# Patient Record
Sex: Female | Born: 1945 | Race: Black or African American | Hispanic: No | Smoking: Former smoker
Health system: Southern US, Community
[De-identification: ages and names within clinical notes are randomized; demographics above are authoritative.]

## PROBLEM LIST (undated history)

## (undated) DIAGNOSIS — E7800 Pure hypercholesterolemia, unspecified: Secondary | ICD-10-CM

## (undated) DIAGNOSIS — I10 Essential (primary) hypertension: Secondary | ICD-10-CM

## (undated) DIAGNOSIS — R519 Headache, unspecified: Secondary | ICD-10-CM

## (undated) DIAGNOSIS — I639 Cerebral infarction, unspecified: Secondary | ICD-10-CM

## (undated) DIAGNOSIS — R51 Headache: Secondary | ICD-10-CM

## (undated) DIAGNOSIS — E119 Type 2 diabetes mellitus without complications: Secondary | ICD-10-CM

## (undated) DIAGNOSIS — R7881 Bacteremia: Secondary | ICD-10-CM

## (undated) DIAGNOSIS — F419 Anxiety disorder, unspecified: Secondary | ICD-10-CM

## (undated) DIAGNOSIS — D649 Anemia, unspecified: Secondary | ICD-10-CM

## (undated) DIAGNOSIS — I96 Gangrene, not elsewhere classified: Secondary | ICD-10-CM

## (undated) DIAGNOSIS — F32A Depression, unspecified: Secondary | ICD-10-CM

## (undated) DIAGNOSIS — L89159 Pressure ulcer of sacral region, unspecified stage: Secondary | ICD-10-CM

## (undated) DIAGNOSIS — Z8489 Family history of other specified conditions: Secondary | ICD-10-CM

## (undated) DIAGNOSIS — F329 Major depressive disorder, single episode, unspecified: Secondary | ICD-10-CM

## (undated) DIAGNOSIS — J189 Pneumonia, unspecified organism: Secondary | ICD-10-CM

## (undated) DIAGNOSIS — G47419 Narcolepsy without cataplexy: Secondary | ICD-10-CM

## (undated) DIAGNOSIS — M719 Bursopathy, unspecified: Secondary | ICD-10-CM

## (undated) HISTORY — DX: Anemia, unspecified: D64.9

## (undated) HISTORY — DX: Pressure ulcer of sacral region, unspecified stage: L89.159

## (undated) HISTORY — DX: Gangrene, not elsewhere classified: I96

## (undated) HISTORY — DX: Essential (primary) hypertension: I10

## (undated) HISTORY — DX: Bacteremia: R78.81

## (undated) HISTORY — DX: Cerebral infarction, unspecified: I63.9

---

## 1999-08-23 ENCOUNTER — Encounter: Payer: Self-pay | Admitting: Internal Medicine

## 1999-08-23 ENCOUNTER — Ambulatory Visit (HOSPITAL_COMMUNITY): Admission: RE | Admit: 1999-08-23 | Discharge: 1999-08-23 | Payer: Self-pay | Admitting: Internal Medicine

## 2001-03-06 ENCOUNTER — Emergency Department (HOSPITAL_COMMUNITY): Admission: EM | Admit: 2001-03-06 | Discharge: 2001-03-06 | Payer: Self-pay | Admitting: Emergency Medicine

## 2004-02-15 ENCOUNTER — Emergency Department (HOSPITAL_COMMUNITY): Admission: EM | Admit: 2004-02-15 | Discharge: 2004-02-16 | Payer: Self-pay | Admitting: Emergency Medicine

## 2006-01-02 ENCOUNTER — Emergency Department (HOSPITAL_COMMUNITY): Admission: EM | Admit: 2006-01-02 | Discharge: 2006-01-02 | Payer: Self-pay | Admitting: Family Medicine

## 2006-01-05 ENCOUNTER — Emergency Department (HOSPITAL_COMMUNITY): Admission: EM | Admit: 2006-01-05 | Discharge: 2006-01-05 | Payer: Self-pay | Admitting: Family Medicine

## 2009-04-21 DIAGNOSIS — J189 Pneumonia, unspecified organism: Secondary | ICD-10-CM

## 2009-04-21 HISTORY — DX: Pneumonia, unspecified organism: J18.9

## 2009-04-27 ENCOUNTER — Ambulatory Visit: Payer: Self-pay | Admitting: Internal Medicine

## 2009-04-27 ENCOUNTER — Ambulatory Visit: Payer: Self-pay | Admitting: Cardiology

## 2009-04-27 ENCOUNTER — Ambulatory Visit: Payer: Self-pay | Admitting: Pulmonary Disease

## 2009-04-27 ENCOUNTER — Inpatient Hospital Stay (HOSPITAL_COMMUNITY): Admission: EM | Admit: 2009-04-27 | Discharge: 2009-05-08 | Payer: Self-pay | Admitting: Emergency Medicine

## 2009-04-30 ENCOUNTER — Encounter (INDEPENDENT_AMBULATORY_CARE_PROVIDER_SITE_OTHER): Payer: Self-pay | Admitting: Internal Medicine

## 2009-05-08 ENCOUNTER — Encounter (INDEPENDENT_AMBULATORY_CARE_PROVIDER_SITE_OTHER): Payer: Self-pay | Admitting: *Deleted

## 2009-05-08 DIAGNOSIS — E1151 Type 2 diabetes mellitus with diabetic peripheral angiopathy without gangrene: Secondary | ICD-10-CM | POA: Insufficient documentation

## 2009-05-08 DIAGNOSIS — F411 Generalized anxiety disorder: Secondary | ICD-10-CM

## 2009-05-08 DIAGNOSIS — I10 Essential (primary) hypertension: Secondary | ICD-10-CM

## 2009-05-17 ENCOUNTER — Ambulatory Visit: Payer: Self-pay | Admitting: Internal Medicine

## 2009-05-17 ENCOUNTER — Encounter (INDEPENDENT_AMBULATORY_CARE_PROVIDER_SITE_OTHER): Payer: Self-pay | Admitting: *Deleted

## 2009-05-17 ENCOUNTER — Encounter: Payer: Self-pay | Admitting: Internal Medicine

## 2009-05-17 DIAGNOSIS — J155 Pneumonia due to Escherichia coli: Secondary | ICD-10-CM

## 2009-05-17 DIAGNOSIS — D649 Anemia, unspecified: Secondary | ICD-10-CM

## 2009-05-18 ENCOUNTER — Encounter: Payer: Self-pay | Admitting: Internal Medicine

## 2009-05-22 DIAGNOSIS — D759 Disease of blood and blood-forming organs, unspecified: Secondary | ICD-10-CM | POA: Insufficient documentation

## 2009-05-22 LAB — CONVERTED CEMR LAB
Alkaline Phosphatase: 69 units/L (ref 39–117)
BUN: 15 mg/dL (ref 6–23)
GFR calc Af Amer: >60 mL/min (ref >60)
GFR calc non Af Amer: >60 mL/min (ref >60)
Glucose, Bld: 147 mg/dL — ABNORMAL HIGH (ref 70–99)
HCT: 38 % (ref 36.0–46.0)
Hemoglobin: 12.6 g/dL (ref 12.0–15.0)
Platelets: 552 10*3/uL — ABNORMAL HIGH (ref 150–400)
Total Bilirubin: 0.3 mg/dL (ref 0.3–1.2)
WBC: 7.6 10*3/uL (ref 4.0–10.5)

## 2009-05-24 ENCOUNTER — Encounter (INDEPENDENT_AMBULATORY_CARE_PROVIDER_SITE_OTHER): Payer: Self-pay | Admitting: *Deleted

## 2009-05-24 ENCOUNTER — Ambulatory Visit: Payer: Self-pay | Admitting: Internal Medicine

## 2009-05-24 LAB — CONVERTED CEMR LAB: Blood Glucose, Home Monitor: 2 mg/dL

## 2009-07-19 ENCOUNTER — Encounter: Payer: Self-pay | Admitting: Internal Medicine

## 2009-08-14 ENCOUNTER — Telehealth (INDEPENDENT_AMBULATORY_CARE_PROVIDER_SITE_OTHER): Payer: Self-pay | Admitting: *Deleted

## 2009-08-30 ENCOUNTER — Ambulatory Visit (HOSPITAL_COMMUNITY): Admission: RE | Admit: 2009-08-30 | Discharge: 2009-08-30 | Payer: Self-pay | Admitting: Internal Medicine

## 2009-08-30 ENCOUNTER — Ambulatory Visit: Payer: Self-pay | Admitting: Internal Medicine

## 2009-08-30 DIAGNOSIS — M25559 Pain in unspecified hip: Secondary | ICD-10-CM

## 2009-08-30 DIAGNOSIS — M25519 Pain in unspecified shoulder: Secondary | ICD-10-CM

## 2009-08-30 DIAGNOSIS — R799 Abnormal finding of blood chemistry, unspecified: Secondary | ICD-10-CM

## 2009-08-30 LAB — CONVERTED CEMR LAB
Basophils Absolute: 0 10*3/uL (ref 0.0–0.1)
Basophils Relative: 0 % (ref 0–1)
Blood Glucose, Fingerstick: 205
Calcium: 9.4 mg/dL (ref 8.4–10.5)
Glucose, Bld: 170 mg/dL — ABNORMAL HIGH (ref 70–99)
Hemoglobin: 13 g/dL (ref 12.0–15.0)
Hgb A1c MFr Bld: 7.1 %
Lymphocytes Relative: 55 % — ABNORMAL HIGH (ref 12–46)
Monocytes Absolute: 0.5 10*3/uL (ref 0.1–1.0)
Neutro Abs: 2.7 10*3/uL (ref 1.7–7.7)
Neutrophils Relative %: 36 % — ABNORMAL LOW (ref 43–77)
Potassium: 4.1 meq/L (ref 3.5–5.3)
RBC: 4.31 M/uL (ref 3.87–5.11)
RDW: 13.2 % (ref 11.5–15.5)
Sodium: 143 meq/L (ref 135–145)

## 2009-09-06 ENCOUNTER — Encounter: Payer: Self-pay | Admitting: Internal Medicine

## 2009-10-22 ENCOUNTER — Telehealth: Payer: Self-pay | Admitting: Internal Medicine

## 2011-03-28 LAB — GLUCOSE, CAPILLARY: Glucose-Capillary: 205 mg/dL — ABNORMAL HIGH (ref 70–99)

## 2011-04-01 LAB — COMPREHENSIVE METABOLIC PANEL WITH GFR
ALT: 15 U/L (ref 0–35)
AST: 27 U/L (ref 0–37)
Albumin: 3.4 g/dL — ABNORMAL LOW (ref 3.5–5.2)
Alkaline Phosphatase: 86 U/L (ref 39–117)
BUN: 11 mg/dL (ref 6–23)
CO2: 23 meq/L (ref 19–32)
Calcium: 8.7 mg/dL (ref 8.4–10.5)
Chloride: 99 meq/L (ref 96–112)
Creatinine, Ser: 0.9 mg/dL (ref 0.4–1.2)
GFR calc non Af Amer: >60 mL/min
Glucose, Bld: 408 mg/dL — ABNORMAL HIGH (ref 70–99)
Potassium: 3.3 meq/L — ABNORMAL LOW (ref 3.5–5.1)
Sodium: 135 meq/L (ref 135–145)
Total Bilirubin: 1.3 mg/dL — ABNORMAL HIGH (ref 0.3–1.2)
Total Protein: 6.7 g/dL (ref 6.0–8.3)

## 2011-04-01 LAB — BASIC METABOLIC PANEL
BUN: 8 mg/dL (ref 6–23)
BUN: 10 mg/dL (ref 6–23)
BUN: 10 mg/dL (ref 6–23)
BUN: 11 mg/dL (ref 6–23)
BUN: 13 mg/dL (ref 6–23)
CO2: 30 mEq/L (ref 19–32)
CO2: 26 mEq/L (ref 19–32)
CO2: 26 mEq/L (ref 19–32)
CO2: 28 mEq/L (ref 19–32)
Calcium: 8.9 mg/dL (ref 8.4–10.5)
Calcium: 8 mg/dL — ABNORMAL LOW (ref 8.4–10.5)
Calcium: 8 mg/dL — ABNORMAL LOW (ref 8.4–10.5)
Calcium: 8.3 mg/dL — ABNORMAL LOW (ref 8.4–10.5)
Chloride: 96 mEq/L (ref 96–112)
Creatinine, Ser: 0.47 mg/dL (ref 0.4–1.2)
Creatinine, Ser: 0.59 mg/dL (ref 0.4–1.2)
Creatinine, Ser: 0.59 mg/dL (ref 0.4–1.2)
Creatinine, Ser: 0.73 mg/dL (ref 0.4–1.2)
GFR calc Af Amer: >60 mL/min (ref >60)
GFR calc Af Amer: >60 mL/min (ref >60)
GFR calc Af Amer: >60 mL/min (ref >60)
GFR calc Af Amer: >60 mL/min (ref >60)
GFR calc non Af Amer: >60 mL/min (ref >60)
GFR calc non Af Amer: >60 mL/min (ref >60)
GFR calc non Af Amer: >60 mL/min (ref >60)
GFR calc non Af Amer: >60 mL/min (ref >60)
GFR calc non Af Amer: >60 mL/min (ref >60)
GFR calc non Af Amer: >60 mL/min (ref >60)
GFR calc non Af Amer: >60 mL/min (ref >60)
Glucose, Bld: 116 mg/dL — ABNORMAL HIGH (ref 70–99)
Glucose, Bld: 124 mg/dL — ABNORMAL HIGH (ref 70–99)
Glucose, Bld: 142 mg/dL — ABNORMAL HIGH (ref 70–99)
Glucose, Bld: 147 mg/dL — ABNORMAL HIGH (ref 70–99)
Glucose, Bld: 149 mg/dL — ABNORMAL HIGH (ref 70–99)
Potassium: 4.7 mEq/L (ref 3.5–5.1)
Potassium: 3.2 mEq/L — ABNORMAL LOW (ref 3.5–5.1)
Potassium: 3.5 mEq/L (ref 3.5–5.1)
Potassium: 3.6 mEq/L (ref 3.5–5.1)
Potassium: 3.7 mEq/L (ref 3.5–5.1)
Potassium: 3.7 mEq/L (ref 3.5–5.1)
Sodium: 134 mEq/L — ABNORMAL LOW (ref 135–145)
Sodium: 137 mEq/L (ref 135–145)
Sodium: 139 mEq/L (ref 135–145)
Sodium: 140 mEq/L (ref 135–145)
Sodium: 142 mEq/L (ref 135–145)

## 2011-04-01 LAB — DIFFERENTIAL
Basophils Absolute: 0 10*3/uL (ref 0.0–0.1)
Basophils Absolute: 0 10*3/uL (ref 0.0–0.1)
Basophils Absolute: 0.1 K/uL (ref 0.0–0.1)
Basophils Relative: 0 % (ref 0–1)
Basophils Relative: 1 % (ref 0–1)
Eosinophils Absolute: 0 K/uL (ref 0.0–0.7)
Eosinophils Absolute: 0.3 10*3/uL (ref 0.0–0.7)
Eosinophils Relative: 0 % (ref 0–5)
Eosinophils Relative: 1 % (ref 0–5)
Eosinophils Relative: 2 % (ref 0–5)
Lymphocytes Relative: 4 % — ABNORMAL LOW (ref 12–46)
Lymphs Abs: 0.7 K/uL (ref 0.7–4.0)
Monocytes Absolute: 0.4 K/uL (ref 0.1–1.0)
Monocytes Absolute: 1.9 10*3/uL — ABNORMAL HIGH (ref 0.1–1.0)
Monocytes Relative: 2 % — ABNORMAL LOW (ref 3–12)
Monocytes Relative: 4 % (ref 3–12)
Neutro Abs: 17.2 K/uL — ABNORMAL HIGH (ref 1.7–7.7)
Neutrophils Relative %: 86 % — ABNORMAL HIGH (ref 43–77)
Neutrophils Relative %: 93 % — ABNORMAL HIGH (ref 43–77)

## 2011-04-01 LAB — CARDIAC PANEL(CRET KIN+CKTOT+MB+TROPI)
CK, MB: 1.6 ng/mL (ref 0.3–4.0)
CK, MB: 2.2 ng/mL (ref 0.3–4.0)
Relative Index: 1.5 (ref 0.0–2.5)
Total CK: 112 U/L (ref 7–177)
Total CK: 131 U/L (ref 7–177)
Total CK: 146 U/L (ref 7–177)
Total CK: 69 U/L (ref 7–177)
Troponin I: 0.04 ng/mL (ref 0.00–0.06)
Troponin I: 0.12 ng/mL — ABNORMAL HIGH (ref 0.00–0.06)

## 2011-04-01 LAB — URINALYSIS, ROUTINE W REFLEX MICROSCOPIC
Glucose, UA: 250 mg/dL — AB
Glucose, UA: >1000 mg/dL — AB
Hgb urine dipstick: NEGATIVE
Hgb urine dipstick: NEGATIVE
Ketones, ur: 15 mg/dL — AB
Ketones, ur: 15 mg/dL — AB
Leukocytes, UA: NEGATIVE
Leukocytes, UA: NEGATIVE
Nitrite: NEGATIVE
Nitrite: NEGATIVE
Protein, ur: 30 mg/dL — AB
Protein, ur: 30 mg/dL — AB
Specific Gravity, Urine: 1.026 (ref 1.005–1.030)
Specific Gravity, Urine: 1.035 — ABNORMAL HIGH (ref 1.005–1.030)
Urobilinogen, UA: 1 mg/dL (ref 0.0–1.0)
Urobilinogen, UA: 1 mg/dL (ref 0.0–1.0)
pH: 6 (ref 5.0–8.0)
pH: 6.5 (ref 5.0–8.0)

## 2011-04-01 LAB — CBC
HCT: 23.2 % — ABNORMAL LOW (ref 36.0–46.0)
HCT: 33.5 % — ABNORMAL LOW (ref 36.0–46.0)
HCT: 33.6 % — ABNORMAL LOW (ref 36.0–46.0)
HCT: 29.7 % — ABNORMAL LOW (ref 36.0–46.0)
HCT: 31.1 % — ABNORMAL LOW (ref 36.0–46.0)
HCT: 31.2 % — ABNORMAL LOW (ref 36.0–46.0)
HCT: 31.6 % — ABNORMAL LOW (ref 36.0–46.0)
HCT: 36.4 % (ref 36.0–46.0)
HCT: 39.9 % (ref 36.0–46.0)
Hemoglobin: 11.7 g/dL — ABNORMAL LOW (ref 12.0–15.0)
Hemoglobin: 8.1 g/dL — ABNORMAL LOW (ref 12.0–15.0)
Hemoglobin: 10.3 g/dL — ABNORMAL LOW (ref 12.0–15.0)
Hemoglobin: 11 g/dL — ABNORMAL LOW (ref 12.0–15.0)
Hemoglobin: 11.3 g/dL — ABNORMAL LOW (ref 12.0–15.0)
Hemoglobin: 13.9 g/dL (ref 12.0–15.0)
MCHC: 34.7 g/dL (ref 30.0–36.0)
MCHC: 34.6 g/dL (ref 30.0–36.0)
MCHC: 34.8 g/dL (ref 30.0–36.0)
MCHC: 34.9 g/dL (ref 30.0–36.0)
MCV: 88.1 fL (ref 78.0–100.0)
MCV: 91.1 fL (ref 78.0–100.0)
MCV: 90.7 fL (ref 78.0–100.0)
MCV: 91.9 fL (ref 78.0–100.0)
Platelets: 385 10*3/uL (ref 150–400)
Platelets: 402 10*3/uL — ABNORMAL HIGH (ref 150–400)
Platelets: 402 K/uL — ABNORMAL HIGH (ref 150–400)
Platelets: 216 10*3/uL (ref 150–400)
Platelets: 223 10*3/uL (ref 150–400)
Platelets: 234 10*3/uL (ref 150–400)
Platelets: 244 K/uL (ref 150–400)
Platelets: 306 10*3/uL (ref 150–400)
RBC: 2.63 MIL/uL — ABNORMAL LOW (ref 3.87–5.11)
RBC: 3.69 MIL/uL — ABNORMAL LOW (ref 3.87–5.11)
RBC: 3.85 MIL/uL — ABNORMAL LOW (ref 3.87–5.11)
RBC: 3.44 MIL/uL — ABNORMAL LOW (ref 3.87–5.11)
RBC: 3.47 MIL/uL — ABNORMAL LOW (ref 3.87–5.11)
RBC: 3.53 MIL/uL — ABNORMAL LOW (ref 3.87–5.11)
RBC: 4.4 MIL/uL (ref 3.87–5.11)
RDW: 12.7 % (ref 11.5–15.5)
RDW: 14.6 % (ref 11.5–15.5)
RDW: 12.4 % (ref 11.5–15.5)
RDW: 12.8 % (ref 11.5–15.5)
RDW: 12.9 % (ref 11.5–15.5)
RDW: 13 % (ref 11.5–15.5)
RDW: 13.3 % (ref 11.5–15.5)
RDW: 13.5 % (ref 11.5–15.5)
RDW: 13.5 % (ref 11.5–15.5)
RDW: 13.5 % (ref 11.5–15.5)
WBC: 10.5 10*3/uL (ref 4.0–10.5)
WBC: 11.6 10*3/uL — ABNORMAL HIGH (ref 4.0–10.5)
WBC: 13.6 10*3/uL — ABNORMAL HIGH (ref 4.0–10.5)
WBC: 9.3 K/uL (ref 4.0–10.5)
WBC: 11.2 10*3/uL — ABNORMAL HIGH (ref 4.0–10.5)
WBC: 11.3 10*3/uL — ABNORMAL HIGH (ref 4.0–10.5)
WBC: 14.7 10*3/uL — ABNORMAL HIGH (ref 4.0–10.5)
WBC: 18.5 K/uL — ABNORMAL HIGH (ref 4.0–10.5)

## 2011-04-01 LAB — BLOOD GAS, ARTERIAL
Acid-Base Excess: 3 mmol/L — ABNORMAL HIGH (ref 0.0–2.0)
Acid-base deficit: 0.5 mmol/L (ref 0.0–2.0)
Acid-base deficit: 2.1 mmol/L — ABNORMAL HIGH (ref 0.0–2.0)
Acid-base deficit: 2.6 mmol/L — ABNORMAL HIGH (ref 0.0–2.0)
Acid-base deficit: 2.8 mmol/L — ABNORMAL HIGH (ref 0.0–2.0)
Bicarbonate: 21.2 mEq/L (ref 20.0–24.0)
Bicarbonate: 21.7 mEq/L (ref 20.0–24.0)
Bicarbonate: 21.8 mEq/L (ref 20.0–24.0)
Bicarbonate: 21.9 mEq/L (ref 20.0–24.0)
Bicarbonate: 22.8 mEq/L (ref 20.0–24.0)
Bicarbonate: 23.7 mEq/L (ref 20.0–24.0)
Bicarbonate: 25.3 mEq/L — ABNORMAL HIGH (ref 20.0–24.0)
Bicarbonate: 27 mEq/L — ABNORMAL HIGH (ref 20.0–24.0)
Drawn by: 29757
FIO2: 0.3 %
FIO2: 0.4 %
FIO2: 0.5 %
FIO2: 1 %
FIO2: 100 %
MECHVT: 380 mL
MECHVT: 450 mL
O2 Saturation: 91.3 %
O2 Saturation: 94.2 %
O2 Saturation: 95.2 %
O2 Saturation: 98.3 %
O2 Saturation: 99.2 %
O2 Saturation: 99.5 %
O2 Saturation: 99.6 %
O2 Saturation: 99.8 %
PEEP: 10 cmH2O
PEEP: 10 cmH2O
PEEP: 5 cmH2O
PEEP: 5 cmH2O
PEEP: 5 cmH2O
Patient temperature: 100.8
Patient temperature: 98.6
Patient temperature: 99.9
RATE: 20 resp/min
RATE: 23 resp/min
TCO2: 22.3 mmol/L (ref 0–100)
TCO2: 23 mmol/L (ref 0–100)
TCO2: 23.2 mmol/L (ref 0–100)
TCO2: 26.5 mmol/L (ref 0–100)
TCO2: 28.2 mmol/L (ref 0–100)
pCO2 arterial: 38.3 mmHg (ref 35.0–45.0)
pCO2 arterial: 38.3 mmHg (ref 35.0–45.0)
pCO2 arterial: 39.3 mmHg (ref 35.0–45.0)
pCO2 arterial: 41 mmHg (ref 35.0–45.0)
pH, Arterial: 7.347 — ABNORMAL LOW (ref 7.350–7.400)
pH, Arterial: 7.366 (ref 7.350–7.400)
pH, Arterial: 7.373 (ref 7.350–7.400)
pH, Arterial: 7.395 (ref 7.350–7.400)
pH, Arterial: 7.435 — ABNORMAL HIGH (ref 7.350–7.400)
pO2, Arterial: 148 mmHg — ABNORMAL HIGH (ref 80.0–100.0)
pO2, Arterial: 183 mmHg — ABNORMAL HIGH (ref 80.0–100.0)
pO2, Arterial: 58.5 mmHg — ABNORMAL LOW (ref 80.0–100.0)
pO2, Arterial: 72.8 mmHg — ABNORMAL LOW (ref 80.0–100.0)
pO2, Arterial: 74 mmHg — ABNORMAL LOW (ref 80.0–100.0)
pO2, Arterial: 96.2 mmHg (ref 80.0–100.0)

## 2011-04-01 LAB — GLUCOSE, CAPILLARY
Glucose-Capillary: 102 mg/dL — ABNORMAL HIGH (ref 70–99)
Glucose-Capillary: 126 mg/dL — ABNORMAL HIGH (ref 70–99)
Glucose-Capillary: 134 mg/dL — ABNORMAL HIGH (ref 70–99)
Glucose-Capillary: 135 mg/dL — ABNORMAL HIGH (ref 70–99)
Glucose-Capillary: 144 mg/dL — ABNORMAL HIGH (ref 70–99)
Glucose-Capillary: 158 mg/dL — ABNORMAL HIGH (ref 70–99)
Glucose-Capillary: 170 mg/dL — ABNORMAL HIGH (ref 70–99)
Glucose-Capillary: 178 mg/dL — ABNORMAL HIGH (ref 70–99)
Glucose-Capillary: 179 mg/dL — ABNORMAL HIGH (ref 70–99)
Glucose-Capillary: 191 mg/dL — ABNORMAL HIGH (ref 70–99)
Glucose-Capillary: 194 mg/dL — ABNORMAL HIGH (ref 70–99)
Glucose-Capillary: 205 mg/dL — ABNORMAL HIGH (ref 70–99)
Glucose-Capillary: 115 mg/dL — ABNORMAL HIGH (ref 70–99)
Glucose-Capillary: 131 mg/dL — ABNORMAL HIGH (ref 70–99)
Glucose-Capillary: 133 mg/dL — ABNORMAL HIGH (ref 70–99)
Glucose-Capillary: 137 mg/dL — ABNORMAL HIGH (ref 70–99)
Glucose-Capillary: 137 mg/dL — ABNORMAL HIGH (ref 70–99)
Glucose-Capillary: 140 mg/dL — ABNORMAL HIGH (ref 70–99)
Glucose-Capillary: 144 mg/dL — ABNORMAL HIGH (ref 70–99)
Glucose-Capillary: 148 mg/dL — ABNORMAL HIGH (ref 70–99)
Glucose-Capillary: 149 mg/dL — ABNORMAL HIGH (ref 70–99)
Glucose-Capillary: 154 mg/dL — ABNORMAL HIGH (ref 70–99)
Glucose-Capillary: 154 mg/dL — ABNORMAL HIGH (ref 70–99)
Glucose-Capillary: 154 mg/dL — ABNORMAL HIGH (ref 70–99)
Glucose-Capillary: 156 mg/dL — ABNORMAL HIGH (ref 70–99)
Glucose-Capillary: 157 mg/dL — ABNORMAL HIGH (ref 70–99)
Glucose-Capillary: 162 mg/dL — ABNORMAL HIGH (ref 70–99)
Glucose-Capillary: 163 mg/dL — ABNORMAL HIGH (ref 70–99)
Glucose-Capillary: 163 mg/dL — ABNORMAL HIGH (ref 70–99)
Glucose-Capillary: 165 mg/dL — ABNORMAL HIGH (ref 70–99)
Glucose-Capillary: 168 mg/dL — ABNORMAL HIGH (ref 70–99)
Glucose-Capillary: 172 mg/dL — ABNORMAL HIGH (ref 70–99)
Glucose-Capillary: 175 mg/dL — ABNORMAL HIGH (ref 70–99)
Glucose-Capillary: 177 mg/dL — ABNORMAL HIGH (ref 70–99)
Glucose-Capillary: 178 mg/dL — ABNORMAL HIGH (ref 70–99)
Glucose-Capillary: 178 mg/dL — ABNORMAL HIGH (ref 70–99)
Glucose-Capillary: 179 mg/dL — ABNORMAL HIGH (ref 70–99)
Glucose-Capillary: 180 mg/dL — ABNORMAL HIGH (ref 70–99)
Glucose-Capillary: 185 mg/dL — ABNORMAL HIGH (ref 70–99)
Glucose-Capillary: 187 mg/dL — ABNORMAL HIGH (ref 70–99)
Glucose-Capillary: 187 mg/dL — ABNORMAL HIGH (ref 70–99)
Glucose-Capillary: 194 mg/dL — ABNORMAL HIGH (ref 70–99)
Glucose-Capillary: 196 mg/dL — ABNORMAL HIGH (ref 70–99)
Glucose-Capillary: 202 mg/dL — ABNORMAL HIGH (ref 70–99)
Glucose-Capillary: 209 mg/dL — ABNORMAL HIGH (ref 70–99)
Glucose-Capillary: 211 mg/dL — ABNORMAL HIGH (ref 70–99)
Glucose-Capillary: 221 mg/dL — ABNORMAL HIGH (ref 70–99)
Glucose-Capillary: 242 mg/dL — ABNORMAL HIGH (ref 70–99)
Glucose-Capillary: 247 mg/dL — ABNORMAL HIGH (ref 70–99)
Glucose-Capillary: 259 mg/dL — ABNORMAL HIGH (ref 70–99)
Glucose-Capillary: 389 mg/dL — ABNORMAL HIGH (ref 70–99)

## 2011-04-01 LAB — AMYLASE: Amylase: 30 U/L (ref 27–131)

## 2011-04-01 LAB — TROPONIN I: Troponin I: 0.01 ng/mL (ref 0.00–0.06)

## 2011-04-01 LAB — LIPID PANEL
Cholesterol: 144 mg/dL (ref 0–200)
HDL: 23 mg/dL — ABNORMAL LOW (ref >39)
Triglycerides: 293 mg/dL — ABNORMAL HIGH (ref <150)

## 2011-04-01 LAB — COMPREHENSIVE METABOLIC PANEL
ALT: 14 U/L (ref 0–35)
AST: 13 U/L (ref 0–37)
AST: 14 U/L (ref 0–37)
AST: 22 U/L (ref 0–37)
Albumin: 2.1 g/dL — ABNORMAL LOW (ref 3.5–5.2)
Albumin: 1.8 g/dL — ABNORMAL LOW (ref 3.5–5.2)
Albumin: 2.4 g/dL — ABNORMAL LOW (ref 3.5–5.2)
Alkaline Phosphatase: 75 U/L (ref 39–117)
BUN: 7 mg/dL (ref 6–23)
BUN: 10 mg/dL (ref 6–23)
Calcium: 7.9 mg/dL — ABNORMAL LOW (ref 8.4–10.5)
Calcium: 8 mg/dL — ABNORMAL LOW (ref 8.4–10.5)
Chloride: 107 mEq/L (ref 96–112)
Creatinine, Ser: 0.59 mg/dL (ref 0.4–1.2)
Creatinine, Ser: 0.5 mg/dL (ref 0.4–1.2)
GFR calc Af Amer: >60 mL/min (ref >60)
GFR calc Af Amer: >60 mL/min (ref >60)
GFR calc Af Amer: >60 mL/min (ref >60)
Potassium: 3.4 mEq/L — ABNORMAL LOW (ref 3.5–5.1)
Potassium: 3.5 mEq/L (ref 3.5–5.1)
Sodium: 135 mEq/L (ref 135–145)
Total Protein: 6.6 g/dL (ref 6.0–8.3)
Total Protein: 5.6 g/dL — ABNORMAL LOW (ref 6.0–8.3)
Total Protein: 6.2 g/dL (ref 6.0–8.3)

## 2011-04-01 LAB — URINE MICROSCOPIC-ADD ON

## 2011-04-01 LAB — CULTURE, RESPIRATORY W GRAM STAIN: Culture: NO GROWTH

## 2011-04-01 LAB — POCT I-STAT 3, ART BLOOD GAS (G3+)
Acid-base deficit: 2 mmol/L (ref 0.0–2.0)
O2 Saturation: 83 %

## 2011-04-01 LAB — CULTURE, BLOOD (ROUTINE X 2): Culture: NO GROWTH

## 2011-04-01 LAB — LEGIONELLA ANTIGEN, URINE

## 2011-04-01 LAB — MAGNESIUM
Magnesium: 1.4 mg/dL — ABNORMAL LOW (ref 1.5–2.5)
Magnesium: 2 mg/dL (ref 1.5–2.5)

## 2011-04-01 LAB — URINE CULTURE: Culture: NO GROWTH

## 2011-04-01 LAB — TSH: TSH: 0.253 u[IU]/mL — ABNORMAL LOW (ref 0.350–4.500)

## 2011-04-01 LAB — LIPASE, BLOOD
Lipase: 13 U/L (ref 11–59)
Lipase: <10 U/L — ABNORMAL LOW (ref 11–59)

## 2011-04-01 LAB — CK TOTAL AND CKMB (NOT AT ARMC): CK, MB: 1 ng/mL (ref 0.3–4.0)

## 2011-04-01 LAB — APTT: aPTT: 35 seconds (ref 24–37)

## 2011-04-01 LAB — HEMOCCULT GUIAC POC 1CARD (OFFICE): Fecal Occult Bld: NEGATIVE

## 2011-04-01 LAB — HEMOGLOBIN A1C: Hgb A1c MFr Bld: 11.1 % — ABNORMAL HIGH (ref 4.6–6.1)

## 2011-04-01 LAB — BRAIN NATRIURETIC PEPTIDE: Pro B Natriuretic peptide (BNP): 144 pg/mL — ABNORMAL HIGH (ref 0.0–100.0)

## 2011-04-01 LAB — PROTIME-INR: Prothrombin Time: 15.6 seconds — ABNORMAL HIGH (ref 11.6–15.2)

## 2011-05-06 NOTE — Discharge Summary (Signed)
Laurie [name], Laurie [name] NO.:  [id number]   MEDICAL RECORD NO.:  [id number]          PATIENT TYPE:  INP   LOCATION:  2005                         FACILITY:  [facility]   PHYSICIAN:  Alvester Morin, M.D.  DATE OF BIRTH:  [birth year]   DATE OF ADMISSION:  04/27/2009  DATE OF DISCHARGE:  05/08/2009                               DISCHARGE SUMMARY   CONTINUITY DOCTOR:  Hartley Barefoot, MD, Outpatient [facility].   CONSULTANTS:  CCM for ventilator management, and Cardiology.   DISCHARGE DIAGNOSES:  1. Community-acquired pneumonia.  2. Gram-negative bacteremia.   OTHER DIAGNOSES:  1. Hypertension.  2. Diabetes type 2.  3. History of narcolepsy.   DISCHARGE MEDICATIONS:  1. Aspirin 81 mg p.o. daily.  2. Lisinopril 10 mg daily.  3. Colace 100 mg once a day for constipation.  4. Metoprolol 50 mg twice a day.  5. Metformin 500 mg twice a day.   DISPOSITION AND FOLLOWUP:  Laurie [name] will follow at the Outpatient  Clinic with Dr. Sunnie Nielsen.  During that appointment, we need to refer her  to Cardiology for a Myoview.  We need to control her diabetes and  reassess her pulmonary symptoms.  We need to do smoking cessation  counseling.  We need to check hemoglobin to follow anemia.   PROCEDURES PERFORMED:  1. CT abdomen and pelvis, no acute findings.  Uterus and ovaries are      normal.  No free air.  No acute abnormality of the abdomen.  2. A 2-D echo, the left ventricular cavity size was normal.  There was      mild focal basal and mild concentric hypertrophy of the septum.      Systolic function was normal.  Ejection fraction was 55-60%, wall      motion was normal.  There were no evidence of wall motion      abnormality.   HISTORY OF PRESENT ILLNESS:  This is a 65 year old with past medical  history of hypertension, diabetes, current smoker, not on medication,  she does not have a primary care doctor who presented to the emergency  department complaining of chest pain and  shortness of breath.  The  patient was giving her mom a bath the night prior to admission when she  suddenly developed weakness and sharp right side chest pain 10/10 in  intensity.  She also relates shortness of breath.  She relates that the  symptoms were similar as those when she had pneumonia in the 90s.  She  relates that the chest pain is not relieved by anything.  It is worse  with movement and with breathing.  She relates some chills.  She denies  nausea, vomiting, no sore throat.  She relates some cough.  She had a  sick contact.  Her sister had pneumonia 2-3 weeks ago.   ALLERGIES:  She has allergies to PENICILLIN.  She gets rash.   PHYSICAL EXAMINATION:  VITAL SIGNS:  Temperature 99.2, blood pressure  156/62, pulse 118, respirations 20, and saturations 93 on room air.  GENERAL:  The patient is in distress secondary  to chest pain, speaking  in full sentences.  EYES:  Pupils equal and reactive to light.  Extraocular muscle intact.  ENT:  Clear oropharynx.  Dry mucous membranes.  No exudate.  NECK:  Supple.  No thyromegaly.  RESPIRATION:  Shallow breath, crackles in the right side.  The patient  is not using accessory muscle to breathe, no wheezing.  CARDIOVASCULAR:  S1 and S2.  Tachycardic, no murmur.  GASTROINTESTINAL:  Bowel sounds present, soft, nontender, and  nondistended.  No rigidity.  EXTREMITY:  No edema.  SKIN:  Moist, diaphoretic.  MUSCULOSKELETAL:  No joint deformity.  NEURO:  Nonfocal.  PSYCH:  Appropriate.   LABORATORY DATA:  Sodium 135, potassium 3.3, chloride 99, bicarb 23, BUN  11, creatinine 0.9, glucose 408, lipase 13.  Chest x-ray, right upper  lobe pneumonia early involvement of the right lower lobe.  White blood  cell 18.5, hemoglobin 13.9, platelets 244, ANC 93%, 17.2, MCV 90, anion  gap 13, bilirubin 1.3, alkaline phosphatase 86, AST 27, ALT 15, protein  6.7, albumin 3.4, and calcium 8.7.   PROBLEMS:  1. Right upper lobe pneumonia.  The patient  was admitted to telemetry.      She was started on azithromycin and ceftriaxone.  Sputum culture      was ordered and then grew E. coli.  On the same day of      hospitalization, the patient was transferred to the step-down unit      due to respiratory distress.  On the third day of hospitalization      on Apr 30, 2009, the patient required intubation due to worsening      respiratory distress.  Her ABG at that moment was pH 7.39, PCO2 38,      and PO2 58.  The patient remained intubated for 4 days, on May 04, 2009 she was extubated.  Ceftriaxone was changed to cefepime to      cover Pseudomonas.  The patient's completed a course of antibiotics      for more than 7 days.  Her E. coli pneumonia will be secondary to      aspiration, unclear if the history of narcolepsy has predisposed      the patient to aspiration.   1. Bacteremia.  The patient had two sets of blood culture that grew      gram-negative rods, E. coli.  E. coli was sensitive to ceftriaxone      and cefepime.  The patient's urine was clean.  Repeat blood      cultures were negative.  No growth.  The patient was treated with      antibiotics for 7 days for bacteremia.   1. Chest pain is most likely secondary to pneumonia.  EKG without      changes.  The patient did have increased troponin during this      admission in the range from 0.08 increased to 0.12.  This was      likely stress-related demand ischemia.  We cannot explain increased      troponin by renal failure.  2-D echo was ordered and it was      negative for wall motion abnormality.  Cardiology was consulted.      They recommended a Myoview when the patient is more  stable.  Her      EKG was without any changes.  The patient was started on metoprolol      and aspirin.  Lipid  profile, cholesterol 144, HDL 23, LDL 62.  She      may need to be started as an outpatient on niacin for low HDL.   1. Diabetes.  Her diabetes was controlled during this admission.   Her      blood sugar ranged from 126 to 194.  She was on basal insulin,      Lantus, and a sliding scale.  She was discharged on metformin 500      mg twice a day.  We will need to readdress her diabetes regimen.      Her hemoglobin A1c was 11.1.  She will need a second hypoglycemic      medication.  2. Hypertension.  Her blood pressure was controlled with metoprolol.      She will benefit of an ACE for renal protection due to her history      of diabetes.   The patient was discharged in good condition.Her sat was 95 on room air.  Blood pressure 148/88, respirations 20, pulse 80, and temperature 98.2.  The patient was feeling better.  No shortness of breath.  White blood  cell 10.5, hemoglobin 11.7, platelets 398.  BMET, sodium 134, potassium  4.7, chloride 96, bicarb 30, glucose 116, BUN 11, and creatinine 0.63.      Hartley Barefoot, MD  Electronically Signed      Alvester Morin, M.D.  Electronically Signed    BR/MEDQ  D:  05/14/2009  T:  05/15/2009  Job:  [id number]

## 2011-05-06 NOTE — Consult Note (Signed)
Laurie [name], [name] NO.:  [id number]   MEDICAL RECORD NO.:  [id number]          PATIENT TYPE:  INP   LOCATION:  2915                         FACILITY:  [facility]   PHYSICIAN:  Luis Abed, MD, FACCDATE OF BIRTH:  01/04/1946   DATE OF CONSULTATION:  04/28/2009  DATE OF DISCHARGE:                                 CONSULTATION   PRIMARY CARE PHYSICIAN:  None.   PRIMARY CARDIOLOGIST:  Luis Abed, MD, [facility].   CHIEF COMPLAINT:  Elevated cardiac enzymes/chest pain.   HISTORY OF PRESENT ILLNESS:  Laurie [name] is a 65 year old female with no  previous history of coronary artery disease.  She had onset on Apr 26, 2009, a right-sided chest pain.  It radiated through to her back.  It  reached to an 8/10 and went to a 10/10 with cough.  The chest pain was  associated with shortness of breath and weakness.  These symptoms were  worsened by cough and deep inspiration.  She did not try any  medications.  She came to the hospital and was diagnosed with pneumonia.  She was placed on dual antibiotic therapy and cardiac enzymes were  cycled.  Her troponins shows an increase and so Cardiology was asked to  evaluate her.   Laurie [name] has no history of exertional chest pain.  This is her third  episode of pneumonia, although she has not had any pneumonia recently.  She has not had this type of chest pain before.   PAST MEDICAL HISTORY:  1. Diabetes.  2. Hypertension.   FAMILY HISTORY:  1. Premature coronary artery disease.  2. Ongoing tobacco use.  3. Anxiety/depression.  4. Remote history of pneumonia.   SURGICAL HISTORY:  None.   ALLERGIES:  PENICILLIN.   MEDICATIONS PRIOR TO ADMISSION:  None.   CURRENT MEDICATIONS:  1. Zithromax 500 mg IV daily.  2. Cefepime 2 g IV q.12 h.  3. Sliding-scale insulin.  4. Magnesium sulfate x1.  5. MS Contin 30 mg p.o. q.12 h.  6. Zocor 40 mg x1.  7. Heparin per pharmacy.   SOCIAL HISTORY:  She lives in [place] with her  sister and works at  [employer].  She has an approximately 20-pack-year history of ongoing  tobacco use but denies alcohol or drug abuse.   FAMILY HISTORY:  Her mother is alive at age 14 with a history of heart  disease and her father died with a history of coronary artery disease.  She also has at least 2 siblings with known coronary artery disease.   REVIEW OF SYSTEMS:  She has had fevers, chills, and sweats, although  they are recent onset.  The chest pain is described above, and she has  had shortness of breath, dyspnea on exertion, and some orthopnea, but  denies PND, edema, or palpitations.  She has had cough but no wheezing.  She has had weakness.  She has occasional arthralgias.  She denies  reflux symptoms and full 14-point review of systems is otherwise  negative.   PHYSICAL EXAMINATION:  VITAL SIGNS:  Temperature is 98.5, blood pressure  128/72, pulse 98, respiratory rate 28, O2 saturation is 95% on a non-  rebreather.  GENERAL:  She is a well-developed [place] female who is short  of breath.  HEENT:  Normal.  NECK:  There is no lymphadenopathy, thyromegaly, bruit, or JVD noted.  CV:  Her heart is regular rate and rhythm with an S1, S2 and no  significant murmur, rub, or gallop is noted.  Distal pulses are intact  in all 4 extremities.  LUNGS:  She has bronchial breath sounds as well as decreased breath  sounds in the right lung and pneumonia is heard in the right upper lobe.  SKIN:  No rashes or lesions are noted.  ABDOMEN:  Soft, nontender with active bowel sounds.  EXTREMITIES:  There is no cyanosis, clubbing, or edema noted.  MUSCULOSKELETAL:  There is no joint deformity or effusions.  There is no  spine or CVA tenderness.  NEURO:  She is alert and oriented.  Cranial nerves II through XII  grossly intact.   Chest x-ray compared to yesterday shows progression as well as increased  size and density of her right upper lobe pneumonia with internal air   bronchograms present and a consolidation extends all the way from the  hilum to the pleural surface.  Probable component of small right pleural  effusion.   EKG, sinus tachycardia rate 104 with no acute ischemic changes.   Laboratory values; TSH 0.253, hemoglobin A1c 11.1, WBCs 14.7, hemoglobin  12.5, hematocrit 36.4, platelets 223.  Sodium 136, potassium 3.7,  chloride 105, CO2 23, BUN 11, creatinine 0.62, glucose 260.  CK-MB  negative x4.  TSH initially 0.01 and troponin I #4, 0.24.   IMPRESSION:  Laurie [name] was seen today by Dr. Myrtis Ser.  1. Chest pain:  This is felt secondary to pneumonia and appropriate      therapy with pain control medications has been initiated by primary      care.  2. Elevated troponin:  This is felt secondary to her pulmonary      illness.  The plan is therefore to treat the pneumonia, but we will      order an echocardiogram to assess her left ventricular function.      We will add baby aspirin to her medication regimen.  A Myoview was      indicated to assess for coronary ischemia, but this can be      performed in a later date after she recovers from her current      illness, and the patient is under otherwise excellent care of the      teaching service, and we will continue to follow along.  It would      also be appropriate to check a free T3 and T4 since her TSH is low.      We will order a lipid profile as well.      Theodore Demark, PA-C      Luis Abed, MD, [facility]  Electronically Signed    RB/MEDQ  D:  04/28/2009  T:  04/29/2009  Job:  [id number]

## 2011-05-15 ENCOUNTER — Encounter: Payer: Self-pay | Admitting: Internal Medicine

## 2011-05-15 DIAGNOSIS — R7881 Bacteremia: Secondary | ICD-10-CM | POA: Insufficient documentation

## 2011-07-05 ENCOUNTER — Encounter: Payer: Self-pay | Admitting: Internal Medicine

## 2011-09-02 ENCOUNTER — Emergency Department (HOSPITAL_COMMUNITY): Payer: Self-pay

## 2011-09-02 ENCOUNTER — Emergency Department (HOSPITAL_COMMUNITY)
Admission: EM | Admit: 2011-09-02 | Discharge: 2011-09-02 | Disposition: A | Discharge status: Home / Self Care | Payer: Self-pay | Attending: Emergency Medicine | Admitting: Emergency Medicine

## 2011-09-02 DIAGNOSIS — H5710 Ocular pain, unspecified eye: Secondary | ICD-10-CM | POA: Insufficient documentation

## 2011-09-02 DIAGNOSIS — E119 Type 2 diabetes mellitus without complications: Secondary | ICD-10-CM | POA: Insufficient documentation

## 2011-09-02 DIAGNOSIS — H5789 Other specified disorders of eye and adnexa: Secondary | ICD-10-CM | POA: Insufficient documentation

## 2011-09-02 DIAGNOSIS — I10 Essential (primary) hypertension: Secondary | ICD-10-CM | POA: Insufficient documentation

## 2011-09-02 DIAGNOSIS — H109 Unspecified conjunctivitis: Secondary | ICD-10-CM | POA: Insufficient documentation

## 2011-09-02 DIAGNOSIS — H53149 Visual discomfort, unspecified: Secondary | ICD-10-CM | POA: Insufficient documentation

## 2011-09-02 LAB — DIFFERENTIAL
Basophils Absolute: 0 10*3/uL (ref 0.0–0.1)
Basophils Relative: 0 % (ref 0–1)
Lymphocytes Relative: 35 % (ref 12–46)
Monocytes Absolute: 0.7 10*3/uL (ref 0.1–1.0)
Neutro Abs: 7.4 10*3/uL (ref 1.7–7.7)
Neutrophils Relative %: 59 % (ref 43–77)

## 2011-09-02 LAB — CBC
HCT: 39 % (ref 36.0–46.0)
Hemoglobin: 13.9 g/dL (ref 12.0–15.0)
MCHC: 35.6 g/dL (ref 30.0–36.0)
RBC: 4.4 MIL/uL (ref 3.87–5.11)
WBC: 12.5 10*3/uL — ABNORMAL HIGH (ref 4.0–10.5)

## 2011-09-02 LAB — BASIC METABOLIC PANEL
BUN: 10 mg/dL (ref 6–23)
CO2: 24 mEq/L (ref 19–32)
Chloride: 103 mEq/L (ref 96–112)
Glucose, Bld: 248 mg/dL — ABNORMAL HIGH (ref 70–99)
Potassium: 3.1 mEq/L — ABNORMAL LOW (ref 3.5–5.1)
Sodium: 138 mEq/L (ref 135–145)

## 2011-09-02 MED ORDER — IOHEXOL 300 MG/ML  SOLN
100.0000 mL | Freq: Once | INTRAMUSCULAR | Status: AC | PRN
  Administered 2011-09-02: 100 mL via INTRAVENOUS

## 2013-06-30 ENCOUNTER — Other Ambulatory Visit: Payer: Self-pay

## 2013-10-24 ENCOUNTER — Encounter (HOSPITAL_COMMUNITY): Payer: Self-pay | Admitting: Emergency Medicine

## 2013-10-24 ENCOUNTER — Emergency Department (HOSPITAL_COMMUNITY): Payer: Medicare Other

## 2013-10-24 ENCOUNTER — Inpatient Hospital Stay (HOSPITAL_COMMUNITY)
Admission: EM | Admit: 2013-10-24 | Discharge: 2013-10-26 | DRG: 065 | Disposition: A | Discharge status: Rehabilitation Facility | Payer: Medicare Other | Attending: Internal Medicine | Admitting: Internal Medicine

## 2013-10-24 DIAGNOSIS — I509 Heart failure, unspecified: Secondary | ICD-10-CM | POA: Diagnosis present

## 2013-10-24 DIAGNOSIS — F411 Generalized anxiety disorder: Secondary | ICD-10-CM

## 2013-10-24 DIAGNOSIS — E785 Hyperlipidemia, unspecified: Secondary | ICD-10-CM

## 2013-10-24 DIAGNOSIS — M25552 Pain in left hip: Secondary | ICD-10-CM

## 2013-10-24 DIAGNOSIS — R4701 Aphasia: Secondary | ICD-10-CM | POA: Diagnosis present

## 2013-10-24 DIAGNOSIS — R29810 Facial weakness: Secondary | ICD-10-CM | POA: Diagnosis present

## 2013-10-24 DIAGNOSIS — E1151 Type 2 diabetes mellitus with diabetic peripheral angiopathy without gangrene: Secondary | ICD-10-CM | POA: Diagnosis present

## 2013-10-24 DIAGNOSIS — I639 Cerebral infarction, unspecified: Secondary | ICD-10-CM

## 2013-10-24 DIAGNOSIS — IMO0001 Reserved for inherently not codable concepts without codable children: Secondary | ICD-10-CM

## 2013-10-24 DIAGNOSIS — Z7982 Long term (current) use of aspirin: Secondary | ICD-10-CM

## 2013-10-24 DIAGNOSIS — D649 Anemia, unspecified: Secondary | ICD-10-CM

## 2013-10-24 DIAGNOSIS — G8190 Hemiplegia, unspecified affecting unspecified side: Secondary | ICD-10-CM | POA: Diagnosis present

## 2013-10-24 DIAGNOSIS — I10 Essential (primary) hypertension: Secondary | ICD-10-CM

## 2013-10-24 DIAGNOSIS — F17200 Nicotine dependence, unspecified, uncomplicated: Secondary | ICD-10-CM | POA: Diagnosis present

## 2013-10-24 DIAGNOSIS — Z880 Allergy status to penicillin: Secondary | ICD-10-CM

## 2013-10-24 DIAGNOSIS — Z79899 Other long term (current) drug therapy: Secondary | ICD-10-CM

## 2013-10-24 DIAGNOSIS — E1149 Type 2 diabetes mellitus with other diabetic neurological complication: Secondary | ICD-10-CM | POA: Diagnosis present

## 2013-10-24 DIAGNOSIS — I6350 Cerebral infarction due to unspecified occlusion or stenosis of unspecified cerebral artery: Principal | ICD-10-CM | POA: Diagnosis present

## 2013-10-24 DIAGNOSIS — M1610 Unilateral primary osteoarthritis, unspecified hip: Secondary | ICD-10-CM | POA: Diagnosis present

## 2013-10-24 DIAGNOSIS — E1142 Type 2 diabetes mellitus with diabetic polyneuropathy: Secondary | ICD-10-CM | POA: Diagnosis present

## 2013-10-24 DIAGNOSIS — I672 Cerebral atherosclerosis: Secondary | ICD-10-CM | POA: Diagnosis present

## 2013-10-24 DIAGNOSIS — E119 Type 2 diabetes mellitus without complications: Secondary | ICD-10-CM

## 2013-10-24 DIAGNOSIS — Z23 Encounter for immunization: Secondary | ICD-10-CM

## 2013-10-24 DIAGNOSIS — I5032 Chronic diastolic (congestive) heart failure: Secondary | ICD-10-CM

## 2013-10-24 DIAGNOSIS — M25559 Pain in unspecified hip: Secondary | ICD-10-CM

## 2013-10-24 HISTORY — DX: Narcolepsy without cataplexy: G47.419

## 2013-10-24 LAB — DIFFERENTIAL
Eosinophils Absolute: 0.3 10*3/uL (ref 0.0–0.7)
Eosinophils Relative: 3 % (ref 0–5)
Lymphocytes Relative: 47 % — ABNORMAL HIGH (ref 12–46)
Lymphs Abs: 4.1 10*3/uL — ABNORMAL HIGH (ref 0.7–4.0)
Monocytes Absolute: 0.6 10*3/uL (ref 0.1–1.0)
Monocytes Relative: 6 % (ref 3–12)
Neutrophils Relative %: 44 % (ref 43–77)

## 2013-10-24 LAB — COMPREHENSIVE METABOLIC PANEL
ALT: 8 U/L (ref 0–35)
Alkaline Phosphatase: 75 U/L (ref 39–117)
CO2: 22 mEq/L (ref 19–32)
Creatinine, Ser: 0.66 mg/dL (ref 0.50–1.10)
GFR calc Af Amer: >90 mL/min (ref >90)
GFR calc non Af Amer: >90 mL/min (ref >90)
Glucose, Bld: 130 mg/dL — ABNORMAL HIGH (ref 70–99)
Potassium: 3.5 mEq/L (ref 3.5–5.1)
Sodium: 138 mEq/L (ref 135–145)

## 2013-10-24 LAB — CBC
Hemoglobin: 12.7 g/dL (ref 12.0–15.0)
MCHC: 35 g/dL (ref 30.0–36.0)
MCV: 92.1 fL (ref 78.0–100.0)
Platelets: 271 10*3/uL (ref 150–400)
RBC: 3.94 MIL/uL (ref 3.87–5.11)
WBC: 8.8 10*3/uL (ref 4.0–10.5)

## 2013-10-24 LAB — POCT I-STAT TROPONIN I

## 2013-10-24 LAB — PROTIME-INR: Prothrombin Time: 12.9 seconds (ref 11.6–15.2)

## 2013-10-24 NOTE — ED Notes (Signed)
Patient with slurred speech and facial droop since last evening.  Family did not notice until today.  Patient states that the symptoms started last night.  Patient with facial droop on left.  Patient is CAOx3.  Weakness in left arm and left leg.

## 2013-10-25 ENCOUNTER — Inpatient Hospital Stay (HOSPITAL_COMMUNITY): Payer: Medicare Other

## 2013-10-25 ENCOUNTER — Encounter (HOSPITAL_COMMUNITY): Payer: Self-pay | Admitting: *Deleted

## 2013-10-25 DIAGNOSIS — I059 Rheumatic mitral valve disease, unspecified: Secondary | ICD-10-CM

## 2013-10-25 DIAGNOSIS — M25559 Pain in unspecified hip: Secondary | ICD-10-CM | POA: Diagnosis present

## 2013-10-25 DIAGNOSIS — I6350 Cerebral infarction due to unspecified occlusion or stenosis of unspecified cerebral artery: Principal | ICD-10-CM

## 2013-10-25 DIAGNOSIS — I639 Cerebral infarction, unspecified: Secondary | ICD-10-CM | POA: Diagnosis present

## 2013-10-25 DIAGNOSIS — E119 Type 2 diabetes mellitus without complications: Secondary | ICD-10-CM

## 2013-10-25 LAB — LIPID PANEL
LDL Cholesterol: 83 mg/dL (ref 0–99)
Total CHOL/HDL Ratio: 4.3 RATIO

## 2013-10-25 LAB — HEMOGLOBIN A1C: Hgb A1c MFr Bld: 7.5 % — ABNORMAL HIGH (ref <5.7)

## 2013-10-25 MED ORDER — ASPIRIN 325 MG PO TABS
325.0000 mg | ORAL_TABLET | Freq: Every day | ORAL | Status: DC
  Administered 2013-10-25 – 2013-10-26 (×2): 325 mg via ORAL
  Filled 2013-10-25 (×2): qty 1

## 2013-10-25 MED ORDER — ATORVASTATIN CALCIUM 10 MG PO TABS
10.0000 mg | ORAL_TABLET | Freq: Every day | ORAL | Status: DC
  Administered 2013-10-25: 10 mg via ORAL
  Filled 2013-10-25 (×2): qty 1

## 2013-10-25 MED ORDER — INFLUENZA VAC SPLIT QUAD 0.5 ML IM SUSP
0.5000 mL | INTRAMUSCULAR | Status: AC
  Administered 2013-10-26: 0.5 mL via INTRAMUSCULAR
  Filled 2013-10-25: qty 0.5

## 2013-10-25 MED ORDER — ASPIRIN 81 MG PO CHEW
324.0000 mg | CHEWABLE_TABLET | Freq: Once | ORAL | Status: AC
  Administered 2013-10-25: 324 mg via ORAL
  Filled 2013-10-25: qty 4

## 2013-10-25 MED ORDER — INSULIN ASPART 100 UNIT/ML ~~LOC~~ SOLN: 0.0000 [IU] | Freq: Three times a day (TID) | SUBCUTANEOUS | Status: DC

## 2013-10-25 MED ORDER — NAPROXEN 500 MG PO TABS
500.0000 mg | ORAL_TABLET | Freq: Two times a day (BID) | ORAL | Status: DC | PRN
  Filled 2013-10-25: qty 1

## 2013-10-25 MED ORDER — ENOXAPARIN SODIUM 40 MG/0.4ML ~~LOC~~ SOLN
40.0000 mg | SUBCUTANEOUS | Status: DC
  Administered 2013-10-25 – 2013-10-26 (×2): 40 mg via SUBCUTANEOUS
  Filled 2013-10-25 (×2): qty 0.4

## 2013-10-25 MED ORDER — LISINOPRIL 5 MG PO TABS
5.0000 mg | ORAL_TABLET | Freq: Every day | ORAL | Status: DC
  Administered 2013-10-25 – 2013-10-26 (×2): 5 mg via ORAL
  Filled 2013-10-25 (×3): qty 1

## 2013-10-25 MED ORDER — INSULIN ASPART 100 UNIT/ML ~~LOC~~ SOLN
0.0000 [IU] | Freq: Three times a day (TID) | SUBCUTANEOUS | Status: DC
  Administered 2013-10-25 (×2): 1 [IU] via SUBCUTANEOUS

## 2013-10-25 MED ORDER — INSULIN ASPART 100 UNIT/ML ~~LOC~~ SOLN: 0.0000 [IU] | Freq: Every day | SUBCUTANEOUS | Status: DC

## 2013-10-25 MED ORDER — TRAMADOL HCL 50 MG PO TABS
50.0000 mg | ORAL_TABLET | Freq: Four times a day (QID) | ORAL | Status: DC | PRN
  Administered 2013-10-25 – 2013-10-26 (×3): 50 mg via ORAL
  Filled 2013-10-25 (×3): qty 1

## 2013-10-25 NOTE — ED Provider Notes (Signed)
CSN: [id number]     Arrival date & time 10/24/13  2225 History   First MD Initiated Contact with Patient 10/24/13 2314     Chief Complaint  Patient presents with  . Cerebrovascular Accident   (Consider location/radiation/quality/duration/timing/severity/associated sxs/prior Treatment) HPI Please note that this is a late entry. This patient was seen and examined by me after her presentation to the emergency department. The patient is a 67 year old woman with uncontrolled diabetes and hypertension who presents with approximately 26-20 hours of left facial droop and left hemiparesis. The patient is able to ambulate. Weakness more severe in the left leg compared to the left arm. The patient has some mild dysarthria. This isn't worse by the patient's daughter and sister. The patient denies headache. She has no history of stroke. She denies any visual changes. No chest pain.  Past Medical History  Diagnosis Date  . Hypertension   . Diabetes mellitus   . Chest pain     Troponins up to 0.12 while hospitalized for E.coli PNA  . 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 Bacteremia   . Anemia    History reviewed. No pertinent past surgical history. History reviewed. No pertinent family history. History  Substance Use Topics  . Smoking status: Current Every Day Smoker  . Smokeless tobacco: Not on file  . Alcohol Use: No   OB History   Grav Para Term Preterm Abortions TAB SAB Ect Mult Living                 Review of Systems Gen: no weight loss, fevers, chills, night sweats Eyes: no discharge or drainage, no occular pain or visual changes Nose: no epistaxis or rhinorrhea Mouth: no dental pain, no sore throat Neck: no neck pain Lungs: no SOB, cough, wheezing CV: no chest pain, palpitations, dependent edema or orthopnea Abd: no abdominal pain, nausea, vomiting GU: no dysuria or gross hematuria MSK: no myalgias or arthralgias Neuro: As per history of present illness,  otherwise negative Skin: no rash Psyche: negative.  Allergies  Penicillins  Home Medications   Current Outpatient Rx  Name  Route  Sig  Dispense  Refill  . co-enzyme Q-10 30 MG capsule   Oral   Take 100 mg by mouth 2 (two) times daily.         Marland Kitchen ibuprofen (ADVIL,MOTRIN) 100 MG tablet   Oral   Take 400 mg by mouth every 6 (six) hours as needed for pain (pain in hip).          BP 165/76  Pulse 66  Temp(Src) 98.7 F (37.1 C)  Resp 17  SpO2 99% Physical Exam Gen: well developed and well nourished appearing Head: NCAT Eyes: PERL, EOMI Nose: no epistaixis or rhinorrhea Mouth/throat: mucosa is moist and pink Neck: supple, no stridor Lungs: CTA B, no wheezing, rhonchi or rales CV: RRR, no murmur, ext well perfused.  Abd: soft, notender, nondistended Back: no ttp, no cva ttp Skin: no rashese, wnl Neuro: left facial droop with sparring of the forehead, mild dysarthria, flacid left arm with 1/5 strength, left leg with 4/5 strength, 5/5 strength right arm and leg.  Psyche; normal affect,  calm and cooperative.   ED Course  Procedures (including critical care time) Labs Review  Results for orders placed during the hospital encounter of 10/24/13 (from the past 24 hour(s))  GLUCOSE, CAPILLARY     Status: Abnormal   Collection Time    10/24/13 10:39  PM      Result Value Range   Glucose-Capillary 117 (*) 70 - 99 mg/dL  PROTIME-INR     Status: None   Collection Time    10/24/13 11:00 PM      Result Value Range   Prothrombin Time 12.9  11.6 - 15.2 seconds   INR 0.99  0.00 - 1.49  APTT     Status: Abnormal   Collection Time    10/24/13 11:00 PM      Result Value Range   aPTT 39 (*) 24 - 37 seconds  CBC     Status: None   Collection Time    10/24/13 11:00 PM      Result Value Range   WBC 8.8  4.0 - 10.5 K/uL   RBC 3.94  3.87 - 5.11 MIL/uL   Hemoglobin 12.7  12.0 - 15.0 g/dL   HCT 62.1  30.8 - 65.7 %   MCV 92.1  78.0 - 100.0 fL   MCH 32.2  26.0 - 34.0 pg   MCHC  35.0  30.0 - 36.0 g/dL   RDW 84.6  96.2 - 95.2 %   Platelets 271  150 - 400 K/uL  DIFFERENTIAL     Status: Abnormal   Collection Time    10/24/13 11:00 PM      Result Value Range   Neutrophils Relative % 44  43 - 77 %   Neutro Abs 3.8  1.7 - 7.7 K/uL   Lymphocytes Relative 47 (*) 12 - 46 %   Lymphs Abs 4.1 (*) 0.7 - 4.0 K/uL   Monocytes Relative 6  3 - 12 %   Monocytes Absolute 0.6  0.1 - 1.0 K/uL   Eosinophils Relative 3  0 - 5 %   Eosinophils Absolute 0.3  0.0 - 0.7 K/uL   Basophils Relative 1  0 - 1 %   Basophils Absolute 0.0  0.0 - 0.1 K/uL  COMPREHENSIVE METABOLIC PANEL     Status: Abnormal   Collection Time    10/24/13 11:00 PM      Result Value Range   Sodium 138  135 - 145 mEq/L   Potassium 3.5  3.5 - 5.1 mEq/L   Chloride 105  96 - 112 mEq/L   CO2 22  19 - 32 mEq/L   Glucose, Bld 130 (*) 70 - 99 mg/dL   BUN 13  6 - 23 mg/dL   Creatinine, Ser 8.41  0.50 - 1.10 mg/dL   Calcium 9.1  8.4 - 32.4 mg/dL   Total Protein 7.0  6.0 - 8.3 g/dL   Albumin 3.7  3.5 - 5.2 g/dL   AST 18  0 - 37 U/L   ALT 8  0 - 35 U/L   Alkaline Phosphatase 75  39 - 117 U/L   Total Bilirubin 0.2 (*) 0.3 - 1.2 mg/dL   GFR calc non Af Amer >90  >90 mL/min   GFR calc Af Amer >90  >90 mL/min  TROPONIN I     Status: None   Collection Time    10/24/13 11:00 PM      Result Value Range   Troponin I <0.30  <0.30 ng/mL  POCT I-STAT TROPONIN I     Status: None   Collection Time    10/24/13 11:22 PM      Result Value Range   Troponin i, poc 0.01  0.00 - 0.08 ng/mL   Comment 3  Imaging Review Ct Head (brain) Wo Contrast  10/24/2013   CLINICAL DATA:  Slurred speech. Facial drooping.  EXAM: CT HEAD WITHOUT CONTRAST  TECHNIQUE: Contiguous axial images were obtained from the base of the skull through the vertex without intravenous contrast.  COMPARISON:  Head CT 09/02/2011.  FINDINGS: Patchy and confluent areas of decreased attenuation are noted throughout the deep and periventricular white  matter of the cerebral hemispheres bilaterally, compatible with chronic microvascular ischemic disease. No acute intracranial abnormalities. Specifically, no evidence of acute intracranial hemorrhage, no definite findings of acute/subacute cerebral ischemia, no mass, mass effect, hydrocephalus or abnormal intra or extra-axial fluid collections. Visualized paranasal sinuses and mastoids are well pneumatized, with exception a small polypoid lesion in the posterior aspect of the right maxillary sinus. No acute displaced skull fractures are identified.  IMPRESSION: 1. No acute intracranial abnormalities. 2. Mild chronic microvascular ischemic changes in the cerebral white matter, as above. 3. Small polypoid lesion in the posterior aspect of the right maxillary sinus may represent a mucosal retention cyst or nasal polyp.   Electronically Signed   By: Trudie Reed M.D.   On: 10/24/2013 23:21    EKG: nsr, no acute ischemic changes, normal intervals, normal axis, normal qrs complex  MDM   1. CVA (cerebral infarction)   2. 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 The patient is not a candidate for tPA as she is well out of the window of eligibility. She has not had any progression of sx in the ED. No progression of sx from a subjective standpoint. We have treated with ASA. BP is wnl. Patient in NSR. Case discussed with Dr. Allena Katz who has accepted the patient for admission.     Brandt Loosen, MD 10/25/13 [phone]

## 2013-10-25 NOTE — Progress Notes (Signed)
UR Completed.  Patient changed to IP status r/t neurologic symptoms, slurred speech and hemiparesis.  Continuing with stroke workup

## 2013-10-25 NOTE — Progress Notes (Signed)
[place] [facility]  CONSULT SERVICE NOTE   Pt in procedure. Will see for formal consult in the am.   Ranelle Oyster, MD, [facility] Physical Medicine & Rehabilitation

## 2013-10-25 NOTE — Consult Note (Signed)
Referring Physician: Dr. Allena Katz    Chief Complaint: Slurred speech and left-sided weakness  HPI: ALLICIA CULLEY is an 67 y.o. female Mr. diabetes mellitus and hypertension who developed acute onset of slurred speech and left-sided weakness at 10:30 PM on 10/23/2013. There is no previous history of stroke nor TIA. Patient has not been on antiplatelet therapy. CT scan of her head showed no signs of acute intracranial abnormality. NIH stroke score was 4.  LSN: 10:30 PM on 10/23/2013 tPA Given: No: Beyond time window for treatment consideration MRankin: 1  Past Medical History  Diagnosis Date  . Hypertension   . Diabetes mellitus   . Chest pain     Troponins up to 0.12 while hospitalized for E.coli PNA  . 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 Bacteremia   . Anemia     History reviewed. No pertinent family history.   Medications: I have reviewed the patient's current medications.  ROS: History obtained from the patient  General ROS: negative for - chills, fatigue, fever, night sweats, weight gain or weight loss Psychological ROS: negative for - behavioral disorder, hallucinations, memory difficulties, mood swings or suicidal ideation Ophthalmic ROS: negative for - blurry vision, double vision, eye pain or loss of vision ENT ROS: negative for - epistaxis, nasal discharge, oral lesions, sore throat, tinnitus or vertigo Allergy and Immunology ROS: negative for - hives or itchy/watery eyes Hematological and Lymphatic ROS: negative for - bleeding problems, bruising or swollen lymph nodes Endocrine ROS: negative for - galactorrhea, hair pattern changes, polydipsia/polyuria or temperature intolerance Respiratory ROS: negative for - cough, hemoptysis, shortness of breath or wheezing Cardiovascular ROS: negative for - chest pain, dyspnea on exertion, edema or irregular heartbeat Gastrointestinal ROS: negative for - abdominal pain, diarrhea, hematemesis, nausea/vomiting or  stool incontinence Genito-Urinary ROS: negative for - dysuria, hematuria, incontinence or urinary frequency/urgency Musculoskeletal ROS: negative for - joint swelling or muscular weakness Neurological ROS: as noted in HPI Dermatological ROS: negative for rash and skin lesion changes  Physical Examination: Blood pressure 165/76, pulse 66, temperature 98.7 F (37.1 C), resp. rate 17, SpO2 99.00%.  Neurologic Examination: Mental Status: Alert, oriented, thought content appropriate.  Speech fluent without evidence of aphasia. Able to follow commands without difficulty. Cranial Nerves: II-Visual fields were normal. III/IV/VI-left pupil reacted normally to light and was of normal size; right corneal is opacified. Extraocular movements were full and conjugate.    V/VII-no facial numbness; mode left lower facial weakness. VIII-normal. X-mild dysarthria. Motor: Moderate weakness proximally in moderate weakness distally of left upper and lower extremities; normal strength on the right; normal muscle tone throughout. Sensory: Reduced vibratory sensation distally in both lower extremities. Deep Tendon Reflexes: 2+ and symmetric. Plantars: Flexor bilaterally Cerebellar: Normal finger-to-nose testing. Carotid auscultation: Normal  Ct Head (brain) Wo Contrast  10/24/2013   CLINICAL DATA:  Slurred speech. Facial drooping.  EXAM: CT HEAD WITHOUT CONTRAST  TECHNIQUE: Contiguous axial images were obtained from the base of the skull through the vertex without intravenous contrast.  COMPARISON:  Head CT 09/02/2011.  FINDINGS: Patchy and confluent areas of decreased attenuation are noted throughout the deep and periventricular white matter of the cerebral hemispheres bilaterally, compatible with chronic microvascular ischemic disease. No acute intracranial abnormalities. Specifically, no evidence of acute intracranial hemorrhage, no definite findings of acute/subacute cerebral ischemia, no mass, mass effect,  hydrocephalus or abnormal intra or extra-axial fluid collections. Visualized paranasal sinuses and mastoids are well pneumatized, with exception a  small polypoid lesion in the posterior aspect of the right maxillary sinus. No acute displaced skull fractures are identified.  IMPRESSION: 1. No acute intracranial abnormalities. 2. Mild chronic microvascular ischemic changes in the cerebral white matter, as above. 3. Small polypoid lesion in the posterior aspect of the right maxillary sinus may represent a mucosal retention cyst or nasal polyp.   Electronically Signed   By: Trudie Reed M.D.   On: 10/24/2013 23:21    Assessment: 67 y.o. female with hypertension and diabetes presenting with probable acute right subcortical MCA territory ischemic stroke.  Stroke Risk Factors - diabetes mellitus, family history and hypertension  Plan: 1. HgbA1c, fasting lipid panel 2. MRI, MRA  of the brain without contrast 3. PT consult, OT consult, Speech consult 4. Echocardiogram 5. Carotid dopplers 6. Prophylactic therapy-Antiplatelet med: Aspirin  7. Risk factor modification 8. Telemetry monitoring   C.R. Roseanne Reno, MD [facility] [phone]  10/25/2013, 6:58 AM

## 2013-10-25 NOTE — Consult Note (Signed)
Physical Medicine and Rehabilitation Consult Reason for Consult: CVA Referring Physician: [facility]   HPI: Laurie [name] is a 67 y.o. right-handed female with history of hypertension as well as diabetes mellitus with peripheral neuropathy and tobacco abuse. Admitted 10/24/2013 with left-sided weakness and slurred speech. Cranial CT scan negative. MRI of the brain showed acute nonhemorrhagic infarct anterior right pons as well as remote lacunar infarct posterior right lentiform nucleus. 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 Echocardiogram with ejection fraction of 60% grade 2 diastolic dysfunction. Carotid Dopplers are pending. Neurology followup with workup ongoing. Patient did not receive TPA. Placed on aspirin therapy for CVA prophylaxis as well as subcutaneous Lovenox for DVT prophylaxis. Physical and occupational therapy evaluations completed 10/25/2012 with recommendations of physical medicine rehabilitation consult to consider inpatient rehabilitation services.   Review of Systems  Cardiovascular: Positive for chest pain.  Musculoskeletal: Positive for back pain and myalgias.  All other systems reviewed and are negative.   Past Medical History  Diagnosis Date  . Hypertension   . Diabetes mellitus   . Chest pain     Troponins up to 0.12 while hospitalized for E.coli PNA  . 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 Bacteremia   . Anemia    History reviewed. No pertinent past surgical history. History reviewed. No pertinent family history. Social History:  reports that she has been smoking.  She does not have any smokeless tobacco history on file. She reports that she does not drink alcohol or use illicit drugs. Allergies:  Allergies  Allergen Reactions  . Penicillins    Medications Prior to Admission  Medication Sig Dispense Refill  . co-enzyme Q-10 30 MG capsule Take 100 mg by mouth 2 (two)  times daily.      Marland Kitchen ibuprofen (ADVIL,MOTRIN) 100 MG tablet Take 400 mg by mouth every 6 (six) hours as needed for pain (pain in hip).        Home: Home Living Family/patient expects to be discharged to:: Private residence Living Arrangements: Children Available Help at Discharge: Family;Available PRN/intermittently Type of Home: Apartment Home Access: Stairs to enter Entrance Stairs-Number of Steps: 2 Home Layout: One level  Functional History:   Functional Status:  Mobility: Bed Mobility Bed Mobility: Supine to Sit Supine to Sit: 5: Supervision Transfers Transfers: Sit to Stand;Stand to Sit Sit to Stand: 4: Min assist;With upper extremity assist;From bed;From toilet;From chair/3-in-1 Stand to Sit: 4: Min assist;With upper extremity assist;To chair/3-in-1;To toilet Ambulation/Gait Ambulation/Gait Assistance: 4: Min assist Ambulation Distance (Feet): 50 Feet Assistive device: 1 person hand held assist Ambulation/Gait Assistance Details: Pt with slight L knee buckling but able to correct, requires min A L HHA with assistance needed for balance.  Decreased cadence, narrow BOS    ADL: ADL Eating/Feeding: Minimal assistance Where Assessed - Eating/Feeding: Chair Grooming: Minimal assistance Where Assessed - Grooming: Supported standing Upper Body Bathing: Moderate assistance Where Assessed - Upper Body Bathing: Supported sitting Lower Body Bathing: Minimal assistance Where Assessed - Lower Body Bathing: Supported sit to stand Upper Body Dressing: Moderate assistance Where Assessed - Upper Body Dressing: Supported sitting Lower Body Dressing: Moderate assistance Where Assessed - Lower Body Dressing: Supported sit to Pharmacist, hospital: Minimal assistance Toilet Transfer Method: Sit to stand (stand to sit) Acupuncturist: Comfort height toilet;Grab bars Tub/Shower Transfer Method: Not assessed Equipment Used: Gait belt Transfers/Ambulation Related to ADLs: [employer]  A ADL Comments: Educated to be moving fingers throughout day and using right hand to assist as needed. Also, educated and demonstrated retrograde massage on left hand as it had edema. Educated for pt to use left hand during activities and told her to "quiet her shoulder." Assisted pt in grooming at sink as she was trying to use left hand.   Cognition: Cognition Overall Cognitive Status: Impaired/Different from baseline Orientation Level: Oriented X4 Cognition Arousal/Alertness: Awake/alert Behavior During Therapy: WFL for tasks assessed/performed Overall Cognitive Status: Impaired/Different from baseline Area of Impairment: Attention Current Attention Level: Sustained General Comments: pt very talkative requiring cues to remain on task  Blood pressure 174/94, pulse 71, temperature 97.8 F (36.6 C), temperature source Oral, resp. rate 20, height 5\' 8"  (1.727 m), weight 80.1 kg (176 lb 9.4 oz), SpO2 100.00%. Physical Exam  Vitals reviewed. Constitutional: She is oriented to person, place, and time. She appears well-developed.  HENT:  Head: Normocephalic and atraumatic.  Right Ear: External ear normal.  Left Ear: External ear normal.  Left facial droop  Eyes: Conjunctivae and EOM are normal. Pupils are equal, round, and reactive to light.  Pupils round and reactive to light  Neck: Normal range of motion. Neck supple. No JVD present. No tracheal deviation present. No thyromegaly present.  Cardiovascular: Normal rate, regular rhythm and normal heart sounds.   Respiratory: Effort normal and breath sounds normal. No respiratory distress. She has no wheezes. She has no rales.  GI: Soft. Bowel sounds are normal. She exhibits no distension.  Musculoskeletal: She exhibits edema (mild edema left hand/wrist).  Lymphadenopathy:    She has no cervical adenopathy.  Neurological: She is alert and oriented to person, place, and time. She displays abnormal reflex. A cranial nerve deficit is present.  She exhibits normal muscle tone. Coordination abnormal.  Speech is dysarthric but intelligible. She makes good eye contact with examiner and follows commands. Left central 7 and tongue deviation. Fair to good sitting balance EOB. LUE 1/5 deltoid, bicep, tricep. Trace wrist and HI. LLE is 2+ HF and KE,KF. 2/5 ADF and APF. Senses pain and light touch. Cognitively displays good insight and awareness.  Skin: Skin is warm and dry.  Psychiatric: She has a normal mood and affect. Her behavior is normal. Thought content normal.    Results for orders placed during the hospital encounter of 10/24/13 (from the past 24 hour(s))  GLUCOSE, CAPILLARY     Status: Abnormal   Collection Time    10/24/13 10:39 PM      Result Value Range   Glucose-Capillary 117 (*) 70 - 99 mg/dL  PROTIME-INR     Status: None   Collection Time    10/24/13 11:00 PM      Result Value Range   Prothrombin Time 12.9  11.6 - 15.2 seconds   INR 0.99  0.00 - 1.49  APTT     Status: Abnormal   Collection Time    10/24/13 11:00 PM      Result Value Range   aPTT 39 (*) 24 - 37 seconds  CBC     Status: None   Collection Time    10/24/13 11:00 PM      Result Value Range   WBC 8.8  4.0 - 10.5 K/uL   RBC 3.94  3.87 - 5.11 MIL/uL   Hemoglobin 12.7  12.0 - 15.0 g/dL   HCT 45.4  09.8 - 11.9 %   MCV 92.1  78.0 - 100.0 fL   MCH 32.2  26.0 - 34.0 pg   MCHC 35.0  30.0 - 36.0 g/dL   RDW 16.1  09.6 - 04.5 %   Platelets 271  150 - 400 K/uL  DIFFERENTIAL     Status: Abnormal   Collection Time    10/24/13 11:00 PM      Result Value Range   Neutrophils Relative % 44  43 - 77 %   Neutro Abs 3.8  1.7 - 7.7 K/uL   Lymphocytes Relative 47 (*) 12 - 46 %   Lymphs Abs 4.1 (*) 0.7 - 4.0 K/uL   Monocytes Relative 6  3 - 12 %   Monocytes Absolute 0.6  0.1 - 1.0 K/uL   Eosinophils Relative 3  0 - 5 %   Eosinophils Absolute 0.3  0.0 - 0.7 K/uL   Basophils Relative 1  0 - 1 %   Basophils Absolute 0.0  0.0 - 0.1 K/uL  COMPREHENSIVE METABOLIC  PANEL     Status: Abnormal   Collection Time    10/24/13 11:00 PM      Result Value Range   Sodium 138  135 - 145 mEq/L   Potassium 3.5  3.5 - 5.1 mEq/L   Chloride 105  96 - 112 mEq/L   CO2 22  19 - 32 mEq/L   Glucose, Bld 130 (*) 70 - 99 mg/dL   BUN 13  6 - 23 mg/dL   Creatinine, Ser 4.09  0.50 - 1.10 mg/dL   Calcium 9.1  8.4 - 81.1 mg/dL   Total Protein 7.0  6.0 - 8.3 g/dL   Albumin 3.7  3.5 - 5.2 g/dL   AST 18  0 - 37 U/L   ALT 8  0 - 35 U/L   Alkaline Phosphatase 75  39 - 117 U/L   Total Bilirubin 0.2 (*) 0.3 - 1.2 mg/dL   GFR calc non Af Amer >90  >90 mL/min   GFR calc Af Amer >90  >90 mL/min  TROPONIN I     Status: None   Collection Time    10/24/13 11:00 PM      Result Value Range   Troponin I <0.30  <0.30 ng/mL  POCT I-STAT TROPONIN I     Status: None   Collection Time    10/24/13 11:22 PM      Result Value Range   Troponin i, poc 0.01  0.00 - 0.08 ng/mL   Comment 3           LIPID PANEL     Status: Abnormal   Collection Time    10/25/13  6:35 AM      Result Value Range   Cholesterol 150  0 - 200 mg/dL   Triglycerides 914 (*) <150 mg/dL   HDL 35 (*) >78 mg/dL   Total CHOL/HDL Ratio 4.3     VLDL 32  0 - 40 mg/dL   LDL Cholesterol 83  0 - 99 mg/dL  GLUCOSE, CAPILLARY     Status: Abnormal   Collection Time    10/25/13  7:08 AM      Result Value Range   Glucose-Capillary 117 (*) 70 - 99 mg/dL   Ct Head (brain) Wo Contrast  10/24/2013   CLINICAL DATA:  Slurred speech. Facial drooping.  EXAM: CT HEAD WITHOUT CONTRAST  TECHNIQUE: Contiguous axial images were obtained from the base of the skull through the vertex without intravenous contrast.  COMPARISON:  Head CT 09/02/2011.  FINDINGS: Patchy and confluent areas of decreased attenuation  are noted throughout the deep and periventricular white matter of the cerebral hemispheres bilaterally, compatible with chronic microvascular ischemic disease. No acute intracranial abnormalities. Specifically, no evidence of acute  intracranial hemorrhage, no definite findings of acute/subacute cerebral ischemia, no mass, mass effect, hydrocephalus or abnormal intra or extra-axial fluid collections. Visualized paranasal sinuses and mastoids are well pneumatized, with exception a small polypoid lesion in the posterior aspect of the right maxillary sinus. No acute displaced skull fractures are identified.  IMPRESSION: 1. No acute intracranial abnormalities. 2. Mild chronic microvascular ischemic changes in the cerebral white matter, as above. 3. Small polypoid lesion in the posterior aspect of the right maxillary sinus may represent a mucosal retention cyst or nasal polyp.   Electronically Signed   By: Trudie Reed M.D.   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 Does the need for close, 24 hr/day medical supervision in concert with the patient's rehab needs make it unreasonable for this patient to be served in a less intensive setting? Yes 2. Co-Morbidities requiring supervision/potential complications: anxiety, oa of left hip 3. Due to bladder management, bowel management, safety, skin/wound care, disease management, medication administration, pain management and patient education, does the patient require 24 hr/day rehab nursing? Yes 4. Does the patient require coordinated care of a physician, rehab nurse, PT (1-2 hrs/day, 5 days/week), OT (1-2 hrs/day, 5 days/week) and SLP (1-2 hrs/day, 5 days/week) to address physical and functional deficits in the context of the above medical diagnosis(es)? Yes Addressing deficits in the following areas: balance, endurance, locomotion, strength, transferring, bowel/bladder control, bathing, dressing, feeding, grooming, toileting, cognition, speech, language, swallowing and psychosocial support 5. Can the patient actively participate in an intensive therapy program of at least 3 hrs of therapy per day at least 5 days per week?  Yes 6. The potential for patient to make measurable gains while on inpatient rehab is excellent 7. Anticipated functional outcomes upon discharge from inpatient rehab are mod I to supervision with PT, mod I to min assist  with OT, mod I to supervision with SLP. 8. Estimated rehab length of stay to reach the above functional goals is: 16-22 days 9. Does the patient have adequate social supports to accommodate these discharge functional goals? Yes 10. Anticipated D/C setting: Home 11. Anticipated post D/C treatments: HH therapy 12. Overall Rehab/Functional Prognosis: excellent  RECOMMENDATIONS: This patient's condition is appropriate for continued rehabilitative care in the following setting: CIR Patient has agreed to participate in recommended program. Yes Note that insurance prior authorization may be required for reimbursement for recommended care.  Comment: Rehab RN to follow up. Potentially, admit today   Ranelle Oyster, MD, [facility]     10/25/2013

## 2013-10-25 NOTE — Progress Notes (Signed)
Nutrition Brief Note  Patient identified on the Malnutrition Screening Tool (MST) Report for weight lost without trying and eating poorly because of a decreased appetite.  Per weight readings below, patient's weight has been stable.  Wt Readings from Last 15 Encounters:  10/25/13 176 lb 9.4 oz (80.1 kg)  08/30/09 182 lb 14.4 oz (82.963 kg)  05/24/09 177 lb 12.8 oz (80.65 kg)  05/17/09 179 lb 9.6 oz (81.466 kg)    Body mass index is 26.86 kg/(m^2). Patient meets criteria for Overweight based on current BMI.   Current diet order is Carbohydrate Modified Medium Calorie, patient's average meal consumption is 75% at this time. Labs and medications reviewed.   No nutrition interventions warranted at this time. If nutrition issues arise, please consult RD.   Maureen Chatters, RD, LDN Pager #: [phone] After-Hours Pager #: [phone]

## 2013-10-25 NOTE — Progress Notes (Signed)
  Echocardiogram 2D Echocardiogram has been performed.  [place]ian Co 10/25/2013, 3:25 PM

## 2013-10-25 NOTE — ED Notes (Signed)
Pts family took home all of pts belongings.

## 2013-10-25 NOTE — Progress Notes (Signed)
Rehab Admissions Coordinator Note:  Patient was screened by Trish Mage for appropriateness for an Inpatient Acute Rehab Consult.  Noted PT/OT recommending CIR.  At this time, we are recommending Inpatient Rehab consult.  Trish Mage 10/25/2013, 10:31 AM  I can be reached at [phone].

## 2013-10-25 NOTE — Progress Notes (Addendum)
[facility] PROGRESS NOTE  Laurie [name]:[id number] DOB: [birth year] DOA: 10/24/2013 PCP: No primary provider on file.  Assessment/Plan  Patient presents with acute stroke.   - Telemetry:  NSR with PVC -  MRI brain/MRA head pending -  Carotid duplex pendign -  ECHO pending -  Aspirin 325mg  daily -  Lipid panel -  Hemoglobin A1c -  PT/OT/SLP  -  Appreciate Neurology assistance  Diabetes mellitus, A1c 7.5 -   FS well controlled since last night - Decrease to low dose SSI  Hip arthralgia due to arthritios -  Encouraged outpatient ortho evaluation -  Continue prn tramadol -  Start naproxyn prn  HTN, uncontrolled -  Start lisinopril -  Add HCTZ prn  Diet:  Diabetic Access:  PIV IVF:  OFF Proph:  lovenox  Code Status: full Family Communication: patietn alone Disposition Plan: pending further w/u for stroke, likely to home in a 1-2 days   Consultants:  neurology  Procedures:  CT head  Antibiotics:  none   HPI/Subjective:  Improved strength in left side and improved speech.  No new numbness or weakness or confusion.    Objective: Filed Vitals:   10/25/13 0115 10/25/13 0330 10/25/13 0530 10/25/13 0759  BP: 165/76 183/90 146/65 174/77  Pulse: 66 79 66 64  Temp:  98.3 F (36.8 C) 98.4 F (36.9 C) 97.9 F (36.6 C)  TempSrc:  Oral Oral Oral  Resp: 17 20 18 18   Height:  5\' 8"  (1.727 m)    Weight:  80.1 kg (176 lb 9.4 oz)    SpO2: 99% 98% 100% 98%   No intake or output data in the 24 hours ending 10/25/13 0826 Filed Weights   10/25/13 0330  Weight: 80.1 kg (176 lb 9.4 oz)    Exam:   General:  AAF, no acute distress  HEENT:  NCAT, MMM  Cardiovascular:  RRR, nl S1, S2 no mrg, 2+ pulses, warm extremities  Respiratory:  CTAB, no increased WOB  Abdomen:   NABS, soft, NT/ND  MSK:   Normal tone and bulk, no LEE  Neuro:  Mild left facial droop, 4/5 left upper and  Lower extremity strength compared to 5/5 right side.  Sensation decreased  equally  Bilateral feet and ankles.  Mild dysmetria due to weakness left side  Data Reviewed: Basic Metabolic Panel:  Recent Labs Lab 10/24/13 2300  NA 138  K 3.5  CL 105  CO2 22  GLUCOSE 130*  BUN 13  CREATININE 0.66  CALCIUM 9.1   Liver Function Tests:  Recent Labs Lab 10/24/13 2300  AST 18  ALT 8  ALKPHOS 75  BILITOT 0.2*  PROT 7.0  ALBUMIN 3.7   No results found for this basename: LIPASE, AMYLASE,  in the last 168 hours No results found for this basename: AMMONIA,  in the last 168 hours CBC:  Recent Labs Lab 10/24/13 2300  WBC 8.8  NEUTROABS 3.8  HGB 12.7  HCT 36.3  MCV 92.1  PLT 271   Cardiac Enzymes:  Recent Labs Lab 10/24/13 2300  TROPONINI <0.30   BNP (last 3 results) No results found for this basename: PROBNP,  in the last 8760 hours CBG:  Recent Labs Lab 10/24/13 2239 10/25/13 0708  GLUCAP 117* 117*    No results found for this or any previous visit (from the past 240 hour(s)).   Studies: Ct Head (brain) Wo Contrast  10/24/2013   CLINICAL DATA:  Slurred speech. Facial drooping.  EXAM: CT HEAD  WITHOUT CONTRAST  TECHNIQUE: Contiguous axial images were obtained from the base of the skull through the vertex without intravenous contrast.  COMPARISON:  Head CT 09/02/2011.  FINDINGS: Patchy and confluent areas of decreased attenuation are noted throughout the deep and periventricular white matter of the cerebral hemispheres bilaterally, compatible with chronic microvascular ischemic disease. No acute intracranial abnormalities. Specifically, no evidence of acute intracranial hemorrhage, no definite findings of acute/subacute cerebral ischemia, no mass, mass effect, hydrocephalus or abnormal intra or extra-axial fluid collections. Visualized paranasal sinuses and mastoids are well pneumatized, with exception a small polypoid lesion in the posterior aspect of the right maxillary sinus. No acute displaced skull fractures are identified.  IMPRESSION: 1.  No acute intracranial abnormalities. 2. Mild chronic microvascular ischemic changes in the cerebral white matter, as above. 3. Small polypoid lesion in the posterior aspect of the right maxillary sinus may represent a mucosal retention cyst or nasal polyp.   Electronically Signed   By: Trudie Reed M.D.   On: 10/24/2013 23:21    Scheduled Meds: . aspirin  325 mg Oral Daily  . enoxaparin (LOVENOX) injection  40 mg Subcutaneous Q24H  . [START ON 10/26/2013] influenza vac split quadrivalent PF  0.5 mL Intramuscular Tomorrow-1000  . insulin aspart  0-15 Units Subcutaneous TID WC  . insulin aspart  0-5 Units Subcutaneous QHS   Continuous Infusions:   Principal Problem:   CVA (cerebral infarction) Active Problems:   DIABETES MELLITUS, UNCONTROLLED   Arthralgia of hip    Time spent: 30 min    Kristapher [place], [facility]  [facility] Pager [phone]. If 7PM-7AM, please contact night-coverage at [email or web address], password [facility] 10/25/2013, 8:26 AM  LOS: 1 day

## 2013-10-25 NOTE — ED Notes (Signed)
Admitting MD at bedside.

## 2013-10-25 NOTE — Progress Notes (Signed)
Stroke Team Progress Note  HISTORY Laurie [name] is an 67 y.o. female Mr. diabetes mellitus and hypertension who developed acute onset of slurred speech and left-sided weakness at 10:30 PM on 10/23/2013. There is no previous history of stroke nor TIA. Patient has not been on antiplatelet therapy. CT scan of her head showed no signs of acute intracranial abnormality. NIH stroke score was 4.   LSN: 10:30 PM on 10/23/2013  tPA Given: No: Beyond time window for treatment consideration  MRankin: 1   She was admitted for further evaluation and treatment.   SUBJECTIVE Patient is lying in bed. Overall she feels her condition is gradually improving. No new neurological symptoms.     OBJECTIVE Most recent Vital Signs: Filed Vitals:   10/25/13 0115 10/25/13 0330 10/25/13 0530 10/25/13 0759  BP: 165/76 183/90 146/65 174/77  Pulse: 66 79 66 64  Temp:  98.3 F (36.8 C) 98.4 F (36.9 C) 97.9 F (36.6 C)  TempSrc:  Oral Oral Oral  Resp: 17 20 18 18   Height:  5\' 8"  (1.727 m)    Weight:  80.1 kg (176 lb 9.4 oz)    SpO2: 99% 98% 100% 98%   CBG (last 3)   Recent Labs  10/24/13 2239 10/25/13 0708  GLUCAP 117* 117*    IV Fluid Intake:     MEDICATIONS  . aspirin  325 mg Oral Daily  . enoxaparin (LOVENOX) injection  40 mg Subcutaneous Q24H  . [START ON 10/26/2013] influenza vac split quadrivalent PF  0.5 mL Intramuscular Tomorrow-1000  . insulin aspart  0-15 Units Subcutaneous TID WC  . insulin aspart  0-5 Units Subcutaneous QHS   PRN:  [email or web address]  Diet:  Carb Control thin liquids Activity:  Bathroom privileges with assistance DVT Prophylaxis:  lovenox  CLINICALLY SIGNIFICANT STUDIES Basic Metabolic Panel:  Recent Labs Lab 10/24/13 2300  NA 138  K 3.5  CL 105  CO2 22  GLUCOSE 130*  BUN 13  CREATININE 0.66  CALCIUM 9.1   Liver Function Tests:  Recent Labs Lab 10/24/13 2300  AST 18  ALT 8  ALKPHOS 75  BILITOT 0.2*  PROT 7.0  ALBUMIN 3.7   CBC:  Recent Labs Lab  10/24/13 2300  WBC 8.8  NEUTROABS 3.8  HGB 12.7  HCT 36.3  MCV 92.1  PLT 271   Coagulation:  Recent Labs Lab 10/24/13 2300  LABPROT 12.9  INR 0.99   Cardiac Enzymes:  Recent Labs Lab 10/24/13 2300  TROPONINI <0.30   Urinalysis: No results found for this basename: COLORURINE, APPERANCEUR, LABSPEC, PHURINE, GLUCOSEU, HGBUR, BILIRUBINUR, KETONESUR, PROTEINUR, UROBILINOGEN, NITRITE, LEUKOCYTESUR,  in the last 168 hours Lipid Panel    Component Value Date/Time   CHOL 150 10/25/2013 0635   TRIG 158* 10/25/2013 0635   HDL 35* 10/25/2013 0635   CHOLHDL 4.3 10/25/2013 0635   VLDL 32 10/25/2013 0635   LDLCALC 83 10/25/2013 0635   HgbA1C  Lab Results  Component Value Date   HGBA1C 7.1 08/30/2009    Urine Drug Screen:   No results found for this basename: labopia, cocainscrnur, labbenz, amphetmu, thcu, labbarb    Alcohol Level: No results found for this basename: ETH,  in the last 168 hours  Ct Head (brain) Wo Contrast 10/24/2013  1. No acute intracranial abnormalities. 2. Mild chronic microvascular ischemic changes in the cerebral white matter, as above. 3. Small polypoid lesion in the posterior aspect of the right maxillary sinus may represent a mucosal retention cyst or nasal polyp.  MRI of the brain    MRA of the brain    2D Echocardiogram    Carotid Doppler    CXR    EKG  normal sinus rhythm.   Therapy Recommendations   Physical Exam   Mental Status:  Alert, oriented, thought content appropriate. Speech fluent without evidence of aphasia. Able to follow commands without difficulty.  Cranial Nerves:  II-Visual fields were normal.  III/IV/VI-left pupil reacted normally to light and was of normal size; right corneal is opacified. Extraocular movements were full and conjugate.  V/VII-no facial numbness; mode left lower facial weakness.  VIII-normal.  X-mild dysarthria.  Motor: Moderate weakness proximally in moderate weakness distally of left upper and lower  extremities; normal strength on the right; normal muscle tone throughout.  Sensory: Reduced vibratory sensation distally in both lower extremities.  Deep Tendon Reflexes: 2+ and symmetric.  Plantars: Flexor bilaterally  Cerebellar: Normal finger-to-nose testing.  Carotid auscultation: Normal   ASSESSMENT Laurie [name] is a 67 y.o. female presenting with slurred speech, left hemiparesis. CT Imaging does not show new infarct. MR imaging pending. Cause felt to be secondary to small vessel disease.  On no antithrombotics prior to admission. Now on aspirin 325 mg orally every day for secondary stroke prevention. Patient with resultant left hemiparesis. Work up underway.   Hypertension  Diabetes mellitus, type 2, controlled,  HGB A1C  7.1  LDL, 83, goal < 70 in diabetics, start statin. Aggressive diet control: low fat, carb controlled.   Hospital day # 1  TREATMENT/PLAN  Continue  aspirin 325 mg orally every day for secondary stroke prevention.  Statin started.  MR/echo/carotid pending   CIR recommended. I placed consult.  Gwendolyn Lima. Manson Passey, [facility], MBA, MHA [facility] Pager: [phone] 10/25/2013 8:36 AM  I have personally obtained a history, examined the patient, evaluated imaging results, and formulated the assessment and plan of care. I agree with the above. Delia Heady, MD

## 2013-10-25 NOTE — Evaluation (Signed)
Physical Therapy Evaluation Patient Details Name: Laurie [name] MRN: [id number] DOB: 29-Dec-1945 Today's Date: 10/25/2013 Time: 4098-1191 PT Time Calculation (min): 15 min  PT Assessment / Plan / Recommendation History of Present Illness  Laurie [name] is a 67 y.o. female with Past medical history of hypertension and diabetes mellitus.She presented today with her daughters the complaint of aphasia and left-sided weakness. The symptoms started at around last night by the patient. As per the daughter's they noted facial droop around 10:30 last night. He denies any fall or trauma or headache or chest pain or palpitation. She also denies any dizziness or lightheadedness. There is no incontinence of bowel or bladder. No fever no burning urination no cough no diarrhea. She is not taking any medication at present. She continues to smoke at present half pack a day. She mentions that her symptoms as primarily remain about the same ever since they started. She denies any similar symptoms in the past.     Clinical Impression  Pt currently requires min A with gait, balance and mobility.  May benefit from CIR as she needs to be mod I at home because daughter works during the day. Pt will benefit from continued PT services to address deficits and increase functional independence.    PT Assessment  Patient needs continued PT services    Follow Up Recommendations  CIR    Does the patient have the potential to tolerate intense rehabilitation      Barriers to Discharge Decreased caregiver support only intermittent assist available, daughter works    IT sales professional Recommendations  None recommended by PT    Recommendations for [facility] [facility] consult   Frequency Min 5X/week    Precautions / Restrictions Precautions Precautions: Fall Restrictions Weight Bearing Restrictions: No   Pertinent Vitals/Pain No c/o pain.  Pt states she has history of L hip arthritis which bothers her sometimes       Mobility  Transfers Transfers: Sit to Stand;Stand to Sit Sit to Stand: 4: Min assist Stand to Sit: 4: Min assist Details for Transfer Assistance: cues for UE placement, manual facilitation for anterior wt shift Ambulation/Gait Ambulation/Gait Assistance: 4: Min assist Ambulation Distance (Feet): 50 Feet Assistive device: 1 person hand held assist Ambulation/Gait Assistance Details: Pt with slight L knee buckling but able to correct, requires min A L HHA with assistance needed for balance.  Decreased cadence, narrow BOS    Exercises     PT Diagnosis: Difficulty walking;Abnormality of gait;Hemiplegia dominant side  PT Problem List: Decreased strength;Decreased activity tolerance;Decreased balance;Decreased mobility PT Treatment Interventions: DME instruction;Gait training;Patient/family education;Stair training;Functional mobility training;Wheelchair mobility training;Therapeutic activities;Therapeutic exercise;Balance training;Neuromuscular re-education     PT Goals(Current goals can be found in the care plan section) Acute Rehab PT Goals Patient Stated Goal: go home PT Goal Formulation: With patient Time For Goal Achievement: 10/25/13 Potential to Achieve Goals: Good  Visit Information  Last PT Received On: 10/25/13 Assistance Needed: +1 History of Present Illness: Laurie [name] is a 67 y.o. female with Past medical history of hypertension and diabetes mellitus.She presented today with her daughters the complaint of aphasia and left-sided weakness. The symptoms started at around last night by the patient. As per the daughter's they noted facial droop around 10:30 last night. He denies any fall or trauma or headache or chest pain or palpitation. She also denies any dizziness or lightheadedness. There is no incontinence of bowel or bladder. No fever no burning urination no cough no diarrhea. She is not taking  any medication at present. She continues to smoke at present half pack  a day. She mentions that her symptoms as primarily remain about the same ever since they started. She denies any similar symptoms in the past.          Prior Functioning  Home Living Family/patient expects to be discharged to:: Private residence Living Arrangements: Children Available Help at Discharge: Family;Available PRN/intermittently Type of Home: Apartment Home Access: Stairs to enter Entrance Stairs-Number of Steps: 2 Home Layout: One level Prior Function Level of Independence: Independent Communication Communication:  (dysarthria)    Cognition  Cognition Arousal/Alertness: Awake/alert Behavior During Therapy: WFL for tasks assessed/performed Overall Cognitive Status: Within Functional Limits for tasks assessed    Extremity/Trunk Assessment Lower Extremity Assessment Lower Extremity Assessment: LLE deficits/detail LLE Deficits / Details: grossly 3-/5 knee and hip, 2/5 ankle LLE Sensation:  (feels "numb") Cervical / Trunk Assessment Cervical / Trunk Assessment: Normal   Balance Static Standing Balance Static Standing - Balance Support: During functional activity Static Standing - Level of Assistance: 4: Min assist Dynamic Standing Balance Dynamic Standing - Balance Support: During functional activity Dynamic Standing - Level of Assistance: 3: Mod assist  End of Session PT - End of Session Equipment Utilized During Treatment: Gait belt Activity Tolerance: Patient tolerated treatment well Patient left: in bed;with call bell/phone within reach;with bed alarm set  GP     Darol Cush 10/25/2013, 8:51 AM

## 2013-10-25 NOTE — Progress Notes (Signed)
VASCULAR LAB   10/25/2013 AT 3:00pm  Arrived at room to perform carotid study.  Patient with echo, and transport waiting to take patient to MR.  Study will be done tomorrow.  Thereasa Parkin, RVT 10/25/2013 3:28 PM

## 2013-10-25 NOTE — H&P (Signed)
[facility] History and Physical  Patient: Laurie [name]  ZOX:[id number]  DOB: 05-17-1946  DOA: 10/24/2013  Referring physician: Dr. Lavella Lemons PCP: No primary provider on file.  Consults:   neurology Dr. Roseanne Reno  Chief Complaint: Aphasia  HPI: Laurie [name] is a 67 y.o. female with Past medical history of hypertension and diabetes mellitus. She presented today with her daughters the complaint of aphasia and left-sided weakness. The symptoms started at around last night by the patient. As per the daughter's they noted facial droop around 10:30 last night. He denies any fall or trauma or headache or chest pain or palpitation. She also denies any dizziness or lightheadedness. There is no incontinence of bowel or bladder. No fever no burning urination no cough no diarrhea. She is not taking any medication at present. She continues to smoke at present half pack a day. She mentions that her symptoms as primarily remain about the same ever since they started. She denies any similar symptoms in the past.      Review of Systems: as mentioned in the history of present illness.  A Comprehensive review of the other systems is negative.  Past Medical History  Diagnosis Date  . Hypertension   . Diabetes mellitus   . Chest pain     Troponins up to 0.12 while hospitalized for E.coli PNA  . 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 Bacteremia   . Anemia    History reviewed. No pertinent past surgical history. Social History:  reports that she has been smoking.  She does not have any smokeless tobacco history on file. She reports that she does not drink alcohol or use illicit drugs. Patient is coming from home. Independent for most of her  ADL.  Allergies  Allergen Reactions  . Penicillins     History reviewed. No pertinent family history.  Prior to Admission medications   Medication Sig Start Date End Date Taking? Authorizing Provider  co-enzyme Q-10 30 MG capsule Take  100 mg by mouth 2 (two) times daily.   Yes Historical Provider, MD  ibuprofen (ADVIL,MOTRIN) 100 MG tablet Take 400 mg by mouth every 6 (six) hours as needed for pain (pain in hip).   Yes Historical Provider, MD    Physical Exam: Filed Vitals:   10/25/13 0000 10/25/13 0045 10/25/13 0101 10/25/13 0115  BP: 161/67 162/76  165/76  Pulse: 63 70  66  Temp:   98.7 F (37.1 C)   Resp: 17 22  17   SpO2: 96% 98%  99%    General: Alert, Awake and Oriented to Time, Place and Person. Appear in mild distress Eyes: PERRL ENT: Oral Mucosa clear moist, right facial droop . Neck: no JVD, n Carotid Bruits  Cardiovascular: S1 and S2 Present, no Murmur, Peripheral Pulses Present Respiratory: Bilateral Air entry equal and Decreased, Clear to Auscultation,  no Crackles,no wheezes Abdomen: Bowel Sound Present, Soft and Non tender Skin: no Rash Extremities: Trace Pedal edema, no calf tenderness Neurologic: Mental status alert awake and oriented, Cranial Nerves intact, Motor strength mild weakness on the left side, Sensation intact, reflexes intact, babinski equivocal, Proprioception intact, Cerebellar test intact.   Labs on Admission:  CBC:  Recent Labs Lab 10/24/13 2300  WBC 8.8  NEUTROABS 3.8  HGB 12.7  HCT 36.3  MCV 92.1  PLT 271    CMP     Component Value Date/Time   NA 138 10/24/2013 2300   K 3.5 10/24/2013  2300   CL 105 10/24/2013 2300   CO2 22 10/24/2013 2300   GLUCOSE 130* 10/24/2013 2300   BUN 13 10/24/2013 2300   CREATININE 0.66 10/24/2013 2300   CALCIUM 9.1 10/24/2013 2300   PROT 7.0 10/24/2013 2300   ALBUMIN 3.7 10/24/2013 2300   AST 18 10/24/2013 2300   ALT 8 10/24/2013 2300   ALKPHOS 75 10/24/2013 2300   BILITOT 0.2* 10/24/2013 2300   GFRNONAA >90 10/24/2013 2300   GFRAA >90 10/24/2013 2300    No results found for this basename: LIPASE, AMYLASE,  in the last 168 hours No results found for this basename: AMMONIA,  in the last 168 hours  Cardiac Enzymes:  Recent Labs Lab  10/24/13 2300  TROPONINI <0.30    BNP (last 3 results) No results found for this basename: PROBNP,  in the last 8760 hours  Radiological Exams on Admission: Ct Head (brain) Wo Contrast  10/24/2013   CLINICAL DATA:  Slurred speech. Facial drooping.  EXAM: CT HEAD WITHOUT CONTRAST  TECHNIQUE: Contiguous axial images were obtained from the base of the skull through the vertex without intravenous contrast.  COMPARISON:  Head CT 09/02/2011.  FINDINGS: Patchy and confluent areas of decreased attenuation are noted throughout the deep and periventricular white matter of the cerebral hemispheres bilaterally, compatible with chronic microvascular ischemic disease. No acute intracranial abnormalities. Specifically, no evidence of acute intracranial hemorrhage, no definite findings of acute/subacute cerebral ischemia, no mass, mass effect, hydrocephalus or abnormal intra or extra-axial fluid collections. Visualized paranasal sinuses and mastoids are well pneumatized, with exception a small polypoid lesion in the posterior aspect of the right maxillary sinus. No acute displaced skull fractures are identified.  IMPRESSION: 1. No acute intracranial abnormalities. 2. Mild chronic microvascular ischemic changes in the cerebral white matter, as above. 3. Small polypoid lesion in the posterior aspect of the right maxillary sinus may represent a mucosal retention cyst or nasal polyp.   Electronically Signed   By: Trudie Reed M.D.   On: 10/24/2013 23:21    EKG: Independently reviewed. normal sinus rhythm.  Assessment/Plan Principal Problem:   CVA (cerebral infarction) Active Problems:   DIABETES MELLITUS, UNCONTROLLED   Arthralgia of hip   1. CVA (cerebral infarction) The patient is present with complaint of slurred speech and left-sided weakness. Her initial CT scan is negative. She has history of diabetes mellitus and high blood pressure. With her symptoms persisting beyond 24 hours we would admit her  to the hospital. Monitor her on telemetry. Serial neurocheck   Will be done. Neurology will be consulted. MRI of the brain will be obtained as well as echocardiogram and carotid Doppler. Full dose aspirin and Lipitor will be initiated at present.  2. diabetes mellitus  We will check HbA1c  Place the patient on sliding scale   3.hip arthralgia  Pacing the patient on tramadol    DVT Prophylaxis: subcutaneous Heparin Nutrition:  cardiac and diabetic diet   Code Status:  full   Family Communication:  daughter and sister  was present at bedside, opportunity was given to the family to ask question and all questions were answered satisfactorily at the time of interview. Disposition: Admitted to observation in telemetry.  Author: Lynden Oxford, MD [facility] Pager: [phone] 10/25/2013, 3:39 AM    If 7PM-7AM, please contact night-coverage [email or web address] Password [email or web address]

## 2013-10-25 NOTE — Evaluation (Signed)
Occupational Therapy Evaluation Patient Details Name: Laurie [name] MRN: [id number] DOB: Nov 15, 1946 Today's Date: 10/25/2013 Time: 1308-6578 OT Time Calculation (min): 25 min  OT Assessment / Plan / Recommendation History of present illness Laurie [name] is a 67 y.o. female with Past medical history of hypertension and diabetes mellitus.She presented today with her daughters the complaint of aphasia and left-sided weakness. The symptoms started at around last night by the patient. As per the daughter's they noted facial droop around 10:30 last night. He denies any fall or trauma or headache or chest pain or palpitation. She also denies any dizziness or lightheadedness. There is no incontinence of bowel or bladder. No fever no burning urination no cough no diarrhea. She is not taking any medication at present. She continues to smoke at present half pack a day. She mentions that her symptoms as primarily remain about the same ever since they started. She denies any similar symptoms in the past.      Clinical Impression   Pt presents with below problem list. Pt independent with ADLs, PTA. Pt will benefit from acute OT to increase independence prior to d/c. Recommending CIR for additional rehab as pt needs to be Mod I before d/c home as she does not have 24/7 supervision/assistance.     OT Assessment  Patient needs continued OT Services    Follow Up Recommendations  CIR;Supervision/Assistance - 24 hour    Barriers to Discharge Decreased caregiver support    Equipment Recommendations  3 in 1 bedside comode;Tub/shower bench    Recommendations for Other Services Rehab consult  Frequency  Min 2X/week    Precautions / Restrictions Precautions Precautions: Fall Restrictions Weight Bearing Restrictions: No   Pertinent Vitals/Pain No pain reported.     ADL  Eating/Feeding: Minimal assistance Where Assessed - Eating/Feeding: Chair Grooming: Minimal assistance Where Assessed - Grooming:  Supported standing Upper Body Bathing: Moderate assistance Where Assessed - Upper Body Bathing: Supported sitting Lower Body Bathing: Minimal assistance Where Assessed - Lower Body Bathing: Supported sit to stand Upper Body Dressing: Moderate assistance Where Assessed - Upper Body Dressing: Supported sitting Lower Body Dressing: Moderate assistance Where Assessed - Lower Body Dressing: Supported sit to Pharmacist, hospital: Minimal assistance Toilet Transfer Method: Sit to stand (stand to sit) Acupuncturist: Comfort height toilet;Grab bars Toileting - Clothing Manipulation and Hygiene: Min guard Where Assessed - Toileting Clothing Manipulation and Hygiene: Standing;Sit on 3-in-1 or toilet (clothing-standing and hygiene-sitting) Tub/Shower Transfer Method: Not assessed Equipment Used: Gait belt Transfers/Ambulation Related to ADLs: Min A ADL Comments: Educated to be moving fingers throughout day and using right hand to assist as needed. Also, educated and demonstrated retrograde massage on left hand as it had edema. Educated for pt to use left hand during activities and told her to "quiet her shoulder." Assisted pt in grooming at sink as she was trying to use left hand.     OT Diagnosis:    OT Problem List: Decreased strength;Increased edema;Impaired UE functional use;Impaired sensation;Decreased knowledge of precautions;Decreased knowledge of use of DME or AE;Decreased coordination;Decreased cognition;Impaired balance (sitting and/or standing);Decreased activity tolerance OT Treatment Interventions: Self-care/ADL training;Therapeutic exercise;Neuromuscular education;DME and/or AE instruction;Therapeutic activities;Cognitive remediation/compensation;Patient/family education;Balance training;Visual/perceptual remediation/compensation   OT Goals(Current goals can be found in the care plan section) Acute Rehab OT Goals Patient Stated Goal: go home OT Goal Formulation: With  patient Time For Goal Achievement: 11/01/13 Potential to Achieve Goals: Good ADL Goals Pt Will Perform Eating: with set-up;sitting Pt Will Perform Grooming: with set-up;with  supervision;standing Pt Will Perform Upper Body Bathing: with set-up;with supervision;sitting Pt Will Perform Lower Body Bathing: with supervision;with set-up;sit to/from stand Pt Will Perform Upper Body Dressing: with set-up;with supervision;sitting Pt Will Perform Lower Body Dressing: with set-up;with supervision;sit to/from stand Pt Will Transfer to Toilet: with modified independence (sit <> stand; 3 in 1 over commode) Pt Will Perform Toileting - Clothing Manipulation and hygiene: with modified independence;sit to/from stand Pt Will Perform Tub/Shower Transfer: Tub transfer;with supervision;with set-up;ambulating;tub bench Additional ADL Goal #1: Pt will be independent with HEP of LUE to increase strength.  Additional ADL Goal #2: Pt will be independent with edema management techniques for Lt hand.  Visit Information  Last OT Received On: 10/25/13 Assistance Needed: +1 History of Present Illness: Laurie [name] is a 67 y.o. female with Past medical history of hypertension and diabetes mellitus.She presented today with her daughters the complaint of aphasia and left-sided weakness. The symptoms started at around last night by the patient. As per the daughter's they noted facial droop around 10:30 last night. He denies any fall or trauma or headache or chest pain or palpitation. She also denies any dizziness or lightheadedness. There is no incontinence of bowel or bladder. No fever no burning urination no cough no diarrhea. She is not taking any medication at present. She continues to smoke at present half pack a day. She mentions that her symptoms as primarily remain about the same ever since they started. She denies any similar symptoms in the past.          Prior Functioning     Home Living Family/patient expects  to be discharged to:: Private residence Living Arrangements: Children Available Help at Discharge: Family;Available PRN/intermittently Type of Home: Apartment Home Access: Stairs to enter Entrance Stairs-Number of Steps: 2 Home Layout: One level Prior Function Level of Independence: Independent Communication Communication: Other (comment) (dysarthria)         Vision/Perception Vision - History Visual History: Cataracts;Other (comment) (cataract in Rt eye) Patient Visual Report: No change from baseline Vision - Assessment Vision Assessment: Vision tested Tracking/Visual Pursuits: Other (comment) (difficult in tracking on left side) Convergence: Within functional limits Visual Fields: Other (comment) (inconsistent) Additional Comments: vision to be further assessed   Cognition  Cognition Arousal/Alertness: Awake/alert Behavior During Therapy: WFL for tasks assessed/performed Overall Cognitive Status: Impaired/Different from baseline Area of Impairment: Attention Current Attention Level: Sustained General Comments: pt very talkative requiring cues to remain on task    Extremity/Trunk Assessment Upper Extremity Assessment Upper Extremity Assessment: LUE deficits/detail LUE Deficits / Details: less than 90 degrees AROM shoulder flexion LUE Sensation: decreased light touch LUE Coordination: decreased fine motor Lower Extremity Assessment Lower Extremity Assessment: LLE deficits/detail LLE Deficits / Details: grossly 3-/5 knee and hip, 2/5 ankle LLE Sensation:  (feels "numb") Cervical / Trunk Assessment Cervical / Trunk Assessment: Normal     Mobility Bed Mobility Bed Mobility: Supine to Sit Supine to Sit: 5: Supervision Transfers Transfers: Sit to Stand;Stand to Sit Sit to Stand: 4: Min assist;With upper extremity assist;From bed;From toilet;From chair/3-in-1 Stand to Sit: 4: Min assist;With upper extremity assist;To chair/3-in-1;To toilet Details for Transfer  Assistance: Assisted in LUE placement during toilet transfer-cues for technique.      Exercise        End of Session OT - End of Session Equipment Utilized During Treatment: Gait belt Activity Tolerance: Patient tolerated treatment well Patient left: Other (comment) (on commode with tech) Nurse Communication: Other (comment) (in bathroom with tech)  GO Functional  Assessment Tool Used: clinical judgment Functional Limitation: Self care Self Care Current Status [phone]): At least 20 percent but less than 40 percent impaired, limited or restricted Self Care Goal Status (N6295): At least 1 percent but less than 20 percent impaired, limited or restricted   Earlie Raveling OTR/L [phone] 10/25/2013, 10:17 AM

## 2013-10-26 ENCOUNTER — Inpatient Hospital Stay (HOSPITAL_COMMUNITY)
Admission: AD | Admit: 2013-10-26 | Discharge: 2013-11-05 | DRG: 945 | Disposition: A | Discharge status: Home Health | Payer: Medicare Other | Source: Intra-hospital | Attending: Physical Medicine & Rehabilitation | Admitting: Physical Medicine & Rehabilitation

## 2013-10-26 ENCOUNTER — Encounter (HOSPITAL_COMMUNITY): Payer: Self-pay | Admitting: Physical Medicine and Rehabilitation

## 2013-10-26 DIAGNOSIS — I6330 Cerebral infarction due to thrombosis of unspecified cerebral artery: Secondary | ICD-10-CM

## 2013-10-26 DIAGNOSIS — K5900 Constipation, unspecified: Secondary | ICD-10-CM

## 2013-10-26 DIAGNOSIS — Z5189 Encounter for other specified aftercare: Principal | ICD-10-CM

## 2013-10-26 DIAGNOSIS — Z880 Allergy status to penicillin: Secondary | ICD-10-CM

## 2013-10-26 DIAGNOSIS — Z8249 Family history of ischemic heart disease and other diseases of the circulatory system: Secondary | ICD-10-CM

## 2013-10-26 DIAGNOSIS — E1149 Type 2 diabetes mellitus with other diabetic neurological complication: Secondary | ICD-10-CM

## 2013-10-26 DIAGNOSIS — M25559 Pain in unspecified hip: Secondary | ICD-10-CM

## 2013-10-26 DIAGNOSIS — E876 Hypokalemia: Secondary | ICD-10-CM

## 2013-10-26 DIAGNOSIS — D649 Anemia, unspecified: Secondary | ICD-10-CM

## 2013-10-26 DIAGNOSIS — R471 Dysarthria and anarthria: Secondary | ICD-10-CM

## 2013-10-26 DIAGNOSIS — I10 Essential (primary) hypertension: Secondary | ICD-10-CM

## 2013-10-26 DIAGNOSIS — G8190 Hemiplegia, unspecified affecting unspecified side: Secondary | ICD-10-CM

## 2013-10-26 DIAGNOSIS — M169 Osteoarthritis of hip, unspecified: Secondary | ICD-10-CM

## 2013-10-26 DIAGNOSIS — Z91199 Patient's noncompliance with other medical treatment and regimen due to unspecified reason: Secondary | ICD-10-CM

## 2013-10-26 DIAGNOSIS — E1165 Type 2 diabetes mellitus with hyperglycemia: Secondary | ICD-10-CM

## 2013-10-26 DIAGNOSIS — R3915 Urgency of urination: Secondary | ICD-10-CM

## 2013-10-26 DIAGNOSIS — M1610 Unilateral primary osteoarthritis, unspecified hip: Secondary | ICD-10-CM

## 2013-10-26 DIAGNOSIS — I5032 Chronic diastolic (congestive) heart failure: Secondary | ICD-10-CM

## 2013-10-26 DIAGNOSIS — F411 Generalized anxiety disorder: Secondary | ICD-10-CM

## 2013-10-26 DIAGNOSIS — F17200 Nicotine dependence, unspecified, uncomplicated: Secondary | ICD-10-CM

## 2013-10-26 DIAGNOSIS — M25552 Pain in left hip: Secondary | ICD-10-CM

## 2013-10-26 DIAGNOSIS — N289 Disorder of kidney and ureter, unspecified: Secondary | ICD-10-CM

## 2013-10-26 DIAGNOSIS — E785 Hyperlipidemia, unspecified: Secondary | ICD-10-CM

## 2013-10-26 DIAGNOSIS — I6350 Cerebral infarction due to unspecified occlusion or stenosis of unspecified cerebral artery: Secondary | ICD-10-CM

## 2013-10-26 DIAGNOSIS — R292 Abnormal reflex: Secondary | ICD-10-CM

## 2013-10-26 DIAGNOSIS — Z7982 Long term (current) use of aspirin: Secondary | ICD-10-CM

## 2013-10-26 DIAGNOSIS — R29810 Facial weakness: Secondary | ICD-10-CM

## 2013-10-26 DIAGNOSIS — Z9119 Patient's noncompliance with other medical treatment and regimen: Secondary | ICD-10-CM

## 2013-10-26 DIAGNOSIS — Z79899 Other long term (current) drug therapy: Secondary | ICD-10-CM

## 2013-10-26 DIAGNOSIS — E119 Type 2 diabetes mellitus without complications: Secondary | ICD-10-CM

## 2013-10-26 DIAGNOSIS — G47419 Narcolepsy without cataplexy: Secondary | ICD-10-CM

## 2013-10-26 DIAGNOSIS — M76899 Other specified enthesopathies of unspecified lower limb, excluding foot: Secondary | ICD-10-CM

## 2013-10-26 DIAGNOSIS — I639 Cerebral infarction, unspecified: Secondary | ICD-10-CM | POA: Diagnosis present

## 2013-10-26 LAB — GLUCOSE, CAPILLARY
Glucose-Capillary: 100 mg/dL — ABNORMAL HIGH (ref 70–99)
Glucose-Capillary: 136 mg/dL — ABNORMAL HIGH (ref 70–99)
Glucose-Capillary: 124 mg/dL — ABNORMAL HIGH (ref 70–99)

## 2013-10-26 MED ORDER — MUPIROCIN 2 % EX OINT
TOPICAL_OINTMENT | Freq: Two times a day (BID) | CUTANEOUS | Status: DC
  Administered 2013-10-26: 15:00:00 via TOPICAL
  Filled 2013-10-26: qty 22

## 2013-10-26 MED ORDER — INSULIN ASPART 100 UNIT/ML ~~LOC~~ SOLN: 0.0000 [IU] | Freq: Every day | SUBCUTANEOUS | Status: DC

## 2013-10-26 MED ORDER — SENNOSIDES-DOCUSATE SODIUM 8.6-50 MG PO TABS
1.0000 | ORAL_TABLET | Freq: Every evening | ORAL | Status: DC | PRN
  Administered 2013-10-28: 1 via ORAL
  Filled 2013-10-26 (×2): qty 1

## 2013-10-26 MED ORDER — GUAIFENESIN-DM 100-10 MG/5ML PO SYRP: 5.0000 mL | ORAL_SOLUTION | Freq: Four times a day (QID) | ORAL | Status: DC | PRN

## 2013-10-26 MED ORDER — METFORMIN HCL 500 MG PO TABS: 500.0000 mg | ORAL_TABLET | Freq: Two times a day (BID) | ORAL | Status: DC

## 2013-10-26 MED ORDER — PROCHLORPERAZINE MALEATE 5 MG PO TABS
5.0000 mg | ORAL_TABLET | Freq: Four times a day (QID) | ORAL | Status: DC | PRN
  Filled 2013-10-26: qty 2

## 2013-10-26 MED ORDER — TRAZODONE HCL 50 MG PO TABS
25.0000 mg | ORAL_TABLET | Freq: Every evening | ORAL | Status: DC | PRN
  Administered 2013-11-04: 50 mg via ORAL
  Filled 2013-10-26: qty 1

## 2013-10-26 MED ORDER — PROCHLORPERAZINE 25 MG RE SUPP
12.5000 mg | Freq: Four times a day (QID) | RECTAL | Status: DC | PRN
  Filled 2013-10-26: qty 1

## 2013-10-26 MED ORDER — BISACODYL 10 MG RE SUPP
10.0000 mg | Freq: Every day | RECTAL | Status: DC | PRN
  Filled 2013-10-26: qty 1

## 2013-10-26 MED ORDER — TRAMADOL HCL 50 MG PO TABS
50.0000 mg | ORAL_TABLET | Freq: Four times a day (QID) | ORAL | Status: DC | PRN
  Administered 2013-10-26 – 2013-11-05 (×18): 50 mg via ORAL
  Filled 2013-10-26 (×19): qty 1

## 2013-10-26 MED ORDER — ALUM & MAG HYDROXIDE-SIMETH 200-200-20 MG/5ML PO SUSP: 30.0000 mL | ORAL | Status: DC | PRN

## 2013-10-26 MED ORDER — ATORVASTATIN CALCIUM 10 MG PO TABS
10.0000 mg | ORAL_TABLET | Freq: Every day | ORAL | Status: DC
  Administered 2013-10-26 – 2013-11-04 (×10): 10 mg via ORAL
  Filled 2013-10-26 (×12): qty 1

## 2013-10-26 MED ORDER — NAPROXEN 500 MG PO TABS
500.0000 mg | ORAL_TABLET | Freq: Two times a day (BID) | ORAL | Status: DC | PRN
  Administered 2013-10-29 – 2013-10-30 (×2): 500 mg via ORAL
  Filled 2013-10-26 (×6): qty 1

## 2013-10-26 MED ORDER — METFORMIN HCL 500 MG PO TABS
500.0000 mg | ORAL_TABLET | Freq: Two times a day (BID) | ORAL | Status: DC
  Administered 2013-10-26 – 2013-11-05 (×20): 500 mg via ORAL
  Filled 2013-10-26 (×22): qty 1

## 2013-10-26 MED ORDER — HYDROCHLOROTHIAZIDE 12.5 MG PO CAPS
12.5000 mg | ORAL_CAPSULE | Freq: Every day | ORAL | Status: DC
  Administered 2013-10-26: 12.5 mg via ORAL
  Filled 2013-10-26 (×2): qty 1

## 2013-10-26 MED ORDER — LISINOPRIL 5 MG PO TABS
5.0000 mg | ORAL_TABLET | Freq: Every day | ORAL | Status: DC
  Administered 2013-10-27 – 2013-10-30 (×4): 5 mg via ORAL
  Filled 2013-10-26 (×6): qty 1

## 2013-10-26 MED ORDER — PROCHLORPERAZINE EDISYLATE 5 MG/ML IJ SOLN
5.0000 mg | Freq: Four times a day (QID) | INTRAMUSCULAR | Status: DC | PRN
  Filled 2013-10-26: qty 2

## 2013-10-26 MED ORDER — FLEET ENEMA 7-19 GM/118ML RE ENEM: 1.0000 | ENEMA | Freq: Once | RECTAL | Status: AC | PRN

## 2013-10-26 MED ORDER — METFORMIN HCL 500 MG PO TABS
500.0000 mg | ORAL_TABLET | Freq: Two times a day (BID) | ORAL | Status: DC
  Filled 2013-10-26 (×2): qty 1

## 2013-10-26 MED ORDER — ASPIRIN 325 MG PO TABS: 325.0000 mg | ORAL_TABLET | Freq: Every day | ORAL | Status: DC

## 2013-10-26 MED ORDER — TRAMADOL HCL 50 MG PO TABS: 50.0000 mg | ORAL_TABLET | Freq: Four times a day (QID) | ORAL | Status: DC | PRN

## 2013-10-26 MED ORDER — HYDROCHLOROTHIAZIDE 12.5 MG PO CAPS
12.5000 mg | ORAL_CAPSULE | Freq: Every day | ORAL | Status: DC
  Administered 2013-10-27 – 2013-11-05 (×10): 12.5 mg via ORAL
  Filled 2013-10-26 (×12): qty 1

## 2013-10-26 MED ORDER — INSULIN ASPART 100 UNIT/ML ~~LOC~~ SOLN
0.0000 [IU] | Freq: Three times a day (TID) | SUBCUTANEOUS | Status: DC
  Administered 2013-10-26: 1 [IU] via SUBCUTANEOUS
  Administered 2013-10-27: 2 [IU] via SUBCUTANEOUS
  Administered 2013-10-27 – 2013-10-28 (×2): 1 [IU] via SUBCUTANEOUS
  Administered 2013-10-29: 2 [IU] via SUBCUTANEOUS
  Administered 2013-10-30 – 2013-10-31 (×2): 1 [IU] via SUBCUTANEOUS
  Administered 2013-10-31: 2 [IU] via SUBCUTANEOUS
  Administered 2013-11-01: 1 [IU] via SUBCUTANEOUS
  Administered 2013-11-01 – 2013-11-04 (×4): 2 [IU] via SUBCUTANEOUS
  Administered 2013-11-05: 1 [IU] via SUBCUTANEOUS

## 2013-10-26 MED ORDER — ASPIRIN 325 MG PO TABS
325.0000 mg | ORAL_TABLET | Freq: Every day | ORAL | Status: DC
  Administered 2013-10-27 – 2013-11-05 (×10): 325 mg via ORAL
  Filled 2013-10-26 (×12): qty 1

## 2013-10-26 MED ORDER — ATORVASTATIN CALCIUM 10 MG PO TABS: 10.0000 mg | ORAL_TABLET | Freq: Every day | ORAL | Status: DC

## 2013-10-26 MED ORDER — ENOXAPARIN SODIUM 40 MG/0.4ML ~~LOC~~ SOLN
40.0000 mg | SUBCUTANEOUS | Status: DC
  Administered 2013-10-27 – 2013-11-05 (×10): 40 mg via SUBCUTANEOUS
  Filled 2013-10-26 (×11): qty 0.4

## 2013-10-26 MED ORDER — DIPHENHYDRAMINE HCL 12.5 MG/5ML PO ELIX
12.5000 mg | ORAL_SOLUTION | Freq: Four times a day (QID) | ORAL | Status: DC | PRN
  Filled 2013-10-26: qty 10

## 2013-10-26 MED ORDER — MUPIROCIN 2 % EX OINT: TOPICAL_OINTMENT | Freq: Two times a day (BID) | CUTANEOUS | Status: DC

## 2013-10-26 MED ORDER — LISINOPRIL-HYDROCHLOROTHIAZIDE 10-12.5 MG PO TABS: 1.0000 | ORAL_TABLET | Freq: Every day | ORAL | Status: DC

## 2013-10-26 MED ORDER — ACETAMINOPHEN 325 MG PO TABS
325.0000 mg | ORAL_TABLET | ORAL | Status: DC | PRN
  Administered 2013-10-31: 650 mg via ORAL
  Filled 2013-10-26: qty 2

## 2013-10-26 NOTE — Progress Notes (Signed)
Discharge orders received, pt for discharge to CIR today. IV D/C D/C instructions and Rx given with verbalized understanding to CIR RN. Staff brought pt to CIR via wheelchair.

## 2013-10-26 NOTE — Progress Notes (Signed)
Bilateral carotid artery duplex:  1-39% ICA stenosis.  Vertebral artery flow is antegrade.     

## 2013-10-26 NOTE — PMR Pre-admission (Signed)
PMR Admission Coordinator Pre-Admission Assessment  Patient: Laurie [name] is an 67 y.o., female MRN: [id number] DOB: 08-26-1946 Height: 5\' 8"  (172.7 cm) Weight: 80.1 kg (176 lb 9.4 oz)              Insurance Information HMO:      PPO:       PCP:       IPA:       80/20:       OTHER:   PRIMARY: Medicare A/B      Policy#: [id number]      Subscriber: Haskell Flirt CM Name:        Phone#:       Fax#:   Pre-Cert#:        Employer: Disabled Benefits:  Phone #:       Name: Armed forces technical officer. Date: 02/20/12     Deduct: $1216      Out of Pocket Max: none      Life Max: unlimited CIR: 100%      SNF: 100 days Outpatient:  80%     Co-Pay: 20% Home Health: 100%      Co-Pay: none DME: 80%     Co-Pay: 20% Providers: patient's choice  SECONDARY: Medicaid      Policy#: [id number]      Subscriber: Haskell Flirt CM Name:        Phone#:       Fax#:   Pre-Cert#:        Employer: Disabled Benefits:  Phone #: [phone]     Name:   Eff. Date: Verified 10/26/13 pays medicare part B premiums only     Deduct:        Out of Pocket Max:        Life Max:   CIR:        SNF:   Outpatient:       Co-Pay:   Home Health:        Co-Pay:   DME:       Co-Pay:    Emergency Contact Information Contact Information   Name Relation Home Work Mobile   [place] Daughter [phone]  [phone]   Zenia Resides [phone]       Current Medical History  Patient Admitting Diagnosis:  R pontine infarct  History of Present Illness: A 67 y.o. right-handed female with history of hypertension as well as diabetes mellitus with peripheral neuropathy and tobacco abuse. Admitted 10/24/2013 with left-sided weakness and slurred speech. Cranial CT scan negative. MRI of the brain showed acute nonhemorrhagic infarct anterior right pons as well as remote lacunar infarct posterior right lentiform nucleus. 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  Echocardiogram with ejection fraction of 60% grade 2 diastolic dysfunction. Carotid Dopplers done 10/26/13. Neurology followup with workup ongoing. Patient did not receive TPA. Placed on aspirin therapy for CVA prophylaxis as well as subcutaneous Lovenox for DVT prophylaxis. Physical and occupational therapy evaluations completed 10/25/2012 with recommendations of physical medicine rehabilitation consult to consider inpatient rehabilitation services.      Total: 7=NIH  Past Medical History  Past Medical History  Diagnosis Date  . Hypertension   . Diabetes mellitus   . Chest pain     Troponins up to 0.12 while hospitalized for E.coli PNA  . 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 Bacteremia   . Anemia   . Narcolepsy  Family History  family history includes CAD in her father and mother.  Prior Rehab/Hospitalizations: None   Current Medications  Current facility-administered medications:aspirin tablet 325 mg, 325 mg, Oral, Daily, Lynden Oxford, MD, 325 mg at 10/26/13 1059;  atorvastatin (LIPITOR) tablet 10 mg, 10 mg, Oral, q1800, Cathlyn Parsons, PA-C, 10 mg at 10/25/13 1651;  enoxaparin (LOVENOX) injection 40 mg, 40 mg, Subcutaneous, Q24H, Lynden Oxford, MD, 40 mg at 10/26/13 1059;  hydrochlorothiazide (MICROZIDE) capsule 12.5 mg, 12.5 mg, Oral, Daily, Renae Fickle, MD lisinopril (PRINIVIL,ZESTRIL) tablet 5 mg, 5 mg, Oral, Daily, Renae Fickle, MD, 5 mg at 10/26/13 1159;  metFORMIN (GLUCOPHAGE) tablet 500 mg, 500 mg, Oral, BID WC, Renae Fickle, MD;  mupirocin ointment (BACTROBAN) 2 %, , Topical, BID, Renae Fickle, MD;  naproxen (NAPROSYN) tablet 500 mg, 500 mg, Oral, BID PRN, Renae Fickle, MD;  traMADol Janean Sark) tablet 50 mg, 50 mg, Oral, Q6H PRN, Lynden Oxford, MD, 50 mg at 10/26/13 0981  Patients Current Diet: Carb Control  Precautions / Restrictions Precautions Precautions: Fall Restrictions Weight Bearing Restrictions: No   Prior Activity  Level Community (5-7x/wk): Went out daily.  Took grandchild to school and to bus stop.  Home Assistive Devices / Equipment Home Assistive Devices/Equipment: None  Prior Functional Level Prior Function Level of Independence: Independent  Current Functional Level Cognition  Overall Cognitive Status: Within Functional Limits for tasks assessed Current Attention Level: Sustained Orientation Level: Oriented X4 General Comments: pt very talkative requiring cues to remain on task    Extremity Assessment (includes Sensation/Coordination)          ADLs  Eating/Feeding: Minimal assistance Where Assessed - Eating/Feeding: Chair Grooming: Wash/dry face;Teeth care;Minimal assistance Where Assessed - Grooming: Supported standing;Supported sitting Upper Body Bathing: Minimal assistance Where Assessed - Upper Body Bathing: Supported sitting Lower Body Bathing: Minimal assistance Where Assessed - Lower Body Bathing: Supported sit to stand Upper Body Dressing: Minimal assistance Where Assessed - Upper Body Dressing: Supported sitting Lower Body Dressing: Minimal assistance (socks) Where Assessed - Lower Body Dressing: Supported sitting Toilet Transfer: Min Pension scheme manager Method: Sit to Barista: Raised toilet seat with arms (or 3-in-1 over toilet) Toileting - Clothing Manipulation and Hygiene: Min guard Where Assessed - Toileting Clothing Manipulation and Hygiene: Standing;Sit on 3-in-1 or toilet (clothing-standing and hygiene-sitting) Tub/Shower Transfer Method: Not assessed Equipment Used: Gait belt Transfers/Ambulation Related to ADLs: Min A for ambulation; Min guard for transfers. ADL Comments: OT assisted pt with using Left hand during ADL tasks today. Encouraged her to use LUE and "quiet" her shoulder. Reviewed retrograde massage technique and told her to prop left arm on pillow when sitting in chair and in bed. Pt tearful upon OT entering room today about  the face of her having a stroke.  Assisted pt in using left hand when preparing lunch and educated how she can use her left hand to support.  Educated on dressing technique.     Mobility  Bed Mobility: Supine to Sit Supine to Sit: 5: Supervision    Transfers  Transfers: Sit to Stand;Stand to Sit Sit to Stand: 4: Min guard;From bed;From chair/3-in-1 Stand to Sit: 4: Min guard;To chair/3-in-1    Ambulation / Gait / Stairs / Psychologist, prison and probation services  Ambulation/Gait Ambulation/Gait Assistance: 4: Min Environmental consultant (Feet): 50 Feet Assistive device: 1 person hand held assist Ambulation/Gait Assistance Details: Pt with slight L knee buckling but able to correct, requires min A L HHA with assistance needed for balance.  Decreased cadence, narrow  BOS    Posture / Balance Static Standing Balance Static Standing - Balance Support: During functional activity Static Standing - Level of Assistance: 4: Min assist Dynamic Standing Balance Dynamic Standing - Balance Support: During functional activity Dynamic Standing - Level of Assistance: 3: Mod assist    Special needs/care consideration BiPAP/CPAP No CPM No Continuous Drip IV No Dialysis No        Life Vest No Oxygen No Special Bed No Trach Size No Wound Vac (area) No     Skin No                              Bowel mgmt: Constipation prior to admission Bladder mgmt: Voiding in bathroom Diabetic mgmt Yes, on oral medications    Previous Home Environment Living Arrangements: Children Available Help at Discharge: Family;Available PRN/intermittently Type of Home: Apartment Home Layout: One level Home Access: Stairs to enter [employer] of Steps: 2 Bathroom Shower/Tub: Engineer, manufacturing systems: Standard Home Care Services: No  Discharge Living Setting Plans for Discharge Living Setting: Lives with (comment);Apartment (Lives with daughter and grandaughter 74 yo.) Type of Home at Discharge:  Apartment Discharge Home Layout: One level Discharge Home Access: Stairs to enter Entrance Stairs-Number of Steps: 2 very steep steps at entry. Does the patient have any problems obtaining your medications?: No  Social/Family/Support Systems Patient Roles: Parent (Has a daughter and a Information systems manager.) Contact Information: Joyia Riehle - daughter Anticipated Caregiver: self and daughter Anticipated Caregiver's Contact Information: Shanda Bumps [phone] Ability/Limitations of Caregiver: Daughter works 9a to 5 p.  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 Additional Information: Son deceased in 63 after a motorcycle accident. Pt/Family Agrees to Admission and willing to participate: Yes Program Orientation Provided & Reviewed with Pt/Caregiver Including Roles  & Responsibilities: Yes  Decrease burden of Care through IP rehab admission:  N/A  Possible need for SNF placement upon discharge: Not planned  Patient Condition: This patient's condition remains as documented in the consult dated 10/26/13, in which the Rehabilitation Physician determined and documented that the patient's condition is appropriate for intensive rehabilitative care in an inpatient rehabilitation facility. Will admit to inpatient rehab today.  Preadmission Screen Completed By:  Trish Mage, 10/26/2013 1:27 PM ______________________________________________________________________   Discussed status  with Dr. Riley Kill on 11/05/14at 1325 and received telephone approval for admission today.  Admission Coordinator:  Trish Mage, time1325/Date11/05/14

## 2013-10-26 NOTE — Progress Notes (Signed)
Seen and agreed 10/26/2013 Tashana Haberl Elizabeth PTA [phone] pager [phone] office    

## 2013-10-26 NOTE — Progress Notes (Signed)
Talked to patient about follow up medical care; patient stated that she does not have a PCP; information given to patient about [facility] and [facility]; lots of emotional support given; B Ave Filter RN,BSN,MHA

## 2013-10-26 NOTE — Progress Notes (Signed)
Occupational Therapy Treatment Patient Details Name: Laurie [name] MRN: [id number] DOB: 12-May-1946 Today's Date: 10/26/2013 Time: 4098-1191 OT Time Calculation (min): 29 min  OT Assessment / Plan / Recommendation  History of present illness Laurie [name] is a 67 y.o. female with Past medical history of hypertension and diabetes mellitus.She presented today with her daughters the complaint of aphasia and left-sided weakness. The symptoms started at around last night by the patient. As per the daughter's they noted facial droop around 10:30 last night. He denies any fall or trauma or headache or chest pain or palpitation. She also denies any dizziness or lightheadedness. There is no incontinence of bowel or bladder. No fever no burning urination no cough no diarrhea. She is not taking any medication at present. She continues to smoke at present half pack a day. She mentions that her symptoms as primarily remain about the same ever since they started. She denies any similar symptoms in the past.      OT comments  Pt progressing towards goals. Pt tearful upon arrival. She performed toileting and bathing tasks as well as grooming. OT assisted in helping her use LUE. Pt is a great rehab candidate.   Follow Up Recommendations  CIR;Supervision/Assistance - 24 hour    Barriers to Discharge       Equipment Recommendations  3 in 1 bedside comode;Tub/shower bench    Recommendations for Other Services Rehab consult  Frequency Min 2X/week   Progress towards OT Goals Progress towards OT goals: Progressing toward goals  Plan Discharge plan remains appropriate    Precautions / Restrictions Precautions Precautions: Fall Restrictions Weight Bearing Restrictions: No   Pertinent Vitals/Pain No pain reported.     ADL  Eating/Feeding: Minimal assistance Where Assessed - Eating/Feeding: Chair Grooming: Wash/dry face;Teeth care;Minimal assistance Where Assessed - Grooming: Supported standing;Supported  sitting Upper Body Bathing: Minimal assistance Where Assessed - Upper Body Bathing: Supported sitting Lower Body Bathing: Minimal assistance Where Assessed - Lower Body Bathing: Supported sit to stand Upper Body Dressing: Minimal assistance Where Assessed - Upper Body Dressing: Supported sitting Lower Body Dressing: Minimal assistance (socks) Where Assessed - Lower Body Dressing: Supported sitting Toilet Transfer: Min Pension scheme manager Method: Sit to Barista: Raised toilet seat with arms (or 3-in-1 over toilet) Toileting - Clothing Manipulation and Hygiene: Min guard Where Assessed - Toileting Clothing Manipulation and Hygiene: Standing;Sit on 3-in-1 or toilet (clothing-standing and hygiene-sitting) Equipment Used: Gait belt Transfers/Ambulation Related to ADLs: Min A for ambulation; Min guard for transfers. ADL Comments: OT assisted pt with using Left hand during ADL tasks today. Encouraged her to use LUE and "quiet" her shoulder. Reviewed retrograde massage technique and told her to prop left arm on pillow when sitting in chair and in bed. Pt tearful upon OT entering room today about the face of her having a stroke.  Assisted pt in using left hand when preparing lunch and educated how she can use her left hand to support.  Educated on dressing technique.     OT Diagnosis:    OT Problem List:   OT Treatment Interventions:     OT Goals(current goals can now be found in the care plan section) Acute Rehab OT Goals Patient Stated Goal: drive her [place] OT Goal Formulation: With patient Time For Goal Achievement: 11/01/13 Potential to Achieve Goals: Good ADL Goals Pt Will Perform Eating: with set-up;sitting Pt Will Perform Grooming: with set-up;with supervision;standing Pt Will Perform Upper Body Bathing: with set-up;with supervision;sitting Pt Will Perform  Lower Body Bathing: with supervision;with set-up;sit to/from stand Pt Will Perform Upper Body  Dressing: with set-up;with supervision;sitting Pt Will Perform Lower Body Dressing: with set-up;with supervision;sit to/from stand Pt Will Transfer to Toilet: with modified independence (sit <> stand; 3 in 1 over commode) Pt Will Perform Toileting - Clothing Manipulation and hygiene: with modified independence;sit to/from stand Pt Will Perform Tub/Shower Transfer: Tub transfer;with supervision;with set-up;ambulating;tub bench Additional ADL Goal #1: Pt will be independent with HEP of LUE to increase strength.  Additional ADL Goal #2: Pt will be independent with edema management techniques for Lt hand.  Visit Information  Last OT Received On: 10/26/13 Assistance Needed: +1 History of Present Illness: Laurie [name] is a 67 y.o. female with Past medical history of hypertension and diabetes mellitus.She presented today with her daughters the complaint of aphasia and left-sided weakness. The symptoms started at around last night by the patient. As per the daughter's they noted facial droop around 10:30 last night. He denies any fall or trauma or headache or chest pain or palpitation. She also denies any dizziness or lightheadedness. There is no incontinence of bowel or bladder. No fever no burning urination no cough no diarrhea. She is not taking any medication at present. She continues to smoke at present half pack a day. She mentions that her symptoms as primarily remain about the same ever since they started. She denies any similar symptoms in the past.       Subjective Data      Prior Functioning       Cognition  Cognition Arousal/Alertness: Awake/alert Behavior During Therapy: WFL for tasks assessed/performed Overall Cognitive Status: Within Functional Limits for tasks assessed    Mobility  Bed Mobility Bed Mobility: Supine to Sit Supine to Sit: 5: Supervision Transfers Transfers: Sit to Stand;Stand to Sit Sit to Stand: 4: Min guard;From bed;From chair/3-in-1 Stand to Sit: 4: Min  guard;To chair/3-in-1 Details for Transfer Assistance: Min guard for safety. Cues for hand placement.    Exercises      Balance     End of Session OT - End of Session Equipment Utilized During Treatment: Gait belt Activity Tolerance: Patient tolerated treatment well Patient left: in chair;with call bell/phone within reach;with nursing/sitter in room Nurse Communication: Other (comment) (IV came out)  Lorri Frederick OTR/L [phone] 10/26/2013, 1:22 PM

## 2013-10-26 NOTE — Progress Notes (Signed)
Rehab admissions - Evaluated for possible admission.  I spoke with patient.  She would like inpatient rehab admission.  I can potentially admit later today if okay with attending MD.  Call me for questions.  #[phone]

## 2013-10-26 NOTE — Discharge Summary (Addendum)
Physician Discharge Summary  Laurie [name] ZOX:[id number] DOB: Nov 30, 1946 DOA: 10/24/2013  PCP: No primary provider on file.  Admit date: 10/24/2013 Discharge date: 10/26/2013  Recommendations for Outpatient Follow-up:  1. To inpatient rehabilitation 2. BMP in 1 week to check potassium and creatinine after starting HCTZ and lisinopril 3. BP check by PCP 1-2 weeks post discharge 4. Titration of metformin as outpatient.  Needs podiatry, ophthalmology, and routine diabetes health maintenance, including proteinuria screen  Discharge Diagnoses:  Principal Problem:   CVA (cerebral infarction) Active Problems:   DIABETES MELLITUS, UNCONTROLLED   ANXIETY   HYPERTENSION, UNCONTROLLED   Arthralgia of hip   Chronic diastolic heart failure, grade 2   Discharge Condition: stable, improved  Diet recommendation: diabetic  Wt Readings from Last 3 Encounters:  10/25/13 80.1 kg (176 lb 9.4 oz)  08/30/09 82.963 kg (182 lb 14.4 oz)  05/24/09 80.65 kg (177 lb 12.8 oz)    History of present illness:   Laurie [name] is a 67 y.o. female with Past medical history of hypertension and diabetes mellitus.  She presented today with her daughters the complaint of aphasia and left-sided weakness. The symptoms started at around last night by the patient. As per the daughter's they noted facial droop around 10:30 last night. He denies any fall or trauma or headache or chest pain or palpitation. She also denies any dizziness or lightheadedness. There is no incontinence of bowel or bladder. No fever no burning urination no cough no diarrhea. She is not taking any medication at present. She continues to smoke at present half pack a day. She mentions that her symptoms as primarily remain about the same ever since they started. She denies any similar symptoms in the past.    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  ECHO demonstrated mild LVH and grade 2 chronic diastolic heart failure, mild MR and trivial AR with some left atrial dilation.  Carotid duplex demonstrated < 39% stenosis bilaterally and antegrade vertebral artery flow.  Telemetry demonstrated NSR with occasional PVCs.  Her BP and diabetes were uncontrolled. -  Continue ASA 325mg  daily -  Start atorvastatin -  Control blood pressure and diabetes as below -  PT and OT recommended CIR  Diabetes mellitus, A1c 7.5.  CBG well controlled on low dose SSI during hospitalization -  Recommend metformin 500mg  BID to titrate to maximum dose slowly over next several weeks  Hip arthralgia due to arthritis.   - Encouraged outpatient ortho evaluation  - Continue prn tramadol  - Started naproxyn prn   HTN, uncontrolled with SBP 150s-180s systolic.  Added lisinopril and HCTZ.  Recommend slow titration of each and the addition of beta blocker (or norvasc given basal HR in the 60s) if blood pressure not trending down with ACEI/diuretic combination.    Dyslipidemia with intracranial atherosclerosis.  Added atorvastatin  Grade 2 diastolic heart failure, likely secondary to uncontrolled HTN and diabetes. -  Heart failure education -  Nutrition consultation about low salt diet  Consultants:  Neurology Physical Medicine and Rehabilitation Procedures:  CT head MRI/MRA brain Carotid duplex ECHO Antibiotics:  none    Discharge Exam: Filed Vitals:   10/26/13 1034  BP: 179/76  Pulse: 63  Temp: 98.4 F (36.9 C)  Resp: 20   Filed Vitals:   10/25/13 2200 10/26/13 0130 10/26/13 0500 10/26/13 1034  BP: 151/63  176/78 150/57 179/76  Pulse: 73 64 62 63  Temp: 98.5 F (36.9 C) 98.4 F (36.9 C) 98.5 F (36.9 C) 98.4 F (36.9 C)  TempSrc: Oral Oral Oral Oral  Resp: 20 18 20 20   Height:      Weight:      SpO2: 99% 98% 98% 96%   States she is scared and sad about her stroke.  Continues to have weakness of  the left extremities and difficulty speaking.    General: AAF, no acute distress  HEENT: NCAT, MMM  Cardiovascular: RRR, nl S1, S2 no mrg, 2+ pulses, warm extremities  Respiratory: CTAB, no increased WOB  Abdomen: NABS, soft, NT/ND  MSK: Normal tone and bulk, no LEE.  Left toe with minimal swelling, no obvious erythema, induration, or warmth Neuro: Obvious left facial droop, 4/5 left upper and Lower extremity strength compared to 5/5 right side. Sensation decreased equally Bilateral feet and ankles. Dysmetria due to weakness left side   Discharge Instructions      Discharge Orders   Future Orders Complete By Expires   (HEART FAILURE PATIENTS) Call MD:  Anytime you have any of the following symptoms: 1) 3 pound weight gain in 24 hours or 5 pounds in 1 week 2) shortness of breath, with or without a dry hacking cough 3) swelling in the hands, feet or stomach 4) if you have to sleep on extra pillows at night in order to breathe.  As directed    Call MD for:  difficulty breathing, headache or visual disturbances  As directed    Call MD for:  extreme fatigue  As directed    Call MD for:  hives  As directed    Call MD for:  persistant dizziness or light-headedness  As directed    Call MD for:  persistant nausea and vomiting  As directed    Call MD for:  severe uncontrolled pain  As directed    Call MD for:  temperature >100.4  As directed    Diet Carb Modified  As directed    Increase activity slowly  As directed        Medication List         aspirin 325 MG tablet  Take 1 tablet (325 mg total) by mouth daily.     atorvastatin 10 MG tablet  Commonly known as:  LIPITOR  Take 1 tablet (10 mg total) by mouth daily at 6 PM.     co-enzyme Q-10 30 MG capsule  Take 100 mg by mouth 2 (two) times daily.     ibuprofen 100 MG tablet  Commonly known as:  ADVIL,MOTRIN  Take 400 mg by mouth every 6 (six) hours as needed for pain (pain in hip).     lisinopril-hydrochlorothiazide 10-12.5 MG  per tablet  Commonly known as:  PRINZIDE  Take 1 tablet by mouth daily.     metFORMIN 500 MG tablet  Commonly known as:  GLUCOPHAGE  Take 1 tablet (500 mg total) by mouth 2 (two) times daily with a meal.     mupirocin ointment 2 %  Commonly known as:  BACTROBAN  Apply topically 2 (two) times daily.     traMADol 50 MG tablet  Commonly known as:  ULTRAM  Take 1 tablet (50 mg total) by mouth every 6 (six) hours as needed for moderate pain.       Follow-up Information   Follow up with Gates Rigg, MD. Schedule an appointment as soon as possible for a  visit in 1 month.   Specialties:  Neurology, Radiology   Contact information:   [address] [place] [address] [phone]       The results of significant diagnostics from this hospitalization (including imaging, microbiology, ancillary and laboratory) are listed below for reference.    Significant Diagnostic Studies: Ct Head (brain) Wo Contrast  10/24/2013   CLINICAL DATA:  Slurred speech. Facial drooping.  EXAM: CT HEAD WITHOUT CONTRAST  TECHNIQUE: Contiguous axial images were obtained from the base of the skull through the vertex without intravenous contrast.  COMPARISON:  Head CT 09/02/2011.  FINDINGS: Patchy and confluent areas of decreased attenuation are noted throughout the deep and periventricular white matter of the cerebral hemispheres bilaterally, compatible with chronic microvascular ischemic disease. No acute intracranial abnormalities. Specifically, no evidence of acute intracranial hemorrhage, no definite findings of acute/subacute cerebral ischemia, no mass, mass effect, hydrocephalus or abnormal intra or extra-axial fluid collections. Visualized paranasal sinuses and mastoids are well pneumatized, with exception a small polypoid lesion in the posterior aspect of the right maxillary sinus. No acute displaced skull fractures are identified.  IMPRESSION: 1. No acute intracranial abnormalities. 2.  Mild chronic microvascular ischemic changes in the cerebral white matter, as above. 3. Small polypoid lesion in the posterior aspect of the right maxillary sinus may represent a mucosal retention cyst or nasal polyp.   Electronically Signed   By: Trudie Reed M.D.   On: 10/24/2013 23:21   Mri Brain Without Contrast  10/25/2013   CLINICAL DATA:  TIA. Acute onset of slurred speech and left-sided weakness at 10:30 p.m. 10/23/2013.  EXAM: MRI HEAD WITHOUT CONTRAST  MRA HEAD WITHOUT CONTRAST  TECHNIQUE: Multiplanar, multiecho pulse sequences of the brain and surrounding structures were obtained without intravenous contrast. Angiographic images of the head were obtained using MRA technique without contrast.  COMPARISON:  CT head without contrast 10/24/2013  FINDINGS: MRI HEAD FINDINGS  An acute nonhemorrhagic infarct in the anterior right pons measures 9 mm maximally. Associated T2 hyperintensities are evident.  Additional nonacute white matter changes are present within the pons bilaterally. A remote lacunar infarct is noted within the posterior right lentiform nucleus. Atrophy and scattered white matter changes bilaterally are advanced for age. The ventricles are proportionate to the degree of atrophy. Flow is present in the major intracranial arteries.  Two punctate areas of remote hemorrhage are present in the anterior right frontal lobe white matter. No acute hemorrhage or mass lesion is present.  The globes and orbits are intact. Mild mucosal thickening is present in the maxillary sinuses bilaterally, right greater than left. There is fluid in the mastoid air cells bilaterally, left greater than right. No obstructing nasopharyngeal lesion is evident.  MRA HEAD FINDINGS  Atherosclerotic changes are present within the cavernous carotid arteries bilaterally. Mild to moderate focal stenosis is present in the left cavernous internal carotid artery. No other significant stenoses are present in the internal carotid  arteries. There is mild irregularity and narrowing of the left M1 segment without a significant stenosis. The anterior communicating artery is patent. Local mild narrowing is noted at the proximal left A1 segment. There is moderate segmental narrowing in the A2 branches bilaterally as well as the MCA branches bilaterally.  The left vertebral artery is the dominant vessel. A high-grade stenosis is present at the distal right vertebral artery. There is mild irregularity of the proximal basilar artery with narrowing of approximately 50%. The more distal basilar artery is within normal limits. There  is significant signal loss in the proximal posterior cerebral arteries bilaterally. PCA branch vessel attenuation is greatest on the right.  IMPRESSION: 1. Acute nonhemorrhagic infarct within the anterior right pons. 2. Focal narrowing of the proximal basilar artery measuring up to 50% corresponds to the areas of acute infarction. 3. Diffuse intracranial atherosclerotic changes are evident with the most significant disease in the proximal A2 segments bilaterally and proximal posterior cerebral arteries. 4. Age advanced atrophy and diffuse white matter disease. 5. Remote lacunar infarct of the posterior right basilar ganglia. These results will be called to the ordering clinician or representative by the Radiologist Assistant, and communication documented in the PACS Dashboard.   Electronically Signed   By: Gennette Pac M.D.   On: 10/25/2013 16:55   Mr [name] Head/brain Wo Cm  10/25/2013   CLINICAL DATA:  TIA. Acute onset of slurred speech and left-sided weakness at 10:30 p.m. 10/23/2013.  EXAM: MRI HEAD WITHOUT CONTRAST  MRA HEAD WITHOUT CONTRAST  TECHNIQUE: Multiplanar, multiecho pulse sequences of the brain and surrounding structures were obtained without intravenous contrast. Angiographic images of the head were obtained using MRA technique without contrast.  COMPARISON:  CT head without contrast 10/24/2013  FINDINGS:  MRI HEAD FINDINGS  An acute nonhemorrhagic infarct in the anterior right pons measures 9 mm maximally. Associated T2 hyperintensities are evident.  Additional nonacute white matter changes are present within the pons bilaterally. A remote lacunar infarct is noted within the posterior right lentiform nucleus. Atrophy and scattered white matter changes bilaterally are advanced for age. The ventricles are proportionate to the degree of atrophy. Flow is present in the major intracranial arteries.  Two punctate areas of remote hemorrhage are present in the anterior right frontal lobe white matter. No acute hemorrhage or mass lesion is present.  The globes and orbits are intact. Mild mucosal thickening is present in the maxillary sinuses bilaterally, right greater than left. There is fluid in the mastoid air cells bilaterally, left greater than right. No obstructing nasopharyngeal lesion is evident.  MRA HEAD FINDINGS  Atherosclerotic changes are present within the cavernous carotid arteries bilaterally. Mild to moderate focal stenosis is present in the left cavernous internal carotid artery. No other significant stenoses are present in the internal carotid arteries. There is mild irregularity and narrowing of the left M1 segment without a significant stenosis. The anterior communicating artery is patent. Local mild narrowing is noted at the proximal left A1 segment. There is moderate segmental narrowing in the A2 branches bilaterally as well as the MCA branches bilaterally.  The left vertebral artery is the dominant vessel. A high-grade stenosis is present at the distal right vertebral artery. There is mild irregularity of the proximal basilar artery with narrowing of approximately 50%. The more distal basilar artery is within normal limits. There is significant signal loss in the proximal posterior cerebral arteries bilaterally. PCA branch vessel attenuation is greatest on the right.  IMPRESSION: 1. Acute nonhemorrhagic  infarct within the anterior right pons. 2. Focal narrowing of the proximal basilar artery measuring up to 50% corresponds to the areas of acute infarction. 3. Diffuse intracranial atherosclerotic changes are evident with the most significant disease in the proximal A2 segments bilaterally and proximal posterior cerebral arteries. 4. Age advanced atrophy and diffuse white matter disease. 5. Remote lacunar infarct of the posterior right basilar ganglia. These results will be called to the ordering clinician or representative by the Radiologist Assistant, and communication documented in the PACS Dashboard.   Electronically Signed  By: Gennette Pac M.D.   On: 10/25/2013 16:55    Microbiology: No results found for this or any previous visit (from the past 240 hour(s)).   Labs: Basic Metabolic Panel:  Recent Labs Lab 10/24/13 2300  NA 138  K 3.5  CL 105  CO2 22  GLUCOSE 130*  BUN 13  CREATININE 0.66  CALCIUM 9.1   Liver Function Tests:  Recent Labs Lab 10/24/13 2300  AST 18  ALT 8  ALKPHOS 75  BILITOT 0.2*  PROT 7.0  ALBUMIN 3.7   No results found for this basename: LIPASE, AMYLASE,  in the last 168 hours No results found for this basename: AMMONIA,  in the last 168 hours CBC:  Recent Labs Lab 10/24/13 2300  WBC 8.8  NEUTROABS 3.8  HGB 12.7  HCT 36.3  MCV 92.1  PLT 271   Cardiac Enzymes:  Recent Labs Lab 10/24/13 2300  TROPONINI <0.30   BNP: BNP (last 3 results) No results found for this basename: PROBNP,  in the last 8760 hours CBG:  Recent Labs Lab 10/25/13 1649 10/25/13 2158 10/26/13 0655 10/26/13 0734 10/26/13 1157  GLUCAP 137* 128* 109* 100* 136*   Lab Results  Component Value Date   CHOL 150 10/25/2013   HDL 35* 10/25/2013   LDLCALC 83 10/25/2013   TRIG 158* 10/25/2013   CHOLHDL 4.3 10/25/2013    Time coordinating discharge: 45 minutes  Signed:  Renae Fickle  [facility] 10/26/2013, 1:26 PM

## 2013-10-26 NOTE — Progress Notes (Signed)
Patient ID: KIMERLY ROWAND, female   DOB: 1946-02-23, 67 y.o.   MRN: [id number] Patient admitted to room 4W11.  Arrived to unit via wheelchair, escorted by nursing staff.  Patient appears to be in no immediate distress.  Patient verbalized understanding of rehab process, no questions asked.  Signed fall prevention safety plan.  Alert and oriented, very pleasant.  Expresses no needs at this time. Dani Gobble, RN

## 2013-10-26 NOTE — Progress Notes (Signed)
Physical Therapy Treatment Patient Details Name: Laurie [name] MRN: [id number] DOB: 09/27/1946 Today's Date: 10/26/2013 Time: 7829-5621 PT Time Calculation (min): 22 min  PT Assessment / Plan / Recommendation  History of Present Illness Laurie [name] is a 67 y.o. female with Past medical history of hypertension and diabetes mellitus.She presented today with her daughters the complaint of aphasia and left-sided weakness. The symptoms started at around last night by the patient. As per the daughter's they noted facial droop around 10:30 last night. He denies any fall or trauma or headache or chest pain or palpitation. She also denies any dizziness or lightheadedness. There is no incontinence of bowel or bladder. No fever no burning urination no cough no diarrhea. She is not taking any medication at present. She continues to smoke at present half pack a day. She mentions that her symptoms as primarily remain about the same ever since they started. She denies any similar symptoms in the past.      PT Comments   Pt progressing well with ambulation today with HHA.  Pt emotional throughout tx due to illness; pt motivated to participate in PT.  Pt will benefit from CIR to improve functional independence.  Follow Up Recommendations  CIR     Does the patient have the potential to tolerate intense rehabilitation     Barriers to Discharge        Equipment Recommendations  None recommended by PT    Recommendations for Other Services Rehab consult  Frequency Min 5X/week   Progress towards PT Goals Progress towards PT goals: Progressing toward goals  Plan Current plan remains appropriate    Precautions / Restrictions Precautions Precautions: Fall Restrictions Weight Bearing Restrictions: No   Pertinent Vitals/Pain Denied pain throughout   Mobility  Bed Mobility Bed Mobility: Supine to Sit Supine to Sit: 5: Supervision Details for Bed Mobility Assistance: Pt recieved in  recliner Transfers Transfers: Sit to Stand;Stand to Sit Sit to Stand: 4: Min guard;From chair/3-in-1 Stand to Sit: To bed;4: Min guard Details for Transfer Assistance: Min guard for balance/safety.  Ambulation/Gait Ambulation/Gait Assistance: 4: Min assist Ambulation Distance (Feet): 300 Feet Assistive device: 1 person hand held assist Ambulation/Gait Assistance Details: R HHA for balance and safety as pt was unsteady and not safe to ambulate without assistance Gait Pattern: Narrow base of support;Decreased stride length;Step-to pattern Gait velocity: decreased Stairs: No Wheelchair Mobility Wheelchair Mobility: No    Exercises     PT Diagnosis:    PT Problem List:   PT Treatment Interventions:     PT Goals (current goals can now be found in the care plan section) Acute Rehab PT Goals Patient Stated Goal: drive her [place]  Visit Information  Last PT Received On: 10/26/13 Assistance Needed: +1 History of Present Illness: Laurie [name] is a 67 y.o. female with Past medical history of hypertension and diabetes mellitus.She presented today with her daughters the complaint of aphasia and left-sided weakness. The symptoms started at around last night by the patient. As per the daughter's they noted facial droop around 10:30 last night. He denies any fall or trauma or headache or chest pain or palpitation. She also denies any dizziness or lightheadedness. There is no incontinence of bowel or bladder. No fever no burning urination no cough no diarrhea. She is not taking any medication at present. She continues to smoke at present half pack a day. She mentions that her symptoms as primarily remain about the same ever since they started. She denies  any similar symptoms in the past.       Subjective Data  Patient Stated Goal: drive her [place]   Cognition  Cognition Arousal/Alertness: Awake/alert Behavior During Therapy: WFL for tasks assessed/performed Overall Cognitive Status:  Within Functional Limits for tasks assessed    Balance     End of Session PT - End of Session Equipment Utilized During Treatment: Gait belt Activity Tolerance: Patient tolerated treatment well Patient left: in bed;with call bell/phone within reach;Other (comment) (Sitting EOB per pt request) Nurse Communication: Mobility status (Request for medication due to back itching)   GP     Alyannah Sanks , SPTA  10/26/2013, 3:13 PM

## 2013-10-26 NOTE — H&P (Signed)
Physical Medicine and Rehabilitation Admission H&P  Chief Complaint   Patient presents with   .  Left sided weakness and slurred speech.   HPI: Laurie [name] is a 67 y.o. right-handed female with history of hypertension as well as diabetes mellitus, HTN and tobacco abuse who was admitted 10/24/2013 with left-sided weakness and slurred speech. Cranial CT scan negative. MRI of the brain showed acute nonhemorrhagic infarct anterior right pons as well as remote lacunar infarct posterior right lentiform nucleus. 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 Echocardiogram with ejection fraction of 60% grade 2 diastolic dysfunction. Carotid Dopplers without significant ICA stenosis. Neurology recommends ASA for secondary stroke prevention of thrombotic stroke due to SVD. Patient continues with left sided weakness with LLE instability. Admitted to inpatient rehab today after consultation with the rehab service.   Review of Systems  Musculoskeletal: Positive for back pain and myalgias.  Neurological: Positive for speech change and focal weakness.   Past Medical History   Diagnosis  Date   .  Hypertension    .  Diabetes mellitus    .  Chest pain      Troponins up to 0.12 while hospitalized for E.coli PNA   .  Sacral decubitus ulcer      Stage III, 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   .  Bacteremia    .  Anemia    .  Narcolepsy     History reviewed. No pertinent past surgical history.  Family History   Problem  Relation  Age of Onset   .  CAD  Mother    .  CAD  Father     Social History: Lives with daughter (works days). Disabled due to back injury. Per reports that she has been smoking about 1/2 PPD. She does not have any smokeless tobacco history on file. Per reports that she does not drink alcohol or use illicit drugs.  Allergies   Allergen  Reactions   .  Penicillins     Medications Prior to Admission   Medication  Sig  Dispense  Refill    .  co-enzyme Q-10 30 MG capsule  Take 100 mg by mouth 2 (two) times daily.     Marland Kitchen  ibuprofen (ADVIL,MOTRIN) 100 MG tablet  Take 400 mg by mouth every 6 (six) hours as needed for pain (pain in hip).      Home:  Home Living  Family/patient expects to be discharged to:: Private residence  Living Arrangements: Children  Available Help at Discharge: Family;Available PRN/intermittently  Type of Home: Apartment  Home Access: Stairs to enter  Entrance Stairs-Number of Steps: 2  Home Layout: One level  Functional History:   Functional Status:  Mobility:  Bed Mobility  Bed Mobility: Supine to Sit  Supine to Sit: 5: Supervision  Transfers  Transfers: Sit to Stand;Stand to Sit  Sit to Stand: 4: Min assist;With upper extremity assist;From bed;From toilet;From chair/3-in-1  Stand to Sit: 4: Min assist;With upper extremity assist;To chair/3-in-1;To toilet  Ambulation/Gait  Ambulation/Gait Assistance: 4: Min assist  Ambulation Distance (Feet): 50 Feet  Assistive device: 1 person hand held assist  Ambulation/Gait Assistance Details: Pt with slight L knee buckling but able to correct, requires min A L HHA with assistance needed for balance. Decreased cadence, narrow BOS   ADL:  ADL  Eating/Feeding: Minimal assistance  Where Assessed - Eating/Feeding: Chair  Grooming: Minimal assistance  Where Assessed - Grooming: Supported standing  Upper Body Bathing: Moderate assistance  Where Assessed - Upper Body Bathing: Supported sitting  Lower Body Bathing: Minimal assistance  Where Assessed - Lower Body Bathing: Supported sit to stand  Upper Body Dressing: Moderate assistance  Where Assessed - Upper Body Dressing: Supported sitting  Lower Body Dressing: Moderate assistance  Where Assessed - Lower Body Dressing: Supported sit to Scientist, research (life sciences): Minimal assistance  Toilet Transfer Method: Sit to stand (stand to sit)  Acupuncturist: Comfort height toilet;Grab bars  Tub/Shower  Transfer Method: Not assessed  Equipment Used: Gait belt  Transfers/Ambulation Related to ADLs: Min A  ADL Comments: Educated to be moving fingers throughout day and using right hand to assist as needed. Also, educated and demonstrated retrograde massage on left hand as it had edema. Educated for pt to use left hand during activities and told her to "quiet her shoulder." Assisted pt in grooming at sink as she was trying to use left hand.  Cognition:  Cognition  Overall Cognitive Status: Impaired/Different from baseline  Orientation Level: Oriented X4  Cognition  Arousal/Alertness: Awake/alert  Behavior During Therapy: WFL for tasks assessed/performed  Overall Cognitive Status: Impaired/Different from baseline  Area of Impairment: Attention  Current Attention Level: Sustained  General Comments: pt very talkative requiring cues to remain on task    Physical Exam:  Blood pressure 179/76, pulse 63, temperature 98.4 F (36.9 C), temperature source Oral, resp. rate 20, height 5\' 8"  (1.727 m), weight 80.1 kg (176 lb 9.4 oz), SpO2 96.00%.  Constitutional: She is oriented to person, place, and time. She appears well-developed.  HENT:  Head: Normocephalic and atraumatic.  Right Ear: External ear normal.  Left Ear: External ear normal.  Left facial droop  Eyes: Conjunctivae and EOM are normal. Pupils are equal, round, and reactive to light.  Pupils round and reactive to light  Neck: Normal range of motion. Neck supple. No JVD present. No tracheal deviation present. No thyromegaly present.  Cardiovascular: Normal rate, regular rhythm and normal heart sounds.  Respiratory: Effort normal and breath sounds normal. No respiratory distress. She has no wheezes. She has no rales.  GI: Soft. Bowel sounds are normal. She exhibits no distension.  Musculoskeletal: She exhibits edema (mild edema left hand/wrist).  Lymphadenopathy:  She has no cervical adenopathy.  Neurological: She is alert and oriented  to person, place, and time. She displays abnormal reflex. A cranial nerve deficit is present. She exhibits normal muscle tone. Coordination abnormal.  Speech is dysarthric but intelligible. She makes good eye contact and follows commands. Left central 7 and tongue deviation. Fair to good sitting balance EOB. LUE 1/5 deltoid, bicep, tricep. Trace wrist and HI. LLE is 2+ HF and KE,KF. 2/5 ADF and APF. Senses pain and light touch. Cognitively displays good insight and awareness.  Skin: Skin is warm and dry.  Psychiatric: She has a normal mood and affect. Her behavior is normal. Thought content normal.    Results for orders placed during the hospital encounter of 10/24/13 (from the past 48 hour(s))   HEMOGLOBIN A1C Status: Abnormal    Collection Time    10/25/13 6:35 AM   Result  Value  Range    Hemoglobin A1C  7.5 (*)  <5.7 %    Comment:  (NOTE)         According to the ADA Clinical Practice Recommendations for 2011, when     HbA1c is used as a screening test:     >=6.5% Diagnostic of Diabetes Mellitus     (  if abnormal result is confirmed)     5.7-6.4% Increased risk of developing Diabetes Mellitus     References:Diagnosis and Classification of Diabetes Mellitus,Diabetes     Care,2011,34(Suppl 1):S62-S69 and Standards of Medical Care in     Diabetes - 2011,Diabetes Care,2011,34 (Suppl 1):S11-S61.    Mean Plasma Glucose  169 (*)  <117 mg/dL    Comment:  Performed at [facility]   LIPID PANEL Status: Abnormal    Collection Time    10/25/13 6:35 AM   Result  Value  Range    Cholesterol  150  0 - 200 mg/dL    Triglycerides  161 (*)  <150 mg/dL    HDL  35 (*)  >09 mg/dL    Total CHOL/HDL Ratio  4.3     VLDL  32  0 - 40 mg/dL    LDL Cholesterol  83  0 - 99 mg/dL    Comment:      Total Cholesterol/HDL:CHD Risk     Coronary Heart Disease Risk Table     Men Women     1/2 Average Risk 3.4 3.3     Average Risk 5.0 4.4     2 X Average Risk 9.6 7.1     3 X Average Risk 23.4 11.0          Use the calculated Patient Ratio     above and the CHD Risk Table     to determine the patient's CHD Risk.         ATP III CLASSIFICATION (LDL):     <100 mg/dL Optimal     604-540 mg/dL Near or Above     Optimal     130-159 mg/dL Borderline     981-191 mg/dL High     >478 mg/dL Very High   GLUCOSE, CAPILLARY Status: Abnormal    Collection Time    10/25/13 7:08 AM   Result  Value  Range    Glucose-Capillary  117 (*)  70 - 99 mg/dL   GLUCOSE, CAPILLARY Status: Abnormal    Collection Time    10/25/13 11:14 AM   Result  Value  Range    Glucose-Capillary  135 (*)  70 - 99 mg/dL    Comment 1  Notify RN    GLUCOSE, CAPILLARY Status: Abnormal    Collection Time    10/25/13 4:49 PM   Result  Value  Range    Glucose-Capillary  137 (*)  70 - 99 mg/dL   GLUCOSE, CAPILLARY Status: Abnormal    Collection Time    10/25/13 9:58 PM   Result  Value  Range    Glucose-Capillary  128 (*)  70 - 99 mg/dL   GLUCOSE, CAPILLARY Status: Abnormal    Collection Time    10/26/13 6:55 AM   Result  Value  Range    Glucose-Capillary  109 (*)  70 - 99 mg/dL   GLUCOSE, CAPILLARY Status: Abnormal    Collection Time    10/26/13 7:34 AM   Result  Value  Range    Glucose-Capillary  100 (*)  70 - 99 mg/dL   Ct Head (brain) Wo Contrast  10/24/2013 CLINICAL DATA: Slurred speech. Facial drooping. EXAM: CT HEAD WITHOUT CONTRAST TECHNIQUE: Contiguous axial images were obtained from the base of the skull through the vertex without intravenous contrast. COMPARISON: Head CT 09/02/2011. FINDINGS: Patchy and confluent areas of decreased attenuation are noted throughout the deep and periventricular white matter of the cerebral hemispheres bilaterally, compatible  with chronic microvascular ischemic disease. No acute intracranial abnormalities. Specifically, no evidence of acute intracranial hemorrhage, no definite findings of acute/subacute cerebral ischemia, no mass, mass effect, hydrocephalus or abnormal intra or  extra-axial fluid collections. Visualized paranasal sinuses and mastoids are well pneumatized, with exception a small polypoid lesion in the posterior aspect of the right maxillary sinus. No acute displaced skull fractures are identified. IMPRESSION: 1. No acute intracranial abnormalities. 2. Mild chronic microvascular ischemic changes in the cerebral white matter, as above. 3. Small polypoid lesion in the posterior aspect of the right maxillary sinus may represent a mucosal retention cyst or nasal polyp. Electronically Signed By: Trudie Reed M.D. On: 10/24/2013 23:21  Mri Brain Without Contrast  10/25/2013 CLINICAL DATA: TIA. Acute onset of slurred speech and left-sided weakness at 10:30 p.m. 10/23/2013. EXAM: MRI HEAD WITHOUT CONTRAST MRA HEAD WITHOUT CONTRAST TECHNIQUE: Multiplanar, multiecho pulse sequences of the brain and surrounding structures were obtained without intravenous contrast. Angiographic images of the head were obtained using MRA technique without contrast. COMPARISON: CT head without contrast 10/24/2013 FINDINGS: MRI HEAD FINDINGS An acute nonhemorrhagic infarct in the anterior right pons measures 9 mm maximally. Associated T2 hyperintensities are evident. Additional nonacute white matter changes are present within the pons bilaterally. A remote lacunar infarct is noted within the posterior right lentiform nucleus. Atrophy and scattered white matter changes bilaterally are advanced for age. The ventricles are proportionate to the degree of atrophy. Flow is present in the major intracranial arteries. Two punctate areas of remote hemorrhage are present in the anterior right frontal lobe white matter. No acute hemorrhage or mass lesion is present. The globes and orbits are intact. Mild mucosal thickening is present in the maxillary sinuses bilaterally, right greater than left. There is fluid in the mastoid air cells bilaterally, left greater than right. No obstructing nasopharyngeal lesion is  evident. MRA HEAD FINDINGS Atherosclerotic changes are present within the cavernous carotid arteries bilaterally. Mild to moderate focal stenosis is present in the left cavernous internal carotid artery. No other significant stenoses are present in the internal carotid arteries. There is mild irregularity and narrowing of the left M1 segment without a significant stenosis. The anterior communicating artery is patent. Local mild narrowing is noted at the proximal left A1 segment. There is moderate segmental narrowing in the A2 branches bilaterally as well as the MCA branches bilaterally. The left vertebral artery is the dominant vessel. A high-grade stenosis is present at the distal right vertebral artery. There is mild irregularity of the proximal basilar artery with narrowing of approximately 50%. The more distal basilar artery is within normal limits. There is significant signal loss in the proximal posterior cerebral arteries bilaterally. PCA branch vessel attenuation is greatest on the right. IMPRESSION: 1. Acute nonhemorrhagic infarct within the anterior right pons. 2. Focal narrowing of the proximal basilar artery measuring up to 50% corresponds to the areas of acute infarction. 3. Diffuse intracranial atherosclerotic changes are evident with the most significant disease in the proximal A2 segments bilaterally and proximal posterior cerebral arteries. 4. Age advanced atrophy and diffuse white matter disease. 5. Remote lacunar infarct of the posterior right basilar ganglia. These results will be called to the ordering clinician or representative by the Radiologist Assistant, and communication documented in the PACS Dashboard. Electronically Signed By: Gennette Pac M.D. On: 10/25/2013 16:55    Post Admission Physician Evaluation:  1. 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 2. Patient is admitted to receive collaborative, interdisciplinary care between the physiatrist, rehab  nursing staff, and therapy team. 3. Patient's level of medical complexity and substantial therapy needs in context of that medical necessity cannot be provided at a lesser intensity of care such as a SNF. 4. Patient has experienced substantial functional loss from his/her baseline which was documented above under the "Functional History" and "Functional Status" headings. Judging by the patient's diagnosis, physical exam, and functional history, the patient has potential for functional progress which will result in measurable gains while on inpatient rehab. These gains will be of substantial and practical use upon discharge in facilitating mobility and self-care at the household level. 5. Physiatrist will provide 24 hour management of medical needs as well as oversight of the therapy plan/treatment and provide guidance as appropriate regarding the interaction of the two. 6. 24 hour rehab nursing will assist with bladder management, bowel management, safety, skin/wound care, disease management, medication administration, pain management and patient education and help integrate therapy concepts, techniques,education, etc. 7. PT will assess and treat for/with: Lower extremity strength, range of motion, stamina, balance, functional mobility, safety, adaptive techniques and equipment, NMR, education. Goals are: mod I to supervision. 8. OT will assess and treat for/with: ADL's, functional mobility, safety, upper extremity strength, adaptive techniques and equipment, NMR, education. Goals are: mod I to min assist. 9. SLP will assess and treat for/with: speech, language, swallowing. Goals are: mod I to supervision. 10. Case Management and Social Worker will assess and treat for psychological issues and discharge planning. 11. Team conference will be held weekly to assess progress toward goals and to determine barriers to discharge. 12. Patient will receive at least 3 hours of therapy per day at least 5 days per  week. 13. ELOS: 15-20 days  14. Prognosis: excellent   Medical Problem List and Plan:  1. DVT Prophylaxis/Anticoagulation: Pharmaceutical: Lovenox  2. Pain Management: n/a  3. Mood: Motivated to get better. Will have LCSW follow for evaluation.  4. Neuropsych: This patient is capable of making decisions on her own behalf.  5 Diabetes: Will monitor with ac/hs checks--use SSI for tighter control. Continue metformin bid.  6. HTN: Will monitor with bid checks--avoid hypotension. Was started on Prinivil and HCTZ--continue and recheck lytes in am.   Ranelle Oyster, MD, [facility] Physical Medicine & Rehabilitation   10/26/2013

## 2013-10-26 NOTE — Progress Notes (Signed)
Stroke Team Progress Note  HISTORY Laurie [name] is an 67 y.o. female Mr. diabetes mellitus and hypertension who developed acute onset of slurred speech and left-sided weakness at 10:30 PM on 10/23/2013. There is no previous history of stroke nor TIA. Patient has not been on antiplatelet therapy. CT scan of her head showed no signs of acute intracranial abnormality. NIH stroke score was 4.   LSN: 10:30 PM on 10/23/2013  tPA Given: No: Beyond time window for treatment consideration  MRankin: 1   She was admitted for further evaluation and treatment.   SUBJECTIVE Overall she feels her condition is gradually improving. No new neurological symptoms. Anticipating transfer to rehab today.     OBJECTIVE Most recent Vital Signs: Filed Vitals:   10/25/13 2200 10/26/13 0130 10/26/13 0500 10/26/13 1034  BP: 151/63 176/78 150/57 179/76  Pulse: 73 64 62 63  Temp: 98.5 F (36.9 C) 98.4 F (36.9 C) 98.5 F (36.9 C) 98.4 F (36.9 C)  TempSrc: Oral Oral Oral Oral  Resp: 20 18 20 20   Height:      Weight:      SpO2: 99% 98% 98% 96%   CBG (last 3)   Recent Labs  10/26/13 0655 10/26/13 0734 10/26/13 1157  GLUCAP 109* 100* 136*    IV Fluid Intake:     MEDICATIONS  . aspirin  325 mg Oral Daily  . atorvastatin  10 mg Oral q1800  . enoxaparin (LOVENOX) injection  40 mg Subcutaneous Q24H  . hydrochlorothiazide  12.5 mg Oral Daily  . lisinopril  5 mg Oral Daily  . metFORMIN  500 mg Oral BID WC   PRN:  naproxen, traMADol  Diet:  Carb Control thin liquids Activity:  Bathroom privileges with assistance DVT Prophylaxis:  lovenox  CLINICALLY SIGNIFICANT STUDIES Basic Metabolic Panel:   Recent Labs Lab 10/24/13 2300  NA 138  K 3.5  CL 105  CO2 22  GLUCOSE 130*  BUN 13  CREATININE 0.66  CALCIUM 9.1   Liver Function Tests:   Recent Labs Lab 10/24/13 2300  AST 18  ALT 8  ALKPHOS 75  BILITOT 0.2*  PROT 7.0  ALBUMIN 3.7   CBC:   Recent Labs Lab 10/24/13 2300  WBC  8.8  NEUTROABS 3.8  HGB 12.7  HCT 36.3  MCV 92.1  PLT 271   Coagulation:   Recent Labs Lab 10/24/13 2300  LABPROT 12.9  INR 0.99   Cardiac Enzymes:   Recent Labs Lab 10/24/13 2300  TROPONINI <0.30   Urinalysis: No results found for this basename: COLORURINE, APPERANCEUR, LABSPEC, PHURINE, GLUCOSEU, HGBUR, BILIRUBINUR, KETONESUR, PROTEINUR, UROBILINOGEN, NITRITE, LEUKOCYTESUR,  in the last 168 hours Lipid Panel    Component Value Date/Time   CHOL 150 10/25/2013 0635   TRIG 158* 10/25/2013 0635   HDL 35* 10/25/2013 0635   CHOLHDL 4.3 10/25/2013 0635   VLDL 32 10/25/2013 0635   LDLCALC 83 10/25/2013 0635   HgbA1C  Lab Results  Component Value Date   HGBA1C 7.5* 10/25/2013    Urine Drug Screen:   No results found for this basename: labopia,  cocainscrnur,  labbenz,  amphetmu,  thcu,  labbarb    Alcohol Level: No results found for this basename: ETH,  in the last 168 hours  Ct Head (brain) Wo Contrast 10/24/2013  1. No acute intracranial abnormalities. 2. Mild chronic microvascular ischemic changes in the cerebral white matter, as above. 3. Small polypoid lesion in the posterior aspect of the right maxillary sinus may  represent a mucosal retention cyst or nasal polyp.     MRI/MRA of the brain   1. Acute nonhemorrhagic infarct within the anterior right pons.  2. Focal narrowing of the proximal basilar artery measuring up to  50% corresponds to the areas of acute infarction.  3. Diffuse intracranial atherosclerotic changes are evident with the  most significant disease in the proximal A2 segments bilaterally and  proximal posterior cerebral arteries.  4. Age advanced atrophy and diffuse white matter disease.  5. Remote lacunar infarct of the posterior right basilar ganglia     2D Echocardiogram  EF 60%, wall motion normal, LA mildly dilated.  Carotid Doppler  Bilateral carotid artery duplex: 1-39% ICA stenosis. Vertebral artery flow is antegrade  CXR    EKG  normal  sinus rhythm.   Therapy Recommendations CIR  Physical Exam   Mental Status:  Alert, oriented, thought content appropriate. Speech fluent without evidence of aphasia. Able to follow commands without difficulty.  Cranial Nerves:  II-Visual fields were normal.  III/IV/VI-left pupil reacted normally to light and was of normal size; right corneal is opacified. Extraocular movements were full and conjugate.  V/VII-no facial numbness; mode left lower facial weakness.  VIII-normal.  X-mild dysarthria.  Motor: Moderate weakness proximally in moderate weakness distally of left upper and lower extremities; normal strength on the right; normal muscle tone throughout.  Sensory: Reduced vibratory sensation distally in both lower extremities.  Deep Tendon Reflexes: 2+ and symmetric.  Plantars: Flexor bilaterally  Cerebellar: Normal finger-to-nose testing.  Carotid auscultation: Normal   ASSESSMENT Laurie [name] is a 67 y.o. female presenting with slurred speech, left hemiparesis. CT Imaging does not show new infarct. MR imaging pending. Cause felt to be secondary to small vessel disease.  On no antithrombotics prior to admission. Now on aspirin 325 mg orally every day for secondary stroke prevention. Patient with resultant left hemiparesis.    Hypertension  Diabetes mellitus, type 2, controlled,  HGB A1C  7.1  LDL, 83, goal < 70 in diabetics, start statin. Aggressive diet control: low fat, carb controlled.   Hospital day # 2  TREATMENT/PLAN  Continue  aspirin 325 mg orally every day for secondary stroke prevention.  CIR today. May transfer from neurological standpoint.  Have patient follow up with Dr. Pearlean Brownie in 2 months in stroke clinic.  Laurie [name]. Manson Passey, [facility], MBA, MHA [facility] Pager: [phone] 10/26/2013 12:41 PM  I have personally obtained a history, examined the patient, evaluated imaging results, and formulated the assessment and plan of care. I agree with the  above.  Delia Heady, MD

## 2013-10-27 ENCOUNTER — Inpatient Hospital Stay (HOSPITAL_COMMUNITY): Payer: Medicare Other

## 2013-10-27 ENCOUNTER — Inpatient Hospital Stay (HOSPITAL_COMMUNITY): Payer: Medicare Other | Admitting: *Deleted

## 2013-10-27 ENCOUNTER — Inpatient Hospital Stay (HOSPITAL_COMMUNITY): Payer: Medicare Other | Admitting: Speech Pathology

## 2013-10-27 LAB — GLUCOSE, CAPILLARY
Glucose-Capillary: 130 mg/dL — ABNORMAL HIGH (ref 70–99)
Glucose-Capillary: 138 mg/dL — ABNORMAL HIGH (ref 70–99)
Glucose-Capillary: 131 mg/dL — ABNORMAL HIGH (ref 70–99)

## 2013-10-27 LAB — CBC WITH DIFFERENTIAL/PLATELET
Basophils Relative: 0 % (ref 0–1)
Eosinophils Absolute: 0.2 10*3/uL (ref 0.0–0.7)
HCT: 35.8 % — ABNORMAL LOW (ref 36.0–46.0)
Hemoglobin: 12.5 g/dL (ref 12.0–15.0)
Lymphs Abs: 3.1 10*3/uL (ref 0.7–4.0)
MCH: 32.1 pg (ref 26.0–34.0)
MCHC: 34.9 g/dL (ref 30.0–36.0)
Monocytes Absolute: 0.6 10*3/uL (ref 0.1–1.0)
Monocytes Relative: 8 % (ref 3–12)
RBC: 3.89 MIL/uL (ref 3.87–5.11)

## 2013-10-27 LAB — COMPREHENSIVE METABOLIC PANEL
AST: 10 U/L (ref 0–37)
Albumin: 3.2 g/dL — ABNORMAL LOW (ref 3.5–5.2)
Alkaline Phosphatase: 67 U/L (ref 39–117)
BUN: 16 mg/dL (ref 6–23)
CO2: 24 mEq/L (ref 19–32)
Chloride: 107 mEq/L (ref 96–112)
Creatinine, Ser: 0.74 mg/dL (ref 0.50–1.10)
GFR calc Af Amer: >90 mL/min (ref >90)
GFR calc non Af Amer: 88 mL/min — ABNORMAL LOW (ref >90)
Glucose, Bld: 117 mg/dL — ABNORMAL HIGH (ref 70–99)
Sodium: 142 mEq/L (ref 135–145)
Total Bilirubin: 0.3 mg/dL (ref 0.3–1.2)

## 2013-10-27 MED ORDER — PNEUMOCOCCAL VAC POLYVALENT 25 MCG/0.5ML IJ INJ
0.5000 mL | INJECTION | INTRAMUSCULAR | Status: AC
  Administered 2013-10-28: 0.5 mL via INTRAMUSCULAR
  Filled 2013-10-27: qty 0.5

## 2013-10-27 MED ORDER — POTASSIUM CHLORIDE CRYS ER 20 MEQ PO TBCR
20.0000 meq | EXTENDED_RELEASE_TABLET | Freq: Every day | ORAL | Status: AC
  Administered 2013-10-29 – 2013-11-01 (×4): 20 meq via ORAL
  Filled 2013-10-27 (×4): qty 1

## 2013-10-27 MED ORDER — POTASSIUM CHLORIDE CRYS ER 20 MEQ PO TBCR
20.0000 meq | EXTENDED_RELEASE_TABLET | Freq: Three times a day (TID) | ORAL | Status: AC
  Administered 2013-10-27 – 2013-10-28 (×3): 20 meq via ORAL
  Filled 2013-10-27 (×4): qty 1

## 2013-10-27 NOTE — Progress Notes (Signed)
Occupational Therapy Assessment and Plan  Patient Details  Name: Laurie [name] MRN: [id number] Date of Birth: [birth year]  OT Diagnosis: hemiplegia affecting non-dominant side and muscle weakness (generalized) Rehab Potential:  excellent ELOS:   7-10 days  Today's Date: 10/27/2013 Time: 0730-0830 Time Calculation (min): 60 min  Problem List:  Patient Active Problem List   Diagnosis Date Noted  . Chronic diastolic heart failure, grade 2 10/26/2013  . Dyslipidemia 10/26/2013  . CVA (cerebral infarction) 10/25/2013  . Arthralgia of hip 10/25/2013  . ANEMIA, NORMOCYTIC 05/17/2009  . DIABETES MELLITUS, UNCONTROLLED 05/08/2009  . ANXIETY 05/08/2009  . HYPERTENSION, UNCONTROLLED 05/08/2009    Past Medical History:  Past Medical History  Diagnosis Date  . Hypertension   . Diabetes mellitus   . Chest pain     Troponins up to 0.12 while hospitalized for E.coli PNA  . 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 Bacteremia   . Anemia   . Narcolepsy    Past Surgical History: No past surgical history on file.  Assessment & Plan Clinical Impression: Patient is a 67 y.o. right-handed female with history of hypertension as well as diabetes mellitus, HTN and tobacco abuse who was admitted 10/24/2013 with left-sided weakness and slurred speech. Cranial CT scan negative. MRI of the brain showed acute nonhemorrhagic infarct anterior right pons as well as remote lacunar infarct posterior right lentiform nucleus. 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 Echocardiogram with ejection fraction of 60% grade 2 diastolic dysfunction. Carotid Dopplers without significant ICA stenosis. Neurology recommends ASA for secondary stroke prevention of thrombotic stroke due to SVD. Patient continues with left sided weakness with LLE instability.  Patient transferred to CIR on 10/26/2013 .    Patient currently requires min with  basic self-care skills secondary to decreased coordination and left hemiplegia .  Prior to hospitalization, patient could complete BADLs and IADLs with independent .  Patient will benefit from skilled intervention to increase independence with basic self-care skills and increase level of independence with iADL prior to discharge home independently with daughter living at home available PRN.  Anticipate patient will require no supervision secondary to modified independent goals and follow up home health.      Skilled Therapeutic Intervention Therapy session focused on ADL retraining and functional transfers. Completed functional transfers and bathing with min assist. Required mod assist for LB dressing and verbal cues for hemi dressing technique. Ambulated short household distances w/c into bathroom (approx 5-8 feet) with min assist hand held. Pt not fatiguing during therapy session and very motivated. BP taken after shower while pt sitting and was 151/75 and HR 80. Pt initiating use of LUE at nondominant level throughout therapy session as she used it as stabilizer. Therapist provided [facility] assist with LUE to wash RUE during bathing. Pt tearful at beginning of therapy session however able to be re-directed and comforted to continue with therapy session. Pt left sitting in w/c and PT entering at end of therapy session.    Precautions/Restrictions  Precautions Precautions: Fall Restrictions Weight Bearing Restrictions: No General   Vital Signs Therapy Vitals Temp: 98.3 F (36.8 C) Temp src: Oral Pulse Rate: 66 BP: 164/64 mmHg Patient Position, if appropriate: Lying Oxygen Therapy SpO2: 98 % O2 Device: None (Room air) Pain Pain Assessment Pain Assessment: No/denies pain Pain Score: 0-No pain Pain Type: Chronic pain Pain Location: Hip Pain Orientation: Left Pain  Descriptors / Indicators: Aching Pain Onset: Gradual Pain Intervention(s): Medication (See eMAR) Home Living/Prior  Functioning Home Living Family/patient expects to be discharged to:: Private residence Living Arrangements: Children Available Help at Discharge: Family;Available PRN/intermittently Type of Home: Apartment Home Access: Stairs to enter Entrance Stairs-Number of Steps: 2 Home Layout: One level Prior Function Driving: Yes ADL   Vision/Perception  Vision - History Baseline Vision: Wears glasses only for reading Visual History: Cataracts;Other (comment) (right eye) Patient Visual Report: No change from baseline Vision - Assessment Vision Assessment: Vision tested Tracking/Visual Pursuits: Other (comment) (difficulty tracking to left side)  Cognition Overall Cognitive Status: Within Functional Limits for tasks assessed Arousal/Alertness: Awake/alert Orientation Level: Oriented X4 Attention: Selective Selective Attention: Appears intact Memory: Appears intact Awareness: Appears intact Problem Solving: Appears intact Safety/Judgment: Appears intact Sensation Sensation Light Touch: Appears Intact Hot/Cold: Appears Intact Coordination Gross Motor Movements are Fluid and Coordinated: No Fine Motor Movements are Fluid and Coordinated: No Finger Nose Finger Test: unable to completed with LUE Motor    Mobility     Trunk/Postural Assessment     Balance   Extremity/Trunk Assessment RUE Assessment RUE Assessment: Within Functional Limits LUE Assessment LUE Assessment: Exceptions to WFL LUE AROM (degrees) Overall AROM Left Upper Extremity: Deficits (decreased ROM overall; tolerates full PROM)  FIM:  FIM - Eating Eating Activity: 5: Set-up assist for open containers FIM - Grooming Grooming Steps: Wash, rinse, dry face;Wash, rinse, dry hands;Brush, comb hair Grooming: 5: Set-up assist to obtain items FIM - Bathing Bathing Steps Patient Completed: Chest;Front perineal area;Right lower leg (including foot);Left lower leg (including foot);Buttocks;Right upper leg;Left upper  leg;Abdomen;Left Arm Bathing: 4: Min-Patient completes 8-9 108f 10 parts or 75+ percent FIM - Upper Body Dressing/Undressing Upper body dressing/undressing: 0: Wears gown/pajamas-no public clothing FIM - Lower Body Dressing/Undressing Lower body dressing/undressing steps patient completed: Thread/unthread right underwear leg;Thread/unthread right pants leg;Thread/unthread left pants leg;Pull pants up/down;Don/Doff right sock Lower body dressing/undressing: 3: Mod-Patient completed 50-74% of tasks FIM - Bed/Chair Transfer Bed/Chair Transfer: 5: Supine > Sit: Supervision (verbal cues/safety issues);4: Bed > Chair or W/C: Min A (steadying Pt. > 75%) FIM - Tub/Shower Transfers Tub/Shower Assistive Devices: Grab bars;Shower chair;Walk in Community education officer Transfers: 4-Into Tub/Shower: Min A (steadying Pt. > 75%/lift 1 leg);4-Out of Tub/Shower: Min A (steadying Pt. > 75%/lift 1 leg)   Refer to Care Plan for Long Term Goals  Recommendations for other services: None  Discharge Criteria: Patient will be discharged from OT if patient refuses treatment 3 consecutive times without medical reason, if treatment goals not met, if there is a change in medical status, if patient makes no progress towards goals or if patient is discharged from hospital.  The above assessment, treatment plan, treatment alternatives and goals were discussed and mutually agreed upon: by patient  Daneil Dan 10/27/2013, 8:46 AM

## 2013-10-27 NOTE — Progress Notes (Signed)
Subjective/Complaints: Slept well no pain  Want to drive my car  Review of Systems - Negative except L side weak  Objective: Vital Signs: Blood pressure 164/64, pulse 66, temperature 98.3 F (36.8 C), temperature source Oral, resp. rate 20, height 5\' 8"  (1.727 m), weight 79.6 kg (175 lb 7.8 oz), SpO2 98.00%. Mri Brain Without Contrast  10/25/2013   CLINICAL DATA:  TIA. Acute onset of slurred speech and left-sided weakness at 10:30 p.m. 10/23/2013.  EXAM: MRI HEAD WITHOUT CONTRAST  MRA HEAD WITHOUT CONTRAST  TECHNIQUE: Multiplanar, multiecho pulse sequences of the brain and surrounding structures were obtained without intravenous contrast. Angiographic images of the head were obtained using MRA technique without contrast.  COMPARISON:  CT head without contrast 10/24/2013  FINDINGS: MRI HEAD FINDINGS  An acute nonhemorrhagic infarct in the anterior right pons measures 9 mm maximally. Associated T2 hyperintensities are evident.  Additional nonacute white matter changes are present within the pons bilaterally. A remote lacunar infarct is noted within the posterior right lentiform nucleus. Atrophy and scattered white matter changes bilaterally are advanced for age. The ventricles are proportionate to the degree of atrophy. Flow is present in the major intracranial arteries.  Two punctate areas of remote hemorrhage are present in the anterior right frontal lobe white matter. No acute hemorrhage or mass lesion is present.  The globes and orbits are intact. Mild mucosal thickening is present in the maxillary sinuses bilaterally, right greater than left. There is fluid in the mastoid air cells bilaterally, left greater than right. No obstructing nasopharyngeal lesion is evident.  MRA HEAD FINDINGS  Atherosclerotic changes are present within the cavernous carotid arteries bilaterally. Mild to moderate focal stenosis is present in the left cavernous internal carotid artery. No other significant stenoses are present  in the internal carotid arteries. There is mild irregularity and narrowing of the left M1 segment without a significant stenosis. The anterior communicating artery is patent. Local mild narrowing is noted at the proximal left A1 segment. There is moderate segmental narrowing in the A2 branches bilaterally as well as the MCA branches bilaterally.  The left vertebral artery is the dominant vessel. A high-grade stenosis is present at the distal right vertebral artery. There is mild irregularity of the proximal basilar artery with narrowing of approximately 50%. The more distal basilar artery is within normal limits. There is significant signal loss in the proximal posterior cerebral arteries bilaterally. PCA branch vessel attenuation is greatest on the right.  IMPRESSION: 1. Acute nonhemorrhagic infarct within the anterior right pons. 2. Focal narrowing of the proximal basilar artery measuring up to 50% corresponds to the areas of acute infarction. 3. Diffuse intracranial atherosclerotic changes are evident with the most significant disease in the proximal A2 segments bilaterally and proximal posterior cerebral arteries. 4. Age advanced atrophy and diffuse white matter disease. 5. Remote lacunar infarct of the posterior right basilar ganglia. These results will be called to the ordering clinician or representative by the Radiologist Assistant, and communication documented in the PACS Dashboard.   Electronically Signed   By: Gennette Pac M.D.   On: 10/25/2013 16:55   Mr Laurie [name] Head/brain Wo Cm  10/25/2013   CLINICAL DATA:  TIA. Acute onset of slurred speech and left-sided weakness at 10:30 p.m. 10/23/2013.  EXAM: MRI HEAD WITHOUT CONTRAST  MRA HEAD WITHOUT CONTRAST  TECHNIQUE: Multiplanar, multiecho pulse sequences of the brain and surrounding structures were obtained without intravenous contrast. Angiographic images of the head were obtained using MRA technique without contrast.  COMPARISON:  CT head without contrast  10/24/2013  FINDINGS: MRI HEAD FINDINGS  An acute nonhemorrhagic infarct in the anterior right pons measures 9 mm maximally. Associated T2 hyperintensities are evident.  Additional nonacute white matter changes are present within the pons bilaterally. A remote lacunar infarct is noted within the posterior right lentiform nucleus. Atrophy and scattered white matter changes bilaterally are advanced for age. The ventricles are proportionate to the degree of atrophy. Flow is present in the major intracranial arteries.  Two punctate areas of remote hemorrhage are present in the anterior right frontal lobe white matter. No acute hemorrhage or mass lesion is present.  The globes and orbits are intact. Mild mucosal thickening is present in the maxillary sinuses bilaterally, right greater than left. There is fluid in the mastoid air cells bilaterally, left greater than right. No obstructing nasopharyngeal lesion is evident.  MRA HEAD FINDINGS  Atherosclerotic changes are present within the cavernous carotid arteries bilaterally. Mild to moderate focal stenosis is present in the left cavernous internal carotid artery. No other significant stenoses are present in the internal carotid arteries. There is mild irregularity and narrowing of the left M1 segment without a significant stenosis. The anterior communicating artery is patent. Local mild narrowing is noted at the proximal left A1 segment. There is moderate segmental narrowing in the A2 branches bilaterally as well as the MCA branches bilaterally.  The left vertebral artery is the dominant vessel. A high-grade stenosis is present at the distal right vertebral artery. There is mild irregularity of the proximal basilar artery with narrowing of approximately 50%. The more distal basilar artery is within normal limits. There is significant signal loss in the proximal posterior cerebral arteries bilaterally. PCA branch vessel attenuation is greatest on the right.  IMPRESSION:  1. Acute nonhemorrhagic infarct within the anterior right pons. 2. Focal narrowing of the proximal basilar artery measuring up to 50% corresponds to the areas of acute infarction. 3. Diffuse intracranial atherosclerotic changes are evident with the most significant disease in the proximal A2 segments bilaterally and proximal posterior cerebral arteries. 4. Age advanced atrophy and diffuse white matter disease. 5. Remote lacunar infarct of the posterior right basilar ganglia. These results will be called to the ordering clinician or representative by the Radiologist Assistant, and communication documented in the PACS Dashboard.   Electronically Signed   By: Gennette Pac M.D.   On: 10/25/2013 16:55   Results for orders placed during the hospital encounter of 10/26/13 (from the past 72 hour(s))  GLUCOSE, CAPILLARY     Status: Abnormal   Collection Time    10/26/13  9:26 PM      Result Value Range   Glucose-Capillary 124 (*) 70 - 99 mg/dL   Comment 1 Notify RN    CBC WITH DIFFERENTIAL     Status: Abnormal   Collection Time    10/27/13  3:34 AM      Result Value Range   WBC 7.6  4.0 - 10.5 K/uL   RBC 3.89  3.87 - 5.11 MIL/uL   Hemoglobin 12.5  12.0 - 15.0 g/dL   HCT 09.8 (*) 11.9 - 14.7 %   MCV 92.0  78.0 - 100.0 fL   MCH 32.1  26.0 - 34.0 pg   MCHC 34.9  30.0 - 36.0 g/dL   RDW 82.9  56.2 - 13.0 %   Platelets 256  150 - 400 K/uL   Neutrophils Relative % 49  43 - 77 %   Neutro Abs  3.7  1.7 - 7.7 K/uL   Lymphocytes Relative 41  12 - 46 %   Lymphs Abs 3.1  0.7 - 4.0 K/uL   Monocytes Relative 8  3 - 12 %   Monocytes Absolute 0.6  0.1 - 1.0 K/uL   Eosinophils Relative 2  0 - 5 %   Eosinophils Absolute 0.2  0.0 - 0.7 K/uL   Basophils Relative 0  0 - 1 %   Basophils Absolute 0.0  0.0 - 0.1 K/uL  COMPREHENSIVE METABOLIC PANEL     Status: Abnormal   Collection Time    10/27/13  3:34 AM      Result Value Range   Sodium 142  135 - 145 mEq/L   Potassium 3.1 (*) 3.5 - 5.1 mEq/L   Chloride 107   96 - 112 mEq/L   CO2 24  19 - 32 mEq/L   Glucose, Bld 117 (*) 70 - 99 mg/dL   BUN 16  6 - 23 mg/dL   Creatinine, Ser 1.61  0.50 - 1.10 mg/dL   Calcium 8.6  8.4 - 09.6 mg/dL   Total Protein 6.2  6.0 - 8.3 g/dL   Albumin 3.2 (*) 3.5 - 5.2 g/dL   AST 10  0 - 37 U/L   ALT 6  0 - 35 U/L   Alkaline Phosphatase 67  39 - 117 U/L   Total Bilirubin 0.3  0.3 - 1.2 mg/dL   GFR calc non Af Amer 88 (*) >90 mL/min   GFR calc Af Amer >90  >90 mL/min   Comment: (NOTE)     The eGFR has been calculated using the CKD EPI equation.     This calculation has not been validated in all clinical situations.     eGFR's persistently <90 mL/min signify possible Chronic Kidney     Disease.  GLUCOSE, CAPILLARY     Status: Abnormal   Collection Time    10/27/13  7:30 AM      Result Value Range   Glucose-Capillary 110 (*) 70 - 99 mg/dL   Comment 1 Notify RN       HEENT: normal and throat clear Cardio: RRR and no murmur Resp: CTA B/L and unlabored GI: BS positive and non distended Extremity:  Pulses positive and No Edema Skin:   Intact Neuro: Alert/Oriented, Cranial Nerve II-XII normal, Normal Sensory, Abnormal Motor 2-/5 Left bi tri, delt and grip, 3-/5 L HF,KE,ADF and Abnormal FMC Ataxic/ dec FMC Musc/Skel:  Normal Gen NAD   Assessment/Plan: 1. Functional deficits secondary to Right pointine infarct with L Hemiparesis which require 3+ hours per day of interdisciplinary therapy in a comprehensive inpatient rehab setting. Physiatrist is providing close team supervision and 24 hour management of active medical problems listed below. Physiatrist and rehab team continue to assess barriers to discharge/monitor patient progress toward functional and medical goals. FIM:       FIM - Toileting Toileting steps completed by patient: Adjust clothing prior to toileting;Performs perineal hygiene;Adjust clothing after toileting Toileting: 5: Supervision: Safety issues/verbal cues  FIM - Quarry manager Devices: Bedside commode Toilet Transfers: 4-To toilet/BSC: Min A (steadying Pt. > 75%);4-From toilet/BSC: Min A (steadying Pt. > 75%)        Comprehension Comprehension Mode: Auditory Comprehension: 5-Follows basic conversation/direction: With no assist  Expression Expression Mode: Verbal Expression: 5-Expresses basic needs/ideas: With no assist  Social Interaction Social Interaction: 7-Interacts appropriately with others - No medications needed.  Problem Solving Problem Solving: 5-Solves  basic problems: With no assist  Memory Memory: 7-Complete Independence: No helper  Medical Problem List and Plan:  1. DVT Prophylaxis/Anticoagulation: Pharmaceutical: Lovenox  2. Pain Management: n/a  3. Mood: Motivated to get better. Will have LCSW follow for evaluation.  4. Neuropsych: This patient is capable of making decisions on her own behalf.  5 Diabetes: Will monitor with ac/hs checks--use SSI for tighter control. Continue metformin bid.  6. HTN: Will monitor with bid checks--avoid hypotension. Was started on Prinivil and HCTZ--continue and recheck lytes showed only hypoK will correct LOS (Days) 1 A FACE TO FACE EVALUATION WAS PERFORMED  Laurie [name] E 10/27/2013, 8:37 AM

## 2013-10-27 NOTE — Progress Notes (Signed)
Occupational Therapy Session Note  Patient Details  Name: EMI LYMON MRN: [id number] Date of Birth: [birth year]  Today's Date: 10/27/2013 Time: 1330-1400 Time Calculation (min): 30 min  Short Term Goals: Week 1:  OT Short Term Goal 1 (Week 1): STGs=LTGs secondary to short ELOS  Skilled Therapeutic Interventions/Progress Updates:  Pt received toileting with nursing.  After completing toileting, pt able to ambulate 15 ft to w/c with hand held assist.  Tx session focused on UE strengthening/ NMR via weight bearing on L side.  Pt utilized BUE elbow flexion with weighted balls (2 different diameters and weights).  Pt able to incorporate affected LUE.  Pt did 2 sets of exercises seated in w/c.  Transferred to mat and completed 2 additional sets sitting on edge of mat to increase demand on postural control.  Pt ambulated 15 feet with hand held assist to high/low table.  Pt's LUE positioned in weight bearing placement.  Pt used RUE to cross mid-line, transferring weight to L side in order to complete card matching game.  Pt returned to w/c after 15 minutes standing.  Returned to room in w/c with all needed items within reach, prior to medication.   Therapy Documentation Precautions:  Precautions Precautions: Fall Restrictions Weight Bearing Restrictions: No General:   Vital Signs: Therapy Vitals Temp: 98.4 F (36.9 C) Temp src: Oral Pulse Rate: 68 Resp: 18 BP: 138/74 mmHg Patient Position, if appropriate: Sitting Oxygen Therapy SpO2: 100 % O2 Device: None (Room air) Pain: No c/o pain during therapy session.   Other Treatments:    See FIM for current functional status  Therapy/Group: Individual Therapy  Daneil Dan 10/27/2013, 3:26 PM

## 2013-10-27 NOTE — Progress Notes (Signed)
Labs today with low K+--likely due to IVF and diuretics will supplement tid x 2 days then start on daily maintenance  dose. Recheck labs in am.

## 2013-10-27 NOTE — Progress Notes (Signed)
UR complete.  Breshay Ilg RN, MSN 

## 2013-10-27 NOTE — Evaluation (Signed)
The assessment and plan has been reviewed and SLP is in agreement. Edan Juday, M.A., CCC-SLP [phone]  

## 2013-10-27 NOTE — Progress Notes (Signed)
Social Work Assessment and Plan Social Work Assessment and Plan  Patient Details  Name: Laurie [name] MRN: [id number] Date of Birth: 23-Oct-1946  Today's Date: 10/27/2013  Problem List:  Patient Active Problem List   Diagnosis Date Noted  . Chronic diastolic heart failure, grade 2 10/26/2013  . Dyslipidemia 10/26/2013  . CVA (cerebral infarction) 10/25/2013  . Arthralgia of hip 10/25/2013  . ANEMIA, NORMOCYTIC 05/17/2009  . DIABETES MELLITUS, UNCONTROLLED 05/08/2009  . ANXIETY 05/08/2009  . HYPERTENSION, UNCONTROLLED 05/08/2009   Past Medical History:  Past Medical History  Diagnosis Date  . Hypertension   . Diabetes mellitus   . Chest pain     Troponins up to 0.12 while hospitalized for E.coli PNA  . 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 Bacteremia   . Anemia   . Narcolepsy    Past Surgical History: No past surgical history on file. Social History:  reports that she has been smoking.  She does not have any smokeless tobacco history on file. She reports that she does not drink alcohol or use illicit drugs.  Family / Support Systems Marital Status: Divorced Patient Roles: Parent;Caregiver Children: Shanda Bumps daughter  [phone] Other Supports: Velma Williamson-sister  [phone] Anticipated Caregiver: Daughter works during the day and can be there at night.   Ability/Limitations of Caregiver: Works during the day Caregiver Availability: Evenings only Family Dynamics: Pt is close with daughter and siblings, she has always been the one to help the others.  She is not used to being the patient and wants to regain her independence as soon as possible.  Social History Preferred language: English Religion: [employer] Cultural Background: No issues Education: [employer] Read: Yes Write: Yes Employment Status: Disabled Fish farm manager Issues: No issues Guardian/Conservator: None-according to MD pt is capable of making her own  decisions while here   Abuse/Neglect Physical Abuse: Denies Verbal Abuse: Denies Sexual Abuse: Denies Exploitation of patient/patient's resources: Denies Self-Neglect: Denies  Emotional Status Pt's affect, behavior adn adjustment status: Pt is motivated and plans on working hard to reach her goal of driving her new Beetle.  She is stubborn and tough according to her and she will not quit until she reaches this goal.  She is aware it may take time but she will get there. Recent Psychosocial Issues: Other medical issues-needs cataract removed but eneds to see PCP first. Pyschiatric History: No history deferred depression screen due to pt refusal and felt it was not necessary.  Will monitor her coping while here and have Neuro-psych see would be helpful with all of the loss she has experienced recently.  Will also provide support Substance Abuse History: Tobacco-plans to reduce or quit.  Aware of the resources available to her in the comunity  Patient / Family Perceptions, Expectations & Goals Pt/Family understanding of illness & functional limitations: Pt is able to explain her stroke and deficits.  She has a good understanding and is encouraged by the progress she has made thus far.  She is future oriented and has goals for herself while here and once home. Premorbid pt/family roles/activities: Mother, Grandmother, retiree, Sister, Church member, etc Anticipated changes in roles/activities/participation: resume Pt/family expectations/goals: Pt states: " I want to be independent enough to drive my new Beetle."  " It is a five speed, so I need to move my hand."    [employer]: None Premorbid Home Care/DME Agencies: None Transportation available at discharge: Family Resource referrals recommended:  Support group (specify) (CVA Support Group)  Discharge Planning Living Arrangements: Children Support Systems: Children;Other relatives;Friends/neighbors;Church/faith  community Type of Residence: Private residence Insurance Resources: Medicare;Medicaid (specify county) ([place]) Financial Resources: SSD;Family Support Financial Screen Referred: No Living Expenses: Rent Money Management: Patient;Family Does the patient have any problems obtaining your medications?: No Home Management: Self and daughter Patient/Family Preliminary Plans: Return home with daughter and grandaughter.  Duaghter works during the day and pt will be alone while she is gone.  Needs to be mod/i level to return home with daughter.   Social Work Anticipated Follow Up Needs: HH/OP;Support Group  Clinical Impression Feisty female who is ready to work and achieve her goals.  Should do well here and reach her goals of mod/i level.  Supportive daughter and sister.  Will work on obtaining a PCP For pt and discharge plan.  Lucy Chris 10/27/2013, 11:15 AM

## 2013-10-27 NOTE — Progress Notes (Signed)
Nutrition Brief Note  Patient identified on the Malnutrition Screening Tool (MST) Report  Wt Readings from Last 15 Encounters:  10/26/13 175 lb 7.8 oz (79.6 kg)  10/25/13 176 lb 9.4 oz (80.1 kg)  08/30/09 182 lb 14.4 oz (82.963 kg)  05/24/09 177 lb 12.8 oz (80.65 kg)  05/17/09 179 lb 9.6 oz (81.466 kg)    Body mass index is 26.69 kg/(m^2). Patient meets criteria for overweight based on current BMI.   Lab Results  Component Value Date   HGBA1C 7.5* 10/25/2013  HgBA1C improved from 11.1 in 2010.   Current diet order is CHO Mod Medium, patient is consuming approximately 50-100% of meals at this time. Labs and medications reviewed.   Pt admitted to CIR s/p acute thrombotic pontine infarct. RD met with pt who reports good appetite PTA, although she admits to being a "small eater."  Pt states her appetite has been good and her intake has been excellent.  Pt without nutrition-related concerns. No nutrition interventions warranted at this time. If nutrition issues arise, please consult RD.   Loyce Dys, MS RD LDN Clinical Inpatient Dietitian Pager: [phone] Weekend/After hours pager: [phone]

## 2013-10-27 NOTE — Evaluation (Signed)
Speech Language Pathology Assessment and Plan  Patient Details  Name: Laurie [name] MRN: [id number] Date of Birth: Apr 29, 1946  SLP Diagnosis: Dysarthria;Cognitive Impairments  Rehab Potential: Good ELOS: 10 days   Today's Date: 10/27/2013 Time: 4098-1191 Time Calculation (min): 57 min  Problem List:  Patient Active Problem List   Diagnosis Date Noted  . Chronic diastolic heart failure, grade 2 10/26/2013  . Dyslipidemia 10/26/2013  . CVA (cerebral infarction) 10/25/2013  . Arthralgia of hip 10/25/2013  . ANEMIA, NORMOCYTIC 05/17/2009  . DIABETES MELLITUS, UNCONTROLLED 05/08/2009  . ANXIETY 05/08/2009  . HYPERTENSION, UNCONTROLLED 05/08/2009   Past Medical History:  Past Medical History  Diagnosis Date  . Hypertension   . Diabetes mellitus   . Chest pain     Troponins up to 0.12 while hospitalized for E.coli PNA  . 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 Bacteremia   . Anemia   . Narcolepsy    Past Surgical History: No past surgical history on file.  Assessment / Plan / Recommendation Clinical Impression  Laurie [name] is a 67 y.o. right-handed female with history of hypertension as well as diabetes mellitus, HTN and tobacco abuse who was admitted 10/24/2013 with left-sided weakness and slurred speech. Cranial CT scan negative. MRI of the brain showed acute nonhemorrhagic infarct anterior right pons as well as remote lacunar infarct posterior right lentiform nucleus. 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 Echocardiogram with ejection fraction of 60% grade 2 diastolic dysfunction. Carotid Dopplers without significant ICA stenosis. Neurology recommends ASA for secondary stroke prevention of thrombotic stroke due to SVD. Patient continues with left sided weakness with LLE instability. Admitted to inpatient rehab today after consultation with the rehab service. Patient transferred to CIR  on 10/26/2013.    Cognitive-linguistic evaluation completed on 10/27/2013.  Patient presents with mild cognitive impairments and mild to moderate dysarthria.  Cognitive impairments are characterized by impaired anticipatory awareness and complex problem solving.  Additionally, patient's memory is negatively affected by her working memory and processing speed.  Linguistically, patient's dysarthria affects her intelligibility during sentences and conversation.  Patient reports that she notices a significant difference in her speech, and wants to work to remediate it in therapy.  Previous to her CVA, patient lived with daughter who works full time (7 days/week) and 60 year old granddaughter; she is mostly alone during the day and is on disability.  She reports that she manages her own medication and shares the responsibility of money management with her daughter.  It is recommended that patient receive skilled SLP intervention focusing on the following: solving complex, functional problems; anticipatory awareness; memory compensatory strategies; speech intelligibility strategies.  Addressing previously stated deficits in skilled therapy will maximize her overall safety and functional independence upon her discharge home.    SLP Assessment  Patient will need skilled Speech Lanaguage Pathology Services during CIR admission    Recommendations  Patient destination: Home Follow up Recommendations: None Equipment Recommended: None recommended by SLP    SLP Frequency 5 out of 7 days   SLP Treatment/Interventions Cognitive remediation/compensation;Cueing hierarchy;Functional tasks;Internal/external aids;Medication managment;Patient/family education;Therapeutic Activities    Pain Pain Assessment Pain Assessment: No/denies pain Pain Score: 0-No pain Prior Functioning Cognitive/Linguistic Baseline: Within functional limits Type of Home: Apartment  Lives With: Daughter;Family Available Help at Discharge:  Family;Available PRN/intermittently (Daughter works full time; granddaughter in school) Vocation: On disability  Short Term  Goals: Week 1: SLP Short Term Goal 1 (Week 1): Patient will demonstrate anticipatory awareness with Supervision questioning and verbal cues. SLP Short Term Goal 2 (Week 1): Patient will solve complex, functional problems with Supervision questioning and verbal cues. SLP Short Term Goal 3 (Week 1): Patient will recall three memory compensatory strategies with Mod I. SLP Short Term Goal 4 (Week 1): Patient will utilize memory compensatory strategies during complex, functional tasks with Supervision verbal cues. SLP Short Term Goal 5 (Week 1): Patient will recall three speech intelligibility strategies with Mod I. SLP Short Term Goal 6 (Week 1): Patient will utilize speech intelligibility strategies during structured task with Supervision verbal cues.  See FIM for current functional status Refer to Care Plan for Long Term Goals  Recommendations for other services: None  Discharge Criteria: Patient will be discharged from SLP if patient refuses treatment 3 consecutive times without medical reason, if treatment goals not met, if there is a change in medical status, if patient makes no progress towards goals or if patient is discharged from hospital.  The above assessment, treatment plan, treatment alternatives and goals were discussed and mutually agreed upon: by patient  Levora Angel 10/27/2013, 1:49 PM

## 2013-10-27 NOTE — Care Management Note (Signed)
[facility] Individual Statement of Services  Patient Name:  Laurie [name]  Date:  10/27/2013  Welcome to the [facility].  Our goal is to provide you with an individualized program based on your diagnosis and situation, designed to meet your specific needs.  With this comprehensive rehabilitation program, you will be expected to participate in at least 3 hours of rehabilitation therapies Monday-Friday, with modified therapy programming on the weekends.  Your rehabilitation program will include the following services:  Physical Therapy (PT), Occupational Therapy (OT), Speech Therapy (ST), 24 hour per day rehabilitation nursing, Neuropsychology, Case Management (Social Worker), Rehabilitation Medicine, Nutrition Services and Pharmacy Services  Weekly team conferences will be held on Wednesday to discuss your progress.  Your Social Worker will talk with you frequently to get your input and to update you on team discussions.  Team conferences with you and your family in attendance may also be held.  Expected length of stay: 7-10 days Overall anticipated outcome: mod/i -supervision level  Depending on your progress and recovery, your program may change. Your Social Worker will coordinate services and will keep you informed of any changes. Your Social Worker's name and contact numbers are listed  below.  The following services may also be recommended but are not provided by the [facility]:   Driving Evaluations  Home Health Rehabiltiation Services  Outpatient Rehabilitation Services    Arrangements will be made to provide these services after discharge if needed.  Arrangements include referral to agencies that provide these services.  Your insurance has been verified to be:  Medicare & medicaid-pays Medicare co-pays Your primary doctor is:  None  Pertinent information will be shared with your doctor and your insurance  company.  Social Worker:  Dossie Der, SW [phone] or ([phone]  Information discussed with and copy given to patient by: Lucy Chris, 10/27/2013, 10:58 AM

## 2013-10-27 NOTE — Evaluation (Signed)
Physical Therapy Assessment and Plan  Patient Details  Name: CANDIS KABEL MRN: [id number] Date of Birth: [birth year]  PT Diagnosis: Abnormality of gait, Coordination disorder, Hemiparesis non-dominant and Muscle weakness Rehab Potential: Excellent ELOS: 7-10 days   Today's Date: 10/27/2013 Time: 7829-5621 Time Calculation (min): 57 min  Problem List:  Patient Active Problem List   Diagnosis Date Noted  . Chronic diastolic heart failure, grade 2 10/26/2013  . Dyslipidemia 10/26/2013  . CVA (cerebral infarction) 10/25/2013  . Arthralgia of hip 10/25/2013  . ANEMIA, NORMOCYTIC 05/17/2009  . DIABETES MELLITUS, UNCONTROLLED 05/08/2009  . ANXIETY 05/08/2009  . HYPERTENSION, UNCONTROLLED 05/08/2009    Past Medical History:  Past Medical History  Diagnosis Date  . Hypertension   . Diabetes mellitus   . Chest pain     Troponins up to 0.12 while hospitalized for E.coli PNA  . 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 Bacteremia   . Anemia   . Narcolepsy    Past Surgical History: No past surgical history on file.  Assessment & Plan Clinical Impression: AUTYM SIESS is a 67 y.o. right-handed female with history of hypertension as well as diabetes mellitus, HTN and tobacco abuse who was admitted 10/24/2013 with left-sided weakness and slurred speech. Cranial CT scan negative. MRI of the brain showed acute nonhemorrhagic infarct anterior right pons as well as remote lacunar infarct posterior right lentiform nucleus. 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 Echocardiogram with ejection fraction of 60% grade 2 diastolic dysfunction. Carotid Dopplers without significant ICA stenosis. Neurology recommends ASA for secondary stroke prevention of thrombotic stroke due to SVD. Patient continues with left sided weakness with LLE instability. Admitted to inpatient rehab today after consultation with the rehab  service. Patient transferred to CIR on 10/26/2013 .   Patient currently requires min with mobility secondary to muscle weakness, decreased cardiorespiratoy endurance, impaired timing and sequencing, unbalanced muscle activation and decreased coordination and decreased standing balance, decreased postural control, hemiplegia and decreased balance strategies.  Prior to hospitalization, patient was independent  with mobility and lived with Daughter;Family (daughter and granddaughter) in a Apartment home.  Home access is 2Stairs to enter.  Patient will benefit from skilled PT intervention to maximize safe functional mobility, minimize fall risk and decrease caregiver burden for planned discharge home with intermittent assist.  Anticipate patient will benefit from follow up OP at discharge.  PT - End of Session Activity Tolerance: Tolerates 30+ min activity without fatigue Endurance Deficit: No PT Assessment Rehab Potential: Excellent Barriers to Discharge: Decreased caregiver support Barriers to Discharge Comments: Intermittent assist available as daughter works 2 jobs (7 days/week) PT Patient demonstrates impairments in the following area(s): Balance;Endurance;Motor;Safety PT Transfers Functional Problem(s): Bed Mobility;Bed to Chair;Car;Furniture;Floor PT Locomotion Functional Problem(s): Ambulation;Wheelchair Mobility;Stairs PT Plan PT Intensity: Minimum of 1-2 x/day ,45 to 90 minutes PT Frequency: 5 out of 7 days PT Duration Estimated Length of Stay: 7-10 days PT Treatment/Interventions: Ambulation/gait training;Balance/vestibular training;Community reintegration;Discharge planning;Neuromuscular re-education;Functional mobility training;DME/adaptive equipment instruction;Disease management/prevention;Pain management;Patient/family education;Psychosocial support;Splinting/orthotics;UE/LE Coordination activities;UE/LE Strength taining/ROM;Therapeutic Exercise;Therapeutic Activities;Stair  training;Wheelchair propulsion/positioning PT Transfers Anticipated Outcome(s): mod I transfers PT Locomotion Anticipated Outcome(s): mod I gait and supervision stairs PT Recommendation Recommendations for Other Services: Speech consult Follow Up Recommendations: Outpatient PT Patient destination: Home Equipment Recommended: Cane Equipment Details: DME recommendations TBD upon discharge  Skilled Therapeutic Intervention Skilled therapeutic intervention initiated after completion of evaluation. Patient  left sitting in wheelchair with all needs within reach.  PT Evaluation Precautions/Restrictions Precautions Precautions: Fall Restrictions Weight Bearing Restrictions: No General Chart Reviewed: Yes Family/Caregiver Present: No  Pain Pain Assessment Pain Assessment: No/denies pain Pain Score: 0-No pain Home Living/Prior Functioning Home Living Available Help at Discharge: Family;Available PRN/intermittently Type of Home: Apartment Home Access: Stairs to enter Entrance Stairs-Number of Steps: 2 Entrance Stairs-Rails: Right Home Layout: One level Additional Comments: Unsure if RW could fit in bathroom  Lives With: Daughter;Family (daughter and granddaughter) Prior Function Level of Independence: Independent with gait;Independent with transfers;Independent with homemaking with ambulation  Able to Take Stairs?: Yes Driving: Yes Vocation: On disability Leisure: Hobbies-yes (Comment) Comments: loves to dance Vision/Perception  Vision - History Baseline Vision: Wears glasses only for reading Visual History: Cataracts (R eye-not repaired) Patient Visual Report: No change from baseline Vision - Assessment Vision Assessment: Vision tested Tracking/Visual Pursuits: Other (comment) (difficulty tracking to left side)  Cognition Overall Cognitive Status: Within Functional Limits for tasks assessed Arousal/Alertness: Awake/alert Orientation Level: Oriented X4 Attention:  Selective Selective Attention: Appears intact Memory: Appears intact Awareness: Appears intact Problem Solving: Appears intact Safety/Judgment: Appears intact Sensation Sensation Light Touch: Appears Intact Hot/Cold: Appears Intact Proprioception: Appears Intact Coordination Gross Motor Movements are Fluid and Coordinated: No (L UE limited) Fine Motor Movements are Fluid and Coordinated: No (L UE limited) Coordination and Movement Description: Decreased speed and accuracy with rapid, alternating movements of LEs; unable to test with UEs secondary to decreased strength in L UE Finger Nose Finger Test: unable to completed with LUE Heel Shin Test: Slight dysmetria on L LE Motor  Motor Motor: Hemiplegia;Abnormal tone Motor - Skilled Clinical Observations: Mild hemiplegia L side  Mobility Bed Mobility Bed Mobility: Supine to Sit;Sit to Supine Supine to Sit: 5: Supervision;HOB flat Supine to Sit Details: Verbal cues for precautions/safety Sit to Supine: 5: Supervision;HOB flat Sit to Supine - Details: Verbal cues for precautions/safety Transfers Transfers: Yes Sit to Stand: 4: Min guard;From chair/3-in-1;With armrests;With upper extremity assist;From bed Stand to Sit: 4: Min guard;With upper extremity assist;With armrests;To chair/3-in-1;To bed Stand Pivot Transfers: 4: Min assist;With armrests Stand Pivot Transfer Details: Manual facilitation for weight shifting;Verbal cues for precautions/safety Stand Pivot Transfer Details (indicate cue type and reason): with R HHA Locomotion  Ambulation Ambulation: Yes Ambulation/Gait Assistance: 4: Min assist Ambulation Distance (Feet): 75 Feet Assistive device: 1 person hand held assist (R HHA) Ambulation/Gait Assistance Details: Verbal cues for gait pattern;Verbal cues for precautions/safety;Manual facilitation for weight shifting Ambulation/Gait Assistance Details: Patient instructed in gait training 54' x1 with R HHA and 30'x1 (from mat  to stairs) without AD, requires minA for both. Manual facilitation for increased weight shift to the L. Gait Gait: Yes Gait Pattern: Impaired Gait Pattern: Step-through pattern;Decreased step length - left;Decreased step length - right;Decreased stance time - left;Decreased stride length;Decreased hip/knee flexion - left;Decreased dorsiflexion - left;Narrow base of support Stairs / Additional Locomotion Stairs: Yes Stairs Assistance: 4: Min assist Stairs Assistance Details: Verbal cues for gait pattern;Verbal cues for precautions/safety;Verbal cues for sequencing;Verbal cues for technique;Manual facilitation for weight shifting Stair Management Technique: One rail Right;Step to pattern;Forwards Number of Stairs: 5 Height of Stairs: 6 Wheelchair Mobility Wheelchair Mobility: Yes Wheelchair Assistance: 4: Administrator, sports Details: Verbal cues for precautions/safety;Verbal cues for sequencing;Verbal cues for Diplomatic Services operational officer: Right upper extremity;Both lower extermities Wheelchair Parts Management: Needs assistance Distance: 55  Trunk/Postural Assessment  Cervical Assessment Cervical Assessment: Within Functional Limits Thoracic Assessment Thoracic Assessment: Within Functional Limits  Lumbar Assessment Lumbar Assessment: Within Functional Limits Postural Control Postural Control: Within Functional Limits  Balance Balance Balance Assessed: Yes Static Sitting Balance Static Sitting - Balance Support: No upper extremity supported;Feet supported Static Sitting - Level of Assistance: 5: Stand by assistance Static Sitting - Comment/# of Minutes: 3 Static Standing Balance Static Standing - Balance Support: During functional activity;Right upper extremity supported;No upper extremity supported Static Standing - Level of Assistance: 4: Min assist Dynamic Standing Balance Dynamic Standing - Balance Support: During functional activity;Right upper extremity  supported;No upper extremity supported Dynamic Standing - Level of Assistance: 4: Min assist Extremity Assessment  RLE Assessment RLE Assessment: Within Functional Limits (Grossly 4+/5) LLE Assessment LLE Assessment: Exceptions to [facility] LLE Strength LLE Overall Strength: Deficits LLE Overall Strength Comments: Grossly 2+/5  FIM:  FIM - Bed/Chair Transfer Bed/Chair Transfer Assistive Devices: Arm rests Bed/Chair Transfer: 5: Supine > Sit: Supervision (verbal cues/safety issues);5: Sit > Supine: Supervision (verbal cues/safety issues);4: Chair or W/C > Bed: Min A (steadying Pt. > 75%);4: Bed > Chair or W/C: Min A (steadying Pt. > 75%) FIM - Locomotion: Wheelchair Distance: 55 Locomotion: Wheelchair: 2: Travels 50 - 149 ft with minimal assistance (Pt.>75%) FIM - Locomotion: Ambulation Locomotion: Ambulation Assistive Devices: Other (comment) (R HHA) Ambulation/Gait Assistance: 4: Min assist Locomotion: Ambulation: 2: Travels 50 - 149 ft with minimal assistance (Pt.>75%) FIM - Locomotion: Stairs Locomotion: Building control surveyor: Hand rail - 1 Locomotion: Stairs: 2: Up and Down 4 - 11 stairs with minimal assistance (Pt.>75%)   Refer to Care Plan for Long Term Goals  Recommendations for other services: None  Discharge Criteria: Patient will be discharged from PT if patient refuses treatment 3 consecutive times without medical reason, if treatment goals not met, if there is a change in medical status, if patient makes no progress towards goals or if patient is discharged from hospital.  The above assessment, treatment plan, treatment alternatives and goals were discussed and mutually agreed upon: by patient  Chipper Herb. Gaines Cartmell, PT, DPT 10/27/2013, 9:52 AM

## 2013-10-28 ENCOUNTER — Inpatient Hospital Stay (HOSPITAL_COMMUNITY): Payer: Medicare Other | Admitting: Speech Pathology

## 2013-10-28 ENCOUNTER — Inpatient Hospital Stay (HOSPITAL_COMMUNITY): Payer: Medicare Other

## 2013-10-28 ENCOUNTER — Inpatient Hospital Stay (HOSPITAL_COMMUNITY): Payer: Medicare Other | Admitting: *Deleted

## 2013-10-28 DIAGNOSIS — I10 Essential (primary) hypertension: Secondary | ICD-10-CM

## 2013-10-28 DIAGNOSIS — E1165 Type 2 diabetes mellitus with hyperglycemia: Secondary | ICD-10-CM

## 2013-10-28 DIAGNOSIS — I6330 Cerebral infarction due to thrombosis of unspecified cerebral artery: Secondary | ICD-10-CM

## 2013-10-28 DIAGNOSIS — G8110 Spastic hemiplegia affecting unspecified side: Secondary | ICD-10-CM

## 2013-10-28 LAB — BASIC METABOLIC PANEL
BUN: 19 mg/dL (ref 6–23)
Chloride: 104 mEq/L (ref 96–112)
GFR calc Af Amer: >90 mL/min (ref >90)
GFR calc non Af Amer: 89 mL/min — ABNORMAL LOW (ref >90)
Potassium: 3.5 mEq/L (ref 3.5–5.1)
Sodium: 139 mEq/L (ref 135–145)

## 2013-10-28 LAB — GLUCOSE, CAPILLARY
Glucose-Capillary: 106 mg/dL — ABNORMAL HIGH (ref 70–99)
Glucose-Capillary: 134 mg/dL — ABNORMAL HIGH (ref 70–99)

## 2013-10-28 NOTE — Progress Notes (Signed)
Occupational Therapy Session Note  Patient Details  Name: Laurie [name] MRN: [id number] Date of Birth: 01/10/1946  Today's Date: 10/28/2013 Time: 0730-0830 and 1130-1200 Time Calculation (min): 60 min and 30 min  Short Term Goals: Week 1:  OT Short Term Goal 1 (Week 1): STGs=LTGs secondary to short ELOS  Skilled Therapeutic Interventions/Progress Updates:    Session 1: Therapy session focused on functional transfers, dynamic standing balance, and use of LUE during ADL retraining. Pt received supine in bed and completed supine>sit at supervision. Pt completed sit>stand with posterior LOB and min assist to correct. Educated on proper positioning of feet prior to sit<>stand. Pt then ambulated into room with HHA and min assist. Competed bathing with [facility] assist for LUE to wash RUE. Pt initiated using BUE (HOH assist) to wash BLE. Pt used crossover technique with min verbal cues to thread BLE into LB clothing. Required verbal cues for sequencing of hemi dressing technique. At end of session pt left sitting in w/c with RN present.   Session 2: Therapy session focused on NMR to LUE. Engaged in prone on elbows to increase weight bearing through LUE while reaching across body and in front to engage in Connect 4 game. Pt with cues to raise R elbow from mat table to facilitate increase in weight bearing. Completed supine to sit with min assist. Engaged in rolling of ring using adduction and abduction of shoulder and therapist providing assist to sustain grip to simulate driving a car. Pt then engaged in similar activity of rolling large therapy ball left<>right and completed with applying HOH assist to self. Pt demonstrated min bicep activation against gravity. Completed sit>supine and pt demonstrating increase in AROM at elbow with bicep activation with gravity eliminated. Pt demonstrates tricep>bicep. Pt motivated by this activity. At end of session pt returned to room and left eating lunch.   Therapy  Documentation Precautions:  Precautions Precautions: Fall Restrictions Weight Bearing Restrictions: No General:   Vital Signs: Therapy Vitals Temp: 98.2 F (36.8 C) Temp src: Oral Pulse Rate: 69 Resp: 18 BP: 155/61 mmHg Patient Position, if appropriate: Lying Oxygen Therapy SpO2: 100 % O2 Device: None (Room air) Pain: Pt reporting pain in L hip (arthritis) but did not provide numerical rating.   See FIM for current functional status  Therapy/Group: Individual Therapy  Daneil Dan 10/28/2013, 8:45 AM

## 2013-10-28 NOTE — Progress Notes (Signed)
Subjective/Complaints: Slept well no pain, "see how I can move my hand" Has urinary urgency. Also with constipation. No abdominal pain.  Review of Systems - Negative except L side weak  Objective: Vital Signs: Blood pressure 157/55, pulse 69, temperature 98.2 F (36.8 C), temperature source Oral, resp. rate 18, height 5\' 8"  (1.727 m), weight 79.6 kg (175 lb 7.8 oz), SpO2 100.00%. No results found. Results for orders placed during the hospital encounter of 10/26/13 (from the past 72 hour(s))  GLUCOSE, CAPILLARY     Status: Abnormal   Collection Time    10/26/13  4:49 PM      Result Value Range   Glucose-Capillary 130 (*) 70 - 99 mg/dL   Comment 1 Notify RN    GLUCOSE, CAPILLARY     Status: Abnormal   Collection Time    10/26/13  9:26 PM      Result Value Range   Glucose-Capillary 124 (*) 70 - 99 mg/dL   Comment 1 Notify RN    CBC WITH DIFFERENTIAL     Status: Abnormal   Collection Time    10/27/13  3:34 AM      Result Value Range   WBC 7.6  4.0 - 10.5 K/uL   RBC 3.89  3.87 - 5.11 MIL/uL   Hemoglobin 12.5  12.0 - 15.0 g/dL   HCT 44.0 (*) 34.7 - 42.5 %   MCV 92.0  78.0 - 100.0 fL   MCH 32.1  26.0 - 34.0 pg   MCHC 34.9  30.0 - 36.0 g/dL   RDW 95.6  38.7 - 56.4 %   Platelets 256  150 - 400 K/uL   Neutrophils Relative % 49  43 - 77 %   Neutro Abs 3.7  1.7 - 7.7 K/uL   Lymphocytes Relative 41  12 - 46 %   Lymphs Abs 3.1  0.7 - 4.0 K/uL   Monocytes Relative 8  3 - 12 %   Monocytes Absolute 0.6  0.1 - 1.0 K/uL   Eosinophils Relative 2  0 - 5 %   Eosinophils Absolute 0.2  0.0 - 0.7 K/uL   Basophils Relative 0  0 - 1 %   Basophils Absolute 0.0  0.0 - 0.1 K/uL  COMPREHENSIVE METABOLIC PANEL     Status: Abnormal   Collection Time    10/27/13  3:34 AM      Result Value Range   Sodium 142  135 - 145 mEq/L   Potassium 3.1 (*) 3.5 - 5.1 mEq/L   Chloride 107  96 - 112 mEq/L   CO2 24  19 - 32 mEq/L   Glucose, Bld 117 (*) 70 - 99 mg/dL   BUN 16  6 - 23 mg/dL   Creatinine, Ser  3.32  0.50 - 1.10 mg/dL   Calcium 8.6  8.4 - 95.1 mg/dL   Total Protein 6.2  6.0 - 8.3 g/dL   Albumin 3.2 (*) 3.5 - 5.2 g/dL   AST 10  0 - 37 U/L   ALT 6  0 - 35 U/L   Alkaline Phosphatase 67  39 - 117 U/L   Total Bilirubin 0.3  0.3 - 1.2 mg/dL   GFR calc non Af Amer 88 (*) >90 mL/min   GFR calc Af Amer >90  >90 mL/min   Comment: (NOTE)     The eGFR has been calculated using the CKD EPI equation.     This calculation has not been validated in all clinical situations.  eGFR's persistently <90 mL/min signify possible Chronic Kidney     Disease.  GLUCOSE, CAPILLARY     Status: Abnormal   Collection Time    10/27/13  7:30 AM      Result Value Range   Glucose-Capillary 110 (*) 70 - 99 mg/dL   Comment 1 Notify RN    GLUCOSE, CAPILLARY     Status: Abnormal   Collection Time    10/27/13 11:39 AM      Result Value Range   Glucose-Capillary 187 (*) 70 - 99 mg/dL   Comment 1 Notify RN    GLUCOSE, CAPILLARY     Status: Abnormal   Collection Time    10/27/13  4:41 PM      Result Value Range   Glucose-Capillary 138 (*) 70 - 99 mg/dL   Comment 1 Notify RN    GLUCOSE, CAPILLARY     Status: Abnormal   Collection Time    10/27/13  9:28 PM      Result Value Range   Glucose-Capillary 131 (*) 70 - 99 mg/dL   Comment 1 Notify RN    BASIC METABOLIC PANEL     Status: Abnormal   Collection Time    10/28/13  5:45 AM      Result Value Range   Sodium 139  135 - 145 mEq/L   Potassium 3.5  3.5 - 5.1 mEq/L   Chloride 104  96 - 112 mEq/L   CO2 25  19 - 32 mEq/L   Glucose, Bld 109 (*) 70 - 99 mg/dL   BUN 19  6 - 23 mg/dL   Creatinine, Ser 1.61  0.50 - 1.10 mg/dL   Calcium 8.8  8.4 - 09.6 mg/dL   GFR calc non Af Amer 89 (*) >90 mL/min   GFR calc Af Amer >90  >90 mL/min   Comment: (NOTE)     The eGFR has been calculated using the CKD EPI equation.     This calculation has not been validated in all clinical situations.     eGFR's persistently <90 mL/min signify possible Chronic Kidney      Disease.     HEENT: normal and throat clear Cardio: RRR and no murmur Resp: CTA B/L and unlabored GI: BS positive and non distended Extremity:  Pulses positive and No Edema Skin:   Intact Neuro: Alert/Oriented, Cranial Nerve II-XII normal, Normal Sensory, Abnormal Motor 3-/5 Left bi tri, delt and grip, 3-/5 L HF,KE,ADF and Abnormal FMC Ataxic/ dec FMC Musc/Skel:  Normal Gen NAD   Assessment/Plan: 1. Functional deficits secondary to Right pointine infarct with L Hemiparesis which require 3+ hours per day of interdisciplinary therapy in a comprehensive inpatient rehab setting. Physiatrist is providing close team supervision and 24 hour management of active medical problems listed below. Physiatrist and rehab team continue to assess barriers to discharge/monitor patient progress toward functional and medical goals. FIM: FIM - Bathing Bathing Steps Patient Completed: Chest;Front perineal area;Right lower leg (including foot);Left lower leg (including foot);Buttocks;Right upper leg;Left upper leg;Abdomen;Left Arm Bathing: 4: Min-Patient completes 8-9 39f 10 parts or 75+ percent  FIM - Upper Body Dressing/Undressing Upper body dressing/undressing: 0: Wears gown/pajamas-no public clothing FIM - Lower Body Dressing/Undressing Lower body dressing/undressing steps patient completed: Thread/unthread right underwear leg;Thread/unthread right pants leg;Thread/unthread left pants leg;Pull pants up/down;Don/Doff right sock Lower body dressing/undressing: 3: Mod-Patient completed 50-74% of tasks  FIM - Toileting Toileting steps completed by patient: Adjust clothing prior to toileting;Performs perineal hygiene;Adjust clothing after toileting Toileting Assistive Devices:  Grab bar or rail for support Toileting: 3: Mod-Patient completed 2 of 3 steps  FIM - Diplomatic Services operational officer Devices: Grab bars Toilet Transfers: 4-To toilet/BSC: Min A (steadying Pt. > 75%);4-From toilet/BSC:  Min A (steadying Pt. > 75%)  FIM - Bed/Chair Transfer Bed/Chair Transfer Assistive Devices: Arm rests Bed/Chair Transfer: 5: Supine > Sit: Supervision (verbal cues/safety issues);5: Sit > Supine: Supervision (verbal cues/safety issues);4: Chair or W/C > Bed: Min A (steadying Pt. > 75%);4: Bed > Chair or W/C: Min A (steadying Pt. > 75%)  FIM - Locomotion: Wheelchair Distance: 55 Locomotion: Wheelchair: 2: Travels 50 - 149 ft with minimal assistance (Pt.>75%) FIM - Locomotion: Ambulation Locomotion: Ambulation Assistive Devices: Other (comment) (R HHA) Ambulation/Gait Assistance: 4: Min assist Locomotion: Ambulation: 2: Travels 50 - 149 ft with minimal assistance (Pt.>75%)  Comprehension Comprehension Mode: Auditory Comprehension: 5-Follows basic conversation/direction: With no assist  Expression Expression Mode: Verbal Expression: 5-Expresses basic needs/ideas: With extra time/assistive device  Social Interaction Social Interaction: 5-Interacts appropriately 90% of the time - Needs monitoring or encouragement for participation or interaction.  Problem Solving Problem Solving: 5-Solves basic problems: With no assist  Memory Memory: 5-Recognizes or recalls 90% of the time/requires cueing < 10% of the time  Medical Problem List and Plan:  1. DVT Prophylaxis/Anticoagulation: Pharmaceutical: Lovenox  2. Pain Management: n/a  3. Mood: Motivated to get better. Will have LCSW follow for evaluation.  4. Neuropsych: This patient is capable of making decisions on her own behalf.  5 Diabetes: Will monitor with ac/hs checks--use SSI for tighter control. Continue metformin bid.  6. HTN: Will monitor with bid checks--avoid hypotension. Was started on Prinivil and HCTZ--continue and recheck lytes showed only hypoK on supplementation, recheck bmet in a.m. LOS (Days) 2 A FACE TO FACE EVALUATION WAS PERFORMED  KIRSTEINS,ANDREW E 10/28/2013, 7:34 AM

## 2013-10-28 NOTE — IPOC Note (Signed)
Overall Plan of Care [facility]) Patient Details Name: Laurie [name] MRN: [id number] DOB: 04-25-1946  Admitting Diagnosis: R CVA  Hospital Problems: Active Problems:   HYPERTENSION, UNCONTROLLED   CVA (cerebral infarction)   Arthralgia of hip     Functional Problem List: Nursing Medication Management;Perception;Sensory  PT Balance;Endurance;Motor;Safety  OT Balance;Behavior;Motor;Safety;Sensory;Vision  SLP Cognition;Linguistic  TR         Basic ADL's: OT Eating;Grooming;Bathing;Dressing;Toileting     Advanced  ADL's: OT Simple Meal Preparation;Light Housekeeping     Transfers: PT Bed Mobility;Bed to Chair;Car;Furniture;Floor  OT Toilet;Tub/Shower     Locomotion: PT Ambulation;Wheelchair Mobility;Stairs     Additional Impairments: OT Fuctional Use of Upper Extremity  SLP Communication;Social Cognition expression Problem Solving;Memory;Awareness  TR      Anticipated Outcomes Item Anticipated Outcome  Self Feeding Mod I  Swallowing      Basic self-care  Mod I  Toileting  Mod I   Bathroom Transfers Mod I  Bowel/Bladder  Remain continent of bowel and bladder  Transfers  mod I transfers  Locomotion  mod I gait and supervision stairs  Communication  Mod I  Cognition  Mod I  Pain  < 3  Safety/Judgment  Patient will adhere to safety plan   Therapy Plan: PT Intensity: Minimum of 1-2 x/day ,45 to 90 minutes PT Frequency: 5 out of 7 days PT Duration Estimated Length of Stay: 7-10 days OT Intensity: Minimum of 1-2 x/day, 45 to 90 minutes OT Frequency: 5 out of 7 days OT Duration/Estimated Length of Stay: 7-10 days SLP Intensity: Minumum of 1-2 x/day, 30 to 90 minutes SLP Frequency: 5 out of 7 days SLP Duration/Estimated Length of Stay: 10 days       Team Interventions: Nursing Interventions Patient/Family Education;Disease Management/Prevention;Medication Management  PT interventions Ambulation/gait training;Balance/vestibular training;Community  reintegration;Discharge planning;Neuromuscular re-education;Functional mobility training;DME/adaptive equipment instruction;Disease management/prevention;Pain management;Patient/family education;Psychosocial support;Splinting/orthotics;UE/LE Coordination activities;UE/LE Strength taining/ROM;Therapeutic Exercise;Therapeutic Activities;Stair training;Wheelchair propulsion/positioning  OT Interventions Balance/vestibular training;Community reintegration;Discharge planning;DME/adaptive equipment instruction;Functional electrical stimulation;Functional mobility training;Neuromuscular re-education;Patient/family education;Psychosocial support;Self Care/advanced ADL retraining;Therapeutic Activities;Therapeutic Exercise;UE/LE Strength taining/ROM;UE/LE Coordination activities  SLP Interventions Cognitive remediation/compensation;Cueing hierarchy;Functional tasks;Internal/external aids;Medication managment;Patient/family education;Therapeutic Activities  TR Interventions    SW/CM Interventions Discharge Planning;Psychosocial Support;Patient/Family Education    Team Discharge Planning: Destination: PT-Home ,OT- Home , SLP-Home Projected Follow-up: PT-Outpatient PT, OT-  Home health OT, SLP-None Projected Equipment Needs: PT-Cane, OT- Tub/shower bench;Tub/shower seat, SLP-None recommended by SLP Patient/family involved in discharge planning: PT- Patient,  OT-Patient, SLP-Patient  MD ELOS: 7-10 days Medical Rehab Prognosis:  Excellent Assessment: The patient has been admitted for CIR therapies. The team will be addressing, functional mobility, strength, stamina, balance, safety, adaptive techniques/equipment, self-care, bowel and bladder mgt, patient and caregiver education, cognition, communication, NMR. Goals have been set at Cedric Fishman, MD, [facility]      See Team Conference Notes for weekly updates to the plan of care

## 2013-10-28 NOTE — Progress Notes (Signed)
Speech Language Pathology Daily Session Note  Patient Details  Name: Laurie [name] MRN: [id number] Date of Birth: 03/31/1946  Today's Date: 10/28/2013 Time: 6213-0865 Time Calculation (min): 40 min  Short Term Goals: Week 1: SLP Short Term Goal 1 (Week 1): Patient will demonstrate anticipatory awareness with Supervision questioning and verbal cues. SLP Short Term Goal 2 (Week 1): Patient will solve complex, functional problems with Supervision questioning and verbal cues. SLP Short Term Goal 3 (Week 1): Patient will recall three memory compensatory strategies with Mod I. SLP Short Term Goal 4 (Week 1): Patient will utilize memory compensatory strategies during complex, functional tasks with Supervision verbal cues. SLP Short Term Goal 5 (Week 1): Patient will recall three speech intelligibility strategies with Mod I. SLP Short Term Goal 6 (Week 1): Patient will utilize speech intelligibility strategies during structured task with Supervision verbal cues.  Skilled Therapeutic Interventions: Skilled treatment session focused on addressing dysarthria and cognition goals.  SLP facilitated session with introduction to speech intelligibility strategies; patient required Min verbal cues to utilize a slow rate of speech with over articulation.  SLP also facilitated session with external memory aid to assist with recall of current medications and functions; prior to use of the aids patient required Total assist and with the aid she required Min verbal cues.  Recommend to continue with current plan of care.      FIM:  Comprehension Comprehension Mode: Auditory Comprehension: 5-Understands complex 90% of the time/Cues < 10% of the time Expression Expression Mode: Verbal Expression: 4-Expresses basic 75 - 89% of the time/requires cueing 10 - 24% of the time. Needs helper to occlude trach/needs to repeat words. Social Interaction Social Interaction: 5-Interacts appropriately 90% of the time - Needs  monitoring or encouragement for participation or interaction. Problem Solving Problem Solving: 5-Solves basic problems: With no assist Memory Memory: 5-Recognizes or recalls 90% of the time/requires cueing < 10% of the time  Pain Pain Assessment Pain Assessment: No/denies pain  Therapy/Group: Individual Therapy  Charlane Ferretti., CCC-SLP [phone]  Laurie [name] 10/28/2013, 2:30 PM

## 2013-10-28 NOTE — Progress Notes (Signed)
Patient information reviewed and entered into eRehab system by Rockney Grenz, RN, CRRN, PPS Coordinator.  Information including medical coding and functional independence measure will be reviewed and updated through discharge.     Per nursing patient was given "Data Collection Information Summary for Patients in Inpatient Rehabilitation Facilities with attached "Privacy Act Statement-Health Care Records" upon admission.  

## 2013-10-28 NOTE — Progress Notes (Signed)
Physical Therapy Session Note  Patient Details  Name: Laurie [name] MRN: [id number] Date of Birth: 1946-10-14  Today's Date: 10/28/2013 Time: 5284-1324 Time Calculation (min): 62 min  Short Term Goals: Week 1:  PT Short Term Goal 1 (Week 1): STGs=LTGs secondary to short ELOS  Skilled Therapeutic Interventions/Progress Updates:    Patient received sitting in wheelchair. Session focused on gait training and L LE NMR; see details below. Patient returned to room and left seated in wheelchair with all needs within reach.  Therapy Documentation Precautions:  Precautions Precautions: Fall Restrictions Weight Bearing Restrictions: No Pain: Pain Assessment Pain Assessment: 0-10 Pain Score: 5  Pain Type: Chronic pain Pain Location: Hip Pain Orientation: Left Pain Descriptors / Indicators: Aching;Sore Pain Onset: On-going Locomotion : Ambulation Ambulation: Yes Ambulation/Gait Assistance: 4: Min assist Ambulation Distance (Feet): 75 Feet Assistive device: None;Large base quad cane Ambulation/Gait Assistance Details: Verbal cues for gait pattern;Verbal cues for precautions/safety;Manual facilitation for weight shifting;Verbal cues for safe use of DME/AE;Visual cues for safe use of DME/AE Ambulation/Gait Assistance Details: Patient instructed in gait training 21' x2 in controlled environment without AD for first trial, with [name] for second trial, minA. Noted improved weight shifts and decreased L knee buckling with use of LBQC. Patient instructed in gait training x25' with R handrail prior to backwards walking. Patient continues to require verbal cues for increased L knee flexion and L toe clearance. Gait Gait: Yes Gait Pattern: Impaired Gait Pattern: Step-through pattern;Decreased step length - left;Decreased step length - right;Decreased stance time - left;Decreased stride length;Decreased hip/knee flexion - left;Decreased dorsiflexion - left;Narrow base of support;Left  circumduction High Level Ambulation High Level Ambulation: Backwards walking Backwards Walking: x25' with R handrail and minA, no overt LOB; emphasis on L lateral weight shift. Stairs / Additional Locomotion Stairs: No Corporate treasurer: No  Other Treatments: Treatments Therapeutic Activity: Patient performed x10 sit<>stand transfers from wheelchair without UE support to facilitate improved/increased anterior weight shift and increased B LE weight bearing; minA progressing to min guard for activity. Patient performed x12 tandem sit<>stand transfers from wheelchair with R LE positioned anteriorly to L LE to increased weight bearing through L LE, patient uses R UE to push up from wheelchair. Patient requires minA for activity. Neuromuscular Facilitation: Left;Lower Extremity;Forced use;Activity to increase motor control;Activity to increase grading;Activity to increase sustained activation;Activity to increase anterior-posterior weight shifting;Activity to increase lateral weight shifting (x20 stair taps with R LE (no UE support) to facilitate increased weight bearing and sustained activiation of L LE, minA overall; x20 step ups (no UE suport) with L LE to facilitate increased anterior and left lateral weight shifts, minA overall. Emphasis on slow controlled descent when stepping back and down with R LE to promote increased eccentric quad control on L LE)  See FIM for current functional status  Therapy/Group: Individual Therapy  Chipper Herb. Dung Salinger, PT, DPT 10/28/2013, 10:46 AM

## 2013-10-29 ENCOUNTER — Inpatient Hospital Stay (HOSPITAL_COMMUNITY): Payer: Medicare Other | Admitting: Physical Therapy

## 2013-10-29 ENCOUNTER — Inpatient Hospital Stay (HOSPITAL_COMMUNITY): Payer: Medicare Other | Admitting: Occupational Therapy

## 2013-10-29 ENCOUNTER — Inpatient Hospital Stay (HOSPITAL_COMMUNITY): Payer: Medicare Other | Admitting: Speech Pathology

## 2013-10-29 DIAGNOSIS — I10 Essential (primary) hypertension: Secondary | ICD-10-CM

## 2013-10-29 DIAGNOSIS — M25559 Pain in unspecified hip: Secondary | ICD-10-CM

## 2013-10-29 DIAGNOSIS — I5032 Chronic diastolic (congestive) heart failure: Secondary | ICD-10-CM

## 2013-10-29 LAB — GLUCOSE, CAPILLARY
Glucose-Capillary: 116 mg/dL — ABNORMAL HIGH (ref 70–99)
Glucose-Capillary: 121 mg/dL — ABNORMAL HIGH (ref 70–99)

## 2013-10-29 NOTE — Progress Notes (Signed)
Physical Therapy Session Note  Patient Details  Name: Laurie [name] MRN: [id number] Date of Birth: 09-09-1946  Today's Date: 10/29/2013 Time: 3664-4034 Time Calculation (min): 45 min  Short Term Goals: Week 1:  PT Short Term Goal 1 (Week 1): STGs=LTGs secondary to short ELOS  Skilled Therapeutic Interventions/Progress Updates:    Pt received seated EOB in pt room with family present. Pt reports fatigue and ongoing pain in L hip but is agreeable to therapy. RN notified of pain. Session focused on increasing pt stability/independence with gait and stair negotiation. Negotiation of 5 stairs with R hand rail requiring min guard due to pt fatigue. Gait x50' with min A, R HHA. Attempted gait with quad cane, per pt request, x40' requiring min guard for balance. Also attempted gait without assistive device 2x5' requiring min guard to min A due to single L knee buckle during final trial. Pt transported in w/c to pt room, where session ended. Therapist departed with pt seated in w/c with all need within reach.  Therapy Documentation Precautions:  Precautions Precautions: Fall Restrictions Weight Bearing Restrictions: No Vital Signs: Therapy Vitals Temp: 98.4 F (36.9 C) Temp src: Oral Pulse Rate: 71 Resp: 17 BP: 162/70 mmHg Patient Position, if appropriate: Lying Oxygen Therapy SpO2: 90 % O2 Device: None (Room air) Pain: Pain Assessment Pain Assessment: 0-10 Pain Score: 2  Pain Type: Chronic pain Pain Location: Hip Pain Orientation: Left Pain Descriptors / Indicators: Aching Pain Onset: Other (Comment) Patients Stated Pain Goal: 1 Pain Intervention(s): Medication (See eMAR) Multiple Pain Sites: No Locomotion : Ambulation Ambulation/Gait Assistance: 4: Min assist   See FIM for current functional status  Therapy/Group: Individual Therapy  Shermeka Rutt, Lorenda Ishihara 10/29/2013, 4:50 PM

## 2013-10-29 NOTE — Progress Notes (Signed)
Occupational Therapy Session Note  Patient Details  Name: Laurie [name] MRN: [id number] Date of Birth: 09-Apr-1946  Today's Date: 10/29/2013 Time: 1105-1200 Time Calculation (min): 55 min  Short Term Goals: Week 1:  OT Short Term Goal 1 (Week 1): STGs=LTGs secondary to short ELOS  Skilled Therapeutic Interventions/Progress Updates:  No complaints of pain Patient found supine in bed asleep. Patient initially stated she was too tired and didn't want to participate in OT. After some encouragement, patient willing and ambulated from bed -> walk-in shower for ADL retraining at shower level. Patient performed bathing in sit<>stand position on tub transfer bench in walk-in shower. Patient able to complete bathing with minimal assistance (assistance needed with bathing RUE). From here, patient ambulated out of shower-> edge of bed to donn hospital gown and underwear. Patient had no clean clothes to donn after shower, therefore donned hospital gown. Patient remained seated EOB at end of session with lunch tray and all needed items within reach. Patient took more than reasonable amount of time, but was able to complete tasks in a safe manner with supervision->steady assist provided from therapist. Patient is motivated to be as independent as possible. Noticed some short term memory deficits throughout session (example: repeating same stories to therapist).   Precautions:  Precautions Precautions: Fall Restrictions Weight Bearing Restrictions: No  See FIM for current functional status  Therapy/Group: Individual Therapy  Magenta Schmiesing 10/29/2013, 12:05 PM

## 2013-10-29 NOTE — Progress Notes (Signed)
WILBURTA MILBOURN is a 67 y.o. female [birth year] [id number]  Subjective: No new complaints; c/o L hip pain. No new problems. Slept OK.  Objective: Vital signs in last 24 hours: Temp:  [98.1 F (36.7 C)] 98.1 F (36.7 C) (11/08 0630) Pulse Rate:  [70-74] 74 (11/08 0630) Resp:  [18] 18 (11/08 0630) BP: (138-171)/(61-99) 138/68 mmHg (11/08 0630) SpO2:  [97 %-99 %] 97 % (11/08 0630) Weight change:  Last BM Date: 10/27/13  Intake/Output from previous day: 11/07 0701 - 11/08 0700 In: 1080 [P.O.:1080] Out: 1 [Urine:1] Last cbgs: CBG (last 3)   Recent Labs  10/28/13 1640 10/28/13 2059 10/29/13 0711  GLUCAP 115* 134* 106*     Physical Exam General: No apparent distress   Eating breakfast  HEENT: moist mucosa Lungs: Normal effort. Lungs clear to auscultation, no crackles or wheezes. Cardiovascular: Regular rate and rhythm, no edema Abdomen: S/NT/ND; BS(+) Musculoskeletal:  No change from before. L lat hip is a little tender Neurological: No new neurological deficits Wounds: N/A    Skin: clear Alert, cooperative   Lab Results: BMET    Component Value Date/Time   NA 139 10/28/2013 0545   K 3.5 10/28/2013 0545   CL 104 10/28/2013 0545   CO2 25 10/28/2013 0545   GLUCOSE 109* 10/28/2013 0545   BUN 19 10/28/2013 0545   CREATININE 0.72 10/28/2013 0545   CALCIUM 8.8 10/28/2013 0545   GFRNONAA 89* 10/28/2013 0545   GFRAA >90 10/28/2013 0545   CBC    Component Value Date/Time   WBC 7.6 10/27/2013 0334   RBC 3.89 10/27/2013 0334   HGB 12.5 10/27/2013 0334   HCT 35.8* 10/27/2013 0334   PLT 256 10/27/2013 0334   MCV 92.0 10/27/2013 0334   MCH 32.1 10/27/2013 0334   MCHC 34.9 10/27/2013 0334   RDW 12.6 10/27/2013 0334   LYMPHSABS 3.1 10/27/2013 0334   MONOABS 0.6 10/27/2013 0334   EOSABS 0.2 10/27/2013 0334   BASOSABS 0.0 10/27/2013 0334    Studies/Results: No results found.  Medications: I have reviewed the patient's current medications.  Assessment/Plan:  1. DVT  Prophylaxis/Anticoagulation: Pharmaceutical: Lovenox  2. Pain Management: n/a  3. Mood: Motivated to get better. Will have LCSW follow for evaluation.  4. Neuropsych: This patient is capable of making decisions on her own behalf.  5 Diabetes: Will monitor with ac/hs checks--use SSI for tighter control. Continue metformin bid.  6. HTN: Will monitor with bid checks--avoid hypotension. Was started on Prinivil and HCTZ--continue and recheck lytes showed only hypoK on supplementation, recheck bmet in a.m. 7. L lateral hip pain - ?bursitis. Pain meds. Will watch     Length of stay, days: 3  Sonda Primes , MD 10/29/2013, 8:06 AM

## 2013-10-29 NOTE — Progress Notes (Signed)
Physical Therapy Session Note  Patient Details  Name: Laurie [name] MRN: [id number] Date of Birth: 03-25-1946  Today's Date: 10/29/2013 Time: 0800-0900 Time Calculation (min): 60 min  Short Term Goals: Week 1:  PT Short Term Goal 1 (Week 1): STGs=LTGs secondary to short ELOS  Skilled Therapeutic Interventions/Progress Updates:    Pt received in semi-reclined in bed. Pt agreeable to therapy and reports having recently received pain medication to address 4/10 pain in L hip. Session focused on increasing pt stability/independence with gait, car transfers, and functional LLE activation/movement. See below for detailed description of NMR. Supine<>sit without rail, HOB flat requiring supervision and increased time. Bed<>w/cwith min A, R HHA.  Car transfers x2 with min A, R HHA. Verbal cueing provided during initial transfer to remind pt positioning prior to performing stand>sit; pt gave effective return demonstration during subsequent car transfer. Gait x27' with min A, R HHA followed by gait x25' with min guard (no HHA). Pt transported to room, where session ended. Therapist departed room with pt seated EOB with bed alarm on and all needs within reach.  Therapy Documentation Precautions:  Precautions Precautions: Fall Restrictions Weight Bearing Restrictions: No Pain: Pain Assessment Pain Score: 4  Pain Type: Chronic pain Pain Location: Hip Pain Orientation: Left Pain Descriptors / Indicators: Aching Pain Onset: On-going Patients Stated Pain Goal: 1 Pain Intervention(s): Ambulation/increased activity Multiple Pain Sites: No Locomotion : Ambulation Ambulation/Gait Assistance: 4: Min assist  Other Treatments:   Supine: LLE D2 flexion/extension rhythmic initiation x5 reps per direction; resisted 3x10 reps per direction. Manual cueing focus at L ankle dorsiflexors during D2 flexion; no contraction palpable. Pt able to actively contract and move L hip/knee through full flexion ROM (in  supine); required manual/verbal cueing for eccentric control during L hip/knee extension.  See FIM for current functional status  Therapy/Group: Individual Therapy  Hobble, Lorenda Ishihara 10/29/2013, 12:21 PM

## 2013-10-29 NOTE — Progress Notes (Signed)
Speech Language Pathology Daily Session Note  Patient Details  Name: Laurie [name] MRN: [id number] Date of Birth: 07/03/1946  Today's Date: 10/29/2013 Time: 0935-1010 Time Calculation (min): 35 min  Short Term Goals: Week 1: SLP Short Term Goal 1 (Week 1): Patient will demonstrate anticipatory awareness with Supervision questioning and verbal cues. SLP Short Term Goal 2 (Week 1): Patient will solve complex, functional problems with Supervision questioning and verbal cues. SLP Short Term Goal 3 (Week 1): Patient will recall three memory compensatory strategies with Mod I. SLP Short Term Goal 4 (Week 1): Patient will utilize memory compensatory strategies during complex, functional tasks with Supervision verbal cues. SLP Short Term Goal 5 (Week 1): Patient will recall three speech intelligibility strategies with Mod I. SLP Short Term Goal 6 (Week 1): Patient will utilize speech intelligibility strategies during structured task with Supervision verbal cues.  Skilled Therapeutic Interventions: Therapeutic intervention for dysarthria and cognitive impairment complete.  Patient required supervision/cues with completing functional problems.  She was able to recall 2 out 3 strategies to increase speech intelligibility.  In quiet, controlled environment, listener can understand patient; however, discussion regarding using compensatory strategies are important, especially in a noisy environment.  Continue with current treatment plan.     FIM:  Expression Expression Mode: Verbal Expression: 5-Expresses basic needs/ideas: With no assist Problem Solving Problem Solving: 5-Solves basic problems: With no assist Memory Memory: 4-Recognizes or recalls 75 - 89% of the time/requires cueing 10 - 24% of the time  Pain Pain Assessment Pain Assessment: No/denies pain Pain Score: 2  Pain Type: Chronic pain Pain Location: Hip Pain Orientation: Left Pain Descriptors / Indicators: Aching Pain Onset: Other  (Comment) Patients Stated Pain Goal: 1 Pain Intervention(s): Medication (See eMAR) Multiple Pain Sites: No  Therapy/Group: Individual Therapy  Lenny Pastel 10/29/2013, 5:10 PM

## 2013-10-30 ENCOUNTER — Inpatient Hospital Stay (HOSPITAL_COMMUNITY): Payer: Medicare Other | Admitting: Physical Therapy

## 2013-10-30 LAB — GLUCOSE, CAPILLARY: Glucose-Capillary: 111 mg/dL — ABNORMAL HIGH (ref 70–99)

## 2013-10-30 MED ORDER — LISINOPRIL 5 MG PO TABS
5.0000 mg | ORAL_TABLET | Freq: Two times a day (BID) | ORAL | Status: DC
  Administered 2013-10-30 – 2013-10-31 (×3): 5 mg via ORAL
  Filled 2013-10-30 (×6): qty 1

## 2013-10-30 NOTE — Progress Notes (Signed)
Laurie [name] is a 67 y.o. female [birth year] [id number]  Subjective: No new complaints; c/o L hip pain. No new problems. Slept OK.  Objective: Vital signs in last 24 hours: Temp:  [98.4 F (36.9 C)] 98.4 F (36.9 C) (11/08 1500) Pulse Rate:  [71-72] 72 (11/08 2041) Resp:  [17-18] 18 (11/08 2041) BP: (155-162)/(70-72) 155/72 mmHg (11/08 2041) SpO2:  [90 %-98 %] 98 % (11/08 2041) Weight change:  Last BM Date: 10/27/13  Intake/Output from previous day: 11/08 0701 - 11/09 0700 In: 1440 [P.O.:1440] Out: -  Last cbgs: CBG (last 3)   Recent Labs  10/29/13 1623 10/29/13 2125 10/30/13 0707  GLUCAP 139* 121* 103*     Physical Exam General: No apparent distress   Eating breakfast  HEENT: moist mucosa Lungs: Normal effort. Lungs clear to auscultation, no crackles or wheezes. Cardiovascular: Regular rate and rhythm, no edema Abdomen: S/NT/ND; BS(+) Musculoskeletal:  No change from before. L lat hip is a little tender Neurological: No new neurological deficits Wounds: N/A    Skin: clear Alert, cooperative   Lab Results: BMET    Component Value Date/Time   NA 139 10/28/2013 0545   K 3.5 10/28/2013 0545   CL 104 10/28/2013 0545   CO2 25 10/28/2013 0545   GLUCOSE 109* 10/28/2013 0545   BUN 19 10/28/2013 0545   CREATININE 0.72 10/28/2013 0545   CALCIUM 8.8 10/28/2013 0545   GFRNONAA 89* 10/28/2013 0545   GFRAA >90 10/28/2013 0545   CBC    Component Value Date/Time   WBC 7.6 10/27/2013 0334   RBC 3.89 10/27/2013 0334   HGB 12.5 10/27/2013 0334   HCT 35.8* 10/27/2013 0334   PLT 256 10/27/2013 0334   MCV 92.0 10/27/2013 0334   MCH 32.1 10/27/2013 0334   MCHC 34.9 10/27/2013 0334   RDW 12.6 10/27/2013 0334   LYMPHSABS 3.1 10/27/2013 0334   MONOABS 0.6 10/27/2013 0334   EOSABS 0.2 10/27/2013 0334   BASOSABS 0.0 10/27/2013 0334    Studies/Results: No results found.  Medications: I have reviewed the patient's current medications.  Assessment/Plan:  1. DVT  Prophylaxis/Anticoagulation: Pharmaceutical: Lovenox  2. Pain Management: n/a  3. Mood: Motivated to get better. Will have LCSW follow for evaluation.  4. Neuropsych: This patient is capable of making decisions on her own behalf.  5 Diabetes: Will monitor with ac/hs checks--use SSI for tighter control. Continue metformin bid.  6. HTN: Will monitor with bid checks--avoid hypotension. Was started on Prinivil - increase to BID - and HCTZ--continue and recheck lytes showed only hypoK on supplementation, recheck bmet in a.m. 7. L lateral hip pain - ?bursitis. Pain meds. Better. Will watch     Length of stay, days: 4  Sonda Primes , MD 10/30/2013, 7:56 AM

## 2013-10-30 NOTE — Progress Notes (Signed)
Physical Therapy Note  Patient Details  Name: SHAIMA SARDINAS MRN: [id number] Date of Birth: 12-22-1946 Today's Date: 10/30/2013  1030-1115 (45 minutes) individual Pain: 8/10 pain left hip / pain meds given/ nursing aware Focus of treatment: neuro re-ed left LE; gait training; therapeutic exercise focused on LT LE strengthening/control/ activity tolerance Treatment: Pt up in wc upon arrival; transfers stand/turn [facility] close SBA; Neuro re-ed x 20 in supine Left LE- heel slides, hip abduction, manually resisted hip/knee extension; passive stretch left heel cords, AA ankle pumps ; gait 80 feet BBQC min to close SBA; Nustep Level 4 X 10 minutes LEs only; returned to room with all needs within reach.    Renna Kilmer,JIM 10/30/2013, 11:06 AM

## 2013-10-31 ENCOUNTER — Inpatient Hospital Stay (HOSPITAL_COMMUNITY): Payer: Medicare Other | Admitting: *Deleted

## 2013-10-31 ENCOUNTER — Inpatient Hospital Stay (HOSPITAL_COMMUNITY): Payer: Medicare Other

## 2013-10-31 ENCOUNTER — Inpatient Hospital Stay (HOSPITAL_COMMUNITY): Payer: Medicare Other | Admitting: Speech Pathology

## 2013-10-31 LAB — GLUCOSE, CAPILLARY
Glucose-Capillary: 109 mg/dL — ABNORMAL HIGH (ref 70–99)
Glucose-Capillary: 132 mg/dL — ABNORMAL HIGH (ref 70–99)

## 2013-10-31 MED ORDER — LIDOCAINE HCL (PF) 1 % IJ SOLN
5.0000 mL | Freq: Once | INTRAMUSCULAR | Status: AC
  Administered 2013-10-31: 5 mL
  Filled 2013-10-31 (×3): qty 5

## 2013-10-31 MED ORDER — BETAMETHASONE SOD PHOS & ACET 6 (3-3) MG/ML IJ SUSP
6.0000 mg | Freq: Once | INTRAMUSCULAR | Status: AC
  Administered 2013-10-31: 6 mg via INTRA_ARTICULAR
  Filled 2013-10-31 (×4): qty 1

## 2013-10-31 NOTE — Progress Notes (Addendum)
Subjective/Complaints: L hip pain.  Increased with sleeping on the L side No trauma Xrays 2010- Mild L hip OA, Moderate R hip OA  Review of Systems - Negative except L side weak  Objective: Vital Signs: Blood pressure 131/73, pulse 67, temperature 98.2 F (36.8 C), temperature source Oral, resp. rate 18, height 5\' 8"  (1.727 m), weight 79.6 kg (175 lb 7.8 oz), SpO2 99.00%. No results found. Results for orders placed during the hospital encounter of 10/26/13 (from the past 72 hour(s))  GLUCOSE, CAPILLARY     Status: Abnormal   Collection Time    10/28/13 11:31 AM      Result Value Range   Glucose-Capillary 109 (*) 70 - 99 mg/dL  GLUCOSE, CAPILLARY     Status: Abnormal   Collection Time    10/28/13  4:40 PM      Result Value Range   Glucose-Capillary 115 (*) 70 - 99 mg/dL  GLUCOSE, CAPILLARY     Status: Abnormal   Collection Time    10/28/13  8:59 PM      Result Value Range   Glucose-Capillary 134 (*) 70 - 99 mg/dL  GLUCOSE, CAPILLARY     Status: Abnormal   Collection Time    10/29/13  7:11 AM      Result Value Range   Glucose-Capillary 106 (*) 70 - 99 mg/dL   Comment 1 Notify RN    GLUCOSE, CAPILLARY     Status: Abnormal   Collection Time    10/29/13 11:41 AM      Result Value Range   Glucose-Capillary 116 (*) 70 - 99 mg/dL   Comment 1 Notify RN    GLUCOSE, CAPILLARY     Status: Abnormal   Collection Time    10/29/13  4:23 PM      Result Value Range   Glucose-Capillary 139 (*) 70 - 99 mg/dL   Comment 1 Notify RN    GLUCOSE, CAPILLARY     Status: Abnormal   Collection Time    10/29/13  9:25 PM      Result Value Range   Glucose-Capillary 121 (*) 70 - 99 mg/dL  GLUCOSE, CAPILLARY     Status: Abnormal   Collection Time    10/30/13  7:07 AM      Result Value Range   Glucose-Capillary 103 (*) 70 - 99 mg/dL   Comment 1 Notify RN    GLUCOSE, CAPILLARY     Status: None   Collection Time    10/30/13 11:43 AM      Result Value Range   Glucose-Capillary 98  70 - 99  mg/dL   Comment 1 Notify RN    GLUCOSE, CAPILLARY     Status: Abnormal   Collection Time    10/30/13  4:24 PM      Result Value Range   Glucose-Capillary 150 (*) 70 - 99 mg/dL   Comment 1 Notify RN    GLUCOSE, CAPILLARY     Status: Abnormal   Collection Time    10/30/13  9:07 PM      Result Value Range   Glucose-Capillary 111 (*) 70 - 99 mg/dL   Comment 1 Notify RN    GLUCOSE, CAPILLARY     Status: Abnormal   Collection Time    10/31/13  7:16 AM      Result Value Range   Glucose-Capillary 109 (*) 70 - 99 mg/dL   Comment 1 Notify RN       HEENT: normal and throat  clear Cardio: RRR and no murmur Resp: CTA B/L and unlabored GI: BS positive and non distended Extremity:  Pulses positive and No Edema, L hip tenderness lateral Skin:   Intact Neuro: Alert/Oriented, Cranial Nerve II-XII normal, Normal Sensory, Abnormal Motor 3-/5 Left bi tri, delt and grip, 3-/5 L HF,KE,ADF and Abnormal FMC Ataxic/ dec FMC Musc/Skel:  Normal Gen NAD   Assessment/Plan: 1. Functional deficits secondary to Right pointine infarct with L Hemiparesis which require 3+ hours per day of interdisciplinary therapy in a comprehensive inpatient rehab setting. Physiatrist is providing close team supervision and 24 hour management of active medical problems listed below. Physiatrist and rehab team continue to assess barriers to discharge/monitor patient progress toward functional and medical goals. FIM: FIM - Bathing Bathing Steps Patient Completed: Chest;Front perineal area;Right lower leg (including foot);Left lower leg (including foot);Buttocks;Right upper leg;Left upper leg;Abdomen;Left Arm Bathing: 4: Min-Patient completes 8-9 47f 10 parts or 75+ percent  FIM - Upper Body Dressing/Undressing Upper body dressing/undressing steps patient completed: Thread/unthread right sleeve of pullover shirt/dresss;Pull shirt over trunk Upper body dressing/undressing: 0: Wears gown/pajamas-no public clothing FIM - Lower  Body Dressing/Undressing Lower body dressing/undressing steps patient completed: Thread/unthread right underwear leg;Thread/unthread left underwear leg;Pull underwear up/down;Don/Doff left sock;Don/Doff right sock Lower body dressing/undressing: 5: Supervision: Safety issues/verbal cues  FIM - Toileting Toileting steps completed by patient: Adjust clothing prior to toileting;Performs perineal hygiene;Adjust clothing after toileting Toileting Assistive Devices: Grab bar or rail for support Toileting: 3: Mod-Patient completed 2 of 3 steps  FIM - Diplomatic Services operational officer Devices: Grab bars Toilet Transfers: 4-To toilet/BSC: Min A (steadying Pt. > 75%);4-From toilet/BSC: Min A (steadying Pt. > 75%)  FIM - Bed/Chair Transfer Bed/Chair Transfer Assistive Devices: Bed rails;Arm rests (R HHA) Bed/Chair Transfer: 4: Bed > Chair or W/C: Min A (steadying Pt. > 75%);4: Chair or W/C > Bed: Min A (steadying Pt. > 75%)  FIM - Locomotion: Wheelchair Distance: 55 Locomotion: Wheelchair: 1: Total Assistance/staff pushes wheelchair (Pt<25%) FIM - Locomotion: Ambulation Locomotion: Ambulation Assistive Devices: Other (comment) (R HHA) Ambulation/Gait Assistance: 4: Min assist Locomotion: Ambulation: 2: Travels 50 - 149 ft with minimal assistance (Pt.>75%)  Comprehension Comprehension Mode: Auditory Comprehension: 5-Understands complex 90% of the time/Cues < 10% of the time  Expression Expression Mode: Verbal Expression: 5-Expresses basic needs/ideas: With no assist  Social Interaction Social Interaction: 5-Interacts appropriately 90% of the time - Needs monitoring or encouragement for participation or interaction.  Problem Solving Problem Solving: 5-Solves basic problems: With no assist  Memory Memory: 4-Recognizes or recalls 75 - 89% of the time/requires cueing 10 - 24% of the time  Medical Problem List and Plan:  1. DVT Prophylaxis/Anticoagulation: Pharmaceutical: Lovenox   2. Pain Management: L Hip pain, troch bursitis-Injection documented below 3. Mood: Motivated to get better. Will have LCSW follow for evaluation.  4. Neuropsych: This patient is capable of making decisions on her own behalf.  5 Diabetes: Will monitor with ac/hs checks--use SSI for tighter control. Continue metformin bid. CBG may increase s/p clestone inj 6. HTN: Will monitor with bid checks--avoid hypotension. Was started on Prinivil and HCTZ--continue and recheck lytes showed only hypoK on supplementation, recheck bmet in a.m.    Left Trochanteric bursa injection  without ultrasound guidance  Indication Left Trochanteric bursitis. Exam has tenderness over the greater trochanter of the hip. Pain has not responded to conservative care such as exercise therapy and oral medications. Pain interferes with sleep or with mobility Informed consent was obtained after describing risks and  benefits of the procedure with the patient these include bleeding bruising and infection. Patient has signed written consent form. Patient placed in a lateral decubitus position with the affected hip superior. Point of maximal pain was palpated marked and prepped with Betadine and entered with a needle to bone contact. Needle slightly withdrawn then 6mg  of betamethasone with 4 cc 1% lidocaine were injected. Patient tolerated procedure well. Post procedure instructions given.  LOS (Days) 5 A FACE TO FACE EVALUATION WAS PERFORMED  Laurie [name] E 10/31/2013, 7:46 AM

## 2013-10-31 NOTE — Progress Notes (Signed)
Speech Language Pathology Daily Session Note  Patient Details  Name: TACOYA ALTIZER MRN: [id number] Date of Birth: December 06, 1946  Today's Date: 10/31/2013 Time: 4098-1191 Time Calculation (min): 28 min  Short Term Goals: Week 1: SLP Short Term Goal 1 (Week 1): Patient will demonstrate anticipatory awareness with Supervision questioning and verbal cues. SLP Short Term Goal 2 (Week 1): Patient will solve complex, functional problems with Supervision questioning and verbal cues. SLP Short Term Goal 3 (Week 1): Patient will recall three memory compensatory strategies with Mod I. SLP Short Term Goal 4 (Week 1): Patient will utilize memory compensatory strategies during complex, functional tasks with Supervision verbal cues. SLP Short Term Goal 5 (Week 1): Patient will recall three speech intelligibility strategies with Mod I. SLP Short Term Goal 6 (Week 1): Patient will utilize speech intelligibility strategies during structured task with Supervision verbal cues.  Skilled Therapeutic Interventions: Skilled treatment session focused on addressing speech and cognition goals.  Patient recalled two intelligibility strategies with Mod I and the third with Min verbal cues.  Patient required Min verbal cues to utilize speech intelligibility strategies during conversation.  Additionally, SLP facilitated session with discussion regarding memory compensatory strategies and aids; patient was initially skeptical to utilize some strategies, although SLP explained their importance and she verbalized she understood and her willingness to utilize them.  SLP will facilitate patient's utilization of strategies in upcoming sessions.   FIM:  Comprehension Comprehension Mode: Auditory Comprehension: 5-Understands complex 90% of the time/Cues < 10% of the time Expression Expression Mode: Verbal Expression: 4-Expresses basic 75 - 89% of the time/requires cueing 10 - 24% of the time. Needs helper to occlude trach/needs  to repeat words. Social Interaction Social Interaction: 5-Interacts appropriately 90% of the time - Needs monitoring or encouragement for participation or interaction. Problem Solving Problem Solving: 5-Solves basic problems: With no assist Memory Memory: 5-Recognizes or recalls 90% of the time/requires cueing < 10% of the time  Pain Pain Assessment Pain Assessment: 0-10 Pain Score: 5  Pain Type: Chronic pain Pain Location: Hip Pain Orientation: Left Pain Descriptors / Indicators: Aching;Sore Pain Onset: On-going Pain Intervention(s):  (pre-medicated per patient) Multiple Pain Sites: No  Therapy/Group: Individual Therapy  Levora Angel 10/31/2013, 12:18 PM

## 2013-10-31 NOTE — Progress Notes (Signed)
Occupational Therapy Session Note  Patient Details  Name: Laurie [name] MRN: [id number] Date of Birth: 1946-11-21  Today's Date: 10/31/2013 Time: 1030-1130 and 1330-1400 Time Calculation (min): 60 min and 30 min   Short Term Goals: Week 1:  OT Short Term Goal 1 (Week 1): STGs=LTGs secondary to short ELOS  Skilled Therapeutic Interventions/Progress Updates:    Session 1: Pt seen for ADL retraining with focus on dynamic standing balance, functional transfers, safety awareness, and hemi dressing techniques. Pt received sitting in w/c. Ambulated to walk-in shower with [facility] and steadying assist. Completed bathing with min assist using HOH to wash RUE with LUE. Completed dressing in w/c with more then reasonable amount of time and min verbal cues for carryover of hemi dressing techniques. Pt left sitting in w/c with all needs in reach.   Session 2: Therapy session focused on functional transfers and NMR to LUE. Pt received sitting in w/c. Completed tub transfer and toilet transfers by ambulating from ADL apartment into bathroom with steadying assist when using [place]. Pt completed sit<>stand from toilet with supervision and supervision for toilet task. Pt stood at counter while weightbearing through LUE and reaching into overhead cabinets. Pt then wiped tables with focus on shoulder flexion/extension and abd/add without compensation with trunk and encouraged to apply pressure when moving wash cloth to wipe tables. Pt required min assist to complete full ROM during these activities. Pt returned to room and left with all items in reach.   Therapy Documentation Precautions:  Precautions Precautions: Fall Restrictions Weight Bearing Restrictions: No General: General Missed Time Reason: Other (comment) (Patient receiving cortizone injection) Vital Signs:   Pain: No c/o pain during therapy sessions.   See FIM for current functional status  Therapy/Group: Individual Therapy  Daneil Dan 10/31/2013, 11:40 AM

## 2013-10-31 NOTE — Progress Notes (Signed)
Physical Therapy Session Note  Patient Details  Name: ELLICE [name] MRN: [id number] Date of Birth: Jun 13, 1946  Today's Date: 10/31/2013 Time: 4098-1191 and 1130-1200 Time Calculation (min): 54 min and 30 min  Short Term Goals: Week 1:  PT Short Term Goal 1 (Week 1): STGs=LTGs secondary to short ELOS  Skilled Therapeutic Interventions/Progress Updates:    AM Session #1: Patient received sitting EOB, MD present for cortizone injection in L hip. Session focused on functional transfers, gait training, and stair negotiation; see details below. Patient reports having a "very large" step onto porch to access home. Simulated this by stacking step on top of curb with stairs to the R to use handrail to simulate very large step up. Step was 9.5 inches high and patient reports it "seems about the same as my step onto my porch". Patient returned to room and left seated in wheelchair with all needs within reach.  AM Session #2: Patient received sitting in wheelchair. Session focused on gait training and dynamic standing balance with use of Wii dancing. L DF ace wrap donned for gait training. Patient instructed in gait training 95'x1 in controlled environment with SPC and min guard. Patient demonstrates improved gait speed, more equal step length, and improved fluidity of gait. Patient also reports she feels more stable with SPC vs. LBQC. Patient performed dancing with the Wii x3 songs with seated rest breaks in between. Patient demonstrates lateral and anterior/psoterior weight shifting, forward/retro stepping, side stepping, marching, mini squats, etc. During dancing. Patient requires minA overall for activity with 3 instances of modA secondary to posterior LOB. Patient returned to room and left seated in wheelchair with all needs within reach, set up for lunch.  Therapy Documentation Precautions:  Precautions Precautions: Fall Restrictions Weight Bearing Restrictions: No General: Amount of Missed PT  Time (min): 6 Minutes Missed Time Reason: Other (comment) (Patient receiving cortizone injection) Pain: Pain Assessment Pain Assessment: 0-10 Pain Score: 6  Pain Type: Chronic pain Pain Location: Hip Pain Orientation: Left Pain Descriptors / Indicators: Aching;Sore Pain Onset: On-going Locomotion : Ambulation Ambulation: Yes Ambulation/Gait Assistance: 5: Supervision;4: Min guard Ambulation Distance (Feet): 95 Feet Assistive device: Large base quad cane Ambulation/Gait Assistance Details: Verbal cues for gait pattern;Verbal cues for precautions/safety;Manual facilitation for weight shifting;Verbal cues for safe use of DME/AE;Tactile cues for posture Ambulation/Gait Assistance Details: L DF ace wrap donned for gait training. Patient instructed in gait training 95' x1 and 115' x1 in controlled environment with [facility] and min guard progressing to close supervision. Gait Gait: Yes Gait Pattern: Impaired Gait Pattern: Step-through pattern;Decreased step length - left;Decreased step length - right;Decreased stance time - left;Decreased stride length;Decreased hip/knee flexion - left;Narrow base of support;Left circumduction Stairs / Additional Locomotion Stairs: Yes Stairs Assistance: 4: Min assist;3: Mod assist Stairs Assistance Details: Verbal cues for gait pattern;Verbal cues for precautions/safety;Verbal cues for sequencing;Verbal cues for technique;Manual facilitation for weight shifting Stairs Assistance Details (indicate cue type and reason): L DF ace wrap donned for stair negotiation. Patient ascended 1 step x2 trials forwards (descended first time side ways, descended second trial backwards) with R handrail. Patient requires modA descending sideways secondary to L LE remaining on step and having to control descent, L LE with decreased L quad control. Patient requires minA when descending backwards secondary to using R LE on top step to control descent. Stair Management Technique: One  rail Right;Step to pattern;Sideways;Backwards;Forwards Number of Stairs: 2 Height of Stairs: 9.5 Wheelchair Mobility Wheelchair Mobility: Yes Wheelchair Assistance: 4: Scientist, research (physical sciences)  Assistance Details: Verbal cues for precautions/safety;Verbal cues for sequencing;Verbal cues for technique Wheelchair Propulsion: Right upper extremity;Right lower extremity Wheelchair Parts Management: Needs assistance Distance: 115   See FIM for current functional status  Therapy/Group: Individual Therapy  Chipper Herb. Angelmarie Ponzo, PT, DPT 10/31/2013, 10:28 AM

## 2013-10-31 NOTE — Progress Notes (Signed)
The skilled treatment note has been reviewed and SLP is in agreement. Eugena Rhue, M.A., CCC-SLP [phone]  

## 2013-11-01 ENCOUNTER — Inpatient Hospital Stay (HOSPITAL_COMMUNITY): Payer: Medicare Other

## 2013-11-01 ENCOUNTER — Inpatient Hospital Stay (HOSPITAL_COMMUNITY): Payer: Medicare Other | Admitting: *Deleted

## 2013-11-01 ENCOUNTER — Inpatient Hospital Stay (HOSPITAL_COMMUNITY): Payer: Medicare Other | Admitting: Speech Pathology

## 2013-11-01 DIAGNOSIS — M76899 Other specified enthesopathies of unspecified lower limb, excluding foot: Secondary | ICD-10-CM

## 2013-11-01 DIAGNOSIS — E1165 Type 2 diabetes mellitus with hyperglycemia: Secondary | ICD-10-CM

## 2013-11-01 DIAGNOSIS — G8110 Spastic hemiplegia affecting unspecified side: Secondary | ICD-10-CM

## 2013-11-01 DIAGNOSIS — I10 Essential (primary) hypertension: Secondary | ICD-10-CM

## 2013-11-01 DIAGNOSIS — I6330 Cerebral infarction due to thrombosis of unspecified cerebral artery: Secondary | ICD-10-CM

## 2013-11-01 LAB — GLUCOSE, CAPILLARY
Glucose-Capillary: 106 mg/dL — ABNORMAL HIGH (ref 70–99)
Glucose-Capillary: 154 mg/dL — ABNORMAL HIGH (ref 70–99)
Glucose-Capillary: 105 mg/dL — ABNORMAL HIGH (ref 70–99)

## 2013-11-01 MED ORDER — LISINOPRIL 10 MG PO TABS
10.0000 mg | ORAL_TABLET | Freq: Two times a day (BID) | ORAL | Status: DC
  Administered 2013-11-01 – 2013-11-05 (×9): 10 mg via ORAL
  Filled 2013-11-01 (×11): qty 1

## 2013-11-01 NOTE — Progress Notes (Signed)
Occupational Therapy Session Note  Patient Details  Name: Laurie [name] MRN: [id number] Date of Birth: [birth year]  Today's Date: 11/01/2013 Time: [phone] and [phone] Time Calculation (min): 58 min and 45 min  Short Term Goals: Week 1:  OT Short Term Goal 1 (Week 1): STGs=LTGs secondary to short ELOS  Skilled Therapeutic Interventions/Progress Updates:  Pt seen for ADL retraining with focus on ambulating to obtain clothing, bathing, and LB dressing. Pt in bed upon arrival. Pt required steady assist to ambulate with [place] with steady assist from bed to obtain clothing. Educated patient on using LUE to hold clothes close to body while ambulating with [place]. Therapist wheeled Pt to ADL apartment where she ambulated with [place] with steady assist from apartment to bathroom and completed tub bench transfer with supervision. Pt completed bathing with supervision, washing her buttocks using lateral leans due to feet slipping in the tub. Pt donned socks sitting on edge of tub bench, Sit > stand with steady assist to don underwear. Pt ambulated from tub bench to a shower chair in room with steadying assist. Pt donned shirt and required steady assist to don pants. Therapist wheeled Pt back to room to complete grooming. Pt ambulated from w/c to sink with steady assist. Pt brushed her hair while weight bearing through LUE. Pt left in w/c with all needs in reach.   2.) Pt seen 1:1 OT with focus on Neuro-Re Ed with RUE. Pt in w/c upon arrival. In gym Pt ambulated  with [facility] from w/c to table and placed RLE on 4 inch block to promote weight bearing through Left LE. Therapist engaged Pt in activity with RUE crossing midline while weight bearing through LUE. Pt also completed towel glides with VC and HOH assistance to get to full ROM in flexion/extension and abduction/adduction.Pt displayed some thumb movement when asked to ball up wash cloth. Pt often would hike shoulder as compensatory movement to move wash cloth and  twist at the trunk. In supine this was eliminated and Pt able to complete elbow flexion and extension in this gravity eliminated position.Therapist performed PROM to wrist. Pt left in room in w/c with all needs in reach.  Therapy Documentation Precautions:  Precautions Precautions: Fall Restrictions Weight Bearing Restrictions: No General:   Vital Signs: Therapy Vitals Temp: 98.1 F (36.7 C) Temp src: Oral Pulse Rate: 72 Resp: 18 BP: 172/72 mmHg Patient Position, if appropriate: Lying Oxygen Therapy SpO2: 98 %  See FIM for current functional status  Therapy/Group: Individual Therapy  Donna Christen 11/01/2013, 9:34 AM

## 2013-11-01 NOTE — Progress Notes (Signed)
Speech Language Pathology Daily Session Note  Patient Details  Name: Laurie [name] MRN: [id number] Date of Birth: 15-Jun-1946  Today's Date: 11/01/2013 Time: 9629-5284 Time Calculation (min): 30 min  Short Term Goals: Week 1: SLP Short Term Goal 1 (Week 1): Patient will demonstrate anticipatory awareness with Supervision questioning and verbal cues. SLP Short Term Goal 2 (Week 1): Patient will solve complex, functional problems with Supervision questioning and verbal cues. SLP Short Term Goal 3 (Week 1): Patient will recall three memory compensatory strategies with Mod I. SLP Short Term Goal 4 (Week 1): Patient will utilize memory compensatory strategies during complex, functional tasks with Supervision verbal cues. SLP Short Term Goal 5 (Week 1): Patient will recall three speech intelligibility strategies with Mod I. SLP Short Term Goal 6 (Week 1): Patient will utilize speech intelligibility strategies during structured task with Supervision verbal cues.  Skilled Therapeutic Interventions: Treatment focused on speech and cognitive goals. SLP facilitated session with medication management task with utilization of medication list as an external memory aid. Pt filled out pill box using external aid with Supervision level verbal cues for accuracy. Pt utilized speech intelligibility strategies with Mod I, and speech was 100% intelligible at the conversational level as subjetively judged by this clinician. Continue plan of care.   FIM:  Comprehension Comprehension Mode: Auditory Comprehension: 5-Understands complex 90% of the time/Cues < 10% of the time Expression Expression Mode: Verbal Expression: 5-Expresses basic needs/ideas: With no assist Social Interaction Social Interaction: 5-Interacts appropriately 90% of the time - Needs monitoring or encouragement for participation or interaction. Problem Solving Problem Solving: 5-Solves basic problems: With no assist Memory Memory:  5-Recognizes or recalls 90% of the time/requires cueing < 10% of the time  Pain Pain Assessment Pain Assessment: No/denies pain Pain Score: 0-No pain  Therapy/Group: Individual Therapy  Maxcine Ham 11/01/2013, 1:43 PM  Maxcine Ham, M.A. CCC-SLP [phone]

## 2013-11-01 NOTE — Progress Notes (Signed)
Treatment note has been reviewed and this clinician agrees with the information provided.

## 2013-11-01 NOTE — Progress Notes (Signed)
Physical Therapy Session Note  Patient Details  Name: Laurie [name] MRN: [id number] Date of Birth: 1946/04/03  Today's Date: 11/01/2013 Time: 0930-1030 Time Calculation (min): 60 min  Short Term Goals: Week 1:  PT Short Term Goal 1 (Week 1): STGs=LTGs secondary to short ELOS  Skilled Therapeutic Interventions/Progress Updates:    Patient received sitting in wheelchair. Session focused on L LE NMR via wheelchair mobility with B LEs and gait training with and without L blue rocker toe off AFO. Orthotist present to observe gait pattern with and without AFO and in agreement that patient would benefit from anterior tibial toe off AFO. Initially, began gait training without use of AFO and with use of SPC. Patient demonstrating significantly decreased R step length and decreased stance time on L, also 2 anterior LOB secondary to poor L foot clearance. Second and third trial with use of L blue rocker toe off AFO and LBQC and patient demonstrates increased R step length, increased stance time on L, and no major LOB. Patient returned to room and left sitting in wheelchair with all needs within reach.  Therapy Documentation Precautions:  Precautions Precautions: Fall Restrictions Weight Bearing Restrictions: No Pain: Pain Assessment Pain Assessment: No/denies pain Pain Score: 0-No pain Locomotion : Ambulation Ambulation: Yes Ambulation/Gait Assistance: 4: Min assist Ambulation Distance (Feet): 131 Feet, 70', 55' Assistive device: Large base quad cane;Straight cane Ambulation/Gait Assistance Details: Verbal cues for gait pattern;Verbal cues for precautions/safety;Manual facilitation for weight shifting;Verbal cues for safe use of DME/AE;Tactile cues for posture Ambulation/Gait Assistance Details: Patient instructed in gait training 70'x1 in controlled environment with SPC and min guard-minA secondary to 2 anterior LOB due to poor L foot clearance. L blue rocker/toe off AFO donned for second  bout of gait training. Patient instructed in gait training 131' x1 and 55' x1 in controlled environment with min guard to minA. Patient with improved L foot clearance and equalized step/stride length. Gait Gait: Yes Gait Pattern: Impaired Gait Pattern: Step-through pattern;Decreased step length - right;Decreased stance time - left;Decreased stride length;Decreased hip/knee flexion - left;Narrow base of support Wheelchair Mobility Wheelchair Mobility: Yes Wheelchair Assistance: 5: Supervision Wheelchair Assistance Details: Verbal cues for precautions/safety;Verbal cues for sequencing;Verbal cues for Diplomatic Services operational officer: Both lower extermities (for L LE NMR) Wheelchair Parts Management: Needs assistance Distance: 115   See FIM for current functional status  Therapy/Group: Individual Therapy  Chipper Herb. Daking Westervelt, PT, DPT 11/01/2013, 11:54 AM

## 2013-11-01 NOTE — Progress Notes (Signed)
Orthopedic Tech Progress Note Patient Details:  Laurie [name] 09-Jun-1946 [id number]  Patient ID: [name], female   DOB: 1946-05-11, 67 y.o.   MRN: [id number]   [name] 11/01/2013, 3:16 PMCalled [facility] for left AFO.

## 2013-11-01 NOTE — Progress Notes (Signed)
Subjective/Complaints: L hip pain. Was 10/10 preinjection yesterday , now is 5-6 /10. No bowel or bladder complaints. No bring problems. Review of Systems - Negative except L side weak  Objective: Vital Signs: Blood pressure 172/72, pulse 72, temperature 98.1 F (36.7 C), temperature source Oral, resp. rate 18, height 5\' 8"  (1.727 m), weight 79.6 kg (175 lb 7.8 oz), SpO2 98.00%. No results found. Results for orders placed during the hospital encounter of 10/26/13 (from the past 72 hour(s))  GLUCOSE, CAPILLARY     Status: Abnormal   Collection Time    10/29/13  7:11 AM      Result Value Range   Glucose-Capillary 106 (*) 70 - 99 mg/dL   Comment 1 Notify RN    GLUCOSE, CAPILLARY     Status: Abnormal   Collection Time    10/29/13 11:41 AM      Result Value Range   Glucose-Capillary 116 (*) 70 - 99 mg/dL   Comment 1 Notify RN    GLUCOSE, CAPILLARY     Status: Abnormal   Collection Time    10/29/13  4:23 PM      Result Value Range   Glucose-Capillary 139 (*) 70 - 99 mg/dL   Comment 1 Notify RN    GLUCOSE, CAPILLARY     Status: Abnormal   Collection Time    10/29/13  9:25 PM      Result Value Range   Glucose-Capillary 121 (*) 70 - 99 mg/dL  GLUCOSE, CAPILLARY     Status: Abnormal   Collection Time    10/30/13  7:07 AM      Result Value Range   Glucose-Capillary 103 (*) 70 - 99 mg/dL   Comment 1 Notify RN    GLUCOSE, CAPILLARY     Status: None   Collection Time    10/30/13 11:43 AM      Result Value Range   Glucose-Capillary 98  70 - 99 mg/dL   Comment 1 Notify RN    GLUCOSE, CAPILLARY     Status: Abnormal   Collection Time    10/30/13  4:24 PM      Result Value Range   Glucose-Capillary 150 (*) 70 - 99 mg/dL   Comment 1 Notify RN    GLUCOSE, CAPILLARY     Status: Abnormal   Collection Time    10/30/13  9:07 PM      Result Value Range   Glucose-Capillary 111 (*) 70 - 99 mg/dL   Comment 1 Notify RN    GLUCOSE, CAPILLARY     Status: Abnormal   Collection Time   10/31/13  7:16 AM      Result Value Range   Glucose-Capillary 109 (*) 70 - 99 mg/dL   Comment 1 Notify RN    GLUCOSE, CAPILLARY     Status: Abnormal   Collection Time    10/31/13 11:28 AM      Result Value Range   Glucose-Capillary 132 (*) 70 - 99 mg/dL  GLUCOSE, CAPILLARY     Status: Abnormal   Collection Time    10/31/13  4:32 PM      Result Value Range   Glucose-Capillary 168 (*) 70 - 99 mg/dL  GLUCOSE, CAPILLARY     Status: Abnormal   Collection Time    10/31/13  9:41 PM      Result Value Range   Glucose-Capillary 176 (*) 70 - 99 mg/dL     HEENT: normal and throat clear Cardio: RRR and no murmur  Resp: CTA B/L and unlabored GI: BS positive and non distended Extremity:  Pulses positive and No Edema, L hip No tenderness lateral Skin:   Intact Neuro: Alert/Oriented, Cranial Nerve II-XII normal, Normal Sensory, Abnormal Motor 3-/5 Left bi tri, delt and grip, 4-/5 L HF,KE,2-ADF and Abnormal FMC Ataxic/ dec FMC Musc/Skel:  Normal Gen NAD   Assessment/Plan: 1. Functional deficits secondary to Right pointine infarct with L Hemiparesis which require 3+ hours per day of interdisciplinary therapy in a comprehensive inpatient rehab setting. Physiatrist is providing close team supervision and 24 hour management of active medical problems listed below. Physiatrist and rehab team continue to assess barriers to discharge/monitor patient progress toward functional and medical goals. FIM: FIM - Bathing Bathing Steps Patient Completed: Chest;Front perineal area;Right lower leg (including foot);Left lower leg (including foot);Buttocks;Right upper leg;Left upper leg;Abdomen;Left Arm Bathing: 4: Min-Patient completes 8-9 13f 10 parts or 75+ percent  FIM - Upper Body Dressing/Undressing Upper body dressing/undressing steps patient completed: Thread/unthread right sleeve of pullover shirt/dresss;Pull shirt over trunk;Thread/unthread left sleeve of pullover shirt/dress;Put head through opening of  pull over shirt/dress Upper body dressing/undressing: 5: Set-up assist to: Obtain clothing/put away FIM - Lower Body Dressing/Undressing Lower body dressing/undressing steps patient completed: Thread/unthread right underwear leg;Thread/unthread left underwear leg;Pull underwear up/down;Don/Doff left sock;Don/Doff right sock;Thread/unthread right pants leg;Thread/unthread left pants leg;Pull pants up/down Lower body dressing/undressing: 5: Supervision: Safety issues/verbal cues  FIM - Toileting Toileting steps completed by patient: Adjust clothing prior to toileting;Performs perineal hygiene;Adjust clothing after toileting Toileting Assistive Devices: Grab bar or rail for support Toileting: 5: Supervision: Safety issues/verbal cues  FIM - Diplomatic Services operational officer Devices: Occupational hygienist Transfers: 4-To toilet/BSC: Min A (steadying Pt. > 75%);4-From toilet/BSC: Min A (steadying Pt. > 75%)  FIM - Bed/Chair Transfer Bed/Chair Transfer Assistive Devices: Bed rails;Arm rests;HOB elevated Bed/Chair Transfer: 5: Supine > Sit: Supervision (verbal cues/safety issues);4: Bed > Chair or W/C: Min A (steadying Pt. > 75%);4: Chair or W/C > Bed: Min A (steadying Pt. > 75%)  FIM - Locomotion: Wheelchair Distance: 115 Locomotion: Wheelchair: 2: Travels 50 - 149 ft with minimal assistance (Pt.>75%) FIM - Locomotion: Ambulation Locomotion: Ambulation Assistive Devices: Occupational hygienist Ambulation/Gait Assistance: 5: Supervision;4: Min guard Locomotion: Ambulation: 2: Travels 50 - 149 ft with minimal assistance (Pt.>75%)  Comprehension Comprehension Mode: Auditory Comprehension: 5-Understands complex 90% of the time/Cues < 10% of the time  Expression Expression Mode: Verbal Expression: 4-Expresses basic 75 - 89% of the time/requires cueing 10 - 24% of the time. Needs helper to occlude trach/needs to repeat words.  Social Interaction Social Interaction: 5-Interacts appropriately 90% of the  time - Needs monitoring or encouragement for participation or interaction.  Problem Solving Problem Solving: 5-Solves basic problems: With no assist  Memory Memory: 5-Recognizes or recalls 90% of the time/requires cueing < 10% of the time  Medical Problem List and Plan:  1. DVT Prophylaxis/Anticoagulation: Pharmaceutical: Lovenox  2. Pain Management: L Hip pain, troch bursitis-Injection helpful, PT will also address 3. Mood: Motivated to get better. Will have LCSW follow for evaluation.  4. Neuropsych: This patient is capable of making decisions on her own behalf.  5 Diabetes: Will monitor with ac/hs checks--use SSI for tighter control. Continue metformin bid. CBG may increase s/p celestone inj for 2-3 days 6. HTN: Will monitor with bid checks--avoid hypotension. Was restarted on Prinivil and HCTZ--prinivil dose increased today since systolic BPs still elevated   LOS (Days) 6 A FACE TO FACE EVALUATION WAS PERFORMED  Erick Colace  11/01/2013, 7:07 AM

## 2013-11-02 ENCOUNTER — Inpatient Hospital Stay (HOSPITAL_COMMUNITY): Payer: Medicare Other

## 2013-11-02 ENCOUNTER — Inpatient Hospital Stay (HOSPITAL_COMMUNITY): Payer: Medicare Other | Admitting: Speech Pathology

## 2013-11-02 ENCOUNTER — Inpatient Hospital Stay (HOSPITAL_COMMUNITY): Payer: Medicare Other | Admitting: *Deleted

## 2013-11-02 LAB — GLUCOSE, CAPILLARY
Glucose-Capillary: 85 mg/dL (ref 70–99)
Glucose-Capillary: 170 mg/dL — ABNORMAL HIGH (ref 70–99)

## 2013-11-02 LAB — BASIC METABOLIC PANEL
CO2: 22 mEq/L (ref 19–32)
Glucose, Bld: 94 mg/dL (ref 70–99)
Potassium: 4 mEq/L (ref 3.5–5.1)
Sodium: 140 mEq/L (ref 135–145)

## 2013-11-02 NOTE — Progress Notes (Signed)
Speech Language Pathology Daily Session Note & Discharge Summary  Patient Details  Name: Laurie [name] MRN: [id number] Date of Birth: 19-Jan-1946  Today's Date: 11/02/2013 Time: 1430-1520 Time Calculation (min): 50 min  Short Term Goals: Week 1: SLP Short Term Goal 1 (Week 1): Patient will demonstrate anticipatory awareness with Supervision questioning and verbal cues. SLP Short Term Goal 2 (Week 1): Patient will solve complex, functional problems with Supervision questioning and verbal cues. SLP Short Term Goal 3 (Week 1): Patient will recall three memory compensatory strategies with Mod I. SLP Short Term Goal 4 (Week 1): Patient will utilize memory compensatory strategies during complex, functional tasks with Supervision verbal cues. SLP Short Term Goal 5 (Week 1): Patient will recall three speech intelligibility strategies with Mod I. SLP Short Term Goal 6 (Week 1): Patient will utilize speech intelligibility strategies during structured task with Supervision verbal cues.  Skilled Therapeutic Interventions: Skilled treatment session focused on speech and cognition goals.  SLP facilitated session with medication management task with the use of an external memory aid; patient utilized the aid and self-monitored throughout the complex task with Mod I.  Patient utilized speech intelligibility strategies with Mod I and speech was intelligible through the conversation with 100% accuracy.  SLP also facilitated session with discussion regarding being discharged from speech therapy; patient demonstrated anticipatory awareness with Mod I.   Discussion was supplemented with handouts to facilitate carry-over of strategies upon discharge home.  Speech and cognition goals met; patient ready for discharge from speech therapy.   FIM:  Comprehension Comprehension Mode: Auditory Comprehension: 6-Follows complex conversation/direction: With extra time/assistive device Expression Expression Mode:  Verbal Expression: 6-Expresses complex ideas: With extra time/assistive device Social Interaction Social Interaction: 5-Interacts appropriately 90% of the time - Needs monitoring or encouragement for participation or interaction. Problem Solving Problem Solving: 6-Solves complex problems: With extra time Memory Memory: 6-Assistive device: No helper  Pain Pain Assessment Pain Assessment: No/denies pain Pain Score: 0-No pain Pain Location: Hip  Therapy/Group: Individual Therapy     Speech Language Pathology Discharge Summary  Patient Details  Name: Laurie [name] MRN: [id number] Date of Birth: 1946/10/18  Today's Date: 11/02/2013  Patient has met 4 of 4 long term goals.  Patient to discharge at overall Modified Independent level.  Reasons goals not met: N/A   Clinical Impression/Discharge Summary: Patient met 4 out of 4 long term goals.  Patient increased speech intelligibility and can utilize strategies with Mod I at the conversation level.  Cognitively, patient has increased her complex functional problem solving abilities and her awareness of her deficits, both with Mod I.  Additionally, patient can utilize memory compensatory aids and demonstrate memory strategies with Mod I.  Patient education completed with handouts and teach-back opportunities; patient is ready for discharge from speech-language pathology services.  It is not recommended that patient continue to receive services at this time as her communication and cognition do not affect her safety or functional independence.  Patient is ready for discharge home to family.   Care Partner:  Caregiver Able to Provide Assistance: Yes (Intermittently; daughter works 7 days a week)  Type of Caregiver Assistance: Cognitive  Recommendation:  None      Equipment: None   Reasons for discharge: Treatment goals met   Patient/Family Agrees with Progress Made and Goals Achieved: Yes   See FIM for current functional  status  Levora Angel 11/02/2013, 3:48 PM

## 2013-11-02 NOTE — Progress Notes (Signed)
Social Work Patient ID: Laurie [name], female   DOB: 1946/06/14, 67 y.o.   MRN: [id number] Met with pt to inform of team conference goals-mod/i level and discharge 11/15.  She is sad to be leaving [place] she likes it here. Her hip is bothering her today due to sleeping on it last night.  Discussed discharge needs and follow up.  She is hopeful her hip is better By tomorrow.  Have arranged a PCP for her to follow up with.

## 2013-11-02 NOTE — Patient Care Conference (Signed)
Inpatient RehabilitationTeam Conference and Plan of Care Update Date: 11/02/2013   Time: 10;50 AM    Patient Name: Laurie [name]      Medical Record Number: [id number]  Date of Birth: January 15, 1946 Sex: Female         Room/Bed: 4M02C/4M02C-01 Payor Info: Payor: MEDICARE / Plan: MEDICARE PART A AND B / Product Type: *No Product type* /    Admitting Diagnosis: R CVA  Admit Date/Time:  10/26/2013  4:52 PM Admission Comments: No comment available   Primary Diagnosis:  CVA (cerebral infarction) Principal Problem: CVA (cerebral infarction)  Patient Active Problem List   Diagnosis Date Noted  . Chronic diastolic heart failure, grade 2 10/26/2013  . Dyslipidemia 10/26/2013  . CVA (cerebral infarction) 10/25/2013  . Arthralgia of hip 10/25/2013  . ANEMIA, NORMOCYTIC 05/17/2009  . Type II or unspecified type diabetes mellitus with neurological manifestations, not stated as uncontrolled(250.60) 05/08/2009  . ANXIETY 05/08/2009  . HYPERTENSION, UNCONTROLLED 05/08/2009    Expected Discharge Date: Expected Discharge Date: 11/05/13  Team Members Present: Physician leading conference: Dr. Claudette Laws Social Worker Present: Dossie Der, LCSW Nurse Present: Gregor Hams, RN PT Present: Cyndia Skeeters, Lillie Columbia, PT OT Present: Scherrie November, OT;Kris Jacklynn Lewis, OT SLP Present: Maxcine Ham, SLP PPS Coordinator present : Tora Duck, RN, CRRN     Current Status/Progress Goal Weekly Team Focus  Medical   Left Hip pain, some improvement with injection  improve L hip ROM and Strength  Cont PT efforts, position properly in bed   Bowel/Bladder   Continent of bowel and bladder  Remain continent of bowel and bladder  Monitor   Swallow/Nutrition/ Hydration   Mod I; regular diet  N/A  N/A   ADL's   min-supervision overall (steadying assist for standing balance), min assist overall dynamic standing balance,  Mod I  postural control in standing, functional transfers, L NMR,  strengthening, education   Mobility   modI bed mobility, S transfers, S-min guard ambulation x125' with [facility], and minA 2 stairs  mod I overall; S car transfer  safety, transfers, gait, stairs, balance, L LE NMR, postural control   Communication   Mod I  Mod I  Monitor intelligibility while focusing on cognition   Safety/Cognition/ Behavioral Observations  Min assist to Supervision  Mod I  Focus on complex problem solving, use of memory aids, anticipatory awareness   Pain   right hip pain cortisone shot, ultram50mg  q6  pain < or = 3  Monitor pain q shift and prn   Skin   no skin issues  remain free from breakdown/infection  Monitor skin q shift and prn      *See Care Plan and progress notes for long and short-term goals.  Barriers to Discharge: needs to be Mod I    Possible Resolutions to Barriers:  Cont rehab see above    Discharge Planning/Teaching Needs:  Home with daughter who works during the day-needs to be mod/i level      Team Discussion:  Making good progress-hip pain from sleeping on it last night. L-afo ordered.  Pt stubborn and does what she wants-ques if will be compliant at home.  MD to speak with, she listens to him  Revisions to Treatment Plan:  Discharge speech today   Continued Need for Acute Rehabilitation Level of Care: The patient requires daily medical management by a physician with specialized training in physical medicine and rehabilitation for the following conditions: Daily direction of a multidisciplinary physical rehabilitation program to ensure  safe treatment while eliciting the highest outcome that is of practical value to the patient.: Yes Daily medical management of patient stability for increased activity during participation in an intensive rehabilitation regime.: Yes Daily analysis of laboratory values and/or radiology reports with any subsequent need for medication adjustment of medical intervention for : Neurological problems;Other  [name],  [name] 11/02/2013, 3:15 PM

## 2013-11-02 NOTE — Progress Notes (Signed)
Occupational Therapy Session Note  Patient Details  Name: Laurie [name] MRN: [id number] Date of Birth: 29-Jul-1946  Today's Date: 11/02/2013 Time: 0930-1028 Time Calculation (min): 58 min  Short Term Goals: Week 1:  OT Short Term Goal 1 (Week 1): STGs=LTGs secondary to short ELOS  Skilled Therapeutic Interventions/Progress Updates:  Pt seen for ADL retraining with focus on bathing, dressing and weight bearing through LUE. Pt in w/c upon arrival. Pt ambulated with [facility] with close supervision throughout room to obtain clothing by placing clothes in between her LUE and her waist. Pt ambulated with [facility] with supervision to walk in shower. Pt completed bathing on tub bench sit > stand level with supervision. Pt required Min VC to recall to place her left arm in shirt first to complete hemi dressing technique. Pt able to don all clothes sit > stand level with supervision with assistance from therapist to don AFO. Pt had difficulty tying shoes therefore therapist introduced elastic shoe laces. Pt stated she liked the elastic laces and was able to don her right shoe. Pt ambulated with [facility] with supervision to sink to complete grooming tasks while weight bearing through LUE. Pt then used her LUE to wipe the counter with paper towels moving LUE through flexion/extension and abduction/adduction. While standing Pt would lean towards right requiring Min VC to "stand tall". Pt also would hike shoulder requiring tactile cues to keep shoulder in stable position. Pt left in room in w/c with all needs in reach.   Therapy Documentation Precautions:  Precautions Precautions: Fall Restrictions Weight Bearing Restrictions: No Pain: Pain Assessment Pain Assessment: 0-10 Pain Score: 0-No pain Patients Stated Pain Goal: 3 Multiple Pain Sites: No  See FIM for current functional status  Therapy/Group: Individual Therapy  Donna Christen 11/02/2013, 10:41 AM

## 2013-11-02 NOTE — Progress Notes (Signed)
The skilled treatment note and discharge summary have been reviewed and SLP is in agreement. Kameran Lallier, M.A., CCC-SLP [phone]  

## 2013-11-02 NOTE — Progress Notes (Signed)
Social Work Lucy Chris, LCSW Social Worker Signed  Patient Care Conference Service date: 11/02/2013 3:14 PM  Inpatient RehabilitationTeam Conference and Plan of Care Update Date: 11/02/2013   Time: 10;50 AM     Patient Name: Laurie [name]       Medical Record Number: [id number]   Date of Birth: Aug 15, 1946 Sex: Female         Room/Bed: 4M02C/4M02C-01 Payor Info: Payor: MEDICARE / Plan: MEDICARE PART A AND B / Product Type: *No Product type* /   Admitting Diagnosis: R CVA   Admit Date/Time:  10/26/2013  4:52 PM Admission Comments: No comment available   Primary Diagnosis:  CVA (cerebral infarction) Principal Problem: CVA (cerebral infarction)    Patient Active Problem List     Diagnosis  Date Noted   .  Chronic diastolic heart failure, grade 2  10/26/2013   .  Dyslipidemia  10/26/2013   .  CVA (cerebral infarction)  10/25/2013   .  Arthralgia of hip  10/25/2013   .  ANEMIA, NORMOCYTIC  05/17/2009   .  Type II or unspecified type diabetes mellitus with neurological manifestations, not stated as uncontrolled(250.60)  05/08/2009   .  ANXIETY  05/08/2009   .  HYPERTENSION, UNCONTROLLED  05/08/2009     Expected Discharge Date: Expected Discharge Date: 11/05/13  Team Members Present: Physician leading conference: Dr. Claudette Laws Social Worker Present: Dossie Der, LCSW Nurse Present: Gregor Hams, RN PT Present: Cyndia Skeeters, Lillie Columbia, PT OT Present: Scherrie November, OT;Kris Jacklynn Lewis, OT SLP Present: Maxcine Ham, SLP PPS Coordinator present : Tora Duck, RN, CRRN        Current Status/Progress  Goal  Weekly Team Focus   Medical     Left Hip pain, some improvement with injection  improve L hip ROM and Strength  Cont PT efforts, position properly in bed   Bowel/Bladder     Continent of bowel and bladder  Remain continent of bowel and bladder  Monitor   Swallow/Nutrition/ Hydration     Mod I; regular diet  N/A  N/A   ADL's     min-supervision  overall (steadying assist for standing balance), min assist overall dynamic standing balance,  Mod I  postural control in standing, functional transfers, L NMR, strengthening, education   Mobility     modI bed mobility, S transfers, S-min guard ambulation x125' with [facility], and minA 2 stairs  mod I overall; S car transfer  safety, transfers, gait, stairs, balance, L LE NMR, postural control   Communication     Mod I  Mod I  Monitor intelligibility while focusing on cognition   Safety/Cognition/ Behavioral Observations    Min assist to Supervision  Mod I  Focus on complex problem solving, use of memory aids, anticipatory awareness   Pain     right hip pain cortisone shot, ultram50mg  q6  pain < or = 3  Monitor pain q shift and prn   Skin     no skin issues  remain free from breakdown/infection  Monitor skin q shift and prn     *See Care Plan and progress notes for long and short-term goals.    Barriers to Discharge:  needs to be Mod I      Possible Resolutions to Barriers:    Cont rehab see above      Discharge Planning/Teaching Needs:    Home with daughter who works during the day-needs to be mod/i level      Team Discussion:  Making good progress-hip pain from sleeping on it last night. L-afo ordered.  Pt stubborn and does what she wants-ques if will be compliant at home.  MD to speak with, she listens to him   Revisions to Treatment Plan:    Discharge speech today    Continued Need for Acute Rehabilitation Level of Care: The patient requires daily medical management by a physician with specialized training in physical medicine and rehabilitation for the following conditions: Daily direction of a multidisciplinary physical rehabilitation program to ensure safe treatment while eliciting the highest outcome that is of practical value to the patient.: Yes Daily medical management of patient stability for increased activity during participation in an intensive rehabilitation regime.:  Yes Daily analysis of laboratory values and/or radiology reports with any subsequent need for medication adjustment of medical intervention for : Neurological problems;Other  Lucy Chris 11/02/2013, 3:15 PM          Patient ID: Laurie [name], female   DOB: 1946-01-04, 67 y.o.   MRN: [id number]

## 2013-11-02 NOTE — Progress Notes (Signed)
Subjective/Complaints: L hip pain. Was 10/10 preinjection  , now is 5-6 /10.Still awakened at noc with this No bowel or bladder complaints. No bring problems. Review of Systems - Negative except L side weak  Objective: Vital Signs: Blood pressure 175/73, pulse 67, temperature 98.2 F (36.8 C), temperature source Oral, resp. rate 17, height 5\' 8"  (1.727 m), weight 79.6 kg (175 lb 7.8 oz), SpO2 98.00%. No results found. Results for orders placed during the hospital encounter of 10/26/13 (from the past 72 hour(s))  GLUCOSE, CAPILLARY     Status: None   Collection Time    10/30/13 11:43 AM      Result Value Range   Glucose-Capillary 98  70 - 99 mg/dL   Comment 1 Notify RN    GLUCOSE, CAPILLARY     Status: Abnormal   Collection Time    10/30/13  4:24 PM      Result Value Range   Glucose-Capillary 150 (*) 70 - 99 mg/dL   Comment 1 Notify RN    GLUCOSE, CAPILLARY     Status: Abnormal   Collection Time    10/30/13  9:07 PM      Result Value Range   Glucose-Capillary 111 (*) 70 - 99 mg/dL   Comment 1 Notify RN    GLUCOSE, CAPILLARY     Status: Abnormal   Collection Time    10/31/13  7:16 AM      Result Value Range   Glucose-Capillary 109 (*) 70 - 99 mg/dL   Comment 1 Notify RN    GLUCOSE, CAPILLARY     Status: Abnormal   Collection Time    10/31/13 11:28 AM      Result Value Range   Glucose-Capillary 132 (*) 70 - 99 mg/dL  GLUCOSE, CAPILLARY     Status: Abnormal   Collection Time    10/31/13  4:32 PM      Result Value Range   Glucose-Capillary 168 (*) 70 - 99 mg/dL  GLUCOSE, CAPILLARY     Status: Abnormal   Collection Time    10/31/13  9:41 PM      Result Value Range   Glucose-Capillary 176 (*) 70 - 99 mg/dL  GLUCOSE, CAPILLARY     Status: Abnormal   Collection Time    11/01/13  7:18 AM      Result Value Range   Glucose-Capillary 150 (*) 70 - 99 mg/dL   Comment 1 Notify RN    GLUCOSE, CAPILLARY     Status: Abnormal   Collection Time    11/01/13 11:14 AM      Result  Value Range   Glucose-Capillary 106 (*) 70 - 99 mg/dL   Comment 1 Notify RN    GLUCOSE, CAPILLARY     Status: Abnormal   Collection Time    11/01/13  4:48 PM      Result Value Range   Glucose-Capillary 154 (*) 70 - 99 mg/dL  GLUCOSE, CAPILLARY     Status: Abnormal   Collection Time    11/01/13  8:49 PM      Result Value Range   Glucose-Capillary 105 (*) 70 - 99 mg/dL  BASIC METABOLIC PANEL     Status: Abnormal   Collection Time    11/02/13  5:20 AM      Result Value Range   Sodium 140  135 - 145 mEq/L   Potassium 4.0  3.5 - 5.1 mEq/L   Chloride 105  96 - 112 mEq/L   CO2 22  19 - 32 mEq/L   Glucose, Bld 94  70 - 99 mg/dL   BUN 25 (*) 6 - 23 mg/dL   Creatinine, Ser 1.61  0.50 - 1.10 mg/dL   Calcium 9.2  8.4 - 09.6 mg/dL   GFR calc non Af Amer 88 (*) >90 mL/min   GFR calc Af Amer >90  >90 mL/min   Comment: (NOTE)     The eGFR has been calculated using the CKD EPI equation.     This calculation has not been validated in all clinical situations.     eGFR's persistently <90 mL/min signify possible Chronic Kidney     Disease.     HEENT: normal and throat clear Cardio: RRR and no murmur Resp: CTA B/L and unlabored GI: BS positive and non distended Extremity:  Pulses positive and No Edema, L hip No tenderness lateral Skin:   Intact Neuro: Alert/Oriented, Cranial Nerve II-XII normal, Normal Sensory, Abnormal Motor 3-/5 Left bi tri, delt and grip, 4-/5 L HF,KE,2-ADF and Abnormal FMC Ataxic/ dec FMC Musc/Skel:  Normal Gen NAD   Assessment/Plan: 1. Functional deficits secondary to Right pointine infarct with L Hemiparesis which require 3+ hours per day of interdisciplinary therapy in a comprehensive inpatient rehab setting. Physiatrist is providing close team supervision and 24 hour management of active medical problems listed below. Physiatrist and rehab team continue to assess barriers to discharge/monitor patient progress toward functional and medical goals. FIM: FIM -  Bathing Bathing Steps Patient Completed: Chest;Front perineal area;Right lower leg (including foot);Left lower leg (including foot);Buttocks;Right upper leg;Left upper leg;Abdomen;Left Arm;Right Arm Bathing: 5: Set-up assist to: Obtain items  FIM - Upper Body Dressing/Undressing Upper body dressing/undressing steps patient completed: Thread/unthread right sleeve of pullover shirt/dresss;Pull shirt over trunk;Thread/unthread left sleeve of pullover shirt/dress;Put head through opening of pull over shirt/dress Upper body dressing/undressing: 6: Assistive device (Comment) FIM - Lower Body Dressing/Undressing Lower body dressing/undressing steps patient completed: Thread/unthread right underwear leg;Thread/unthread left underwear leg;Pull underwear up/down;Thread/unthread right pants leg;Thread/unthread left pants leg;Pull pants up/down;Don/Doff left sock;Don/Doff right sock Lower body dressing/undressing: 4: Steadying Assist  FIM - Toileting Toileting steps completed by patient: Adjust clothing prior to toileting;Performs perineal hygiene;Adjust clothing after toileting Toileting Assistive Devices: Grab bar or rail for support Toileting: 5: Supervision: Safety issues/verbal cues  FIM - Diplomatic Services operational officer Devices: Occupational hygienist Transfers: 4-To toilet/BSC: Min A (steadying Pt. > 75%);4-From toilet/BSC: Min A (steadying Pt. > 75%)  FIM - Bed/Chair Transfer Bed/Chair Transfer Assistive Devices: Arm rests;Cane Bed/Chair Transfer: 4: Bed > Chair or W/C: Min A (steadying Pt. > 75%);4: Chair or W/C > Bed: Min A (steadying Pt. > 75%)  FIM - Locomotion: Wheelchair Distance: 115 Locomotion: Wheelchair: 2: Travels 50 - 149 ft with supervision, cueing or coaxing FIM - Locomotion: Ambulation Locomotion: Ambulation Assistive Devices: Occupational hygienist Ambulation/Gait Assistance: 4: Min assist Locomotion: Ambulation: 2: Travels 50 - 149 ft with minimal assistance  (Pt.>75%)  Comprehension Comprehension Mode: Auditory Comprehension: 5-Understands complex 90% of the time/Cues < 10% of the time  Expression Expression Mode: Verbal Expression: 5-Expresses basic needs/ideas: With no assist  Social Interaction Social Interaction: 5-Interacts appropriately 90% of the time - Needs monitoring or encouragement for participation or interaction.  Problem Solving Problem Solving: 5-Solves basic problems: With no assist  Memory Memory: 5-Recognizes or recalls 90% of the time/requires cueing < 10% of the time  Medical Problem List and Plan:  1. DVT Prophylaxis/Anticoagulation: Pharmaceutical: Lovenox  2. Pain Management: L Hip pain, troch bursitis-Injection  helpful, PT will also address 3. Mood: Motivated to get better. Will have LCSW follow for evaluation.  4. Neuropsych: This patient is capable of making decisions on her own behalf.  5 Diabetes: Will monitor with ac/hs checks--use SSI for tighter control. Continue metformin bid. CBG may increase s/p celestone inj for 2-3 days 6. HTN: Will monitor with bid checks--avoid hypotension. Was restarted on Prinivil and HCTZ--prinivil dose increased  since systolic BPs still elevated-monitor and titrate   LOS (Days) 7 A FACE TO FACE EVALUATION WAS PERFORMED  KIRSTEINS,ANDREW E 11/02/2013, 7:33 AM

## 2013-11-02 NOTE — Progress Notes (Signed)
Therapy note reviewed and this clinician agrees with the information provided.

## 2013-11-02 NOTE — Progress Notes (Signed)
Physical Therapy Session Note  Patient Details  Name: Laurie [name] MRN: [id number] Date of Birth: [birth year]  Today's Date: 11/02/2013 Time: 0830-0930 and 1300-1330 Time Calculation (min): 60 min and 30 min  Short Term Goals: Week 1:  PT Short Term Goal 1 (Week 1): STGs=LTGs secondary to short ELOS  Skilled Therapeutic Interventions/Progress Updates:    AM Session: Patient received sitting edge of bed, receiving medications from RN. Session focused on gait training, stair negotiation, high level ambulation, and L LE NMR; see details below. Patient's personal L anterior tibial toe off AFO present in room, supplied by orthotist yesterday. Patient returned to room at end of session and left seated in wheelchair with all needs within reach.  PM Session: Patient received sitting in wheelchair. Session focused on gait training and L LE/L UE NMR; see details below. Patient with AFO donned for session. Patient performed gait training 110' no AD and minA. Patient demonstrates improved lateral weight shifts, but decreased R step length. Patient left seated in recliner with all needs within reach.  Therapy Documentation Precautions:  Precautions Precautions: Fall Restrictions Weight Bearing Restrictions: No Pain: Pain Assessment Pain Assessment: 0-10 Pain Score: 5  Pain Location: Hip Pain Orientation: Left Pain Descriptors / Indicators: Aching;Sore Pain Onset: On-going Locomotion : Ambulation Ambulation: Yes Ambulation/Gait Assistance: 5: Supervision Ambulation Distance (Feet): 165 Feet Assistive device: Large base quad cane Ambulation/Gait Assistance Details: Verbal cues for gait pattern;Verbal cues for precautions/safety Ambulation/Gait Assistance Details: Patient with personal anterior tibial toe off AFO donned. Patient instructed in gait training 165' x1 and 33' x2 (to/from stairs) in controlled environment with close supervision. Patient requires verbal cues for increased L  hip/knee flexion to improve L foot clearance as patient intermittently does not completely clear foot. Gait Gait: Yes Gait Pattern: Impaired Gait Pattern: Step-through pattern;Decreased step length - right;Decreased stance time - left;Decreased stride length;Decreased hip/knee flexion - left;Narrow base of support High Level Ambulation High Level Ambulation: Backwards walking;Side stepping Side Stepping: x25' in each direction, close supervision, no UE support, 2 posterior LOB that patient was able to self-correct Backwards Walking: 37' x2, close supervision, no UE support, no overt LOB Stairs / Additional Locomotion Stairs: Yes Stairs Assistance: 5: Supervision Stairs Assistance Details: Verbal cues for precautions/safety;Verbal cues for sequencing;Verbal cues for technique;Manual facilitation for weight shifting Stairs Assistance Details (indicate cue type and reason): Patient ascended 4 steps forwards (ascends forwards, descends backwards). Stair Management Technique: One rail Right;Step to pattern;Sideways;Backwards;Forwards Number of Stairs: 4 Height of Stairs: 9.5 Wheelchair Mobility Wheelchair Mobility: Yes Wheelchair Assistance: 5: Supervision Wheelchair Assistance Details: Verbal cues for precautions/safety;Verbal cues for sequencing;Verbal cues for technique Wheelchair Propulsion: Left lower extremity;Both lower extermities (forwards w/ B LEs, backwards w/ L LE only for NMR; emphasis on increased hip/knee flexion) Distance: 150 (50' forwards w/ B LEs, 100' backwards w/ L LE)  Other Treatments: Weight Bearing Technique Weight Bearing Technique: Yes RUE Weight Bearing Technique: Quadruped;High kneeling LUE Weight Bearing Technique: Quadruped;High kneeling;Forearm seated Response to Weight Bearing Technique: Patient performed tall kneeling with L UE on kaye bench in front of her. In this position, patient performed ball taps with R UE to increase weight bearing through L UE x approx  4 min. Patient then transitioned to quadruped with manual facilitation to maintain L elbow extension with increased weight bearing. Patient then performed x20 R hand taps to facilitate weight shifting and increased weight bearing through L UE. Sitting edge of mat, patient performed sidelyling on L elbow while reaching to the L  with R UE to tap targets. After each bout of side sitting on L elbow and taps with R UE, patient returns to sitting upright by pushing through L UE. Patient performed this cycle x10 with SBA.  See FIM for current functional status  Therapy/Group: Individual Therapy  Chipper Herb. Koltin Wehmeyer, PT, DPT 11/02/2013, 3:36 PM

## 2013-11-03 ENCOUNTER — Inpatient Hospital Stay (HOSPITAL_COMMUNITY): Payer: Medicare Other | Admitting: *Deleted

## 2013-11-03 ENCOUNTER — Inpatient Hospital Stay (HOSPITAL_COMMUNITY): Payer: Medicare Other

## 2013-11-03 ENCOUNTER — Inpatient Hospital Stay (HOSPITAL_COMMUNITY): Payer: Medicare Other | Admitting: Rehabilitation

## 2013-11-03 DIAGNOSIS — I6330 Cerebral infarction due to thrombosis of unspecified cerebral artery: Secondary | ICD-10-CM

## 2013-11-03 DIAGNOSIS — G8110 Spastic hemiplegia affecting unspecified side: Secondary | ICD-10-CM

## 2013-11-03 DIAGNOSIS — I10 Essential (primary) hypertension: Secondary | ICD-10-CM

## 2013-11-03 DIAGNOSIS — E1165 Type 2 diabetes mellitus with hyperglycemia: Secondary | ICD-10-CM

## 2013-11-03 DIAGNOSIS — M76899 Other specified enthesopathies of unspecified lower limb, excluding foot: Secondary | ICD-10-CM

## 2013-11-03 LAB — GLUCOSE, CAPILLARY
Glucose-Capillary: 154 mg/dL — ABNORMAL HIGH (ref 70–99)
Glucose-Capillary: 125 mg/dL — ABNORMAL HIGH (ref 70–99)

## 2013-11-03 NOTE — Progress Notes (Signed)
Social Work Patient ID: Laurie [name], female   DOB: 08-15-1946, 67 y.o.   MRN: [id number] Met with pt to discuss tub bench and cost she reports her sister has open she can use when discharged. Her only need is SBQC and follow up therapies.  Pt is very pleased with her progress in her hand today, moving her fingers more. Work toward discharge Sat.

## 2013-11-03 NOTE — Progress Notes (Signed)
Therapy noted reviewed and this clinician agrees with the information provided.

## 2013-11-03 NOTE — Plan of Care (Signed)
Problem: RH KNOWLEDGE DEFICIT Goal: RH STG INCREASE KNOWLEDGE OF DIABETES Patient will verbalize understanding of diabetes and the importance of checking blood sugars and taking prescribed diabetic medication with min assist  Outcome: Progressing Pt stated " do not want to take insulin at home"

## 2013-11-03 NOTE — Progress Notes (Signed)
Subjective/Complaints: L hip pain bothered her again last night.  No bowel or bladder complaints. No bring problems. Review of Systems - Negative except L side weak  Objective: Vital Signs: Blood pressure 148/80, pulse 65, temperature 97.8 F (36.6 C), temperature source Oral, resp. rate 18, height 5\' 8"  (1.727 m), weight 76.295 kg (168 lb 3.2 oz), SpO2 96.00%. No results found. Results for orders placed during the hospital encounter of 10/26/13 (from the past 72 hour(s))  GLUCOSE, CAPILLARY     Status: Abnormal   Collection Time    10/31/13 11:28 AM      Result Value Range   Glucose-Capillary 132 (*) 70 - 99 mg/dL  GLUCOSE, CAPILLARY     Status: Abnormal   Collection Time    10/31/13  4:32 PM      Result Value Range   Glucose-Capillary 168 (*) 70 - 99 mg/dL  GLUCOSE, CAPILLARY     Status: Abnormal   Collection Time    10/31/13  9:41 PM      Result Value Range   Glucose-Capillary 176 (*) 70 - 99 mg/dL  GLUCOSE, CAPILLARY     Status: Abnormal   Collection Time    11/01/13  7:18 AM      Result Value Range   Glucose-Capillary 150 (*) 70 - 99 mg/dL   Comment 1 Notify RN    GLUCOSE, CAPILLARY     Status: Abnormal   Collection Time    11/01/13 11:14 AM      Result Value Range   Glucose-Capillary 106 (*) 70 - 99 mg/dL   Comment 1 Notify RN    GLUCOSE, CAPILLARY     Status: Abnormal   Collection Time    11/01/13  4:48 PM      Result Value Range   Glucose-Capillary 154 (*) 70 - 99 mg/dL  GLUCOSE, CAPILLARY     Status: Abnormal   Collection Time    11/01/13  8:49 PM      Result Value Range   Glucose-Capillary 105 (*) 70 - 99 mg/dL  BASIC METABOLIC PANEL     Status: Abnormal   Collection Time    11/02/13  5:20 AM      Result Value Range   Sodium 140  135 - 145 mEq/L   Potassium 4.0  3.5 - 5.1 mEq/L   Chloride 105  96 - 112 mEq/L   CO2 22  19 - 32 mEq/L   Glucose, Bld 94  70 - 99 mg/dL   BUN 25 (*) 6 - 23 mg/dL   Creatinine, Ser 6.57  0.50 - 1.10 mg/dL   Calcium 9.2   8.4 - 84.6 mg/dL   GFR calc non Af Amer 88 (*) >90 mL/min   GFR calc Af Amer >90  >90 mL/min   Comment: (NOTE)     The eGFR has been calculated using the CKD EPI equation.     This calculation has not been validated in all clinical situations.     eGFR's persistently <90 mL/min signify possible Chronic Kidney     Disease.  GLUCOSE, CAPILLARY     Status: None   Collection Time    11/02/13  7:26 AM      Result Value Range   Glucose-Capillary 85  70 - 99 mg/dL   Comment 1 Notify RN    GLUCOSE, CAPILLARY     Status: Abnormal   Collection Time    11/02/13 11:16 AM      Result Value Range  Glucose-Capillary 170 (*) 70 - 99 mg/dL   Comment 1 Notify RN    GLUCOSE, CAPILLARY     Status: Abnormal   Collection Time    11/02/13  4:57 PM      Result Value Range   Glucose-Capillary 106 (*) 70 - 99 mg/dL  GLUCOSE, CAPILLARY     Status: Abnormal   Collection Time    11/02/13  9:21 PM      Result Value Range   Glucose-Capillary 107 (*) 70 - 99 mg/dL  GLUCOSE, CAPILLARY     Status: None   Collection Time    11/03/13  7:18 AM      Result Value Range   Glucose-Capillary 95  70 - 99 mg/dL   Comment 1 Notify RN       HEENT: normal and throat clear Cardio: RRR and no murmur Resp: CTA B/L and unlabored GI: BS positive and non distended Extremity:  Pulses positive and No Edema, L hip No tenderness lateral Skin:   Intact Neuro: Alert/Oriented, Cranial Nerve II-XII normal, Normal Sensory, Abnormal Motor 3-/5 Left bi tri, delt and grip, 4-/5 L HF,KE,2-ADF and Abnormal FMC Ataxic/ dec FMC Musc/Skel:  Point tenderness along left greater troch Gen NAD   Assessment/Plan: 1. Functional deficits secondary to Right pointine infarct with L Hemiparesis which require 3+ hours per day of interdisciplinary therapy in a comprehensive inpatient rehab setting. Physiatrist is providing close team supervision and 24 hour management of active medical problems listed below. Physiatrist and rehab team continue  to assess barriers to discharge/monitor patient progress toward functional and medical goals. FIM: FIM - Bathing Bathing Steps Patient Completed: Chest;Front perineal area;Right lower leg (including foot);Left lower leg (including foot);Buttocks;Right upper leg;Left upper leg;Abdomen;Left Arm;Right Arm Bathing: 5: Supervision: Safety issues/verbal cues  FIM - Upper Body Dressing/Undressing Upper body dressing/undressing steps patient completed: Thread/unthread right sleeve of pullover shirt/dresss;Pull shirt over trunk;Thread/unthread left sleeve of pullover shirt/dress;Put head through opening of pull over shirt/dress Upper body dressing/undressing: 6: Assistive device (Comment) FIM - Lower Body Dressing/Undressing Lower body dressing/undressing steps patient completed: Thread/unthread right underwear leg;Thread/unthread left underwear leg;Pull underwear up/down;Thread/unthread right pants leg;Thread/unthread left pants leg;Pull pants up/down;Don/Doff left sock;Don/Doff right sock;Don/Doff right shoe;Fasten/unfasten right shoe Lower body dressing/undressing: 4: Min-Patient completed 75 plus % of tasks  FIM - Toileting Toileting steps completed by patient: Performs perineal hygiene;Adjust clothing after toileting Toileting Assistive Devices: Grab bar or rail for support Toileting: 5: Supervision: Safety issues/verbal cues  FIM - Diplomatic Services operational officer Devices: Occupational hygienist Transfers: 4-To toilet/BSC: Min A (steadying Pt. > 75%);4-From toilet/BSC: Min A (steadying Pt. > 75%)  FIM - Bed/Chair Transfer Bed/Chair Transfer Assistive Devices: Arm rests;Cane Bed/Chair Transfer: 5: Chair or W/C > Bed: Supervision (verbal cues/safety issues);5: Bed > Chair or W/C: Supervision (verbal cues/safety issues)  FIM - Locomotion: Wheelchair Distance: 150 (50' forwards w/ B LEs, 100' backwards w/ L LE) Locomotion: Wheelchair: 1: Total Assistance/staff pushes wheelchair (Pt<25%) FIM -  Locomotion: Ambulation Locomotion: Ambulation Assistive Devices: Occupational hygienist Ambulation/Gait Assistance: 4: Min assist Locomotion: Ambulation: 2: Travels 50 - 149 ft with minimal assistance (Pt.>75%)  Comprehension Comprehension Mode: Auditory Comprehension: 6-Follows complex conversation/direction: With extra time/assistive device  Expression Expression Mode: Verbal Expression: 6-Expresses complex ideas: With extra time/assistive device  Social Interaction Social Interaction: 5-Interacts appropriately 90% of the time - Needs monitoring or encouragement for participation or interaction.  Problem Solving Problem Solving: 6-Solves complex problems: With extra time  Memory Memory: 6-Assistive device: No helper  Medical Problem List and Plan:  1. DVT Prophylaxis/Anticoagulation: Pharmaceutical: Lovenox  2. Pain Management: L Hip pain, troch bursitis-Injection helpful, PT will also address  -ice to left hip 3. Mood: Motivated to get better. Will have LCSW follow for evaluation.  4. Neuropsych: This patient is capable of making decisions on her own behalf.  5 Diabetes: Will monitor with ac/hs checks--use SSI for tighter control. Continue metformin bid. CBG may increase s/p celestone inj for 2-3 days 6. HTN: Will monitor with bid checks--avoid hypotension. Was restarted on Prinivil and HCTZ--prinivil dose increased  since systolic BPs still elevated- continue to titrate   LOS (Days) 8 A FACE TO FACE EVALUATION WAS PERFORMED  Michiko Lineman T 11/03/2013, 8:03 AM

## 2013-11-03 NOTE — Progress Notes (Signed)
Physical Therapy Session Note  Patient Details  Name: Laurie [name] MRN: [id number] Date of Birth: 1946-12-21  Today's Date: 11/03/2013 Time: 0802-0845 Time Calculation (min): 43 min  Short Term Goals: Week 1:  PT Short Term Goal 1 (Week 1): STGs=LTGs secondary to short ELOS  Skilled Therapeutic Interventions/Progress Updates:    Patient received sitting edge of bed, finishing breakfast, RN present administering medications. Patient educated on goals of community outing this afternoon. Session focused on functional transfers, L LE NMR, and LE strengthening. Patient with requests to don underwear, able to do so with supervision, patient stands to pull up underwear, no overt LOB. Patient performed [place] exercises with parallel bar for R UE support and supervision: mini squats, heel raises, hip abduction, knee flexion; all x25 reps. Patient with seated active rest breaks: performing L knee extension with 3" hold x25 reps and knee flexion x25 for L LE NMR.  Patient returned to room and left seated in wheelchair with all needs within reach.  Therapy Documentation Precautions:  Precautions Precautions: Fall Restrictions Weight Bearing Restrictions: No Pain: Pain Assessment Pain Assessment: 0-10 Pain Score: 4  Pain Type: Acute pain Pain Location: Hip Pain Orientation: Left Pain Descriptors / Indicators: Aching Pain Frequency: Intermittent Pain Onset: Gradual  See FIM for current functional status  Therapy/Group: Individual Therapy  Laurie [name], PT, DPT 11/03/2013, 8:46 AM

## 2013-11-03 NOTE — Progress Notes (Signed)
Physical Therapy Session Note  Patient Details  Name: Laurie [name] MRN: [id number] Date of Birth: 10-Jan-1946  Today's Date: 11/03/2013 Time: 1131-1205 Time Calculation (min): 34 min  Short Term Goals: Week 2:     Skilled Therapeutic Interventions/Progress Updates:   Pt received sitting in w/c and agreeable to therapy this afternoon.  Gait trained >150' with [facility] and personal L AFO.  See full details below.  Also practiced floor transfer to increase safety at home in case of fall.  Discussed when to attempt to get up from fall vs when to call 911 for assist.  Pt verbalized understanding.  Performed sitting>tall kneeling>semi quadruped position>SL>supine and reverse to get back up to mat.  Pt requires some assist for achieving quadruped from SL position, but did note that she does very well managing LUE during transitions.  Pt ambulated back to room and was left in w/c with all needs in reach.   Therapy Documentation Precautions:  Precautions Precautions: Fall Restrictions Weight Bearing Restrictions: No   Pain: No pain stated during session.      Locomotion : Ambulation Ambulation: Yes Ambulation/Gait Assistance: 5: Supervision Ambulation Distance (Feet): 150 Feet Assistive device: Large base quad cane Ambulation/Gait Assistance Details: Verbal cues for gait pattern;Verbal cues for precautions/safety Ambulation/Gait Assistance Details: Gait trained with pts personal anterior tib toe off AFO donned on LLE and with large based quad cane.  Focused on ambulation in home environment, therefore ambulated to ADL apt to practice ambulation in tight spaces and also on carpeted area.  Pt continues to require supervision, intermittent close supervision on carpet due to increased L foot drag at times.  Provided cues for increased hip/knee flex to ensure L foot clearance with gait.  Ambulated >150' in controlled and home environment.  Gait Gait: Yes Gait Pattern: Impaired Gait Pattern:  Step-through pattern;Decreased step length - right;Decreased stance time - left;Decreased stride length;Decreased hip/knee flexion - left;Narrow base of support;Decreased weight shift to left   See FIM for current functional status  Therapy/Group: Individual Therapy  Vista Deck 11/03/2013, 4:00 PM

## 2013-11-03 NOTE — Progress Notes (Signed)
Physical Therapy Session Note  Patient Details  Name: Laurie [name] MRN: [id number] Date of Birth: 20-Apr-1946  Today's Date: 11/03/2013 Time: 7829-5621 Time Calculation (min): 44 min  Short Term Goals: Week 1:  PT Short Term Goal 1 (Week 1): STGs=LTGs secondary to short ELOS     Skilled Therapeutic Interventions/Progress Updates: community outing with Recreational Therapist to grocery store.  See hard copy of goal sheet in shadow chart.  Pt achieved all goals, which were discussed with her to/from store.  Pt performed gait x 150' with supervision, and up/down 3 narrow, high steps of van, with min assist, min cues for technique.    Therapy Documentation Precautions:  Precautions Precautions: Fall Restrictions Weight Bearing Restrictions: No   Locomotion : Ambulation Ambulation/Gait Assistance: 5: Supervision     See FIM for current functional status  Therapy/Group: Individual Therapy  Mendi Constable 11/03/2013, 3:52 PM

## 2013-11-03 NOTE — Plan of Care (Signed)
Problem: RH KNOWLEDGE DEFICIT Goal: RH STG INCREASE KNOWLEDGE OF HYPERTENSION Patient will verbalize understanding of HTN and importance of regularly taking medications with min assist  Outcome: Progressing Knows has blood pressure medication prescribed but can not name medication

## 2013-11-03 NOTE — Progress Notes (Signed)
Occupational Therapy Session Note  Patient Details  Name: Laurie [name] MRN: [id number] Date of Birth: 1946/07/10  Today's Date: 11/03/2013 Time: 0930-1030 Time Calculation (min) 60  Short Term Goals: Week 1:  OT Short Term Goal 1 (Week 1): STGs=LTGs secondary to short ELOS  Skilled Therapeutic Interventions/Progress Updates:  Pt seen for ADL retraining with focus on tub bench transfers, bathing, and dressing. Pt in w/c in room upon arrival. Pt ambulated with [facility] to obtain clothing, placing clothes underneath left arm adducted towards body. Therapist wheeled Pt to ADL apartment to complete bathing in bathroom. Pt stated "PT said I could walk to the bathroom without a cane" and she started walking. Pt required Min A to ambulate to bathroom and position on tub bench. Educated Pt on using LBQC to ambulate at home and with OT. Pt completed bathing in tub shower at sit level, completing perineal and buttocks hygiene using lateral leans with supervision. Pt completed dressing on tub bench sit > stand level with feet outside of tub. Educated Pt on wearing non skid socks or shoes when ambulating in bathroom and drying feet well before standing. Therapist wheeled Pt back to room to complete grooming. Pt ambulated to sink with LBQC to complete brushing hair and oral care while bearing weight through LUE. Pt ambulated with LBQC back to w/c where therapist donned ted hose and AFO on left foot. Pt left in room with all needs in reach.  Therapy Documentation Precautions:  Precautions Precautions: Fall Restrictions Weight Bearing Restrictions: No  Pain: Pain Assessment Pain Assessment: 0-10 Pain Score: 1  Pain Type: Acute pain Pain Location: Hip Pain Orientation: Left Pain Descriptors / Indicators: Aching Pain Frequency: Intermittent Pain Onset: Gradual Patients Stated Pain Goal: 3 Pain Intervention(s): Medication (See eMAR);Repositioned Multiple Pain Sites: No  See FIM for current functional  status  Therapy/Group: Individual Therapy  Donna Christen 11/03/2013, 11:38 AM

## 2013-11-03 NOTE — Progress Notes (Signed)
Recreational Therapy Session Note  Patient Details  Name: Laurie [name] MRN: [id number] Date of Birth: [birth year] Today's Date: 11/03/2013  Pain: no c/o Skilled Therapeutic Interventions/Progress Updates: Pt referred by team for community reintegration.  Chart reviewed & order obtained.  Pt participated in community reintegration with focus on safe mobility on various surface types ambulatory level using LBQC.  Education provided on energy conservation techniques, obstacle negotiation, & accessing public restroom.  See outing goal sheet for full details.  Therapy/Group: [employer]  Akelia Husted 11/03/2013, 3:35 PM

## 2013-11-04 ENCOUNTER — Inpatient Hospital Stay (HOSPITAL_COMMUNITY): Payer: Medicare Other

## 2013-11-04 ENCOUNTER — Inpatient Hospital Stay (HOSPITAL_COMMUNITY): Payer: Medicare Other | Admitting: *Deleted

## 2013-11-04 LAB — GLUCOSE, CAPILLARY
Glucose-Capillary: 91 mg/dL (ref 70–99)
Glucose-Capillary: 97 mg/dL (ref 70–99)
Glucose-Capillary: 154 mg/dL — ABNORMAL HIGH (ref 70–99)
Glucose-Capillary: 114 mg/dL — ABNORMAL HIGH (ref 70–99)

## 2013-11-04 MED ORDER — TRAMADOL HCL 50 MG PO TABS: 50.0000 mg | ORAL_TABLET | Freq: Four times a day (QID) | ORAL | Status: DC | PRN

## 2013-11-04 MED ORDER — METFORMIN HCL 500 MG PO TABS: 500.0000 mg | ORAL_TABLET | Freq: Two times a day (BID) | ORAL | Status: DC

## 2013-11-04 MED ORDER — ATORVASTATIN CALCIUM 10 MG PO TABS: 10.0000 mg | ORAL_TABLET | Freq: Every day | ORAL | Status: AC

## 2013-11-04 MED ORDER — HYDROCHLOROTHIAZIDE 12.5 MG PO CAPS: 12.5000 mg | ORAL_CAPSULE | Freq: Every day | ORAL | Status: DC

## 2013-11-04 MED ORDER — LISINOPRIL 10 MG PO TABS: 10.0000 mg | ORAL_TABLET | Freq: Two times a day (BID) | ORAL | Status: DC

## 2013-11-04 NOTE — Discharge Summary (Signed)
Physician Discharge Summary  Patient ID: Laurie [name] MRN: [id number] DOB/AGE: 65-Aug-1947 65 y.o.  Admit date: 10/26/2013 Discharge date: 11/05/2013  Discharge Diagnoses:  Principal Problem:   CVA (cerebral infarction) Active Problems:   HYPERTENSION, UNCONTROLLED   Arthralgia of hip   Dyslipidemia   Discharged Condition:  Improved.  Significant Diagnostic Studies: N/A  Labs:  Basic Metabolic Panel:  Recent Labs Lab 11/02/13 0520  NA 140  K 4.0  CL 105  CO2 22  GLUCOSE 94  BUN 25*  CREATININE 0.73  CALCIUM 9.2    CBC:    Component Value Date/Time   WBC 7.6 10/27/2013 0334   RBC 3.89 10/27/2013 0334   HGB 12.5 10/27/2013 0334   HCT 35.8* 10/27/2013 0334   PLT 256 10/27/2013 0334   MCV 92.0 10/27/2013 0334   MCH 32.1 10/27/2013 0334   MCHC 34.9 10/27/2013 0334   RDW 12.6 10/27/2013 0334   LYMPHSABS 3.1 10/27/2013 0334   MONOABS 0.6 10/27/2013 0334   EOSABS 0.2 10/27/2013 0334   BASOSABS 0.0 10/27/2013 0334      CBG:  Recent Labs Lab 11/04/13 0731 11/04/13 1122 11/04/13 1630 11/04/13 2158 11/05/13 0732  GLUCAP 91 97 154* 114* 123*    Brief HPI:   Laurie [name] is a 67 y.o. right-handed female with history of hypertension as well as diabetes mellitus, HTN and tobacco abuse who was admitted 10/24/2013 with left-sided weakness and slurred speech. Patient with history of medication non-compliance as hasn't taken any medications X years. Cranial CT scan was negative. MRI of the brain showed acute nonhemorrhagic infarct anterior right pons as well as remote lacunar infarct posterior right lentiform nucleus. 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 Echocardiogram with ejection fraction of 60% grade 2 diastolic dysfunction. Carotid Dopplers without significant ICA stenosis. Neurology recommends ASA for secondary stroke prevention of thrombotic stroke due to SVD. Patient continues with left sided  weakness with LLE instability and CIR recommended by therapy team.   Hospital Course: Laurie [name] was admitted to rehab 10/26/2013 for inpatient therapies to consist of PT, ST and OT at least three hours five days a week. Past admission physiatrist, therapy team and rehab RN have worked together to provide customized collaborative inpatient rehab. Blood pressures were noted to be poorly controlled and lisinopril was titrated for better control. Lytes have been monitored and mild acute renal insufficiency noted but K+ stable. Diabetes was monitored with ac/hs checks and BS are slowly improving on metformin and modified diet. She has been educated on CM diet as well as medication compliance to help with secondary stroke prevention measures. Po intake has been good and she's continent of B/B. She has had good motivation and has had improvement in left hemiparesis. LLE shows  4-/5 hip flexors, 2+/5; 3-/5 knee extension, 2-/5 PF/DF. RUE is 2-/5 at shoulder,  3-/5 at elbow and 2-/5 intrinsics.  She is modified independent in as supervised setting. She will continue to receive [facility] therapies past discharge.   Rehab course: During patient's stay in rehab weekly team conferences were held to monitor patient's progress, set goals and discuss barriers to discharge. Patient has had improvement in activity tolerance, balance, postural control, as well as ability to compensate for deficits. She is has had improvement in functional use LUE  and LLE as well as improved awareness. She is modified independent for IADLs and ADL tasks. Balance has improved with BERG score  46/52. She is able to ambulate 175 feet with quad cane and L-AFO. She requires supervision for stair navigation.    Disposition:  Home.  Diet: Diabetic diet. Drink 5 glasses of water daily.   Special Instructions: 1. No driving till cleared by MD.  2. Need to take medications as prescribed.  3.  Ice left hip 2-3 times a day.         Future  Appointments Provider Department Dept Phone   12/02/2013 11:00 AM Erick Colace, [facility] [phone]       Medication List    STOP taking these medications       ibuprofen 100 MG tablet  Commonly known as:  ADVIL,MOTRIN     lisinopril-hydrochlorothiazide 10-12.5 MG per tablet  Commonly known as:  PRINZIDE     mupirocin ointment 2 %  Commonly known as:  BACTROBAN      TAKE these medications       aspirin 325 MG tablet  Take 1 tablet (325 mg total) by mouth daily.     atorvastatin 10 MG tablet  Commonly known as:  LIPITOR  Take 1 tablet (10 mg total) by mouth daily at 6 PM.     co-enzyme Q-10 30 MG capsule  Take 100 mg by mouth 2 (two) times daily.     hydrochlorothiazide 12.5 MG capsule  Commonly known as:  MICROZIDE  Take 1 capsule (12.5 mg total) by mouth daily.     lisinopril 10 MG tablet  Commonly known as:  PRINIVIL,ZESTRIL  Take 1 tablet (10 mg total) by mouth 2 (two) times daily. For blood pressure     metFORMIN 500 MG tablet  Commonly known as:  GLUCOPHAGE  Take 1 tablet (500 mg total) by mouth 2 (two) times daily with a meal. For diabetes     traMADol 50 MG tablet  Commonly known as:  ULTRAM  Take 1 tablet (50 mg total) by mouth every 6 (six) hours as needed for moderate pain.       Follow-up Information   Follow up with Erick Colace, MD On 12/02/2013. (Be there at 10:30  for  11 am   appointment)    Specialty:  Physical Medicine and Rehabilitation   Contact information:   [address] [place] [address] [phone]       Follow up with Gates Rigg, MD. Call today. (for follow up in 6 weeks)    Specialties:  Neurology, Radiology   Contact information:   [address] [place] [address] [phone]       Follow up with Raelyn Mora, IBTEHAL, MD On 11/29/2013. (APPT: 11:00 AM)    Specialty:  Internal Medicine   Contact information:   [address] [place] [address] [phone]       Signed: Jacquelynn Cree 11/07/2013, 9:46 AM

## 2013-11-04 NOTE — Progress Notes (Signed)
Occupational Therapy Discharge Summary  Patient Details  Name: Laurie [name] MRN: [id number] Date of Birth: [birth year]  Today's Date: 11/04/2013 Time:0935-1028 4098-1191 Time Calculation (min): 42 min and 53 min  Patient has met 12 of 12 long term goals due to improved activity tolerance, improved balance, postural control, ability to compensate for deficits, functional use of  LEFT upper and LEFT lower extremity, improved attention, improved awareness and improved coordination.  Patient to discharge at overall Modified Independent level.  Patient's care partner not necessary  to provide the necessary physical and cognitive assistance at discharge as patient discharging at Modified Independent level. Patient has been educated on safety at home during IADL and ADL tasks and has demonstrated carryover of this in controlled environment with simulated activities.   Reasons goals not met: N/A. Patient met all LTGs.  Recommendation:  Patient will benefit from ongoing skilled OT services in home health setting to continue to advance functional skills in the area of iADL.  Equipment: No equipment provided  Reasons for discharge: treatment goals met and discharge from hospital  Patient/family agrees with progress made and goals achieved: Yes Skilled Therapeutic Intervention:  1.) Pt seen for ADL retraining with focus on bathing, dressing, and light housekeeping. Pt in w/c in room upon arrival. Pt ambulated in room with [facility] to obtain clothing using her LUE to hold close to body. Therapist wheeled Pt to ADL apartment to complete bathing in bathroom. Pt ambulated with LBQC  to tub bench and completed bathing and dressing Mod I level. Educated Pt on wearing non skid socks or shoes in bathroom for safety. Pt ambulated with [facility] in kitchen to look for and retrieve items overhead. Pt retrieved dishes from dishwasher and washed them in sink using BUE, her right to stabilize the dish and her left hand  holding the dish cloth. Pt then placed dishes back into dish washer. Pt left in room with all needs in reach.    2.) Pt seen 1:1 OT with focus on performing a kitchen task of baking cookies. Pt in w/c in room upon arrival. Therapist wheeled Pt to ADL apartment. Pt ambulated with [facility] in kitchen to retrieve all items needed for baking cookies. Pt used LUE to stabilize cookies while cutting then with RUE. Pt then balled cookies using right hand and used BUE to press cookies onto baking pan. Pt completed all steps by preheating oven, cutting, and placing cookies in oven. Pt carried over awareness of LUE when placing cookies in oven. Pt washed dishes and put items away overhead while waiting for cookies to bake. Educated Pt on using [facility] when ambulating through kitchen and not furniture walking. Pt left in w/c in room with all needs in reach.   OT Discharge Precautions/Restrictions  Precautions Precautions: None Restrictions Weight Bearing Restrictions: No    Pain Pain Assessment Pain Assessment: No/denies pain Pain Score: 0-No pain   Vision/Perception  Vision - History Baseline Vision: Wears glasses only for reading Visual History: Cataracts Patient Visual Report: No change from baseline Vision - Assessment Vision Assessment: Vision tested Convergence: Within functional limits  Cognition Overall Cognitive Status: Within Functional Limits for tasks assessed Arousal/Alertness: Awake/alert Orientation Level: Oriented X4 Attention: Sustained;Selective Sustained Attention: Appears intact Selective Attention: Appears intact Memory: Appears intact Awareness: Appears intact Awareness Impairment: Anticipatory impairment Problem Solving: Appears intact Problem Solving Impairment: Verbal complex Executive Function: Self Monitoring;Self Correcting Self Monitoring: Appears intact Self Correcting: Appears intact Safety/Judgment: Appears intact Sensation Sensation Light Touch: Appears  Intact Hot/Cold:  Appears Intact Proprioception: Appears Intact Additional Comments: Proprioception intact at B ankles and B great toes Coordination Gross Motor Movements are Fluid and Coordinated: No Fine Motor Movements are Fluid and Coordinated: No Coordination and Movement Description: Decreased speed and accuracy with rapid, alternating movements of LEs; unable to test with UEs secondary to decreased strength in L UE Finger Nose Finger Test: unable to completed with LUE Heel Shin Test: Slight dysmetria on L LE Motor  Motor Motor: Hemiplegia;Abnormal tone Motor - Discharge Observations: Mild hemiparesis L side Mobility  Bed Mobility Bed Mobility: Supine to Sit;Sit to Supine Supine to Sit: HOB flat;6: Modified independent (Device/Increase time) Sit to Supine: HOB flat;6: Modified independent (Device/Increase time) Transfers Sit to Stand: 6: Modified independent (Device/Increase time);With armrests;From chair/3-in-1;From bed;With upper extremity assist Stand to Sit: 6: Modified independent (Device/Increase time);To chair/3-in-1;With armrests;To bed;With upper extremity assist  Trunk/Postural Assessment  Cervical Assessment Cervical Assessment: Within Functional Limits Thoracic Assessment Thoracic Assessment: Within Functional Limits Lumbar Assessment Lumbar Assessment: Within Functional Limits Postural Control Postural Control: Within Functional Limits  Balance Balance Balance Assessed: Yes Standardized Balance Assessment Standardized Balance Assessment: Berg Balance Test Berg Balance Test Sit to Stand: Able to stand without using hands and stabilize independently Standing Unsupported: Able to stand safely 2 minutes Sitting with Back Unsupported but Feet Supported on Floor or Stool: Able to sit safely and securely 2 minutes Stand to Sit: Sits safely with minimal use of hands Transfers: Able to transfer safely, definite need of hands Standing Unsupported with Eyes Closed:  Able to stand 10 seconds safely Standing Ubsupported with Feet Together: Able to place feet together independently and stand 1 minute safely From Standing, Reach Forward with Outstretched Arm: Can reach forward >12 cm safely (5") From Standing Position, Pick up Object from Floor: Able to pick up shoe safely and easily From Standing Position, Turn to Look Behind Over each Shoulder: Looks behind from both sides and weight shifts well Turn 360 Degrees: Able to turn 360 degrees safely but slowly Standing Unsupported, Alternately Place Feet on Step/Stool: Able to complete >2 steps/needs minimal assist Standing Unsupported, One Foot in Front: Able to take small step independently and hold 30 seconds Standing on One Leg: Able to lift leg independently and hold 5-10 seconds Total Score: 46 Static Sitting Balance Static Sitting - Balance Support: Feet supported;No upper extremity supported Static Sitting - Level of Assistance: 6: Modified independent (Device/Increase time) Static Standing Balance Static Standing - Balance Support: During functional activity;Right upper extremity supported;No upper extremity supported Static Standing - Level of Assistance: 6: Modified independent (Device/Increase time) Dynamic Standing Balance Dynamic Standing - Balance Support: Right upper extremity supported Dynamic Standing - Level of Assistance: 6: Modified independent (Device/Increase time) Extremity/Trunk Assessment RUE Assessment RUE Assessment: Within Functional Limits LUE Assessment LUE Assessment: Exceptions to [facility] (3-/5 elbow, grossly 2- overall)  See FIM for current functional status  Donna Christen 11/04/2013, 12:23 PM

## 2013-11-04 NOTE — Progress Notes (Signed)
Social Work Discharge Note Discharge Note  The overall goal for the admission was met for:   Discharge location: Yes-HOME WITH DAUGHTER WHO IS THERE IN THE EVENINGS AND SISTER TO ASSIST IF NEEDED DURING THE DAY  Length of Stay: Yes-10 DAYS  Discharge activity level: Yes-MOD/I LEVEL  Home/community participation: Yes  Services provided included: MD, RD, PT, OT, SLP, RN, Pharmacy and SW  Financial Services: Medicare  Follow-up services arranged: Home Health: [facility]-PT,OT,RN, DME: [facility] and Patient/Family has no preference for HH/DME agencies  Comments (or additional information):PT DID WELL HERE ,HOPEFULLY WILL FOLLOW RECOMENDATIONS AND TAKE MEDICINES AND FOLLOW UP WITH PCP PT REPORTS BEING VERY STUBBORN AND DOES WHAT SHE WANTS.  NEEDS TO FOLLOW DIABETES MANAGEMENT AND DIET   Patient/Family verbalized understanding of follow-up arrangements: Yes  Individual responsible for coordination of the follow-up plan: SELF  Confirmed correct DME delivered: Lucy Chris 11/04/2013    Lucy Chris

## 2013-11-04 NOTE — Progress Notes (Addendum)
Physical Therapy Discharge Summary  Patient Details  Name: Laurie [name] MRN: [id number] Date of Birth: 1946/11/08  Today's Date: 11/04/2013 Time: [id number] 1415-1500 Time Calculation (min): 57 min and 45 min  Patient has met 11 of 11 long term goals due to improved activity tolerance, improved balance, improved postural control, increased strength, decreased pain, ability to compensate for deficits, functional use of  left upper extremity and left lower extremity and improved coordination.  Patient to discharge at an ambulatory level Modified Independent.   Patient's care partner not necessary secondary to patient discharging at mod I level. to provide the necessary assistance at discharge. Patient has been educated that she requires supervision for stair negotiation and patient verbalizes understanding, stating "I won't be leaving my house without either my daughter or my granddaughter."  Reasons goals not met: N/A, all LTGs met.  Recommendation:  Patient will benefit from ongoing skilled PT services in home health setting to continue to advance safe functional mobility, address ongoing impairments in strength, balance, coordination, activity tolerance, postural control, gait, and minimize fall risk.  Equipment: SBQC and L anterior tibial toe off AFO  Reasons for discharge: treatment goals met and discharge from hospital  Patient/family agrees with progress made and goals achieved: Yes  Skilled Interventions: AM Session: Patient received sitting edge of bed. Session focused on functional mobility and evaluation of goals in preparation for discharge; see details below. Patient performed car transfer and furniture transfer from low, cushioned couch with [facility] and supervision. Patient made mod I in room; explained precautions and that if she requires assistance to still call for RN. Patient verbalized understanding. Patient left sitting in wheelchair with all needs within reach.  PM  Session: Patient received sitting in wheelchair. Session focused on functional mobility and evaluation of goals in preparation for discharge. Patient able to verbalize indications vs. Contraindications for attempting floor transfer vs. Calling EMS s/p fall at home. Patient performed floor transfer standing with B UE support>half kneeling with B UE support>tall kneeling with B UE support>R side sitting>R sidelying>supine>R sidelying>quadruped>tall keeling with B UE support>half kneeling with B UE support>standing with B UE support>sitting edge of mat. Patient requires supervision for activity for proper sequencing and technique; Requires verbal cues for proper LE to lead with from tall kneeling to half kneeling.   Provided patient with HEP handout and reviewed with her. Patient without any other concerns at this time. Patient left seated in wheelchair with all needs within reach.  PT Discharge Precautions/Restrictions Precautions Precautions: None Restrictions Weight Bearing Restrictions: No Pain Pain Assessment Pain Assessment: No/denies pain Pain Score: 0-No pain Vision/Perception  Vision - History Baseline Vision: Wears glasses only for reading Visual History: Cataracts (R eye- not yet repaired) Patient Visual Report: No change from baseline  Cognition Overall Cognitive Status: Within Functional Limits for tasks assessed Arousal/Alertness: Awake/alert Orientation Level: Oriented X4 Memory: Appears intact Awareness: Appears intact Safety/Judgment: Appears intact Sensation Sensation Light Touch: Appears Intact Proprioception: Appears Intact Additional Comments: Proprioception intact at B ankles and B great toes Coordination Gross Motor Movements are Fluid and Coordinated: No (L UE/LE limited) Fine Motor Movements are Fluid and Coordinated: No (L UE/LE limited) Coordination and Movement Description: Decreased speed and accuracy with rapid, alternating movements of LEs; unable to test  with UEs secondary to decreased strength in L UE Heel Shin Test: Slight dysmetria on L LE Motor  Motor Motor: Hemiplegia;Abnormal tone Motor - Discharge Observations: Mild hemiparesis L side  Mobility Bed Mobility Bed Mobility: Supine to Sit;Sit  to Supine Supine to Sit: HOB flat;6: Modified independent (Device/Increase time) Sit to Supine: HOB flat;6: Modified independent (Device/Increase time) Transfers Transfers: Yes Sit to Stand: 6: Modified independent (Device/Increase time);With armrests;From chair/3-in-1;From bed;With upper extremity assist Stand to Sit: 6: Modified independent (Device/Increase time);To chair/3-in-1;With armrests;To bed;With upper extremity assist Stand Pivot Transfers: 6: Modified independent (Device/Increase time);With armrests Locomotion  Ambulation Ambulation: Yes Ambulation/Gait Assistance: 6: Modified independent (Device/Increase time) Ambulation Distance (Feet): 175 Feet Assistive device: Large base quad cane Ambulation/Gait Assistance Details: L AFO donned for ambulation. Ambulation 175' x4 (x2 in AM, x2 in PM) in community environment (busy hospital hallways requiring negotiation of people and obstacles) with SBQC and mod I, 30'x2 in home environment (on carpet, negotiating thresholds, etc.) with SBQC and mod I. Gait Gait: Yes Gait Pattern: Impaired Gait Pattern: Step-through pattern;Decreased step length - right;Decreased stance time - left;Decreased stride length;Decreased hip/knee flexion - left;Narrow base of support;Decreased weight shift to left Stairs / Additional Locomotion Stairs: Yes Stairs Assistance: 5: Supervision Stairs Assistance Details: Verbal cues for precautions/safety;Verbal cues for sequencing;Verbal cues for technique Stairs Assistance Details (indicate cue type and reason): Patient ascended 5 steps (ascends forwards, descends backwards) Stair Management Technique: One rail Right;Step to pattern;Backwards;Forwards Number of  Stairs: 5 Height of Stairs: 9.5 Wheelchair Mobility Wheelchair Mobility: Yes Wheelchair Assistance: 5: Financial planner Details: Verbal cues for Engineer, drilling: Right upper extremity;Right lower extremity Wheelchair Parts Management: Supervision/cueing Distance: 150  Trunk/Postural Assessment  Cervical Assessment Cervical Assessment: Within Functional Limits Thoracic Assessment Thoracic Assessment: Within Functional Limits Lumbar Assessment Lumbar Assessment: Within Functional Limits Postural Control Postural Control: Within Functional Limits  Balance Balance Balance Assessed: Yes Standardized Balance Assessment Standardized Balance Assessment: Berg Balance Test Berg Balance Test Sit to Stand: Able to stand without using hands and stabilize independently Standing Unsupported: Able to stand safely 2 minutes Sitting with Back Unsupported but Feet Supported on Floor or Stool: Able to sit safely and securely 2 minutes Stand to Sit: Sits safely with minimal use of hands Transfers: Able to transfer safely, definite need of hands Standing Unsupported with Eyes Closed: Able to stand 10 seconds safely Standing Ubsupported with Feet Together: Able to place feet together independently and stand 1 minute safely From Standing, Reach Forward with Outstretched Arm: Can reach forward >12 cm safely (5") From Standing Position, Pick up Object from Floor: Able to pick up shoe safely and easily From Standing Position, Turn to Look Behind Over each Shoulder: Looks behind from both sides and weight shifts well Turn 360 Degrees: Able to turn 360 degrees safely but slowly Standing Unsupported, Alternately Place Feet on Step/Stool: Able to complete >2 steps/needs minimal assist Standing Unsupported, One Foot in Front: Able to take small step independently and hold 30 seconds Standing on One Leg: Able to lift leg independently and hold 5-10 seconds Total Score:  46/56, indicating patient is at a moderate risk for falls (>50%)   Static Sitting Balance Static Sitting - Balance Support: Feet supported;No upper extremity supported Static Sitting - Level of Assistance: 6: Modified independent (Device/Increase time) Static Standing Balance Static Standing - Balance Support: During functional activity;Right upper extremity supported;No upper extremity supported Static Standing - Level of Assistance: 6: Modified independent (Device/Increase time) Dynamic Standing Balance Dynamic Standing - Balance Support: Right upper extremity supported Dynamic Standing - Level of Assistance: 6: Modified independent (Device/Increase time) Extremity Assessment  RLE Assessment RLE Assessment: Within Functional Limits (Grossly 4+/5) LLE Assessment LLE Assessment: Exceptions to [facility] LLE Strength LLE Overall Strength:  Deficits LLE Overall Strength Comments: Grossly 2+/5; 3-/5 knee extension, 2-/5 PF/DF  See FIM for current functional status  Wyman Meschke S Tawna Alwin S. Agusta Hackenberg, PT, DPT 11/04/2013, 12:14 PM

## 2013-11-04 NOTE — Progress Notes (Signed)
Subjective/Complaints: L hip still sore. Pain is lateral on hip. Pleased with progress No bowel or bladder complaints. No bring problems. Review of Systems - Negative except L side weak  Objective: Vital Signs: Blood pressure 145/71, pulse 60, temperature 98.3 F (36.8 C), temperature source Oral, resp. rate 17, height 5\' 8"  (1.727 m), weight 76.295 kg (168 lb 3.2 oz), SpO2 98.00%. No results found. Results for orders placed during the hospital encounter of 10/26/13 (from the past 72 hour(s))  GLUCOSE, CAPILLARY     Status: Abnormal   Collection Time    11/01/13 11:14 AM      Result Value Range   Glucose-Capillary 106 (*) 70 - 99 mg/dL   Comment 1 Notify RN    GLUCOSE, CAPILLARY     Status: Abnormal   Collection Time    11/01/13  4:48 PM      Result Value Range   Glucose-Capillary 154 (*) 70 - 99 mg/dL  GLUCOSE, CAPILLARY     Status: Abnormal   Collection Time    11/01/13  8:49 PM      Result Value Range   Glucose-Capillary 105 (*) 70 - 99 mg/dL  BASIC METABOLIC PANEL     Status: Abnormal   Collection Time    11/02/13  5:20 AM      Result Value Range   Sodium 140  135 - 145 mEq/L   Potassium 4.0  3.5 - 5.1 mEq/L   Chloride 105  96 - 112 mEq/L   CO2 22  19 - 32 mEq/L   Glucose, Bld 94  70 - 99 mg/dL   BUN 25 (*) 6 - 23 mg/dL   Creatinine, Ser 1.61  0.50 - 1.10 mg/dL   Calcium 9.2  8.4 - 09.6 mg/dL   GFR calc non Af Amer 88 (*) >90 mL/min   GFR calc Af Amer >90  >90 mL/min   Comment: (NOTE)     The eGFR has been calculated using the CKD EPI equation.     This calculation has not been validated in all clinical situations.     eGFR's persistently <90 mL/min signify possible Chronic Kidney     Disease.  GLUCOSE, CAPILLARY     Status: None   Collection Time    11/02/13  7:26 AM      Result Value Range   Glucose-Capillary 85  70 - 99 mg/dL   Comment 1 Notify RN    GLUCOSE, CAPILLARY     Status: Abnormal   Collection Time    11/02/13 11:16 AM      Result Value Range    Glucose-Capillary 170 (*) 70 - 99 mg/dL   Comment 1 Notify RN    GLUCOSE, CAPILLARY     Status: Abnormal   Collection Time    11/02/13  4:57 PM      Result Value Range   Glucose-Capillary 106 (*) 70 - 99 mg/dL  GLUCOSE, CAPILLARY     Status: Abnormal   Collection Time    11/02/13  9:21 PM      Result Value Range   Glucose-Capillary 107 (*) 70 - 99 mg/dL  GLUCOSE, CAPILLARY     Status: None   Collection Time    11/03/13  7:18 AM      Result Value Range   Glucose-Capillary 95  70 - 99 mg/dL   Comment 1 Notify RN    GLUCOSE, CAPILLARY     Status: Abnormal   Collection Time    11/03/13 11:15  AM      Result Value Range   Glucose-Capillary 117 (*) 70 - 99 mg/dL   Comment 1 Notify RN    GLUCOSE, CAPILLARY     Status: Abnormal   Collection Time    11/03/13  4:32 PM      Result Value Range   Glucose-Capillary 154 (*) 70 - 99 mg/dL  GLUCOSE, CAPILLARY     Status: Abnormal   Collection Time    11/03/13  9:29 PM      Result Value Range   Glucose-Capillary 125 (*) 70 - 99 mg/dL  GLUCOSE, CAPILLARY     Status: None   Collection Time    11/04/13  7:31 AM      Result Value Range   Glucose-Capillary 91  70 - 99 mg/dL     HEENT: normal and throat clear Cardio: RRR and no murmur Resp: CTA B/L and unlabored GI: BS positive and non distended Extremity:  Pulses positive and No Edema, L hip No tenderness lateral Skin:   Intact Neuro: Alert/Oriented, Cranial Nerve II-XII normal, Normal Sensory, Abnormal Motor 3-/5 Left bi tri, delt and grip, 4-/5 L HF,KE,2-ADF and Abnormal FMC Ataxic/ dec FMC Musc/Skel:  Point tenderness along left greater troch Gen NAD   Assessment/Plan: 1. Functional deficits secondary to Right pointine infarct with L Hemiparesis which require 3+ hours per day of interdisciplinary therapy in a comprehensive inpatient rehab setting. Physiatrist is providing close team supervision and 24 hour management of active medical problems listed below. Physiatrist and rehab  team continue to assess barriers to discharge/monitor patient progress toward functional and medical goals.  FIM: FIM - Bathing Bathing Steps Patient Completed: Chest;Front perineal area;Right lower leg (including foot);Left lower leg (including foot);Buttocks;Right upper leg;Left upper leg;Abdomen;Left Arm;Right Arm Bathing: 5: Supervision: Safety issues/verbal cues  FIM - Upper Body Dressing/Undressing Upper body dressing/undressing steps patient completed: Thread/unthread right sleeve of pullover shirt/dresss;Pull shirt over trunk;Thread/unthread left sleeve of pullover shirt/dress;Put head through opening of pull over shirt/dress Upper body dressing/undressing: 5: Supervision: Safety issues/verbal cues FIM - Lower Body Dressing/Undressing Lower body dressing/undressing steps patient completed: Thread/unthread right underwear leg;Thread/unthread left underwear leg;Pull underwear up/down;Thread/unthread right pants leg;Thread/unthread left pants leg;Pull pants up/down;Don/Doff left sock;Don/Doff right sock;Don/Doff right shoe;Fasten/unfasten right shoe;Don/Doff left shoe;Fasten/unfasten left shoe Lower body dressing/undressing: 5: Set-up assist to: Don/Doff AFO/prosthesis/orthosis  FIM - Toileting Toileting steps completed by patient: Adjust clothing prior to toileting;Performs perineal hygiene;Adjust clothing after toileting Toileting Assistive Devices: Grab bar or rail for support Toileting: 5: Supervision: Safety issues/verbal cues  FIM - Diplomatic Services operational officer Devices: Grab bars (in community) Toilet Transfers: 5-To toilet/BSC: Supervision (verbal cues/safety issues);5-From toilet/BSC: Supervision (verbal cues/safety issues)  FIM - Press photographer Assistive Devices: Arm rests;Cane Bed/Chair Transfer: 5: Chair or W/C > Bed: Supervision (verbal cues/safety issues);5: Bed > Chair or W/C: Supervision (verbal cues/safety issues)  FIM -  Locomotion: Wheelchair Distance: 150 (50' forwards w/ B LEs, 100' backwards w/ L LE) Locomotion: Wheelchair: 1: Total Assistance/staff pushes wheelchair (Pt<25%) FIM - Locomotion: Ambulation Locomotion: Ambulation Assistive Devices: Occupational hygienist Ambulation/Gait Assistance: 5: Supervision Locomotion: Ambulation: 5: Travels 150 ft or more with supervision/safety issues  Comprehension Comprehension Mode: Auditory Comprehension: 6-Follows complex conversation/direction: With extra time/assistive device  Expression Expression Mode: Verbal Expression: 6-Expresses complex ideas: With extra time/assistive device  Social Interaction Social Interaction: 5-Interacts appropriately 90% of the time - Needs monitoring or encouragement for participation or interaction.  Problem Solving Problem Solving: 6-Solves complex problems: With extra time  Memory Memory: 6-Assistive device: No helper  Medical Problem List and Plan:  1. DVT Prophylaxis/Anticoagulation: Pharmaceutical: Lovenox  2. Pain Management: L Hip pain, troch bursitis-Injection helpful, PT stretching regimen  -ice to left hip 3. Mood: Motivated to get better. Will have LCSW follow for evaluation.  4. Neuropsych: This patient is capable of making decisions on her own behalf.  5 Diabetes: Will monitor with ac/hs checks--use SSI for tighter control. Continue metformin bid.   -reviewed home monitoring 6. HTN: Will monitor with bid checks--avoid hypotension. Was restarted on Prinivil and HCTZ--prinivil dose increased  since systolic BPs still elevated- continue to titrate   LOS (Days) 9 A FACE TO FACE EVALUATION WAS PERFORMED  Lynnsie Linders T 11/04/2013, 8:04 AM

## 2013-11-05 DIAGNOSIS — E1149 Type 2 diabetes mellitus with other diabetic neurological complication: Secondary | ICD-10-CM

## 2013-11-05 DIAGNOSIS — E785 Hyperlipidemia, unspecified: Secondary | ICD-10-CM

## 2013-11-05 LAB — GLUCOSE, CAPILLARY: Glucose-Capillary: 123 mg/dL — ABNORMAL HIGH (ref 70–99)

## 2013-11-05 NOTE — Progress Notes (Signed)
Laurie [name] is a 67 y.o. female 1946/09/01 [id number]  Subjective: No new complaints. No new problems. Slept OK. Ready to go home Objective: Vital signs in last 24 hours: Temp:  [98.1 F (36.7 C)-98.3 F (36.8 C)] 98.1 F (36.7 C) (11/15 0620) Pulse Rate:  [83-86] 86 (11/15 0620) Resp:  [18] 18 (11/15 0620) BP: (150-190)/(81-97) 156/82 mmHg (11/15 0620) SpO2:  [96 %-99 %] 99 % (11/15 0620) Weight change:  Last BM Date: 11/04/13  Intake/Output from previous day: 11/14 0701 - 11/15 0700 In: 840 [P.O.:840] Out: 2 [Urine:1; Stool:1] Last cbgs: CBG (last 3)   Recent Labs  11/04/13 1630 11/04/13 2158 11/05/13 0732  GLUCAP 154* 114* 123*     Physical Exam General: No apparent distress   Eating breakfast  HEENT: moist mucosa Lungs: Normal effort. Lungs clear to auscultation, no crackles or wheezes. Cardiovascular: Regular rate and rhythm, no edema Abdomen: S/NT/ND; BS(+) Musculoskeletal:  No change from before. L lat hip is a little tender Neurological: No new neurological deficits Wounds: N/A    Skin: clear Alert, cooperative   Lab Results: BMET    Component Value Date/Time   NA 140 11/02/2013 0520   K 4.0 11/02/2013 0520   CL 105 11/02/2013 0520   CO2 22 11/02/2013 0520   GLUCOSE 94 11/02/2013 0520   BUN 25* 11/02/2013 0520   CREATININE 0.73 11/02/2013 0520   CALCIUM 9.2 11/02/2013 0520   GFRNONAA 88* 11/02/2013 0520   GFRAA >90 11/02/2013 0520   CBC    Component Value Date/Time   WBC 7.6 10/27/2013 0334   RBC 3.89 10/27/2013 0334   HGB 12.5 10/27/2013 0334   HCT 35.8* 10/27/2013 0334   PLT 256 10/27/2013 0334   MCV 92.0 10/27/2013 0334   MCH 32.1 10/27/2013 0334   MCHC 34.9 10/27/2013 0334   RDW 12.6 10/27/2013 0334   LYMPHSABS 3.1 10/27/2013 0334   MONOABS 0.6 10/27/2013 0334   EOSABS 0.2 10/27/2013 0334   BASOSABS 0.0 10/27/2013 0334    Studies/Results: No results found.  Medications: I have reviewed the patient's current  medications.  Assessment/Plan:  1. DVT Prophylaxis/Anticoagulation: Pharmaceutical: Lovenox  2. Pain Management: n/a  3. Mood: Motivated to get better. Will have LCSW follow for evaluation.  4. Neuropsych: This patient is capable of making decisions on her own behalf.  5 Diabetes: Will monitor with ac/hs checks--use SSI for tighter control. Continue metformin bid.  6. HTN: Will need fine tuning w/her PCP.  7. L lateral hip pain - ?bursitis. Pain meds. Resolved  D/c home     Length of stay, days: 10  Sonda Primes , MD 11/05/2013, 8:19 AM

## 2013-11-05 NOTE — Progress Notes (Signed)
Patient discharged today. Niece came to pick her up. Left via wheelchair with Melissa, NT.

## 2013-12-02 ENCOUNTER — Encounter: Payer: Self-pay | Admitting: Physical Medicine & Rehabilitation

## 2013-12-02 ENCOUNTER — Encounter: Payer: Medicare Other | Attending: Physical Medicine & Rehabilitation

## 2013-12-02 ENCOUNTER — Ambulatory Visit (HOSPITAL_BASED_OUTPATIENT_CLINIC_OR_DEPARTMENT_OTHER): Payer: Medicare Other | Admitting: Physical Medicine & Rehabilitation

## 2013-12-02 VITALS — BP 154/108 | HR 88 | Resp 16 | Ht 68.0 in | Wt 176.0 lb

## 2013-12-02 DIAGNOSIS — I6350 Cerebral infarction due to unspecified occlusion or stenosis of unspecified cerebral artery: Secondary | ICD-10-CM

## 2013-12-02 DIAGNOSIS — M79609 Pain in unspecified limb: Secondary | ICD-10-CM | POA: Insufficient documentation

## 2013-12-02 DIAGNOSIS — M1610 Unilateral primary osteoarthritis, unspecified hip: Secondary | ICD-10-CM | POA: Insufficient documentation

## 2013-12-02 DIAGNOSIS — F17200 Nicotine dependence, unspecified, uncomplicated: Secondary | ICD-10-CM | POA: Insufficient documentation

## 2013-12-02 DIAGNOSIS — E119 Type 2 diabetes mellitus without complications: Secondary | ICD-10-CM | POA: Insufficient documentation

## 2013-12-02 DIAGNOSIS — I10 Essential (primary) hypertension: Secondary | ICD-10-CM | POA: Insufficient documentation

## 2013-12-02 DIAGNOSIS — I6990 Unspecified sequelae of unspecified cerebrovascular disease: Secondary | ICD-10-CM | POA: Insufficient documentation

## 2013-12-02 DIAGNOSIS — G8110 Spastic hemiplegia affecting unspecified side: Secondary | ICD-10-CM

## 2013-12-02 MED ORDER — TRAMADOL HCL 50 MG PO TABS: 50.0000 mg | ORAL_TABLET | Freq: Three times a day (TID) | ORAL | Status: DC

## 2013-12-02 NOTE — Patient Instructions (Signed)
Cont Therapy No driving until you see Dr Pearlean Brownie and discuss You have a refill of all your meds.  This should be enough until you see your new PCP Increased Tramadol to 3 times a day

## 2013-12-02 NOTE — Progress Notes (Signed)
Subjective:    Patient ID: Laurie [name], female    DOB: 1946/03/16, 67 y.o.   MRN: [id number] Laurie [name] is a 67 y.o. right-handed female with history of hypertension as well as diabetes mellitus, HTN and tobacco abuse who was admitted 10/24/2013 with left-sided weakness and slurred speech. Patient with history of medication non-compliance as hasn't taken any medications X years. Cranial CT scan was negative. MRI of the brain showed acute nonhemorrhagic infarct anterior right pons as well as remote lacunar infarct posterior right lentiform nucleus. 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 Echocardiogram with ejection fraction of 60% grade 2 diastolic dysfunction. Carotid Dopplers without significant ICA stenosis. Neurology recommends ASA for secondary stroke prevention of thrombotic stroke due to SVD  HPI LLE > LUE burning pain worse in the morning Pain Inventory Average Pain 10 Pain Right Now 8 My pain is sharp, burning, stabbing and aching  In the last 24 hours, has pain interfered with the following? General activity 6 Relation with others 6 Enjoyment of life 4 What TIME of day is your pain at its worst? morning, night Sleep (in general) Fair  Pain is worse with: walking, sitting, standing and some activites Pain improves with: rest, heat/ice, therapy/exercise and medication Relief from Meds: 5  Mobility walk without assistance use a cane how many minutes can you walk? 30 ability to climb steps?  yes do you drive?  yes Do you have any goals in this area?  yes  Function disabled: date disabled 08/2010 I need assistance with the following:  dressing, meal prep and household duties  Neuro/Psych weakness numbness tingling trouble walking confusion depression anxiety  Prior Studies Any changes since last visit?  no  Physicians involved in your care Any changes since last visit?   no   Family History  Problem Relation Age of Onset  . CAD Mother   . CAD Father    History   Social History  . Marital Status: Divorced    Spouse Name: N/A    Number of Children: N/A  . Years of Education: N/A   Social History Main Topics  . Smoking status: Current Every Day Smoker  . Smokeless tobacco: None  . Alcohol Use: No  . Drug Use: No  . Sexual Activity: None   Other Topics Concern  . None   Social History Narrative   She lives with her sister and they do not have a good relationship.   History reviewed. No pertinent past surgical history. Past Medical History  Diagnosis Date  . Hypertension   . Diabetes mellitus   . Chest pain     Troponins up to 0.12 while hospitalized for E.coli PNA  . 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 Bacteremia   . Anemia   . Narcolepsy   . Stroke    BP 154/108  Pulse 88  Resp 16  Ht 5\' 8"  (1.727 m)  Wt 176 lb (79.833 kg)  BMI 26.77 kg/m2  SpO2 98%     Review of Systems  Musculoskeletal: Positive for gait problem.  Neurological: Positive for weakness and numbness.  Psychiatric/Behavioral: Positive for confusion and dysphoric mood. The patient is nervous/anxious.   All other systems reviewed and are negative.       Objective:   Physical Exam  Nursing note and vitals reviewed. Constitutional: She appears well-developed and well-nourished.  HENT:  Head: Normocephalic and atraumatic.  Eyes: Conjunctivae and EOM are normal. Pupils are equal, round, and reactive to light.  Neck: Normal range of motion.  Neurological:  4/5 L delt, bi, tr, 3-/5 grip 4/5 HF, KE, 2-/5 in Ankle  Psychiatric: She has a normal mood and affect.          Assessment & Plan:  1.  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 Has completed inpatient rehabilitation, now is getting home health rehabilitation. I think she is ready to progress to outpatient therapy. Will order PT OT  2. Risk factors for stroke hypertension  and diabetes. Needs close followup with primary care position. Will establish with a new primary care physician next week.  3. Left lower extremity pain history of trochanteric bursitis. She states she's also been told that she has hip arthritis. Hip films from 2010 reviewed. 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 Will continue PT. Had injection while in hospital we'll need to wait about another 2 months prior to reinjection if needed. Increase tramadol 50 mg 3 times a day

## 2013-12-07 ENCOUNTER — Telehealth: Payer: Self-pay

## 2013-12-07 NOTE — Telephone Encounter (Signed)
Laurie [name] that patient should start out patient therapy.  Laurie [name] is going to continue care for a week to prevent any lapse of therapy.

## 2013-12-07 NOTE — Telephone Encounter (Signed)
Erin a physical therapist with [facility] called to get order to extend home health.

## 2013-12-07 NOTE — Telephone Encounter (Signed)
I have ordered outpt therapy instead

## 2013-12-16 ENCOUNTER — Telehealth: Payer: Self-pay

## 2013-12-16 DIAGNOSIS — I6350 Cerebral infarction due to unspecified occlusion or stenosis of unspecified cerebral artery: Secondary | ICD-10-CM

## 2013-12-16 DIAGNOSIS — G8110 Spastic hemiplegia affecting unspecified side: Secondary | ICD-10-CM

## 2013-12-16 NOTE — Telephone Encounter (Signed)
Per Zella Ball at [facility] patient is being discharged from therapy and needs orders to be sent to outpatient therapy.

## 2013-12-19 NOTE — Telephone Encounter (Signed)
My last note indicated that I ordered outpt PT/OT.  Please order if this was not already done/

## 2013-12-19 NOTE — Telephone Encounter (Signed)
Occupational therapy order placed.

## 2013-12-29 ENCOUNTER — Ambulatory Visit: Payer: Medicare Other | Admitting: Occupational Therapy

## 2013-12-29 ENCOUNTER — Ambulatory Visit: Payer: Medicare Other | Attending: Physical Medicine & Rehabilitation

## 2013-12-29 DIAGNOSIS — I69959 Hemiplegia and hemiparesis following unspecified cerebrovascular disease affecting unspecified side: Secondary | ICD-10-CM | POA: Insufficient documentation

## 2013-12-29 DIAGNOSIS — R269 Unspecified abnormalities of gait and mobility: Secondary | ICD-10-CM | POA: Insufficient documentation

## 2013-12-29 DIAGNOSIS — Z5189 Encounter for other specified aftercare: Secondary | ICD-10-CM | POA: Insufficient documentation

## 2013-12-29 DIAGNOSIS — M25519 Pain in unspecified shoulder: Secondary | ICD-10-CM | POA: Insufficient documentation

## 2013-12-29 DIAGNOSIS — M6281 Muscle weakness (generalized): Secondary | ICD-10-CM | POA: Insufficient documentation

## 2013-12-29 DIAGNOSIS — R279 Unspecified lack of coordination: Secondary | ICD-10-CM | POA: Insufficient documentation

## 2014-01-06 ENCOUNTER — Ambulatory Visit: Payer: Medicare Other | Admitting: Physical Medicine & Rehabilitation

## 2014-01-10 ENCOUNTER — Ambulatory Visit (HOSPITAL_BASED_OUTPATIENT_CLINIC_OR_DEPARTMENT_OTHER): Payer: Medicare Other | Admitting: Physical Medicine & Rehabilitation

## 2014-01-10 ENCOUNTER — Encounter: Payer: Medicare Other | Attending: Physical Medicine & Rehabilitation

## 2014-01-10 ENCOUNTER — Encounter: Payer: Self-pay | Admitting: Physical Medicine & Rehabilitation

## 2014-01-10 VITALS — BP 156/99 | HR 90 | Resp 14 | Ht 67.5 in | Wt 172.0 lb

## 2014-01-10 DIAGNOSIS — M79609 Pain in unspecified limb: Secondary | ICD-10-CM | POA: Insufficient documentation

## 2014-01-10 DIAGNOSIS — G8110 Spastic hemiplegia affecting unspecified side: Secondary | ICD-10-CM

## 2014-01-10 DIAGNOSIS — M76899 Other specified enthesopathies of unspecified lower limb, excluding foot: Secondary | ICD-10-CM

## 2014-01-10 DIAGNOSIS — I6990 Unspecified sequelae of unspecified cerebrovascular disease: Secondary | ICD-10-CM | POA: Insufficient documentation

## 2014-01-10 DIAGNOSIS — M7062 Trochanteric bursitis, left hip: Secondary | ICD-10-CM

## 2014-01-10 DIAGNOSIS — M1610 Unilateral primary osteoarthritis, unspecified hip: Secondary | ICD-10-CM | POA: Insufficient documentation

## 2014-01-10 DIAGNOSIS — I10 Essential (primary) hypertension: Secondary | ICD-10-CM | POA: Insufficient documentation

## 2014-01-10 DIAGNOSIS — E119 Type 2 diabetes mellitus without complications: Secondary | ICD-10-CM | POA: Insufficient documentation

## 2014-01-10 DIAGNOSIS — F17200 Nicotine dependence, unspecified, uncomplicated: Secondary | ICD-10-CM | POA: Insufficient documentation

## 2014-01-10 MED ORDER — GABAPENTIN 100 MG PO CAPS: 100.0000 mg | ORAL_CAPSULE | Freq: Two times a day (BID) | ORAL | Status: DC

## 2014-01-10 MED ORDER — TRAMADOL HCL 50 MG PO TABS: 50.0000 mg | ORAL_TABLET | Freq: Three times a day (TID) | ORAL | Status: DC

## 2014-01-10 NOTE — Patient Instructions (Signed)
May need left shoulder injection next visit  Left hip bursa injection, may cause temporary increase in blood sugars Celestone Lidocaine
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
## 2014-01-16 ENCOUNTER — Ambulatory Visit: Payer: Medicare Other | Admitting: Rehabilitative and Restorative Service Providers"

## 2014-01-16 ENCOUNTER — Ambulatory Visit: Payer: Medicare Other | Admitting: Occupational Therapy

## 2014-01-16 ENCOUNTER — Encounter: Payer: Medicare Other | Admitting: Occupational Therapy

## 2014-01-17 ENCOUNTER — Ambulatory Visit: Payer: Medicare Other | Admitting: Rehabilitative and Restorative Service Providers"

## 2014-01-19 ENCOUNTER — Ambulatory Visit (INDEPENDENT_AMBULATORY_CARE_PROVIDER_SITE_OTHER): Payer: Medicare Other | Admitting: Neurology

## 2014-01-19 ENCOUNTER — Encounter: Payer: Self-pay | Admitting: Neurology

## 2014-01-19 VITALS — BP 148/72 | HR 83 | Ht 66.0 in | Wt 177.0 lb

## 2014-01-19 DIAGNOSIS — G47419 Narcolepsy without cataplexy: Secondary | ICD-10-CM

## 2014-01-19 NOTE — Patient Instructions (Signed)
I had a long discussion the patient with regards to her stroke, discuss results of evaluation in the hospital, risk reduction strategies and answered questions. Continue aspirin for stroke prevention with strict control of hypertension with blood pressure goal below 130/90 and lipids with LDL cholesterol goal below 1000 mg percent. Referred to Dr. Frances FurbishAthar for narcolepsy management. Followup in the future with me as needed only.

## 2014-01-19 NOTE — Progress Notes (Signed)
[facility] [address] [place] [id number] [phone]       OFFICE FOLLOW-UP NOTE  Laurie [name] [place] Date of Birth:  1946/01/11 Medical Record Number:  [id number]   HPI: [age] year African American lady seen for first office followup visit for hospital admission for stroke on 10/26/2013. She presented with sudden onset left sided weakness on 10/24/13 and slurred speech with NIH stroke scale of 4.. CT scan that are unremarkable. She presented beyond timing of intervention. MRI scan of the brain showed acute nonhemorrhagic right pontine infarct as well as remote age lacunar infarcts in right lentiform nucleus. 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 Echocardiogram showed normal ejection fraction. Carotid Doppler showed no significant extraction stenosis. Patient was started on aspirin for stroke prevention and advised to be compliant with medications for her diabetes, blood pressure and cholesterol. She was transferred to inpatient rehabilitation where she did well and has been discharged home. She is now able to walk with a cane but states that she can walk short distances without it. She still has some weakness of the left grip and intrinsic hand muscles as well as tracks left leg while walking. She has some mild swelling of the left hand and feet which seems bothersome. She is currently seeing Dr. Senaida OresKerstein's from rehabilitation for hip and shoulder pain who plans to do Botox injections for spasticity soon. She has a diagnosis of narcolepsy since 1997 and has tried Adderall in the past but would like to see somebody to consider new treatment options.  ROS:   14 system review of systems is positive for neck pain, constipation, leg swelling, joint pain and swelling, back pain, aching muscles, muscle cramps, walking difficulty, coordination problems, weakness and numbness. All other systems negative.  PMH:  Past Medical History  Diagnosis Date  .  Hypertension   . Diabetes mellitus   . Chest pain     Troponins up to 0.12 while hospitalized for E.coli PNA  . 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 Bacteremia   . Anemia   . Narcolepsy   . Stroke     Social History:  History   Social History  . Marital Status: Divorced    Spouse Name: N/A    Number of Children: N/A  . Years of Education: N/A   Occupational History  . Not on file.   Social History Main Topics  . Smoking status: Current Every Day Smoker  . Smokeless tobacco: Not on file  . Alcohol Use: No  . Drug Use: No  . Sexual Activity: Not on file   Other Topics Concern  . Not on file   Social History Narrative   She lives with her sister and they do not have a good relationship.    Medications:   Current Outpatient Prescriptions on File Prior to Visit  Medication Sig Dispense Refill  . aspirin 325 MG tablet Take 1 tablet (325 mg total) by mouth daily.      Marland Kitchen. atorvastatin (LIPITOR) 10 MG tablet Take 1 tablet (10 mg total) by mouth daily at 6 PM.  30 tablet  1  . co-enzyme Q-10 30 MG capsule Take 100 mg by mouth 2 (two) times daily.      Marland Kitchen. gabapentin (NEURONTIN) 100 MG capsule Take 1 capsule (100 mg total) by mouth 2 (two) times daily.  60 capsule  5  . hydrochlorothiazide (MICROZIDE) 12.5 MG capsule  Take 1 capsule (12.5 mg total) by mouth daily.  30 capsule  1  . lisinopril (PRINIVIL,ZESTRIL) 10 MG tablet Take 1 tablet (10 mg total) by mouth 2 (two) times daily. For blood pressure  60 tablet  1  . metFORMIN (GLUCOPHAGE) 500 MG tablet Take 1 tablet (500 mg total) by mouth 2 (two) times daily with a meal. For diabetes  60 tablet  1  . traMADol (ULTRAM) 50 MG tablet Take 1 tablet (50 mg total) by mouth 3 (three) times daily.  90 tablet  5   No current facility-administered medications on file prior to visit.    Allergies:   Allergies  Allergen Reactions  . Penicillins     Physical Exam General: well developed, well nourished, seated, in  no evident distress Head: head normocephalic and atraumatic. Orohparynx benign Neck: supple with no carotid or supraclavicular bruits Cardiovascular: regular rate and rhythm, no murmurs Musculoskeletal: no deformity Skin:  no rash/petichiae Vascular:  Normal pulses all extremities Filed Vitals:   01/19/14 1503  BP: 148/72  Pulse: 83   Neurologic Exam Mental Status: Awake and fully alert. Oriented to place and time. Recent and remote memory intact. Attention span, concentration and fund of knowledge appropriate. Mood and affect appropriate.  Cranial Nerves: Fundoscopic exam reveals sharp disc margins. Pupils equal, briskly reactive to light. Extraocular movements full without nystagmus. Visual fields full to confrontation. Hearing intact. Facial sensation intact.Mild left  Face weakness., Tongue, palate moves normally and symmetrically.  Motor: Normal bulk and tone. Normal strength in all tested extremity muscles on the right. Mild left hemiparesis grade 4/5 with weakness of the left grip and intrinsic hand muscles as well as left ankle dorsiflexors and hip flexors. Increased tone on the left side.. Sensory.: intact to touch and pinprick and vibratory sensation.  Coordination: Rapid alternating movements normal in all extremities. Finger-to-nose and heel-to-shin performed accurately bilaterally. Gait and Station: Arises from chair without difficulty. Stance is normal. Gait mild spastic hemiparetic gait with dragging of the left leg   Reflexes: 1+ and symmetric. Toes downgoing.   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 History of narcolepsy.    PLAN:  I had a long discussion the patient with regards to her stroke, discuss results of evaluation in the hospital, risk reduction strategies and answered  questions. Continue aspirin for stroke prevention with strict control of hypertension with blood pressure goal below 130/90 and lipids with LDL cholesterol goal below 1000 mg percent. Referred to Dr. Frances Furbish for narcolepsy management. Followup in the future with me as needed only.    Note: This document was prepared with digital dictation and possible smart phrase technology. Any transcriptional errors that result from this process are unintentional

## 2014-01-26 ENCOUNTER — Ambulatory Visit: Payer: Medicare Other | Attending: Physical Medicine & Rehabilitation | Admitting: Physical Therapy

## 2014-01-26 DIAGNOSIS — Z5189 Encounter for other specified aftercare: Secondary | ICD-10-CM | POA: Insufficient documentation

## 2014-01-26 DIAGNOSIS — R279 Unspecified lack of coordination: Secondary | ICD-10-CM | POA: Insufficient documentation

## 2014-01-26 DIAGNOSIS — R269 Unspecified abnormalities of gait and mobility: Secondary | ICD-10-CM | POA: Insufficient documentation

## 2014-01-26 DIAGNOSIS — M25519 Pain in unspecified shoulder: Secondary | ICD-10-CM | POA: Insufficient documentation

## 2014-01-26 DIAGNOSIS — I69959 Hemiplegia and hemiparesis following unspecified cerebrovascular disease affecting unspecified side: Secondary | ICD-10-CM | POA: Insufficient documentation

## 2014-01-26 DIAGNOSIS — M6281 Muscle weakness (generalized): Secondary | ICD-10-CM | POA: Insufficient documentation

## 2014-01-27 ENCOUNTER — Ambulatory Visit: Payer: Medicare Other | Admitting: Physical Therapy

## 2014-02-02 ENCOUNTER — Encounter: Payer: Self-pay | Admitting: Neurology

## 2014-02-02 ENCOUNTER — Ambulatory Visit (INDEPENDENT_AMBULATORY_CARE_PROVIDER_SITE_OTHER): Payer: Medicare Other | Admitting: Neurology

## 2014-02-02 ENCOUNTER — Ambulatory Visit: Payer: Medicare Other | Admitting: Rehabilitative and Restorative Service Providers"

## 2014-02-02 ENCOUNTER — Ambulatory Visit: Payer: Medicare Other | Admitting: *Deleted

## 2014-02-02 VITALS — BP 168/79 | HR 79 | Temp 96.7°F | Ht 66.0 in | Wt 175.0 lb

## 2014-02-02 DIAGNOSIS — E119 Type 2 diabetes mellitus without complications: Secondary | ICD-10-CM

## 2014-02-02 DIAGNOSIS — G47411 Narcolepsy with cataplexy: Secondary | ICD-10-CM

## 2014-02-02 DIAGNOSIS — I639 Cerebral infarction, unspecified: Secondary | ICD-10-CM

## 2014-02-02 DIAGNOSIS — I6350 Cerebral infarction due to unspecified occlusion or stenosis of unspecified cerebral artery: Secondary | ICD-10-CM

## 2014-02-02 DIAGNOSIS — I5032 Chronic diastolic (congestive) heart failure: Secondary | ICD-10-CM

## 2014-02-02 DIAGNOSIS — I10 Essential (primary) hypertension: Secondary | ICD-10-CM

## 2014-02-02 NOTE — Progress Notes (Signed)
Subjective:    Patient ID: Laurie [name] is a 68 y.o. female.  HPI    Laurie Foley, MD, PhD [facility] [address] [address] [place], [place] [address]  Dear Laurie [name],   I saw your patient, Laurie [name], upon your kind request in my clinic today for initial consultation of her prior diagnosis of narcolepsy. The patient is unaccompanied today. As you know, Laurie [name] is a very pleasant 68 year old left-handed woman with an underlying medical history of hyperlipidemia, arthritis, anemia, anxiety, hypertension, type 2 diabetes and stroke in November 2014 at which time she presented with left-sided weakness and slurring of speech (MRI brain showed acute right pontine infarct as well as remote age lacunar infarcts in right lentiform nucleus), who was diagnosed with narcolepsy in 1997. She was previously tried on Adderall but has not been on any medication for this in the past several years. Adderall worked well, but she could not afford it any longer after her insurance changed. She had tried Ritalin, but it caused SEs. She has a Hx of mild cataplexy with laughing, but this is infrequent and rather mild. She sleeps with disruption at night and dreams vividly. She has had nightmares. She has probable hypnopompic hallucinations, with vivid images in her mind and very vivid dreams, which seem real with tactile effects on her. 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 Symptoms date back to '93 and she was diagnosed in '97. She used to see Dr. Maple Hudson. She had seen Dr. Dennie Fetters in our office years ago. Her ESS is 15/24 today. Since her stroke, she has had improvement in her weakness and uses no cane. She has not fallen.  She goes to bed around 11 PM. She wakes around 8 AM. She takes no scheduled naps, but does fall asleep during the day. She has driven one time since the stroke.  She consumes  multiple caffeinated beverages per day, usually in the form of 2  cups of coffee in the AM, and sweet tea all day long.  She quit smoking after the stroke.   Her Past Medical History Is Significant For: Past Medical History  Diagnosis Date  . Hypertension   . Diabetes mellitus   . Chest pain     Troponins up to 0.12 while hospitalized for E.coli PNA  . 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 Bacteremia   . Anemia   . Narcolepsy   . Stroke     Her Past Surgical History Is Significant For: History reviewed. No pertinent past surgical history.  Her Family History Is Significant For: Family History  Problem Relation Age of Onset  . CAD Mother   . CAD Father     Her Social History Is Significant For: History   Social History  . Marital Status: Divorced    Spouse Name: N/A    Number of Children: N/A  . Years of Education: N/A   Social History Main Topics  . Smoking status: Current Every Day Smoker  . Smokeless tobacco: None  . Alcohol Use: No  . Drug Use: No  . Sexual Activity: None   Other Topics Concern  . None   Social History Narrative   She lives with her sister and they do not have a good relationship.    Her Allergies Are:  Allergies  Allergen Reactions  . Penicillins   :   Her Current Medications Are:  Outpatient Encounter Prescriptions as of 02/02/2014  Medication Sig  . aspirin 325 MG tablet Take 1 tablet (325 mg total) by mouth daily.  Marland Kitchen atorvastatin (LIPITOR) 10 MG tablet Take 1 tablet (10 mg total) by mouth daily at 6 PM.  . co-enzyme Q-10 30 MG capsule Take 100 mg by mouth 2 (two) times daily.  Marland Kitchen gabapentin (NEURONTIN) 100 MG capsule Take 1 capsule (100 mg total) by mouth 2 (two) times daily.  . hydrochlorothiazide (MICROZIDE) 12.5 MG capsule Take 1 capsule (12.5 mg total) by mouth daily.  Marland Kitchen lisinopril (PRINIVIL,ZESTRIL) 10 MG tablet Take 1 tablet (10 mg total) by mouth 2 (two) times daily. For blood pressure  . metFORMIN (GLUCOPHAGE) 500 MG tablet Take 1 tablet (500 mg total) by mouth 2  (two) times daily with a meal. For diabetes  . traMADol (ULTRAM) 50 MG tablet Take 1 tablet (50 mg total) by mouth 3 (three) times daily.  :  Review of Systems:  Out of a complete 14 point review of systems, all are reviewed and negative with the exception of these symptoms as listed below:  Review of Systems  Constitutional: Positive for fatigue.  HENT: Positive for tinnitus.   Eyes: Positive for visual disturbance (blurred vision).  Respiratory: Negative.   Cardiovascular: Positive for leg swelling.  Gastrointestinal: Positive for constipation.  Endocrine: Negative.   Genitourinary: Negative.   Musculoskeletal: Positive for arthralgias, joint swelling and myalgias.  Skin: Negative.   Allergic/Immunologic: Negative.   Neurological: Positive for weakness and numbness.  Hematological: Bruises/bleeds easily.  Psychiatric/Behavioral: Positive for sleep disturbance (insomnia, snoring, restless legs) and dysphoric mood.    Objective:  Neurologic Exam  Physical Exam Physical Examination:   Filed Vitals:   02/02/14 0907  BP: 168/79  Pulse: 79  Temp: 96.7 F (35.9 C)    General Examination: The patient is a very pleasant 68 y.o. female in no acute distress. She appears well-developed and well-nourished and adequately groomed.   HEENT: Normocephalic, atraumatic, pupils are equal, round and reactive to light and accommodation. Funduscopic exam is normal with sharp disc margins noted. Extraocular tracking is good without limitation to gaze excursion or nystagmus noted. Normal smooth pursuit is noted. Hearing is grossly intact. Tympanic membranes are clear bilaterally. Face is slightly asymmetric with L slight NLF depression and normal facial sensation. Speech is fairly clear with minimal dysarthria noted. There is no hypophonia. There is no lip, neck/head, jaw or voice tremor. Neck is supple with full range of passive and active motion. There are no carotid bruits on auscultation.  Oropharynx exam reveals: mild mouth dryness, dentures in the upper, no teeth in the lower jaw, and moderate airway crowding, due to larger tongue and redundant airway. Mallampati is class II. Tongue protrudes centrally and palate elevates symmetrically. Tonsils are 1+.   Chest: Clear to auscultation without wheezing, rhonchi or crackles noted.  Heart: S1+S2+0, regular and normal without murmurs, rubs or gallops noted.   Abdomen: Soft, non-tender and non-distended with normal bowel sounds appreciated on auscultation.  Extremities: There is trace pitting edema in the L distal lower extremity. Pedal pulses are intact.  Skin: Warm and dry without trophic changes noted. There are no varicose veins.  Musculoskeletal: exam reveals no obvious joint deformities, tenderness or joint swelling or erythema, which the exception of swelling of L hand and L wrist (since the stroke).    Neurologically:  Mental status: The patient is awake, alert and oriented in all 4 spheres. Her immediate and remote memory, attention,  language skills and fund of knowledge are appropriate. There is no evidence of aphasia, agnosia, apraxia or anomia. Speech is clear with normal prosody and enunciation. Thought process is linear. Mood is normal and affect is normal.  Cranial nerves II - XII are as described above under HEENT exam. In addition: shoulder shrug is normal with equal shoulder height noted. Motor exam: Normal bulk, strength and tone is noted, with the exception of increase in tone in the LUE and 4/5 weakness on the L with 3/5 grip strength on the L. There is no drift, tremor or rebound. Romberg is negative. Reflexes are 2+ on the R and 3+ on the L. Babinski: Toes are right side plantar; left side indeterminate. Fine motor skills and coordination: intact with normal finger taps, normal hand movements, normal rapid alternating patting, normal foot taps and normal foot agility.  Cerebellar testing: No dysmetria or intention  tremor on finger to nose testing. Heel to shin is unremarkable bilaterally. There is no truncal or gait ataxia.  Sensory exam: intact to light touch, pinprick, vibration, temperature sense and proprioception in the upper and lower extremities.  Gait, station and balance: She stands with difficulty. No veering to one side is noted. No leaning to one side is noted. Posture is age-appropriate and stance is mildly wide based and her L arm draws up. Gait shows circumduction and L limp. She turns slowly. Tandem walk is unremarkable. Intact toe and heel stance is noted.               Assessment and Plan:   In summary, Laurie [name] is a very pleasant 68 y.o.-year old female with an underlying medical history of hyperlipidemia, arthritis, anemia, anxiety, hypertension, type 2 diabetes and stroke in November 2014 with a history of narcolepsy with cataplexy. We will do a sleep study and a nap study and then re-initiate treatment symptomatically if needed. She had done well with Adderall in the past. Her physical exam is consistent with L sided residual weakness post stroke.   I had a long chat with the patient about my findings and the diagnosis of narcolepsy, the prognosis and treatment options. We talked about medical treatments and non-pharmacological approaches. We talked about maintaining a healthy lifestyle in general. I encouraged the patient to eat healthy, exercise daily and keep well hydrated, to keep a scheduled bedtime and wake time routine, to not skip any meals and eat healthy snacks in between meals and to have protein with every meal. She is advised to reduce caffeine and reduce her sugar intake. She is advised her about secondary stroke prevention. Of note, she snores, but reports no apneas. We will look for signs for OSA as well.   She is currently doing outpatient physical and occupational therapy. I answered all her questions today and the patient was in agreement with the above outlined plan. I  would like to see the patient back after the sleep studies are completed. Thank you very much for allowing me to participate in the care of this nice patient. Sincerely,   Laurie FoleySaima Lasharon Dunivan, MD, PhD

## 2014-02-02 NOTE — Patient Instructions (Addendum)
You have narcolepsy: This means, that you have a sleep disorder that manifests with at times severe excessive sleepiness during the day and problem with sleep at night. We may have to try different medications that may help you stay awake during the day. Not everything works with everybody the same way. Wake promoting agents include stimulants and non-stimulant type medications. The most common side effects with stimulants are weight loss, insomnia, nervousness, headaches, palpitations, rise in blood pressure, anxiety. Stimulants can be addictive and subject to abuse. Non-stimulant type wake promoting medications include Provigil and Nuvigil, most common side effects include headaches, nervousness, insomnia, hypertension. In addition there is a medication called Xyrem which has been proven to be very effective in patients with narcolepsy with or without cataplexy. For cataplexy I suggest no new medications.   Please remember to try to maintain good sleep hygiene, which means: Keep a regular sleep and wake schedule, try not to exercise or have a meal within 2 hours of your bedtime, try to keep your bedroom conducive for sleep, that is, cool and dark, without light distractors such as an illuminated alarm clock, and refrain from watching TV right before sleep or in the middle of the night and do not keep the TV or radio on during the night. Also, try not to use or play on electronic devices at bedtime, such as your cell phone, tablet PC or laptop. If you like to read at bedtime on an electronic device, try to dim the background light as much as possible. Do not eat in the middle of the night.   Please reduce sweet tea!

## 2014-02-03 ENCOUNTER — Ambulatory Visit: Payer: Medicare Other | Admitting: Physical Therapy

## 2014-02-07 ENCOUNTER — Ambulatory Visit: Payer: Medicare Other | Admitting: Physical Medicine & Rehabilitation

## 2014-02-09 ENCOUNTER — Ambulatory Visit: Payer: Medicare Other | Admitting: Occupational Therapy

## 2014-02-09 ENCOUNTER — Ambulatory Visit: Payer: Medicare Other | Admitting: Physical Therapy

## 2014-02-10 ENCOUNTER — Ambulatory Visit: Payer: Medicare Other | Admitting: Rehabilitative and Restorative Service Providers"

## 2014-02-10 ENCOUNTER — Ambulatory Visit: Payer: Medicare Other | Admitting: Occupational Therapy

## 2014-02-16 ENCOUNTER — Encounter: Payer: Medicare Other | Admitting: Occupational Therapy

## 2014-02-16 ENCOUNTER — Ambulatory Visit: Payer: Medicare Other | Admitting: Physical Therapy

## 2014-02-17 ENCOUNTER — Ambulatory Visit: Payer: Medicare Other | Admitting: Rehabilitative and Restorative Service Providers"

## 2014-02-17 ENCOUNTER — Ambulatory Visit: Payer: Medicare Other | Admitting: *Deleted

## 2014-02-24 ENCOUNTER — Ambulatory Visit: Payer: Medicare Other | Admitting: Rehabilitative and Restorative Service Providers"

## 2014-02-24 ENCOUNTER — Ambulatory Visit: Payer: Medicare Other | Attending: Physical Medicine & Rehabilitation | Admitting: Occupational Therapy

## 2014-02-24 DIAGNOSIS — M6281 Muscle weakness (generalized): Secondary | ICD-10-CM | POA: Insufficient documentation

## 2014-02-24 DIAGNOSIS — M25519 Pain in unspecified shoulder: Secondary | ICD-10-CM | POA: Insufficient documentation

## 2014-02-24 DIAGNOSIS — R279 Unspecified lack of coordination: Secondary | ICD-10-CM | POA: Insufficient documentation

## 2014-02-24 DIAGNOSIS — R269 Unspecified abnormalities of gait and mobility: Secondary | ICD-10-CM | POA: Insufficient documentation

## 2014-02-24 DIAGNOSIS — Z5189 Encounter for other specified aftercare: Secondary | ICD-10-CM | POA: Insufficient documentation

## 2014-02-24 DIAGNOSIS — I69959 Hemiplegia and hemiparesis following unspecified cerebrovascular disease affecting unspecified side: Secondary | ICD-10-CM | POA: Insufficient documentation

## 2014-03-02 ENCOUNTER — Ambulatory Visit: Payer: Medicare Other | Admitting: Occupational Therapy

## 2014-03-09 ENCOUNTER — Ambulatory Visit: Payer: Medicare Other | Admitting: Physical Medicine & Rehabilitation

## 2014-03-10 ENCOUNTER — Ambulatory Visit: Payer: Medicare Other

## 2014-03-13 ENCOUNTER — Encounter: Payer: Self-pay | Admitting: Physical Medicine & Rehabilitation

## 2014-03-13 ENCOUNTER — Encounter: Payer: Medicare Other | Attending: Physical Medicine & Rehabilitation

## 2014-03-13 ENCOUNTER — Ambulatory Visit (HOSPITAL_BASED_OUTPATIENT_CLINIC_OR_DEPARTMENT_OTHER): Payer: Medicare Other | Admitting: Physical Medicine & Rehabilitation

## 2014-03-13 VITALS — BP 158/103 | HR 90 | Resp 14 | Ht 67.5 in | Wt 176.0 lb

## 2014-03-13 DIAGNOSIS — F17200 Nicotine dependence, unspecified, uncomplicated: Secondary | ICD-10-CM | POA: Insufficient documentation

## 2014-03-13 DIAGNOSIS — M79609 Pain in unspecified limb: Secondary | ICD-10-CM | POA: Insufficient documentation

## 2014-03-13 DIAGNOSIS — I10 Essential (primary) hypertension: Secondary | ICD-10-CM | POA: Insufficient documentation

## 2014-03-13 DIAGNOSIS — M7500 Adhesive capsulitis of unspecified shoulder: Secondary | ICD-10-CM

## 2014-03-13 DIAGNOSIS — E119 Type 2 diabetes mellitus without complications: Secondary | ICD-10-CM | POA: Insufficient documentation

## 2014-03-13 DIAGNOSIS — I6990 Unspecified sequelae of unspecified cerebrovascular disease: Secondary | ICD-10-CM | POA: Insufficient documentation

## 2014-03-13 DIAGNOSIS — M1610 Unilateral primary osteoarthritis, unspecified hip: Secondary | ICD-10-CM | POA: Insufficient documentation

## 2014-03-13 MED ORDER — GABAPENTIN 300 MG PO CAPS: 300.0000 mg | ORAL_CAPSULE | Freq: Two times a day (BID) | ORAL | Status: AC

## 2014-03-13 NOTE — Progress Notes (Signed)
Subjective:    Patient ID: Laurie [name], female    DOB: 08-Mar-1946, 68 y.o.   MRN: [id number]  HPI  Pain Inventory Average Pain 10 Pain Right Now 8 My pain is sharp, burning, dull, stabbing, tingling and aching  In the last 24 hours, has pain interfered with the following? General activity 9 Relation with others 5 Enjoyment of life 10 What TIME of day is your pain at its worst? morning and night Sleep (in general) Fair  Pain is worse with: walking, bending, sitting, standing and some activites Pain improves with: rest, therapy/exercise and medication Relief from Meds: 6  Mobility walk without assistance walk with assistance use a cane ability to climb steps?  yes do you drive?  yes  Function disabled: date disabled . retired I need assistance with the following:  household duties and shopping  Neuro/Psych weakness numbness tingling trouble walking spasms confusion depression  Prior Studies Any changes since last visit?  no  Physicians involved in your care Any changes since last visit?  no   Family History  Problem Relation Age of Onset  . CAD Mother   . CAD Father    History   Social History  . Marital Status: Divorced    Spouse Name: N/A    Number of Children: N/A  . Years of Education: N/A   Social History Main Topics  . Smoking status: Current Every Day Smoker  . Smokeless tobacco: None  . Alcohol Use: No  . Drug Use: No  . Sexual Activity: None   Other Topics Concern  . None   Social History Narrative   She lives with her sister and they do not have a good relationship.   History reviewed. No pertinent past surgical history. Past Medical History  Diagnosis Date  . Hypertension   . Diabetes mellitus   . Chest pain     Troponins up to 0.12 while hospitalized for E.coli PNA  . 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 Bacteremia   . Anemia   . Narcolepsy   . Stroke    BP 158/103  Pulse 90  Resp 14  Ht  5' 7.5" (1.715 m)  Wt 176 lb (79.833 kg)  BMI 27.14 kg/m2  SpO2 100%  Opioid Risk Score:   Fall Risk Score: High Fall Risk (>13 points) (patient educted handout given)   Review of Systems  Cardiovascular: Positive for leg swelling.  Gastrointestinal: Positive for constipation.  Neurological: Positive for weakness and numbness.  Psychiatric/Behavioral: Positive for confusion and dysphoric mood.  All other systems reviewed and are negative.       Objective:   Physical Exam  Left shoulder pain with external rotation for flexion abduction Active range of motion 3 minus to 90 with abduction Swelling dorsum of the hand      Assessment & Plan:  #1. 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 Will do injection today start on gabapentin and is not much better in 3 weeks would do Medrol Dosepak  Shoulder injection   Indication:Right Shoulder pain not relieved by medication management and other conservative care.  Informed consent was obtained after describing risks and benefits of the procedure with the patient, this includes bleeding, bruising, infection and medication side effects. The patient wishes to proceed and has given written consent. Patient was placed in a  seated position. The Right shoulder was marked and prepped with betadine in the subacromial area. A 25-gauge 1-1/2 inch needle was inserted into the subacromial area. After negative draw back for blood, a solution containing 1 mL of 6 mg per ML betamethasone and 4 mL of 1% lidocaine was injected. A band aid was applied. The patient tolerated the procedure well. Post procedure instructions were given.

## 2014-03-13 NOTE — Patient Instructions (Signed)
Gabapentin dose increase to 300 mg twice a day Injection today may take up to 48 hours to start working Continue outpatient therapy Reevaluation in 3-4 weeks

## 2014-03-16 ENCOUNTER — Ambulatory Visit: Payer: Medicare Other | Admitting: Rehabilitative and Restorative Service Providers"

## 2014-03-16 ENCOUNTER — Ambulatory Visit: Payer: Medicare Other | Admitting: Occupational Therapy

## 2014-03-16 ENCOUNTER — Ambulatory Visit (INDEPENDENT_AMBULATORY_CARE_PROVIDER_SITE_OTHER): Payer: Medicare Other | Admitting: Neurology

## 2014-03-16 DIAGNOSIS — G4761 Periodic limb movement disorder: Secondary | ICD-10-CM

## 2014-03-16 DIAGNOSIS — G4733 Obstructive sleep apnea (adult) (pediatric): Secondary | ICD-10-CM

## 2014-03-16 DIAGNOSIS — I639 Cerebral infarction, unspecified: Secondary | ICD-10-CM

## 2014-03-16 DIAGNOSIS — G47411 Narcolepsy with cataplexy: Secondary | ICD-10-CM

## 2014-03-17 ENCOUNTER — Ambulatory Visit: Payer: Medicare Other | Admitting: Occupational Therapy

## 2014-03-17 DIAGNOSIS — G4733 Obstructive sleep apnea (adult) (pediatric): Secondary | ICD-10-CM

## 2014-03-17 DIAGNOSIS — G47411 Narcolepsy with cataplexy: Secondary | ICD-10-CM

## 2014-03-17 DIAGNOSIS — G4761 Periodic limb movement disorder: Secondary | ICD-10-CM

## 2014-03-20 ENCOUNTER — Ambulatory Visit: Payer: Medicare Other | Admitting: Physical Therapy

## 2014-03-20 NOTE — Sleep Study (Signed)
See media tab for full report  

## 2014-03-20 NOTE — Sleep Study (Signed)
MSLT Nap Questionnaire  PRE NAP SLEEPINESS SCALE 1-7  1 - Feeling Active and vital, alert, wide awake  2 - Functioning at a high level, but not at peak, able to concentrate  3 - Relaxed and awake, not at full alertness, responsive  4- A little foggy, let down  5 - Fogginess, beginning to lose interest in remaining awake  6 - Sleepiness, prefer to be lying down, fighting sleep, woozy  7 - Almost in reverie, sleep onset soon, losing struggle to remain awake  =============================================================== Nap #1 Pre nap level of sleepiness:  2/7 Lights out time:  8:12 AM  Comments:  Patient ate bacon/egg/cheese biscuit and decaf coffee prior to napping.  Post Nap Questions for nap #1:  Did you sleep?  no  Did you dream? Not really  ================================================================ Nap #2 Pre nap level of sleepiness:  2/7 Lights out time: 10:10 AM  Comments:   Post Nap Questions for nap #2:  Had to enter room to wake patient, she would not respond to multiple attempts to wake her  Did you sleep?  Did you dream? ================================================================ Nap #3 Pre nap level of sleepiness:  7/7 Lights out time:  12:10 PM  Comments:  Patient dozing at edge of bed around 11:40 AM  Post Nap Questions for Nap #3:  Did you sleep?  YES  Did you dream?  YES  =============================================================== Nap #4 Pre nap level of sleepiness:  7/7 Lights out time:  2:06 PM  Comments:  Started nap early because patient was sleeping and I couldn't get her to wake, tried to stimulate her and she was dozing sitting up, head would drop.  Post Nap Questions for Nap #4:  Did you sleep?  yes  Did you dream?  yes  =============================================================== Nap #5 Pre nap level of sleepiness:  7/7 Lights out time:  4:17 pm, delayed because patient had to use the restroom  Comments:  PATIENT  SLEEPING BETWEEN NAPS, HAD TO STIMULATE HER AND ENCOURAGE HER TO SNACK, MOVE AROUND, ETC.  Post Nap Questions for Nap #5:  Did you sleep?YES  Did you dream? YES  ===============================================================

## 2014-03-22 ENCOUNTER — Ambulatory Visit: Payer: Medicare Other | Attending: Physical Medicine & Rehabilitation | Admitting: Physical Therapy

## 2014-03-22 DIAGNOSIS — M25519 Pain in unspecified shoulder: Secondary | ICD-10-CM | POA: Diagnosis not present

## 2014-03-22 DIAGNOSIS — I69959 Hemiplegia and hemiparesis following unspecified cerebrovascular disease affecting unspecified side: Secondary | ICD-10-CM | POA: Insufficient documentation

## 2014-03-22 DIAGNOSIS — R279 Unspecified lack of coordination: Secondary | ICD-10-CM | POA: Diagnosis not present

## 2014-03-22 DIAGNOSIS — Z5189 Encounter for other specified aftercare: Secondary | ICD-10-CM | POA: Diagnosis not present

## 2014-03-22 DIAGNOSIS — M6281 Muscle weakness (generalized): Secondary | ICD-10-CM | POA: Diagnosis not present

## 2014-03-22 DIAGNOSIS — R269 Unspecified abnormalities of gait and mobility: Secondary | ICD-10-CM | POA: Insufficient documentation

## 2014-03-23 ENCOUNTER — Ambulatory Visit: Payer: Medicare Other | Admitting: Occupational Therapy

## 2014-03-23 DIAGNOSIS — Z5189 Encounter for other specified aftercare: Secondary | ICD-10-CM | POA: Diagnosis not present

## 2014-03-24 ENCOUNTER — Telehealth: Payer: Self-pay | Admitting: Neurology

## 2014-03-24 DIAGNOSIS — R9431 Abnormal electrocardiogram [ECG] [EKG]: Secondary | ICD-10-CM

## 2014-03-24 DIAGNOSIS — I639 Cerebral infarction, unspecified: Secondary | ICD-10-CM

## 2014-03-24 DIAGNOSIS — G47411 Narcolepsy with cataplexy: Secondary | ICD-10-CM

## 2014-03-24 NOTE — Telephone Encounter (Signed)
Please also reminded patient not to drive when she feels sleepy.

## 2014-03-24 NOTE — Telephone Encounter (Signed)
Please call and inform patient that the recent sleep studies did confirm the diagnosis of narcolepsy and that I would recommend treatment for this. I would like to go over details of the studies during an appointment. If not already arranged, please arrange for an appointment next available with me. We will talk about the test results and discuss additional treatment options at the time. While she reported having done well with Adderall in the past, it may not be feasible to use a stimulant at this time as I noted that she had several EKG changes during the sleep study. If we decide to go ahead with a stimulant medication she will have to see a cardiologist first for clearance. I have placed a referral to cardiology and would like for her to see a heart doctor.  Thanks, Huston FoleySaima Noelly Lasseigne, MD, PhD [facility])

## 2014-03-27 NOTE — Telephone Encounter (Signed)
Called patient to discuss sleep study results.  Discussed findings, recommendations and follow up care.  Patient understood well and all questions were answered.   Follow up appt scheduled for 04/07/14 at 9:00 AM with Dr. Frances FurbishAthar to discuss findings and recommendations.  Pt cautioned about driving when she is sleepy.  She has already been contacted by Cardiology and that appt has been arranged in May. A copy of the sleep study interpretive report as well as a letter with the date of the follow up appointment info will be mailed to the patient's home.

## 2014-03-28 ENCOUNTER — Encounter: Payer: Self-pay | Admitting: *Deleted

## 2014-03-29 ENCOUNTER — Ambulatory Visit: Payer: Medicare Other | Admitting: Rehabilitative and Restorative Service Providers"

## 2014-03-29 ENCOUNTER — Ambulatory Visit: Payer: Medicare Other

## 2014-03-29 DIAGNOSIS — Z5189 Encounter for other specified aftercare: Secondary | ICD-10-CM | POA: Diagnosis not present

## 2014-03-31 ENCOUNTER — Ambulatory Visit: Payer: Medicare Other

## 2014-03-31 ENCOUNTER — Ambulatory Visit: Payer: Medicare Other | Admitting: Physical Therapy

## 2014-03-31 DIAGNOSIS — Z5189 Encounter for other specified aftercare: Secondary | ICD-10-CM | POA: Diagnosis not present

## 2014-04-03 ENCOUNTER — Ambulatory Visit: Payer: Medicare Other | Admitting: Physical Medicine & Rehabilitation

## 2014-04-03 ENCOUNTER — Encounter: Payer: Medicare Other | Attending: Physical Medicine & Rehabilitation

## 2014-04-03 DIAGNOSIS — M79609 Pain in unspecified limb: Secondary | ICD-10-CM | POA: Insufficient documentation

## 2014-04-03 DIAGNOSIS — I10 Essential (primary) hypertension: Secondary | ICD-10-CM | POA: Insufficient documentation

## 2014-04-03 DIAGNOSIS — I6990 Unspecified sequelae of unspecified cerebrovascular disease: Secondary | ICD-10-CM | POA: Insufficient documentation

## 2014-04-03 DIAGNOSIS — F17200 Nicotine dependence, unspecified, uncomplicated: Secondary | ICD-10-CM | POA: Insufficient documentation

## 2014-04-03 DIAGNOSIS — E119 Type 2 diabetes mellitus without complications: Secondary | ICD-10-CM | POA: Insufficient documentation

## 2014-04-03 DIAGNOSIS — M1610 Unilateral primary osteoarthritis, unspecified hip: Secondary | ICD-10-CM | POA: Insufficient documentation

## 2014-04-04 ENCOUNTER — Ambulatory Visit: Payer: Medicare Other | Admitting: Physical Medicine & Rehabilitation

## 2014-04-05 ENCOUNTER — Ambulatory Visit: Payer: Medicare Other | Admitting: Rehabilitative and Restorative Service Providers"

## 2014-04-05 ENCOUNTER — Ambulatory Visit: Payer: Medicare Other | Admitting: Occupational Therapy

## 2014-04-07 ENCOUNTER — Ambulatory Visit: Payer: Medicare Other | Admitting: Physical Therapy

## 2014-04-07 ENCOUNTER — Institutional Professional Consult (permissible substitution): Payer: Medicare Other | Admitting: Neurology

## 2014-04-07 ENCOUNTER — Encounter: Payer: Medicare Other | Admitting: Occupational Therapy

## 2014-04-10 ENCOUNTER — Ambulatory Visit: Payer: Medicare Other | Admitting: Physical Therapy

## 2014-04-10 ENCOUNTER — Ambulatory Visit: Payer: Medicare Other | Admitting: Occupational Therapy

## 2014-04-10 DIAGNOSIS — Z5189 Encounter for other specified aftercare: Secondary | ICD-10-CM | POA: Diagnosis not present

## 2014-04-12 ENCOUNTER — Ambulatory Visit: Payer: Medicare Other | Admitting: Rehabilitative and Restorative Service Providers"

## 2014-04-12 ENCOUNTER — Ambulatory Visit: Payer: Medicare Other | Admitting: *Deleted

## 2014-04-12 DIAGNOSIS — Z5189 Encounter for other specified aftercare: Secondary | ICD-10-CM | POA: Diagnosis not present

## 2014-04-17 ENCOUNTER — Encounter: Payer: Medicare Other | Admitting: Occupational Therapy

## 2014-04-17 ENCOUNTER — Ambulatory Visit: Payer: Medicare Other | Admitting: Rehabilitative and Restorative Service Providers"

## 2014-04-19 ENCOUNTER — Ambulatory Visit: Payer: Medicare Other | Admitting: Rehabilitative and Restorative Service Providers"

## 2014-04-19 ENCOUNTER — Encounter: Payer: Medicare Other | Admitting: Occupational Therapy

## 2014-04-25 ENCOUNTER — Ambulatory Visit: Payer: Medicare Other | Admitting: Physical Medicine & Rehabilitation

## 2014-05-29 ENCOUNTER — Encounter: Payer: Self-pay | Admitting: Physical Medicine & Rehabilitation

## 2014-05-29 ENCOUNTER — Encounter: Payer: Medicare Other | Attending: Physical Medicine & Rehabilitation

## 2014-05-29 ENCOUNTER — Ambulatory Visit (HOSPITAL_BASED_OUTPATIENT_CLINIC_OR_DEPARTMENT_OTHER): Payer: Medicare Other | Admitting: Physical Medicine & Rehabilitation

## 2014-05-29 ENCOUNTER — Ambulatory Visit (INDEPENDENT_AMBULATORY_CARE_PROVIDER_SITE_OTHER): Payer: Medicare Other | Admitting: Podiatry

## 2014-05-29 ENCOUNTER — Encounter: Payer: Self-pay | Admitting: Podiatry

## 2014-05-29 VITALS — BP 132/80 | HR 97 | Resp 16

## 2014-05-29 VITALS — BP 132/80 | HR 97 | Resp 16 | Ht 67.5 in | Wt 171.0 lb

## 2014-05-29 DIAGNOSIS — I6350 Cerebral infarction due to unspecified occlusion or stenosis of unspecified cerebral artery: Secondary | ICD-10-CM

## 2014-05-29 DIAGNOSIS — M76899 Other specified enthesopathies of unspecified lower limb, excluding foot: Secondary | ICD-10-CM

## 2014-05-29 DIAGNOSIS — IMO0002 Reserved for concepts with insufficient information to code with codable children: Secondary | ICD-10-CM

## 2014-05-29 DIAGNOSIS — I6990 Unspecified sequelae of unspecified cerebrovascular disease: Secondary | ICD-10-CM | POA: Insufficient documentation

## 2014-05-29 DIAGNOSIS — B351 Tinea unguium: Secondary | ICD-10-CM

## 2014-05-29 DIAGNOSIS — M79609 Pain in unspecified limb: Secondary | ICD-10-CM

## 2014-05-29 DIAGNOSIS — G8110 Spastic hemiplegia affecting unspecified side: Secondary | ICD-10-CM

## 2014-05-29 DIAGNOSIS — E119 Type 2 diabetes mellitus without complications: Secondary | ICD-10-CM | POA: Insufficient documentation

## 2014-05-29 DIAGNOSIS — M1610 Unilateral primary osteoarthritis, unspecified hip: Secondary | ICD-10-CM | POA: Insufficient documentation

## 2014-05-29 DIAGNOSIS — M7062 Trochanteric bursitis, left hip: Secondary | ICD-10-CM

## 2014-05-29 DIAGNOSIS — I10 Essential (primary) hypertension: Secondary | ICD-10-CM | POA: Insufficient documentation

## 2014-05-29 DIAGNOSIS — F17200 Nicotine dependence, unspecified, uncomplicated: Secondary | ICD-10-CM | POA: Insufficient documentation

## 2014-05-29 DIAGNOSIS — M7550 Bursitis of unspecified shoulder: Secondary | ICD-10-CM

## 2014-05-29 DIAGNOSIS — M75100 Unspecified rotator cuff tear or rupture of unspecified shoulder, not specified as traumatic: Secondary | ICD-10-CM

## 2014-05-29 MED ORDER — TRAMADOL HCL 50 MG PO TABS: 50.0000 mg | ORAL_TABLET | Freq: Three times a day (TID) | ORAL | Status: DC

## 2014-05-29 NOTE — Progress Notes (Signed)
Subjective:    Patient ID: Laurie [name], female    DOB: December 02, 1946, 68 y.o.   MRN: [id number]  HPI Chief complaint is left hip pain, denies any falls denies any trauma to that area Prior history of left hip pain which responded well to trochanteric bursa injection in the past. Last hip injection performed on 01/10/2014. Started wear off over the last month or so  Secondary concerns is left shoulder pain. She has had good relief after left shoulder subacromial injection less performed in March of 2015 Pain Inventory Average Pain 7 Pain Right Now 10 My pain is sharp, burning, dull and stabbing  In the last 24 hours, has pain interfered with the following? General activity 8 Relation with others 10 Enjoyment of life 10 What TIME of day is your pain at its worst? night Sleep (in general) Fair  Pain is worse with: walking, sitting and standing Pain improves with: medication Relief from Meds: 5  Mobility walk with assistance use a cane how many minutes can you walk? 10 ability to climb steps?  yes Do you have any goals in this area?  yes  Function disabled: date disabled 10/2006 I need assistance with the following:  household duties  Neuro/Psych numbness tingling spasms depression anxiety  Prior Studies Any changes since last visit?  no  Physicians involved in your care cardiologist, podiatrist   Family History  Problem Relation Age of Onset  . CAD Mother   . CAD Father    History   Social History  . Marital Status: Divorced    Spouse Name: N/A    Number of Children: N/A  . Years of Education: N/A   Social History Main Topics  . Smoking status: Current Every Day Smoker  . Smokeless tobacco: None  . Alcohol Use: No  . Drug Use: No  . Sexual Activity: None   Other Topics Concern  . None   Social History Narrative   She lives with her sister and they do not have a good relationship.   History reviewed. No pertinent past surgical history. Past  Medical History  Diagnosis Date  . Hypertension   . Diabetes mellitus   . Chest pain     Troponins up to 0.12 while hospitalized for E.coli PNA  . 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 Bacteremia   . Anemia   . Narcolepsy   . Stroke    BP 132/80  Pulse 97  Resp 16  Ht 5' 7.5" (1.715 m)  Wt 171 lb (77.565 kg)  BMI 26.37 kg/m2  SpO2 99%  Opioid Risk Score:   Fall Risk Score: Moderate Fall Risk (6-13 points) (patient educated handout declined)   Review of Systems  Constitutional: Positive for unexpected weight change.  Gastrointestinal: Positive for abdominal pain and constipation.  Musculoskeletal: Positive for arthralgias and myalgias.  Neurological: Positive for numbness.       Tingling, spasm  Psychiatric/Behavioral: Positive for dysphoric mood. The patient is nervous/anxious.   All other systems reviewed and are negative.      Objective:   Physical Exam  Tenderness palpation left greater trochanter No tenderness to palpation of right greater trochanter Moderate reduction and internal and external rotation left hip mild production of internal and external rotation right hip Gait without evidence to regular knee stability Left upper extremity 3 minus strength at the deltoid, bicep, tricep, grip, finger flexors and extensors Increased tone at the left finger flexors  MAS grade 1 no evidence of clonus  Positive Hawkins maneuver left shoulder     Assessment & Plan:  #1. 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 Left hip trochanteric bursitis may reinject as it has been about 5 months since injection Heparin and out exercises for the patient she has had these in the past. Have encouraged her to keep up with stretching and strengthening to the hip region  Left Trochanteric bursa  injection   Indication Trochanteric bursitis. Exam has tenderness over the greater trochanter of the hip. Pain has not responded to conservative care such as exercise therapy and oral medications. Pain interferes with sleep or with mobility Informed consent was obtained after describing risks and benefits of the procedure with the patient these include bleeding bruising and infection. Patient has signed written consent form. Patient placed in a lateral decubitus position with the affected hip superior. Point of maximal pain was palpated marked and prepped with Betadine and entered with a needle to bone contact. Needle slightly withdrawn then 6mg  of betamethasone with 4 cc 1% lidocaine were injected. Patient tolerated procedure well. Post procedure instructions given.

## 2014-05-29 NOTE — Patient Instructions (Signed)

## 2014-05-29 NOTE — Progress Notes (Signed)
   Subjective:    Patient ID: [name], female    DOB: 1946/08/27, 68 y.o.   MRN: [id number]  HPI Comments: "I need my feet checked"  Patient states that he needs her feet checked because she is diabetic. She says that her feet get numb. The 2 big toes, the skin around the toenails, are dark.   Diabetes Hypoglycemia symptoms include nervousness/anxiousness.      Review of Systems  Constitutional: Positive for activity change, appetite change and unexpected weight change.  Eyes: Positive for visual disturbance.  Cardiovascular: Positive for leg swelling.       Calf pain with walking  Gastrointestinal: Positive for constipation.  Neurological: Positive for numbness.  Hematological: Bruises/bleeds easily.  Psychiatric/Behavioral: The patient is nervous/anxious.   All other systems reviewed and are negative.      Objective:   Physical Exam        Assessment & Plan:

## 2014-05-29 NOTE — Patient Instructions (Signed)
Diabetes and Foot Care Diabetes may cause you to have problems because of poor blood supply (circulation) to your feet and legs. This may cause the skin on your feet to become thinner, break easier, and heal more slowly. Your skin may become dry, and the skin may peel and crack. You may also have nerve damage in your legs and feet causing decreased feeling in them. You may not notice minor injuries to your feet that could lead to infections or more serious problems. Taking care of your feet is one of the most important things you can do for yourself.  HOME CARE INSTRUCTIONS  Wear shoes at all times, even in the house. Do not go barefoot. Bare feet are easily injured.  Check your feet daily for blisters, cuts, and redness. If you cannot see the bottom of your feet, use a mirror or ask someone for help.  Wash your feet with warm water (do not use hot water) and mild soap. Then pat your feet and the areas between your toes until they are completely dry. Do not soak your feet as this can dry your skin.  Apply a moisturizing lotion or petroleum jelly (that does not contain alcohol and is unscented) to the skin on your feet and to dry, brittle toenails. Do not apply lotion between your toes.  Trim your toenails straight across. Do not dig under them or around the cuticle. File the edges of your nails with an emery board or nail file.  Do not cut corns or calluses or try to remove them with medicine.  Wear clean socks or stockings every day. Make sure they are not too tight. Do not wear knee-high stockings since they may decrease blood flow to your legs.  Wear shoes that fit properly and have enough cushioning. To break in new shoes, wear them for just a few hours a day. This prevents you from injuring your feet. Always look in your shoes before you put them on to be sure there are no objects inside.  Do not cross your legs. This may decrease the blood flow to your feet.  If you find a minor scrape,  cut, or break in the skin on your feet, keep it and the skin around it clean and dry. These areas may be cleansed with mild soap and water. Do not cleanse the area with peroxide, alcohol, or iodine.  When you remove an adhesive bandage, be sure not to damage the skin around it.  If you have a wound, look at it several times a day to make sure it is healing.  Do not use heating pads or hot water bottles. They may burn your skin. If you have lost feeling in your feet or legs, you may not know it is happening until it is too late.  Make sure your health care provider performs a complete foot exam at least annually or more often if you have foot problems. Report any cuts, sores, or bruises to your health care provider immediately. SEEK MEDICAL CARE IF:   You have an injury that is not healing.  You have cuts or breaks in the skin.  You have an ingrown nail.  You notice redness on your legs or feet.  You feel burning or tingling in your legs or feet.  You have pain or cramps in your legs and feet.  Your legs or feet are numb.  Your feet always feel cold. SEEK IMMEDIATE MEDICAL CARE IF:   There is increasing redness,   swelling, or pain in or around a wound.  There is a red line that goes up your leg.  Pus is coming from a wound.  You develop a fever or as directed by your health care provider.  You notice a bad smell coming from an ulcer or wound. Document Released: 12/05/2000 Document Revised: 08/10/2013 Document Reviewed: 05/17/2013 [employer] Patient Information 2014 [employer]  

## 2014-05-30 NOTE — Progress Notes (Signed)
Subjective:     Patient ID: Laurie [name], female   DOB: September 17, 1946, 68 y.o.   MRN: [id number]  Diabetes   patient states that she's here for diabetic exam as she has diminished neuro sensation and nails that become painful that she cannot cut and she is worried that she might develop other problems   Review of Systems  All other systems reviewed and are negative.      Objective:   Physical Exam  Nursing note and vitals reviewed. Constitutional: She is oriented to person, place, and time.  Cardiovascular: Intact distal pulses.   Musculoskeletal: Normal range of motion.  Neurological: She is oriented to person, place, and time.  Skin: Skin is warm.   neurovascular status found to be intact with thick nailbeds 1-5 both feet that are painful and I noted there is well neurological was intact a diminishment of Charcot vibratory but not significant with diminished range of motion subtalar midtarsal joint normal muscle strength and no equinus condition noted    Assessment:     Diabetic who is moderately at risk in this at this point is doing well with nail disease that is painful but no indications of advanced neuropathy    Plan:     Reviewed condition and debridement nailbeds and instructed that the most important thing she can do is keep her sugar under good control. Patient will be seen back for is to recheck 3 months earlier if any cuts or abrasions of her foot should occur

## 2014-07-11 ENCOUNTER — Ambulatory Visit: Payer: Medicare Other | Admitting: Physical Medicine & Rehabilitation

## 2014-07-11 ENCOUNTER — Encounter: Payer: Medicare Other | Attending: Physical Medicine & Rehabilitation

## 2014-07-11 DIAGNOSIS — I10 Essential (primary) hypertension: Secondary | ICD-10-CM | POA: Insufficient documentation

## 2014-07-11 DIAGNOSIS — M1610 Unilateral primary osteoarthritis, unspecified hip: Secondary | ICD-10-CM | POA: Insufficient documentation

## 2014-07-11 DIAGNOSIS — I6990 Unspecified sequelae of unspecified cerebrovascular disease: Secondary | ICD-10-CM | POA: Insufficient documentation

## 2014-07-11 DIAGNOSIS — F17200 Nicotine dependence, unspecified, uncomplicated: Secondary | ICD-10-CM | POA: Insufficient documentation

## 2014-07-11 DIAGNOSIS — M79609 Pain in unspecified limb: Secondary | ICD-10-CM | POA: Insufficient documentation

## 2014-07-11 DIAGNOSIS — E119 Type 2 diabetes mellitus without complications: Secondary | ICD-10-CM | POA: Insufficient documentation

## 2014-08-12 ENCOUNTER — Other Ambulatory Visit: Payer: Self-pay | Admitting: Physical Medicine & Rehabilitation

## 2014-08-28 MED ORDER — SODIUM CHLORIDE 0.9 % IV SOLN: INTRAVENOUS | Status: DC

## 2014-08-29 ENCOUNTER — Ambulatory Visit (HOSPITAL_COMMUNITY): Admission: RE | Admit: 2014-08-29 | Payer: Medicare Other | Source: Ambulatory Visit | Admitting: Cardiology

## 2014-08-29 ENCOUNTER — Encounter (HOSPITAL_COMMUNITY): Admission: RE | Payer: Self-pay | Source: Ambulatory Visit

## 2014-08-29 SURGERY — ANGIOGRAM, LOWER EXTREMITY
Anesthesia: LOCAL

## 2014-08-31 ENCOUNTER — Encounter: Payer: Medicare Other | Admitting: Podiatry

## 2014-09-01 NOTE — Progress Notes (Signed)
Subjective:     Patient ID: Laurie [name], female   DOB: 1946/01/14, 68 y.o.   MRN: [id number]  HPI patient presents with thick yellow brittle nailbeds 1-5 both feet that are painful   Review of Systems     Objective:   Physical Exam Neurovascular status intact with thick brittle nailbeds 1-5 both feet that she cannot cut and are painful    Assessment:     Mycotic nail infection is with pain 1-5 both feet    Plan:     Debride painful nailbeds 1-5 both feet with no iatrogenic bleeding noted

## 2014-10-02 NOTE — H&P (Signed)
Laurie [name] is an 68 y.o. female.   Chief Complaint: Leg pain with activity HPI: Ms. Laurie Hatchnn Thurston Hole([name]) Laurie [name] is a 68 year old AA female who presents to me for follow-up and evaluation of abnormal EKG and also peripheral arterial disease. Patient has history of hypertension, hyperlipidemia, anxiety and depression, history of diabetes mellitus and has had stroke in 2014 when she presented with left-sided weakness and slurred speech. She still has weakness on the left side of the body. However over the past few weeks she has noticed that she is able to lift her left arm much higher than what she used to before. Her main complaint is severe pain and cramping in her left leg all the way from her thighs to the feet. Pain has been unbearable. She has not noticed any ulcerations.  Patient also complains of severe cramping in her legs, both the lower extremities cramping, left leg cramps constantly, however she states that on walking she has severe cramping in both the lower extremities especially in the calves including right leg and this has limited her activity. No chest pain, shortness of breath, PND or orthopnea. No palpitations, dizziness or syncope.  Past Medical History  Diagnosis Date  . Hypertension   . Diabetes mellitus   . Chest pain     Troponins up to 0.12 while hospitalized for E.coli PNA  . 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 Bacteremia   . Anemia   . Narcolepsy   . Stroke     No past surgical history on file.  Family History  Problem Relation Age of Onset  . CAD Mother   . CAD Father    Social History:  reports that she has been smoking.  She does not have any smokeless tobacco history on file. She reports that she does not drink alcohol or use illicit drugs.  Allergies:  Allergies  Allergen Reactions  . Penicillins     No prescriptions prior to admission      Review of Systems - General:Not Present- Fatigue, Fever and Night  Sweats. Skin:Not Present- Itching and Rash. HEENT:Not Present- Headache. Respiratory:Not Present- Difficulty Breathing. Cardiovascular:Present- Claudications, Leg Cramps and Leg Pain and/or Swelling. Not Present- Fainting, Orthopnea and Swelling of Extremities. Gastrointestinal:Not Present- Abdominal Pain, Constipation, Diarrhea, Nausea and Vomiting. Musculoskeletal:Not Present- Joint Swelling. Neurological:Present- Unsteadiness and Weakness (left hemiplegia). Not Present- Headaches. Hematology:Not Present- Blood Clots, Easy Bruising and Nose Bleed.   Physical exam:  Blood pressure 158/69, pulse 79, temperature 98 F (36.7 C), temperature source Oral, resp. rate 18, height 5' 7.5" (1.715 m), weight 74.39 kg (164 lb), SpO2 100.00%.  General: Well built and normal body habitus who is in no acute distress. Appears stated age. Alert Ox3.   There is no cyanosis. HEENT:No JVD. No facial droop.  CARDIAC EXAM: S1, S2 normal, no gallop present. No murmur.   CHEST EXAM: No tenderness of chest wall. LUNGS: Clear to percuss and auscultate.  ABDOMEN: No hepatosplenomegaly. BS normal in all 4 quadrants. Abdomen is non-tender.   EXTREMITY: 2 plus pitting edema of both the upper and lower extremities. MUSCULOSKELETAL EXAM: Left upper and lower extremity decreased sternght noted.  NEUROLOGIC EXAM: Left hemiparesis. Alert O x 3.   VASCULAR EXAM: No skin breakdown. Left leg colder than the right. Carotids prominent bilateral bruit. Extremities: Femoral pulse normal with loud bruit bilateral. Popliteal pulse absent bilateral , Pedal pulse absent bilateral . Left greater toe ischemic discoloration without ulceration  resolved since last OV. No prominent pulse felt in the abdomen. No varicose veins  Labs:   Lab Results  Component Value Date   WBC 7.6 10/27/2013   HGB 12.5 10/27/2013   HCT 35.8* 10/27/2013   MCV 92.0 10/27/2013   PLT 256 10/27/2013   No results found for this  basename: NA, K, CL, CO2, BUN, CREATININE, CALCIUM, LABALBU, PROT, BILITOT, ALKPHOS, ALT, AST, GLUCOSE,  in the last 168 hours Lab Results  Component Value Date   CKTOTAL 69 04/29/2009   CKMB 1.6 04/29/2009   TROPONINI <0.30 10/24/2013    Lipid Panel     Component Value Date/Time   CHOL 150 10/25/2013 0635   TRIG 158* 10/25/2013 0635   HDL 35* 10/25/2013 0635   CHOLHDL 4.3 10/25/2013 0635   VLDL 32 10/25/2013 0635   LDLCALC 83 10/25/2013 0635   Office EKG 05/04/2014: Normal sinus rhythm at the rate of 87 bpm, normal axis, PVCs. Nonspecific ST-T abnormality.   Assessment/Plan 1. Diabetes mellitus with peripheral artery disease (250.70)  2. PAD (peripheral artery disease) (443.9) ABI 06/28/2014: This exam reveals mildly decreased perfusion of the right lower extremity, noted at the post tibial artery level. This exam reveals severely decreased perfusion of the left lower extremity, noted at the post tibial artery level. RABI 0.80 and LABI 0.46. Consider complete LE arterial duplex to further evaluate the PAD.  LE arterial duplex 05/24/2014: No hemodynamically significant stenoses are identified in the either side lower extremity arterial system.This exam reveals moderately decreased perfusion of the right lower extremity, noted at the post tibial artery level with ABI 0.62. Left ABI could not be calculated due to trickle flow. Presence of diffuse high grade stenosis in bilateral proximal to mid SFA cannot be excluded as suggested by monophasic waveforms. Consider further w/u if clinically indicated.  3. Abnormal EKG (794.31) Lexiscan Myoview stress test 05/08/2014: 1. Resting EKG NSR, LAD, LAFB, PVC. Poor R wave progression. Cannot exclude anterior infarct, old. Stress EKG was non diagnostic for ischemia. No ST-T changes of ischemia noted with pharmacologic stress testing. Stress symptoms included nausea and headache. 2. The RAW Images reveal normal left frontal end-diastolic volume both at rest and  with stress. The perfusion study demonstrated mild decrease in counts in the inferior wall at rest which improved with stress suggestive of soft tissue attenuation artifact. There was no evidence of ischemia or scar. However, dynamic gated images reveal inferior and inferoseptal hypokinesis. Left ventricular ejection fraction was estimated to be 46%. This overall represents a low risk study, however clinical correlation is recommended in view of wall motion of Wall motion abnormality.  Hospital Echocardiogram 10/25/2013: Normal left ventricle systolic function, mild LVH, grade 2 diastolic his function. Mild mitral regurgitation. Impression: EKG 05/04/2014: Normal sinus rhythm at the rate of 87 bpm, normal axis, PVCs. Nonspecific ST-T abnormality.  4. Benign essential hypertension (401.1)  5. Diabetes type 2, uncontrolled (250.02) HbA1c 10/25/2013: 7.5%. CBC was normal, CMP was normal except for elevated blood sugar.  6. Hyperlipidemia (272.4)  Rec: Patient presents to me for follow-up and evaluation of peripheral arterial disease. Her symptoms of claudication or worsened in the left leg, all the way from the thigh to her legs to her feet have been hurting even with minimal activities. On physical exam, her left leg is slightly colder than the right although there is no clinical evidence of critical limb ischemia. I suspect she probably has severe diffuse disease all the way from the femoral artery below, she  will need peripheral arteriogram for limb preservation. I have started her on clopidogrel 75 mg by mouth daily. I discontinued lisinopril and hydrochlorothiazide and switched to lisinopril HCT 20/25 one by mouth every morning and added amlodipine 5 mg by mouth daily for better blood pressure control. Patient does admit to having eaten chips recently. Office visit after the peripheral arteriogram and possible angioplasty. I will obtain lipid profile along with preprocedure labs including  HbA1c.  Needs to schedule for peripheral arteriogram and possible angioplasty given symptoms. Patient understands the risks, benefits, alternatives including medical therapy, CT angiography. Patient understands <1-2% risk of death, embolic complications, bleeding, infection, renal failure, urgent surgical revascularization, but not limited to these and wants to proceed.  Pamella Pert, MD 10/02/2014, 8:54 PM [facility] Pager: [phone] Office: [phone] If no answer: Cell:  [phone]

## 2014-10-03 ENCOUNTER — Encounter (HOSPITAL_COMMUNITY)
Admission: RE | Disposition: A | Discharge status: Home / Self Care | Payer: Self-pay | Source: Ambulatory Visit | Attending: Cardiology

## 2014-10-03 ENCOUNTER — Ambulatory Visit (HOSPITAL_COMMUNITY)
Admission: RE | Admit: 2014-10-03 | Discharge: 2014-10-03 | Disposition: A | Discharge status: Home / Self Care | Payer: Medicare Other | Source: Ambulatory Visit | Attending: Cardiology | Admitting: Cardiology

## 2014-10-03 DIAGNOSIS — E1151 Type 2 diabetes mellitus with diabetic peripheral angiopathy without gangrene: Secondary | ICD-10-CM | POA: Insufficient documentation

## 2014-10-03 DIAGNOSIS — I70213 Atherosclerosis of native arteries of extremities with intermittent claudication, bilateral legs: Secondary | ICD-10-CM | POA: Diagnosis not present

## 2014-10-03 DIAGNOSIS — I10 Essential (primary) hypertension: Secondary | ICD-10-CM | POA: Diagnosis not present

## 2014-10-03 DIAGNOSIS — Z720 Tobacco use: Secondary | ICD-10-CM | POA: Insufficient documentation

## 2014-10-03 DIAGNOSIS — R9431 Abnormal electrocardiogram [ECG] [EKG]: Secondary | ICD-10-CM | POA: Diagnosis present

## 2014-10-03 DIAGNOSIS — E785 Hyperlipidemia, unspecified: Secondary | ICD-10-CM | POA: Insufficient documentation

## 2014-10-03 DIAGNOSIS — I69354 Hemiplegia and hemiparesis following cerebral infarction affecting left non-dominant side: Secondary | ICD-10-CM | POA: Diagnosis not present

## 2014-10-03 HISTORY — PX: LOWER EXTREMITY ANGIOGRAM: SHX5508

## 2014-10-03 LAB — BASIC METABOLIC PANEL
Anion gap: 11 (ref 5–15)
BUN: 17 mg/dL (ref 6–23)
CALCIUM: 9.2 mg/dL (ref 8.4–10.5)
CO2: 24 mEq/L (ref 19–32)
Chloride: 110 mEq/L (ref 96–112)
Creatinine, Ser: 0.79 mg/dL (ref 0.50–1.10)
GFR calc Af Amer: >90 mL/min (ref >90)
GFR calc non Af Amer: 86 mL/min — ABNORMAL LOW (ref >90)
GLUCOSE: 128 mg/dL — AB (ref 70–99)
Potassium: 3.7 mEq/L (ref 3.7–5.3)
Sodium: 145 mEq/L (ref 137–147)

## 2014-10-03 LAB — CBC
HEMATOCRIT: 36.2 % (ref 36.0–46.0)
Hemoglobin: 11.9 g/dL — ABNORMAL LOW (ref 12.0–15.0)
MCH: 31.2 pg (ref 26.0–34.0)
MCHC: 32.9 g/dL (ref 30.0–36.0)
MCV: 95 fL (ref 78.0–100.0)
PLATELETS: 311 10*3/uL (ref 150–400)
RBC: 3.81 MIL/uL — AB (ref 3.87–5.11)
RDW: 13.2 % (ref 11.5–15.5)
WBC: 8.2 10*3/uL (ref 4.0–10.5)

## 2014-10-03 LAB — PROTIME-INR
INR: 1.03 (ref 0.00–1.49)
Prothrombin Time: 13.5 seconds (ref 11.6–15.2)

## 2014-10-03 LAB — GLUCOSE, CAPILLARY
GLUCOSE-CAPILLARY: 129 mg/dL — AB (ref 70–99)
Glucose-Capillary: 117 mg/dL — ABNORMAL HIGH (ref 70–99)

## 2014-10-03 SURGERY — ANGIOGRAM, LOWER EXTREMITY
Anesthesia: LOCAL

## 2014-10-03 MED ORDER — ONDANSETRON HCL 4 MG/2ML IJ SOLN
4.0000 mg | Freq: Once | INTRAMUSCULAR | Status: AC
  Administered 2014-10-03: 4 mg via INTRAVENOUS

## 2014-10-03 MED ORDER — HYDRALAZINE HCL 20 MG/ML IJ SOLN
INTRAMUSCULAR | Status: AC
  Filled 2014-10-03: qty 1

## 2014-10-03 MED ORDER — SODIUM CHLORIDE 0.9 % IV BOLUS (SEPSIS)
500.0000 mL | Freq: Once | INTRAVENOUS | Status: AC
  Administered 2014-10-03: 500 mL via INTRAVENOUS

## 2014-10-03 MED ORDER — HYDRALAZINE HCL 20 MG/ML IJ SOLN: 10.0000 mg | Freq: Once | INTRAMUSCULAR | Status: DC

## 2014-10-03 MED ORDER — CILOSTAZOL 100 MG PO TABS: 100.0000 mg | ORAL_TABLET | Freq: Two times a day (BID) | ORAL | Status: DC

## 2014-10-03 MED ORDER — MORPHINE SULFATE 2 MG/ML IJ SOLN
INTRAMUSCULAR | Status: AC
  Filled 2014-10-03: qty 1

## 2014-10-03 MED ORDER — MORPHINE SULFATE 4 MG/ML IJ SOLN
4.0000 mg | INTRAMUSCULAR | Status: DC | PRN
  Administered 2014-10-03: 2 mg via INTRAVENOUS

## 2014-10-03 MED ORDER — METFORMIN HCL 500 MG PO TABS: 500.0000 mg | ORAL_TABLET | Freq: Two times a day (BID) | ORAL | Status: DC

## 2014-10-03 MED ORDER — SODIUM CHLORIDE 0.9 % IV SOLN: INTRAVENOUS | Status: DC

## 2014-10-03 MED ORDER — SODIUM CHLORIDE 0.9 % IV SOLN: 1.0000 mL/kg/h | INTRAVENOUS | Status: DC

## 2014-10-03 MED ORDER — FENTANYL CITRATE 0.05 MG/ML IJ SOLN
INTRAMUSCULAR | Status: AC
  Filled 2014-10-03: qty 2

## 2014-10-03 MED ORDER — MIDAZOLAM HCL 2 MG/2ML IJ SOLN
INTRAMUSCULAR | Status: AC
  Filled 2014-10-03: qty 2

## 2014-10-03 MED ORDER — TRAMADOL-ACETAMINOPHEN 37.5-325 MG PO TABS: 1.0000 | ORAL_TABLET | Freq: Four times a day (QID) | ORAL | Status: DC | PRN

## 2014-10-03 MED ORDER — ONDANSETRON HCL 4 MG/2ML IJ SOLN
INTRAMUSCULAR | Status: AC
  Filled 2014-10-03: qty 2

## 2014-10-03 MED ORDER — LIDOCAINE HCL (PF) 1 % IJ SOLN
INTRAMUSCULAR | Status: AC
  Filled 2014-10-03: qty 30

## 2014-10-03 MED ORDER — HEPARIN (PORCINE) IN NACL 2-0.9 UNIT/ML-% IJ SOLN
INTRAMUSCULAR | Status: AC
  Filled 2014-10-03: qty 1000

## 2014-10-03 NOTE — Progress Notes (Signed)
Pt was given a total of 4mg  IV for hip pain

## 2014-10-03 NOTE — Discharge Instructions (Signed)

## 2014-10-03 NOTE — Progress Notes (Signed)
Site area:right groin a 5 french sheath was removed  Site Prior to Removal:  Level 0  Pressure Applied For 20 MINUTES    Minutes Beginning at 0900am  Manual:   Yes.    Patient Status During Pull:  stable  Post Pull Groin Site:  Level 0  Post Pull Instructions Given:  Yes.    Post Pull Pulses Present:  Yes.    Dressing Applied:  Yes.    Comments:  VS remain stable during sheath period. Pt denies any discomfort at sheath pull site.

## 2014-10-03 NOTE — CV Procedure (Signed)
Procedures performed:  1. right Femoral Arterial access 2. Abdominal aortogram, abdominal aortogram with bifemoral runoff.  3. Crossover from right femoral artery to the left superficial  femoral artery placement of catheter tip in the left common  femoral artery.  4. left femoral arteriogram with distal runoff  Indication: Claudication,  Abnormal LE arterial duplex. Hypertension, hyperlipidemia, uncontrolled diabetes mellitus.  Peripheral arthrogram: No evidence of abdominal aneurysm. 2 renal arteries one on either side and they're widely patent.   Aortoiliac bifurcation was widely patent. There is mild to moderate amount of atherosclerotic changes noted in the abdominal aorta.  Femoral arteriogram:  Left femoral artery with distal runoff: The left common femoral artery shows mild diffuse disease. The left SFA is severely diseased with multiple tandem chronic total occlusions. The SFA becomes a just above the knee via lateral the profunda femoral artery. At the site of occlusion, SFA gives collaterals to posterior tibial artery below the knee. The left profunda femoral artery gives extensive collaterals to the vessels below the knee.  Below the left knee, the peroneal artery shows severe diffuse disease proximally, filled by collaterals from the profunda femoral artery, however reaches all the way to the ankle and gives collaterals to the antecubital artery.  Left posterior tibial artery is occluded in the proximal and midsegment, filled by collateral from the occluded left SFA and also from collaterals from the profunda femoral artery crossing over to the right.  Angiogram of the foot reveals good perfusion to the foot, end-arteries to the foot appear healthy.  Right femoral artery with distal runoff: Right common femoral artery shows mild diffuse disease. The right SFA is severely diffusely diseased with high-grade tandem 80-95% stenoses. Just below the knee, the popliteal artery is occluded.  The posterior tibial artery reconstitutes by collaterals the proximal segment, and has excellent filling although the up to the ankle. The right peroneal artery is diffusely diseased and occluded in the proximal segment and faintly fills by collaterals in the mid and distal segment. Posterior tibial artery also reconstitutes at the level of the right foot.  Recommendation: Patient is severe diffuse disease of the lower extremity, not amenable for percutaneous revascularization. She will need femoral to posterior tibial artery bypass surgery for limb preservation if she were to the ulceration or limb threatening ischemia foot. Presently the left foot is warm although has edema there is no evidence of acute limb threatening ischemia, given the extensive nature of the surgery, uncontrolled diabetes, would recommend cardiac rehabilitation and diabetes control and have surgery is an option. I discussed her angiograms with Dr. Durene CalWells Brabham.  TECHNIQUE OF THE  PROCEDURE: Under sterile precautions using a 5-French right femoral arterial access, a 5 French Omniflush catheter was advanced  into the desscending aorta and arteriogram was performed. Same catheter was utilized to perform abdominal aortogram with distal runoff. 2 obtain selective visualization of the left femoral artery, I was able to cross into the left common femoral artery from the right.  Left Femoral arterial runoff: Left femoral arterial runoff was performed using the same catheter after crossing over from the right  femoral artery into the left femoral artery with the help of a Omni Flush over a Versacore wire. The catheter tip was positioned into the left external iliac artery and left femoral arteriogram was performed followed by withdrawal of the same cath into the right femoral artery over a Versacore wire. The catheter was then pulled out of the body over [employer]. There was no immediate complication. A  total of 141 cc of contrast was utilized  for diagnostic angiography.

## 2014-10-03 NOTE — Interval H&P Note (Signed)
History and Physical Interval Note:  10/03/2014 7:50 AM  Druscilla BrownieAnne W Markwood  has presented today for surgery, with the diagnosis of pvd with claudication  The various methods of treatment have been discussed with the patient and family. After consideration of risks, benefits and other options for treatment, the patient has consented to  Procedure(s): LOWER EXTREMITY ANGIOGRAM (N/A) and possible PTA as a surgical intervention .  The patient's history has been reviewed, patient examined, no change in status, stable for surgery.  I have reviewed the patient's chart and labs.  Questions were answered to the patient's satisfaction.     Pamella PertGANJI,JAGADEESH R

## 2014-10-03 NOTE — Progress Notes (Signed)
Pt complain of nausea.  Dr Jeanella CaraJ Ganji gave order for Zofran 4mg  IV which was given.

## 2014-10-09 NOTE — Progress Notes (Signed)
Patient no showed

## 2014-10-09 NOTE — Progress Notes (Signed)
This encounter was created in error - please disregard.

## 2014-10-13 ENCOUNTER — Encounter (HOSPITAL_BASED_OUTPATIENT_CLINIC_OR_DEPARTMENT_OTHER): Payer: Medicare Other | Attending: Plastic Surgery

## 2014-10-13 ENCOUNTER — Other Ambulatory Visit (HOSPITAL_COMMUNITY): Payer: Self-pay | Admitting: Plastic Surgery

## 2014-10-13 ENCOUNTER — Ambulatory Visit (HOSPITAL_COMMUNITY)
Admission: RE | Admit: 2014-10-13 | Discharge: 2014-10-13 | Disposition: A | Discharge status: Home / Self Care | Payer: Medicare Other | Source: Ambulatory Visit | Attending: Plastic Surgery | Admitting: Plastic Surgery

## 2014-10-13 DIAGNOSIS — I10 Essential (primary) hypertension: Secondary | ICD-10-CM | POA: Diagnosis present

## 2014-10-13 DIAGNOSIS — E11621 Type 2 diabetes mellitus with foot ulcer: Secondary | ICD-10-CM | POA: Insufficient documentation

## 2014-10-13 DIAGNOSIS — L97514 Non-pressure chronic ulcer of other part of right foot with necrosis of bone: Secondary | ICD-10-CM | POA: Insufficient documentation

## 2014-10-13 DIAGNOSIS — I739 Peripheral vascular disease, unspecified: Secondary | ICD-10-CM | POA: Diagnosis present

## 2014-10-13 DIAGNOSIS — M869 Osteomyelitis, unspecified: Secondary | ICD-10-CM | POA: Diagnosis not present

## 2014-10-13 DIAGNOSIS — I69354 Hemiplegia and hemiparesis following cerebral infarction affecting left non-dominant side: Secondary | ICD-10-CM | POA: Insufficient documentation

## 2014-10-13 DIAGNOSIS — L97519 Non-pressure chronic ulcer of other part of right foot with unspecified severity: Secondary | ICD-10-CM | POA: Insufficient documentation

## 2014-10-13 LAB — GLUCOSE, CAPILLARY: GLUCOSE-CAPILLARY: 133 mg/dL — AB (ref 70–99)

## 2014-10-14 NOTE — Progress Notes (Cosign Needed)
[facility]  NAME:  [name], Laurie [name]                ACCOUNT NO.:  [id number]  MEDICAL RECORD NO.:  [id number]      DATE OF BIRTH:  Jun 03, 1946  PHYSICIAN:  Ardath SaxPeter Jeovanny Cuadros, M.D.           VISIT DATE:                                  OFFICE VISIT   This is a 68 year old, African American female with severe peripheral vascular disease and hypertension.  Her medications include carvedilol, lisinopril, hydrochlorothiazide, gabapentin, metformin, tramadol, naproxen and modafinil.  This lady has a history of a mild left-sided stroke in the past, which left her with some weakness of the left hand. She has type 2 diabetes and of course, peripheral arterial disease.  I do not feel any pedal pulses at all.  She presents here with a diabetic ulcer on the tip of her right third toe.  It is classified as a Wagner 3 and involves the bone.  I debrided some of the distal phalanx that was sticking through the ulcer and I started her on doxycycline 100 b.i.d. and we also lined her up for a vascular consult and I am going to apply for hyperbaric oxygen treatments.  Today, we treated it with silver alginate and a toe sock.  Today when she came in, her blood pressure was 160/72, respirations 20, temperature 98.  She weighs 170 pounds and her blood sugar was 133.  So, her diagnoses are type 2 diabetes, hypertension, peripheral vascular disease, previous left cerebrovascular accident.     Ardath SaxPeter Oniyah Rohe, M.D.     PP/MEDQ  D:  10/13/2014  T:  10/14/2014  Job:  [id number]

## 2014-10-19 ENCOUNTER — Encounter: Payer: Self-pay | Admitting: Surgery

## 2014-10-19 ENCOUNTER — Other Ambulatory Visit: Payer: Self-pay | Admitting: *Deleted

## 2014-10-19 DIAGNOSIS — I739 Peripheral vascular disease, unspecified: Secondary | ICD-10-CM

## 2014-10-19 DIAGNOSIS — L97511 Non-pressure chronic ulcer of other part of right foot limited to breakdown of skin: Secondary | ICD-10-CM

## 2014-10-22 DIAGNOSIS — I639 Cerebral infarction, unspecified: Secondary | ICD-10-CM

## 2014-10-22 HISTORY — DX: Cerebral infarction, unspecified: I63.9

## 2014-10-25 ENCOUNTER — Encounter (HOSPITAL_BASED_OUTPATIENT_CLINIC_OR_DEPARTMENT_OTHER): Payer: Medicare Other | Attending: General Surgery

## 2014-10-25 DIAGNOSIS — L97519 Non-pressure chronic ulcer of other part of right foot with unspecified severity: Secondary | ICD-10-CM | POA: Insufficient documentation

## 2014-10-25 DIAGNOSIS — E11621 Type 2 diabetes mellitus with foot ulcer: Secondary | ICD-10-CM | POA: Insufficient documentation

## 2014-10-26 DIAGNOSIS — L97519 Non-pressure chronic ulcer of other part of right foot with unspecified severity: Secondary | ICD-10-CM | POA: Diagnosis not present

## 2014-10-26 DIAGNOSIS — E11621 Type 2 diabetes mellitus with foot ulcer: Secondary | ICD-10-CM | POA: Diagnosis not present

## 2014-10-26 LAB — GLUCOSE, CAPILLARY: GLUCOSE-CAPILLARY: 132 mg/dL — AB (ref 70–99)

## 2014-11-01 ENCOUNTER — Emergency Department (HOSPITAL_COMMUNITY): Payer: Medicare Other

## 2014-11-01 ENCOUNTER — Encounter (HOSPITAL_COMMUNITY): Payer: Self-pay | Admitting: Emergency Medicine

## 2014-11-01 ENCOUNTER — Inpatient Hospital Stay (HOSPITAL_COMMUNITY)
Admission: EM | Admit: 2014-11-01 | Discharge: 2014-11-07 | DRG: 617 | Disposition: A | Discharge status: Skilled Nursing Facility | Payer: Medicare Other | Attending: Internal Medicine | Admitting: Internal Medicine

## 2014-11-01 DIAGNOSIS — I5032 Chronic diastolic (congestive) heart failure: Secondary | ICD-10-CM | POA: Diagnosis present

## 2014-11-01 DIAGNOSIS — E1140 Type 2 diabetes mellitus with diabetic neuropathy, unspecified: Secondary | ICD-10-CM | POA: Diagnosis not present

## 2014-11-01 DIAGNOSIS — L03115 Cellulitis of right lower limb: Secondary | ICD-10-CM | POA: Diagnosis present

## 2014-11-01 DIAGNOSIS — E1165 Type 2 diabetes mellitus with hyperglycemia: Secondary | ICD-10-CM | POA: Diagnosis not present

## 2014-11-01 DIAGNOSIS — Z8673 Personal history of transient ischemic attack (TIA), and cerebral infarction without residual deficits: Secondary | ICD-10-CM | POA: Diagnosis not present

## 2014-11-01 DIAGNOSIS — I739 Peripheral vascular disease, unspecified: Secondary | ICD-10-CM

## 2014-11-01 DIAGNOSIS — I10 Essential (primary) hypertension: Secondary | ICD-10-CM | POA: Diagnosis present

## 2014-11-01 DIAGNOSIS — E1151 Type 2 diabetes mellitus with diabetic peripheral angiopathy without gangrene: Secondary | ICD-10-CM | POA: Diagnosis not present

## 2014-11-01 DIAGNOSIS — B999 Unspecified infectious disease: Secondary | ICD-10-CM

## 2014-11-01 DIAGNOSIS — T814XXA Infection following a procedure, initial encounter: Secondary | ICD-10-CM | POA: Diagnosis present

## 2014-11-01 DIAGNOSIS — F419 Anxiety disorder, unspecified: Secondary | ICD-10-CM

## 2014-11-01 DIAGNOSIS — Y835 Amputation of limb(s) as the cause of abnormal reaction of the patient, or of later complication, without mention of misadventure at the time of the procedure: Secondary | ICD-10-CM | POA: Diagnosis not present

## 2014-11-01 DIAGNOSIS — Z7982 Long term (current) use of aspirin: Secondary | ICD-10-CM | POA: Diagnosis not present

## 2014-11-01 DIAGNOSIS — L97509 Non-pressure chronic ulcer of other part of unspecified foot with unspecified severity: Secondary | ICD-10-CM

## 2014-11-01 DIAGNOSIS — T8149XA Infection following a procedure, other surgical site, initial encounter: Secondary | ICD-10-CM | POA: Diagnosis present

## 2014-11-01 DIAGNOSIS — E785 Hyperlipidemia, unspecified: Secondary | ICD-10-CM | POA: Diagnosis present

## 2014-11-01 DIAGNOSIS — I96 Gangrene, not elsewhere classified: Secondary | ICD-10-CM | POA: Diagnosis present

## 2014-11-01 DIAGNOSIS — Z880 Allergy status to penicillin: Secondary | ICD-10-CM | POA: Diagnosis not present

## 2014-11-01 DIAGNOSIS — Z7902 Long term (current) use of antithrombotics/antiplatelets: Secondary | ICD-10-CM

## 2014-11-01 DIAGNOSIS — M869 Osteomyelitis, unspecified: Secondary | ICD-10-CM | POA: Diagnosis not present

## 2014-11-01 DIAGNOSIS — Z79899 Other long term (current) drug therapy: Secondary | ICD-10-CM | POA: Diagnosis not present

## 2014-11-01 DIAGNOSIS — E088 Diabetes mellitus due to underlying condition with unspecified complications: Secondary | ICD-10-CM

## 2014-11-01 DIAGNOSIS — E876 Hypokalemia: Secondary | ICD-10-CM | POA: Diagnosis not present

## 2014-11-01 DIAGNOSIS — F17210 Nicotine dependence, cigarettes, uncomplicated: Secondary | ICD-10-CM | POA: Diagnosis present

## 2014-11-01 DIAGNOSIS — L97519 Non-pressure chronic ulcer of other part of right foot with unspecified severity: Secondary | ICD-10-CM | POA: Diagnosis not present

## 2014-11-01 DIAGNOSIS — E11621 Type 2 diabetes mellitus with foot ulcer: Principal | ICD-10-CM | POA: Diagnosis present

## 2014-11-01 DIAGNOSIS — Z89421 Acquired absence of other right toe(s): Secondary | ICD-10-CM | POA: Diagnosis not present

## 2014-11-01 DIAGNOSIS — E08621 Diabetes mellitus due to underlying condition with foot ulcer: Secondary | ICD-10-CM

## 2014-11-01 LAB — CBC WITH DIFFERENTIAL/PLATELET
BASOS ABS: 0 10*3/uL (ref 0.0–0.1)
BASOS PCT: 0 % (ref 0–1)
Basophils Absolute: 0 10*3/uL (ref 0.0–0.1)
Basophils Relative: 0 % (ref 0–1)
Eosinophils Absolute: 0 10*3/uL (ref 0.0–0.7)
Eosinophils Absolute: 0.1 10*3/uL (ref 0.0–0.7)
Eosinophils Relative: 0 % (ref 0–5)
Eosinophils Relative: 0 % (ref 0–5)
HCT: 29.7 % — ABNORMAL LOW (ref 36.0–46.0)
HCT: 27.1 % — ABNORMAL LOW (ref 36.0–46.0)
HEMOGLOBIN: 9.2 g/dL — AB (ref 12.0–15.0)
Hemoglobin: 9.8 g/dL — ABNORMAL LOW (ref 12.0–15.0)
LYMPHS ABS: 1.8 10*3/uL (ref 0.7–4.0)
LYMPHS PCT: 10 % — AB (ref 12–46)
Lymphocytes Relative: 7 % — ABNORMAL LOW (ref 12–46)
Lymphs Abs: 1.6 10*3/uL (ref 0.7–4.0)
MCH: 30.7 pg (ref 26.0–34.0)
MCH: 31.1 pg (ref 26.0–34.0)
MCHC: 33 g/dL (ref 30.0–36.0)
MCHC: 33.9 g/dL (ref 30.0–36.0)
MCV: 93.1 fL (ref 78.0–100.0)
MCV: 91.6 fL (ref 78.0–100.0)
MONOS PCT: 6 % (ref 3–12)
Monocytes Absolute: 1.2 10*3/uL — ABNORMAL HIGH (ref 0.1–1.0)
Monocytes Absolute: 1.1 10*3/uL — ABNORMAL HIGH (ref 0.1–1.0)
Monocytes Relative: 6 % (ref 3–12)
NEUTROS ABS: 18.6 10*3/uL — AB (ref 1.7–7.7)
NEUTROS ABS: 15.8 10*3/uL — AB (ref 1.7–7.7)
NEUTROS PCT: 84 % — AB (ref 43–77)
Neutrophils Relative %: 87 % — ABNORMAL HIGH (ref 43–77)
PLATELETS: 338 10*3/uL (ref 150–400)
PLATELETS: 315 10*3/uL (ref 150–400)
RBC: 3.19 MIL/uL — ABNORMAL LOW (ref 3.87–5.11)
RBC: 2.96 MIL/uL — ABNORMAL LOW (ref 3.87–5.11)
RDW: 12.9 % (ref 11.5–15.5)
RDW: 13 % (ref 11.5–15.5)
WBC: 21.4 10*3/uL — ABNORMAL HIGH (ref 4.0–10.5)
WBC: 18.8 10*3/uL — AB (ref 4.0–10.5)

## 2014-11-01 LAB — BASIC METABOLIC PANEL
ANION GAP: 16 — AB (ref 5–15)
BUN: 22 mg/dL (ref 6–23)
CALCIUM: 9.2 mg/dL (ref 8.4–10.5)
CHLORIDE: 100 meq/L (ref 96–112)
CO2: 21 mEq/L (ref 19–32)
Creatinine, Ser: 0.93 mg/dL (ref 0.50–1.10)
GFR calc non Af Amer: 63 mL/min — ABNORMAL LOW (ref >90)
GFR, EST AFRICAN AMERICAN: 73 mL/min — AB (ref >90)
Glucose, Bld: 299 mg/dL — ABNORMAL HIGH (ref 70–99)
Potassium: 2.8 mEq/L — CL (ref 3.7–5.3)
Sodium: 137 mEq/L (ref 137–147)

## 2014-11-01 LAB — I-STAT CG4 LACTIC ACID, ED
LACTIC ACID, VENOUS: 1.23 mmol/L (ref 0.5–2.2)
LACTIC ACID, VENOUS: 0.53 mmol/L (ref 0.5–2.2)

## 2014-11-01 LAB — CREATININE, SERUM
CREATININE: 0.83 mg/dL (ref 0.50–1.10)
GFR calc Af Amer: 84 mL/min — ABNORMAL LOW (ref >90)
GFR, EST NON AFRICAN AMERICAN: 72 mL/min — AB (ref >90)

## 2014-11-01 LAB — SEDIMENTATION RATE: SED RATE: 113 mm/h — AB (ref 0–22)

## 2014-11-01 LAB — ALBUMIN: ALBUMIN: 2 g/dL — AB (ref 3.5–5.2)

## 2014-11-01 LAB — GLUCOSE, CAPILLARY: Glucose-Capillary: 167 mg/dL — ABNORMAL HIGH (ref 70–99)

## 2014-11-01 MED ORDER — HEPARIN SODIUM (PORCINE) 5000 UNIT/ML IJ SOLN
5000.0000 [IU] | Freq: Three times a day (TID) | INTRAMUSCULAR | Status: DC
  Administered 2014-11-01 – 2014-11-07 (×16): 5000 [IU] via SUBCUTANEOUS
  Filled 2014-11-01 (×24): qty 1

## 2014-11-01 MED ORDER — LISINOPRIL 10 MG PO TABS
10.0000 mg | ORAL_TABLET | Freq: Every day | ORAL | Status: DC
  Administered 2014-11-01 – 2014-11-04 (×2): 10 mg via ORAL
  Filled 2014-11-01 (×4): qty 1

## 2014-11-01 MED ORDER — HYDROCODONE-ACETAMINOPHEN 5-325 MG PO TABS
1.0000 | ORAL_TABLET | Freq: Two times a day (BID) | ORAL | Status: DC
  Administered 2014-11-01 – 2014-11-07 (×11): 1 via ORAL
  Filled 2014-11-01 (×11): qty 1

## 2014-11-01 MED ORDER — INSULIN ASPART 100 UNIT/ML ~~LOC~~ SOLN
0.0000 [IU] | Freq: Every day | SUBCUTANEOUS | Status: DC
  Administered 2014-11-03: 3 [IU] via SUBCUTANEOUS

## 2014-11-01 MED ORDER — HYDROMORPHONE HCL 1 MG/ML IJ SOLN
0.5000 mg | INTRAMUSCULAR | Status: DC | PRN
  Administered 2014-11-01: 0.5 mg via INTRAVENOUS
  Filled 2014-11-01: qty 1

## 2014-11-01 MED ORDER — CEFTRIAXONE SODIUM IN DEXTROSE 40 MG/ML IV SOLN
2.0000 g | INTRAVENOUS | Status: DC
  Administered 2014-11-01 – 2014-11-04 (×4): 2 g via INTRAVENOUS
  Filled 2014-11-01 (×6): qty 50

## 2014-11-01 MED ORDER — INSULIN ASPART 100 UNIT/ML ~~LOC~~ SOLN
0.0000 [IU] | Freq: Three times a day (TID) | SUBCUTANEOUS | Status: DC
  Administered 2014-11-02: 3 [IU] via SUBCUTANEOUS
  Administered 2014-11-02: 5 [IU] via SUBCUTANEOUS
  Administered 2014-11-02: 3 [IU] via SUBCUTANEOUS
  Administered 2014-11-03: 2 [IU] via SUBCUTANEOUS
  Administered 2014-11-04 (×2): 7 [IU] via SUBCUTANEOUS

## 2014-11-01 MED ORDER — ACETAMINOPHEN 325 MG PO TABS
650.0000 mg | ORAL_TABLET | Freq: Four times a day (QID) | ORAL | Status: DC | PRN
Start: 2014-11-01 — End: 2014-11-07
  Administered 2014-11-05 – 2014-11-07 (×2): 650 mg via ORAL
  Filled 2014-11-01 (×2): qty 2

## 2014-11-01 MED ORDER — ACETAMINOPHEN 650 MG RE SUPP: 650.0000 mg | Freq: Four times a day (QID) | RECTAL | Status: DC | PRN

## 2014-11-01 MED ORDER — CILOSTAZOL 100 MG PO TABS
100.0000 mg | ORAL_TABLET | Freq: Two times a day (BID) | ORAL | Status: DC
Start: 2014-11-01 — End: 2014-11-07
  Administered 2014-11-01 – 2014-11-07 (×11): 100 mg via ORAL
  Filled 2014-11-01 (×13): qty 1

## 2014-11-01 MED ORDER — LISINOPRIL-HYDROCHLOROTHIAZIDE 10-12.5 MG PO TABS: 1.0000 | ORAL_TABLET | Freq: Every day | ORAL | Status: DC

## 2014-11-01 MED ORDER — HYDROCHLOROTHIAZIDE 12.5 MG PO CAPS
12.5000 mg | ORAL_CAPSULE | Freq: Every day | ORAL | Status: DC
  Administered 2014-11-01 – 2014-11-07 (×5): 12.5 mg via ORAL
  Filled 2014-11-01 (×7): qty 1

## 2014-11-01 MED ORDER — ALPRAZOLAM 0.25 MG PO TABS
0.2500 mg | ORAL_TABLET | Freq: Two times a day (BID) | ORAL | Status: DC | PRN
  Administered 2014-11-02 – 2014-11-07 (×6): 0.25 mg via ORAL
  Filled 2014-11-01 (×6): qty 1

## 2014-11-01 MED ORDER — SENNA 8.6 MG PO TABS
1.0000 | ORAL_TABLET | Freq: Two times a day (BID) | ORAL | Status: DC
  Administered 2014-11-01 – 2014-11-05 (×8): 8.6 mg via ORAL
  Filled 2014-11-01 (×13): qty 1

## 2014-11-01 MED ORDER — OXYCODONE HCL 5 MG PO TABS
5.0000 mg | ORAL_TABLET | ORAL | Status: DC | PRN
  Administered 2014-11-02 – 2014-11-07 (×10): 5 mg via ORAL
  Filled 2014-11-01 (×11): qty 1

## 2014-11-01 MED ORDER — VANCOMYCIN HCL IN DEXTROSE 1-5 GM/200ML-% IV SOLN: 1000.0000 mg | Freq: Once | INTRAVENOUS | Status: DC

## 2014-11-01 MED ORDER — VANCOMYCIN HCL IN DEXTROSE 750-5 MG/150ML-% IV SOLN
750.0000 mg | Freq: Two times a day (BID) | INTRAVENOUS | Status: DC
  Administered 2014-11-02 – 2014-11-04 (×5): 750 mg via INTRAVENOUS
  Filled 2014-11-01 (×7): qty 150

## 2014-11-01 MED ORDER — ATORVASTATIN CALCIUM 10 MG PO TABS
10.0000 mg | ORAL_TABLET | Freq: Every day | ORAL | Status: DC
  Administered 2014-11-02 – 2014-11-06 (×5): 10 mg via ORAL
  Filled 2014-11-01 (×6): qty 1

## 2014-11-01 MED ORDER — VANCOMYCIN HCL 10 G IV SOLR
1250.0000 mg | INTRAVENOUS | Status: DC
  Administered 2014-11-01: 1250 mg via INTRAVENOUS
  Filled 2014-11-01: qty 1250

## 2014-11-01 MED ORDER — POTASSIUM CHLORIDE 10 MEQ/100ML IV SOLN
10.0000 meq | Freq: Once | INTRAVENOUS | Status: AC
  Administered 2014-11-01: 10 meq via INTRAVENOUS
  Filled 2014-11-01: qty 100

## 2014-11-01 MED ORDER — ONDANSETRON HCL 4 MG/2ML IJ SOLN
4.0000 mg | Freq: Four times a day (QID) | INTRAMUSCULAR | Status: DC | PRN
  Administered 2014-11-02: 4 mg via INTRAVENOUS
  Filled 2014-11-01: qty 2

## 2014-11-01 MED ORDER — MODAFINIL 100 MG PO TABS
200.0000 mg | ORAL_TABLET | Freq: Two times a day (BID) | ORAL | Status: DC
  Administered 2014-11-01 – 2014-11-07 (×11): 200 mg via ORAL
  Filled 2014-11-01 (×11): qty 2

## 2014-11-01 MED ORDER — POTASSIUM CHLORIDE CRYS ER 20 MEQ PO TBCR
40.0000 meq | EXTENDED_RELEASE_TABLET | Freq: Once | ORAL | Status: AC
  Administered 2014-11-01: 40 meq via ORAL
  Filled 2014-11-01: qty 2

## 2014-11-01 MED ORDER — VANCOMYCIN HCL 10 G IV SOLR
1250.0000 mg | INTRAVENOUS | Status: DC
  Filled 2014-11-01: qty 1250

## 2014-11-01 MED ORDER — ONDANSETRON HCL 4 MG PO TABS: 4.0000 mg | ORAL_TABLET | Freq: Four times a day (QID) | ORAL | Status: DC | PRN

## 2014-11-01 MED ORDER — GABAPENTIN 300 MG PO CAPS
300.0000 mg | ORAL_CAPSULE | Freq: Three times a day (TID) | ORAL | Status: DC
  Administered 2014-11-01 – 2014-11-07 (×17): 300 mg via ORAL
  Filled 2014-11-01 (×22): qty 1

## 2014-11-01 MED ORDER — HYDROCHLOROTHIAZIDE 12.5 MG PO CAPS
12.5000 mg | ORAL_CAPSULE | Freq: Every day | ORAL | Status: DC
  Administered 2014-11-02: 12.5 mg via ORAL
  Filled 2014-11-01 (×2): qty 1

## 2014-11-01 MED ORDER — MORPHINE SULFATE 4 MG/ML IJ SOLN
4.0000 mg | Freq: Once | INTRAMUSCULAR | Status: AC
  Administered 2014-11-01: 4 mg via INTRAVENOUS
  Filled 2014-11-01: qty 1

## 2014-11-01 MED ORDER — LISINOPRIL 10 MG PO TABS
10.0000 mg | ORAL_TABLET | Freq: Every day | ORAL | Status: DC
  Administered 2014-11-02: 10 mg via ORAL
  Filled 2014-11-01: qty 1

## 2014-11-01 MED ORDER — DEXTROSE 5 % IV SOLN
2.0000 g | INTRAVENOUS | Status: AC
  Administered 2014-11-01: 2 g via INTRAVENOUS
  Filled 2014-11-01: qty 2

## 2014-11-01 MED ORDER — INFLUENZA VAC SPLIT QUAD 0.5 ML IM SUSY
0.5000 mL | PREFILLED_SYRINGE | Freq: Once | INTRAMUSCULAR | Status: AC
  Administered 2014-11-01: 0.5 mL via INTRAMUSCULAR
  Filled 2014-11-01: qty 0.5

## 2014-11-01 MED ORDER — METRONIDAZOLE IN NACL 5-0.79 MG/ML-% IV SOLN
500.0000 mg | Freq: Three times a day (TID) | INTRAVENOUS | Status: DC
  Administered 2014-11-01 – 2014-11-04 (×8): 500 mg via INTRAVENOUS
  Filled 2014-11-01 (×11): qty 100

## 2014-11-01 MED ORDER — ASPIRIN EC 81 MG PO TBEC
81.0000 mg | DELAYED_RELEASE_TABLET | Freq: Every day | ORAL | Status: DC
  Administered 2014-11-01 – 2014-11-07 (×6): 81 mg via ORAL
  Filled 2014-11-01 (×8): qty 1

## 2014-11-01 NOTE — ED Provider Notes (Signed)
CSN: [id number]     Arrival date & time 11/01/14  1423 History   None    Chief Complaint  Patient presents with  . Wound Infection     (Consider location/radiation/quality/duration/timing/severity/associated sxs/prior Treatment) HPI Comments: This is a 68 year old, African American female with severe peripheral vascular disease and hypertension, recent amputation of right 3rd toe sent from wound clinic with right foot infection.  Patient reports onset of discoloration 4-5 days ago, reports worsening.  Patient reports pain and swelling at the site with associated erythema and skin ulcers, skin color changes to green and black, malodorous discharge. Patient reports compliance with Doxycycline since 10/13/2014 Denies fever or chills. Patient had a wound culture performed 11/4, results show abundant Group B strep (S. Agalactiae).  Dr. Ardath SaxPeter Jaylei Fuerte performed surgery at the wound center.  NWG:NFAOZPCP:Garba  The history is provided by the patient. No language interpreter was used.    Past Medical History  Diagnosis Date  . Hypertension   . Diabetes mellitus   . Chest pain     Troponins up to 0.12 while hospitalized for E.coli PNA  . 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 Bacteremia   . Anemia   . Narcolepsy   . Stroke    No past surgical history on file. Family History  Problem Relation Age of Onset  . CAD Mother   . CAD Father    History  Substance Use Topics  . Smoking status: Current Every Day Smoker  . Smokeless tobacco: Not on file  . Alcohol Use: No   OB History    No data available     Review of Systems    Allergies  Penicillins  Home Medications   Prior to Admission medications   Medication Sig Start Date End Date Taking? Authorizing Provider  ALPRAZolam (XANAX) 0.25 MG tablet Take 0.25 mg by mouth 2 (two) times daily as needed for anxiety.    Historical Provider, MD  aspirin EC 81 MG tablet Take 81 mg by mouth daily.    Historical Provider, MD    atorvastatin (LIPITOR) 10 MG tablet Take 1 tablet (10 mg total) by mouth daily at 6 PM. 11/04/13   Evlyn KannerPamela S Love, PA-C  CHOLECALCIFEROL PO Take 1 tablet by mouth daily.    Historical Provider, MD  cilostazol (PLETAL) 100 MG tablet Take 1 tablet (100 mg total) by mouth 2 (two) times daily. 10/03/14   Pamella PertJagadeesh R Ganji, MD  co-enzyme Q-10 30 MG capsule Take 100 mg by mouth 2 (two) times daily.    Historical Provider, MD  gabapentin (NEURONTIN) 300 MG capsule Take 1 capsule (300 mg total) by mouth 2 (two) times daily. 03/13/14   Erick ColaceAndrew E Kirsteins, MD  HYDROcodone-acetaminophen (NORCO/VICODIN) 5-325 MG per tablet Take 1 tablet by mouth every 6 (six) hours as needed for moderate pain.    Historical Provider, MD  lisinopril-hydrochlorothiazide (PRINZIDE,ZESTORETIC) 10-12.5 MG per tablet Take 1 tablet by mouth daily.    Historical Provider, MD  metFORMIN (GLUCOPHAGE) 500 MG tablet Take 1 tablet (500 mg total) by mouth 2 (two) times daily with a meal. For diabetes 10/04/14   Pamella PertJagadeesh R Ganji, MD  Omega-3 Fatty Acids (ULTRA OMEGA 3 PO) Take 1 capsule by mouth daily.    Historical Provider, MD  traMADol-acetaminophen (ULTRACET) 37.5-325 MG per tablet Take 1 tablet by mouth every 6 (six) hours as needed. 10/03/14   Pamella PertJagadeesh R Ganji, MD   BP 160/72 mmHg  Pulse  86  Temp(Src) 97.6 F (36.4 C) (Oral)  Resp 18  Ht 5\' 7"  (1.702 m)  Wt 160 lb (72.576 kg)  BMI 25.05 kg/m2  SpO2 100% Physical Exam  Constitutional: She is oriented to person, place, and time. She appears well-developed and well-nourished. No distress.  HENT:  Head: Normocephalic and atraumatic.  Neck: Neck supple.  Cardiovascular: Normal rate and regular rhythm.   Pulmonary/Chest: Effort normal. No respiratory distress. She has no wheezes.  Abdominal: Soft. There is no tenderness. There is no rebound and no guarding.  Musculoskeletal:       Right foot: There is tenderness, bony tenderness and swelling.  Right foot: distal phalenx of 3rd  toe amputed, dark green-black skin changes noted from 3rd toe exstending to mid of the dorsum of foot.  Erythema and blistering with crepitus to ball of foot and instep of foot. Right foot swollen and red extending to distal tibia, tenderness.   Neurological: She is alert and oriented to person, place, and time.  Skin: Skin is warm.  Psychiatric: She has a normal mood and affect. Her behavior is normal.  Nursing note and vitals reviewed.   ED Course  Procedures (including critical care time) Labs Review Labs Reviewed  CBC WITH DIFFERENTIAL - Abnormal; Notable for the following:    WBC 21.4 (*)    RBC 3.19 (*)    Hemoglobin 9.8 (*)    HCT 29.7 (*)    Neutrophils Relative % 87 (*)    Neutro Abs 18.6 (*)    Lymphocytes Relative 7 (*)    Monocytes Absolute 1.2 (*)    All other components within normal limits  CULTURE, BLOOD (ROUTINE X 2)  CULTURE, BLOOD (ROUTINE X 2)  BASIC METABOLIC PANEL  URINALYSIS, ROUTINE W REFLEX MICROSCOPIC  I-STAT CG4 LACTIC ACID, ED    Imaging Review Dg Foot Complete Right  11/01/2014   CLINICAL DATA:  Right foot pain, swelling. Gangrene and right third toe. Diabetic.  EXAM: RIGHT FOOT COMPLETE - 3+ VIEW  COMPARISON:  10/13/2014  FINDINGS: There is been destruction of the right third toe middle and distal phalanges. Lucency also noted in the distal aspect of the proximal third phalanx compatible with osteomyelitis. Extensive subcutaneous gas within the midfoot and forefoot.  IMPRESSION: Destruction of the right third toe middle and distal phalanges as well as the distal aspect of the proximal phalanx. Diffuse soft tissue gas.   Electronically Signed   By: Charlett NoseKevin  Dover M.D.   On: 11/01/2014 16:21           EKG Interpretation None      MDM   Final diagnoses:  Infection  Osteomyelitis  Gangrene of foot  Hypokalemia   Pt presents  Approximately 1 week after distal amputation of phalanx of right foot, concern for worsening wound infection and  subcutaneous gas.  Pt compliance with doxycycline out-patient. Labs ordered, blood cultures ordered, XR ordered, consult to pharmacy for antibiotics. Vanc and Cefepime started. XR shows Destruction of the right third toe middle and distal phalanges as well as the distal aspect of the proximal phalanx. Diffuse softtissue gas. Discussed pt history and condition with Dr. Rubin PayorPickering. CBC shows leukocytosis at 21.4. BMP shows elevated glucose 299, and K 2.8 plant to replete orally and IV. Dr. Rubin PayorPickering also evaluated the pateint, Consulted Dr. Hart RochesterLawson, vascular, who [facility] and likely [facility] Friday. 5:25 PM Dr. Cena BentonVega to admit the patient.  Mellody DrownLauren Leyanna Bittman, PA-C 11/02/14 0021  Juliet Rude. Rubin Payor, MD 11/06/14 1422

## 2014-11-01 NOTE — Progress Notes (Addendum)
ANTIBIOTIC CONSULT NOTE - INITIAL  Pharmacy Consult for:  Vancomycin and Cefepime Indication:  Sepsis, right foot infection  Allergies  Allergen Reactions  . Penicillins Hives    Childhood allergy could have been more reaction only remembers hives.     Patient Measurements: Height: 5\' 7"  (170.2 cm) Weight: 160 lb (72.576 kg) IBW/kg (Calculated) : 61.6  Vital Signs: Temp: 97.6 F (36.4 C) (11/11 1427) Temp Source: Oral (11/11 1427) BP: 160/72 mmHg (11/11 1427) Pulse Rate: 86 (11/11 1427)  Labs: Estimated Creatinine Clearance: 68.2 mL/min (by C-G formula based on Cr of 0.79).  Microbiology: Blood cultures x 2 ordered  Medical History: Past Medical History  Diagnosis Date  . Hypertension   . Diabetes mellitus   . Chest pain     Troponins up to 0.12 while hospitalized for E.coli PNA  . 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 Bacteremia   . Anemia   . Narcolepsy   . Stroke     Medications:  Pending  Assessment:  Asked to assist with antibiotic therapy -- Vancomycin and Cefepime- for this 68 year-old female with diabetes, right foot infection, and sepsis.  As indicated in the medical record, the patient has severe peripheral vascular disease and is s/p amputation of the right 3rd toe. She has been taking doxycyline as an outpatient.  Patient was [facility] with a worsening infection of the right foot.  Goals of Therapy:   Eradication of infection  Vancomycin trough levels 15-20 mcg/ml  Plan:   Vancomycin 1250 mg x 1 dose initially, then 750 mg IV every 12 hours.  Cefepime 2 grams IV every 24 hours.  Polo Rileylaire Nikol Lemar R.Ph. 11/01/2014 5:42 PM

## 2014-11-01 NOTE — H&P (Signed)
[facility] History and Physical  Laurie Brownienne W Borre DDU:[id number] DOB: December 25, 1945 DOA: 11/01/2014  Referring physician: Dr. Rubin PayorPickering PCP: Lonia BloodGARBA,LAWAL, MD   Chief Complaint: right foot erythema and discomfort  HPI: Laurie [name] is a 68 y.o. female  With history of anxiety, diabetes mellitus, nicotine dependence, hypertension, peripheral vascular disease. She had recent amputation of third digit on right foot and she presents today roughly a week after her procedure complaining of worsening pain erythema and follow order at right foot. Wound care nurse evaluated patient and send patient to the hospital for further evaluation. Patient says the problem since onset has been persistent and is getting worse despite antibiotic use. Nothing she is aware of makes it better. Pressure makes the discomfort worse.  On evaluation patient has foul-smelling right foot wound with erythema and cellulitis. Case was discussed with vascular surgeon who is requesting admission [facility] for further evaluation recommendations. We are consulted for admission recommendations given multiple medical problems.   Review of Systems:  Constitutional:  No weight loss, night sweats, Fevers, chills, fatigue.  HEENT:  No headaches, Difficulty swallowing,Tooth/dental problems,Sore throat,  No sneezing, itching, ear ache, nasal congestion, post nasal drip,  Cardio-vascular:  No chest pain, Orthopnea, PND, swelling in lower extremities, anasarca, dizziness, palpitations  GI:  No heartburn, indigestion, abdominal pain, nausea, vomiting, diarrhea, change in bowel habits, loss of appetite  Resp:  No shortness of breath with exertion or at rest. No excess mucus, no productive cough, No non-productive cough, No coughing up of blood.No change in color of mucus.No wheezing.No chest wall deformity  Skin:  + rash and lesions.  GU:  no dysuria, change in color of urine, no urgency or frequency. No flank pain.    Musculoskeletal:  + joint pain or swelling. No decreased range of motion. No back pain.  Psych:  No change in mood or affect. No depression or anxiety. No memory loss.   Past Medical History  Diagnosis Date  . Hypertension   . Diabetes mellitus   . Chest pain     Troponins up to 0.12 while hospitalized for E.coli PNA  . 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 Bacteremia   . Anemia   . Narcolepsy   . Stroke    Past Surgical History  Procedure Laterality Date  . No past surgeries     Social History:  reports that she has been smoking Cigarettes.  She has a 9.75 pack-year smoking history. She has never used smokeless tobacco. She reports that she does not drink alcohol or use illicit drugs.  Allergies  Allergen Reactions  . Penicillins Hives    Childhood allergy could have been more reaction only remembers hives.     Family History  Problem Relation Age of Onset  . CAD Mother   . CAD Father      Prior to Admission medications   Medication Sig Start Date End Date Taking? Authorizing Provider  ALPRAZolam (XANAX) 0.25 MG tablet Take 0.25 mg by mouth 2 (two) times daily as needed for anxiety.   Yes Historical Provider, MD  aspirin EC 81 MG tablet Take 81 mg by mouth daily.   Yes Historical Provider, MD  atorvastatin (LIPITOR) 10 MG tablet Take 1 tablet (10 mg total) by mouth daily at 6 PM. 11/04/13  Yes Evlyn KannerPamela S Love, PA-C  CHOLECALCIFEROL PO Take 1 tablet by mouth daily.   Yes Historical Provider, MD  clopidogrel (PLAVIX) 75  MG tablet Take 75 mg by mouth daily.   Yes Historical Provider, MD  co-enzyme Q-10 30 MG capsule Take 100 mg by mouth daily.    Yes Historical Provider, MD  doxycycline (VIBRAMYCIN) 100 MG capsule Take 100 mg by mouth 2 (two) times daily.   Yes Historical Provider, MD  gabapentin (NEURONTIN) 300 MG capsule Take 1 capsule (300 mg total) by mouth 2 (two) times daily. Patient taking differently: Take 300 mg by mouth 3 (three) times daily.   03/13/14  Yes Erick Colace, MD  HYDROcodone-acetaminophen (NORCO/VICODIN) 5-325 MG per tablet Take 1 tablet by mouth 2 (two) times daily.    Yes Historical Provider, MD  lisinopril-hydrochlorothiazide (PRINZIDE,ZESTORETIC) 20-25 MG per tablet Take 1 tablet by mouth daily.   Yes Historical Provider, MD  modafinil (PROVIGIL) 200 MG tablet Take 200 mg by mouth 2 (two) times daily.   Yes Historical Provider, MD  naproxen (NAPROSYN) 500 MG tablet Take 500 mg by mouth 2 (two) times daily with a meal.   Yes Historical Provider, MD  Omega-3 Fatty Acids (ULTRA OMEGA 3 PO) Take 1 capsule by mouth daily.   Yes Historical Provider, MD  traMADol-acetaminophen (ULTRACET) 37.5-325 MG per tablet Take 1 tablet by mouth every 6 (six) hours as needed. 10/03/14  Yes Pamella Pert, MD  cilostazol (PLETAL) 100 MG tablet Take 1 tablet (100 mg total) by mouth 2 (two) times daily. 10/03/14   Pamella Pert, MD  lisinopril-hydrochlorothiazide (PRINZIDE,ZESTORETIC) 10-12.5 MG per tablet Take 1 tablet by mouth daily.    Historical Provider, MD  metFORMIN (GLUCOPHAGE) 500 MG tablet Take 1 tablet (500 mg total) by mouth 2 (two) times daily with a meal. For diabetes 10/04/14   Pamella Pert, MD   Physical Exam: Filed Vitals:   11/01/14 1427 11/01/14 1644 11/01/14 1649 11/01/14 1704  BP: 160/72   154/67  Pulse: 86   77  Temp: 97.6 F (36.4 C)  98.2 F (36.8 C)   TempSrc: Oral  Oral   Resp: 18   17  Height: 5\' 7"  (1.702 m)     Weight: 72.576 kg (160 lb) 72.576 kg (160 lb)    SpO2: 100%   98%    Wt Readings from Last 3 Encounters:  11/01/14 72.576 kg (160 lb)  10/03/14 74.39 kg (164 lb)  05/29/14 77.565 kg (171 lb)    General:  Appears calm and comfortable Eyes: PERRL, normal lids, irises & conjunctiva ENT: grossly normal hearing, lips & tongue Neck: no LAD, masses or thyromegaly Cardiovascular: RRR, no m/r/g.  Telemetry: SR, no arrhythmias  Respiratory: CTA bilaterally, no w/r/r. Normal  respiratory effort. Abdomen: soft, ntnd Skin: erythema and cellulitis present at right foot. Also there is a necrotic area at dorsum and plantar side of right foot near recent amputation of toe 3 Musculoskeletal: erythema at right lower extremity at plantar and dorsal side of right foot. No pedal pulses present on exam.  Psychiatric: grossly normal mood and affect, speech fluent and appropriate Neurologic: answers questions appropriately, no facial asymmetry          Labs on Admission:  Basic Metabolic Panel:  Recent Labs Lab 11/01/14 1539  NA 137  K 2.8*  CL 100  CO2 21  GLUCOSE 299*  BUN 22  CREATININE 0.93  CALCIUM 9.2   Liver Function Tests: No results for input(s): AST, ALT, ALKPHOS, BILITOT, PROT, ALBUMIN in the last 168 hours. No results for input(s): LIPASE, AMYLASE in the last 168 hours. No  results for input(s): AMMONIA in the last 168 hours. CBC:  Recent Labs Lab 11/01/14 1539  WBC 21.4*  NEUTROABS 18.6*  HGB 9.8*  HCT 29.7*  MCV 93.1  PLT 338   Cardiac Enzymes: No results for input(s): CKTOTAL, CKMB, CKMBINDEX, TROPONINI in the last 168 hours.  BNP (last 3 results) No results for input(s): PROBNP in the last 8760 hours. CBG:  Recent Labs Lab 10/26/14 1457  GLUCAP 132*    Radiological Exams on Admission: Dg Foot Complete Right  11/01/2014   CLINICAL DATA:  Right foot pain, swelling. Gangrene and right third toe. Diabetic.  EXAM: RIGHT FOOT COMPLETE - 3+ VIEW  COMPARISON:  10/13/2014  FINDINGS: There is been destruction of the right third toe middle and distal phalanges. Lucency also noted in the distal aspect of the proximal third phalanx compatible with osteomyelitis. Extensive subcutaneous gas within the midfoot and forefoot.  IMPRESSION: Destruction of the right third toe middle and distal phalanges as well as the distal aspect of the proximal phalanx. Diffuse soft tissue gas.   Electronically Signed   By: Charlett NoseKevin  Dover M.D.   On: 11/01/2014 16:21      EKG: Independently reviewed. Sinus rhythm with no ST elevations or depressions  Assessment/Plan Active Problems:   Wound, surgical, infected/Diabetic foot ulcer - will place routine diabetic foot ulcer admission order set. Cover with Rocephin IV metronidazole and vancomycin - [facility] surgeon request. ED physician discuss case with vascular surgeon Dr. Hart RochesterLawson - we'll hold Plavix as patient will most likely require operation.  DM - We'll hold metformin - Diabetic diet - sliding scale insulin  Anxiety - Stable at this juncture we'll continue home Xanax regimen.  Peripheral vascular disease -please refer to PV procedure note by Dr. Jacinto HalimGanji on 10/03/2014 - we'll continue cilostazol  CVA - No new focal neurological deficits reported. We'll continue aspirin at this juncture. - continue statin and blood pressure medications.  Code Status: full DVT Prophylaxis: Place on heparin Family Communication: discussed with patient and family member at bedside Disposition Plan: [facility]. Vascular to evaluate patient.  Time spent: > 45 minutes  Penny PiaVEGA, Sheliah Fiorillo [facility] Pager [phone]

## 2014-11-01 NOTE — ED Notes (Signed)
Patient is on the bedpan now, attempting to provide a urine sample.

## 2014-11-01 NOTE — ED Notes (Signed)
[facility] called for pt transport  

## 2014-11-01 NOTE — ED Notes (Signed)
Pt from wound care center from Dr. Jimmey RalphParker.  Pt sent here for gangrene of Rt 3rd toe that has spread to foot.

## 2014-11-02 DIAGNOSIS — I739 Peripheral vascular disease, unspecified: Secondary | ICD-10-CM

## 2014-11-02 DIAGNOSIS — L97509 Non-pressure chronic ulcer of other part of unspecified foot with unspecified severity: Secondary | ICD-10-CM

## 2014-11-02 DIAGNOSIS — L97514 Non-pressure chronic ulcer of other part of right foot with necrosis of bone: Secondary | ICD-10-CM

## 2014-11-02 DIAGNOSIS — T814XXD Infection following a procedure, subsequent encounter: Secondary | ICD-10-CM

## 2014-11-02 LAB — GLUCOSE, CAPILLARY
Glucose-Capillary: 246 mg/dL — ABNORMAL HIGH (ref 70–99)
Glucose-Capillary: 235 mg/dL — ABNORMAL HIGH (ref 70–99)
Glucose-Capillary: 255 mg/dL — ABNORMAL HIGH (ref 70–99)
Glucose-Capillary: 194 mg/dL — ABNORMAL HIGH (ref 70–99)

## 2014-11-02 LAB — C-REACTIVE PROTEIN: CRP: 35.3 mg/dL — AB (ref <0.60)

## 2014-11-02 LAB — HEMOGLOBIN A1C
HEMOGLOBIN A1C: 7.9 % — AB (ref <5.7)
MEAN PLASMA GLUCOSE: 180 mg/dL — AB (ref <117)

## 2014-11-02 LAB — MRSA PCR SCREENING: MRSA BY PCR: NEGATIVE

## 2014-11-02 LAB — HIV ANTIBODY (ROUTINE TESTING W REFLEX): HIV: NONREACTIVE

## 2014-11-02 MED ORDER — HYDROMORPHONE HCL 1 MG/ML IJ SOLN
1.0000 mg | INTRAMUSCULAR | Status: DC | PRN
  Administered 2014-11-02 – 2014-11-07 (×16): 1 mg via INTRAVENOUS
  Filled 2014-11-02 (×18): qty 1

## 2014-11-02 MED ORDER — PRO-STAT SUGAR FREE PO LIQD
30.0000 mL | Freq: Three times a day (TID) | ORAL | Status: DC
  Administered 2014-11-04 – 2014-11-07 (×11): 30 mL via ORAL
  Filled 2014-11-02 (×16): qty 30

## 2014-11-02 MED ORDER — GLUCERNA SHAKE PO LIQD
237.0000 mL | Freq: Two times a day (BID) | ORAL | Status: DC
  Administered 2014-11-04 – 2014-11-07 (×6): 237 mL via ORAL
  Filled 2014-11-02 (×4): qty 237

## 2014-11-02 MED ORDER — INSULIN GLARGINE 100 UNIT/ML ~~LOC~~ SOLN
12.0000 [IU] | Freq: Every day | SUBCUTANEOUS | Status: DC
Start: 2014-11-02 — End: 2014-11-03
  Administered 2014-11-02: 12 [IU] via SUBCUTANEOUS
  Filled 2014-11-02: qty 0.12

## 2014-11-02 NOTE — Consult Note (Signed)
Vascular Surgery Consultation  Reason for Consult: gangrene right foot  HPI: Laurie [name] is a 68 y.o. female who presents for evaluation of gangrene right foot. This patient with diabetes mellitus [facility] with progressive gangrene of the distal aspect of the right foot. She has been followed in the wound center by Dr. Ardath SaxPeter Parker. She apparently had her third toe amputated in the recent past-a few weeks ago although I'm unable to find an operative note for this procedure. She had angiography performed by Dr. Jacinto HalimGanji 10/03/2014 which revealed diffuse superficial femoral and tibial occlusive disease. 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 She denies chills and fever at home.   Past Medical History  Diagnosis Date  . Hypertension   . Diabetes mellitus   . Chest pain     Troponins up to 0.12 while hospitalized for E.coli PNA  . 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 Bacteremia   . Anemia   . Narcolepsy   . Stroke    Past Surgical History  Procedure Laterality Date  . No past surgeries     History   Social History  . Marital Status: Divorced    Spouse Name: N/A    Number of Children: N/A  . Years of Education: N/A   Social History Main Topics  . Smoking status: Current Every Day Smoker -- 0.25 packs/day for 39 years    Types: Cigarettes  . Smokeless tobacco: Never Used  . Alcohol Use: No  . Drug Use: No  . Sexual Activity: None   Other Topics Concern  . None   Social History Narrative   She lives with her sister and they do not have a good relationship.   Family History  Problem Relation Age of Onset  . CAD Mother   . CAD Father    Allergies  Allergen Reactions  . Penicillins Hives    Childhood allergy could have been more reaction only remembers hives.    Prior to Admission medications    Medication Sig Start Date End Date Taking? Authorizing Provider  ALPRAZolam (XANAX) 0.25 MG tablet Take 0.25 mg by mouth 2 (two) times daily as needed for anxiety.   Yes Historical Provider, MD  aspirin EC 81 MG tablet Take 81 mg by mouth daily.   Yes Historical Provider, MD  atorvastatin (LIPITOR) 10 MG tablet Take 1 tablet (10 mg total) by mouth daily at 6 PM. 11/04/13  Yes Evlyn KannerPamela S Love, PA-C  CHOLECALCIFEROL PO Take 1 tablet by mouth daily.   Yes Historical Provider, MD  clopidogrel (PLAVIX) 75 MG tablet Take 75 mg by mouth daily.   Yes Historical Provider, MD  co-enzyme Q-10 30 MG capsule Take 100 mg by mouth daily.    Yes Historical Provider, MD  doxycycline (VIBRAMYCIN) 100 MG capsule Take 100 mg by mouth 2 (two) times daily.   Yes Historical Provider, MD  gabapentin (NEURONTIN) 300 MG capsule Take 1 capsule (300 mg total) by mouth 2 (two) times daily. Patient taking differently: Take 300 mg by mouth 3 (three) times daily.  03/13/14  Yes Erick ColaceAndrew E Kirsteins, MD  HYDROcodone-acetaminophen (NORCO/VICODIN) 5-325 MG per tablet Take 1 tablet by mouth 2 (two) times daily.    Yes Historical Provider, MD  lisinopril-hydrochlorothiazide (PRINZIDE,ZESTORETIC) 20-25 MG per tablet Take 1 tablet by mouth daily.  Yes Historical Provider, MD  modafinil (PROVIGIL) 200 MG tablet Take 200 mg by mouth 2 (two) times daily.   Yes Historical Provider, MD  naproxen (NAPROSYN) 500 MG tablet Take 500 mg by mouth 2 (two) times daily with a meal.   Yes Historical Provider, MD  Omega-3 Fatty Acids (ULTRA OMEGA 3 PO) Take 1 capsule by mouth daily.   Yes Historical Provider, MD  traMADol-acetaminophen (ULTRACET) 37.5-325 MG per tablet Take 1 tablet by mouth every 6 (six) hours as needed. 10/03/14  Yes Pamella PertJagadeesh R Ganji, MD  cilostazol (PLETAL) 100 MG tablet Take 1 tablet (100 mg total) by mouth 2 (two) times daily. 10/03/14   Pamella PertJagadeesh R Ganji, MD  lisinopril-hydrochlorothiazide (PRINZIDE,ZESTORETIC) 10-12.5 MG per  tablet Take 1 tablet by mouth daily.    Historical Provider, MD  metFORMIN (GLUCOPHAGE) 500 MG tablet Take 1 tablet (500 mg total) by mouth 2 (two) times daily with a meal. For diabetes 10/04/14   Pamella PertJagadeesh R Ganji, MD     Positive ROS: denies chest pain, dyspnea on exertion, PND, orthopnea, hemoptysis. Does have history of right brain CVA as noted above.  All other systems have been reviewed and were otherwise negative with the exception of those mentioned in the HPI and as above.  Physical Exam: Filed Vitals:   11/02/14 0630  BP: 119/66  Pulse: 92  Temp: 98.9 F (37.2 C)  Resp: 16    General: Alert, no acute distress HEENT: Normal for age Cardiovascular: Regular rate and rhythm. Carotid pulses 2+, no bruits audible Respiratory: Clear to auscultation. No cyanosis, no use of accessory musculature GI: No organomegaly, abdomen is soft and non-tender Skin: No lesions in the area of chief complaint Neurologic: Sensation intact distally Psychiatric: Patient is competent for consent with normal mood and affect Musculoskeletal: No obvious deformities Extremities: right lower extremity with 3+ femoral absent popliteal and absent distal pulses. There is extensive gangrene with full-thickness necrosis on the dorsal aspect of the right foot the base of the second and third toes extending back about 6 cm. There is also evidence of deep foot infection with blistering of the skin on the plantar surface medially. No proximal erythema noted. No crepitance noted. Left leg with 3+ femoral absent popliteal and distal pulses and no evidence of ischemia gangrene or ulceration    Imaging reviewed: angiograms from 10/03/2014 were reviewed which reveal right superficial femoral artery to be patent but diffusely diseased with occlusion of the below-knee popliteal artery and reconstitution of posterior tibial and anterior tibial arteries distally but all vessels are diseased   Assessment/Plan:  Patient  has nonsalvageable right foot with severe diffuse superficial femoral popliteal and tibial occlusive disease She needs either right below knee or above-knee amputation. She may heal a below-knee amputation. It is her dominant lower extremity and she has had a stroke involving the left side  Will try right BKA with possible AKA in a.m. Discussed this with patient and she agrees and is ready to proceed.   Josephina GipJames Maikel Neisler, MD 11/02/2014 9:23 AM

## 2014-11-02 NOTE — Plan of Care (Signed)
Problem: Phase I Progression Outcomes Goal: Voiding-avoid urinary catheter unless indicated Outcome: Completed/Met Date Met:  11/02/14     

## 2014-11-02 NOTE — Progress Notes (Addendum)
PROGRESS NOTE  Druscilla Brownienne W Esson ZOX:[id number] DOB: 11-08-1946 DOA: 11/01/2014 PCP: Lonia BloodGARBA,LAWAL, MD  HPI/Subjective: 68 year old female with PHM significant for DM, anxiety, HTN, PVD, and nicotine dependence is admitted roughly one week after Right 3rd digit amputation. Patient is found to have gangrene.     Assessment/Plan: Surgical would infection/Gangrene: Likely secondary PVD and DM2. Seen by WOC who noted that the patent has gangrene. X ray showed soft tissue gas. Per vascular surgery she has a nonsalvageable right foot and will need a right BKA and possible AKA. Patient is afebrile and tearful about plan of treatment.  Plan is for surgery tomorrow. Started on Rocephin, Flagyl, Vancomycin. Analgesics increased.   HTN: Chronic. Moderate control. BP max 168/67. Continue home medication and monitor BP.   DM type 2: Chronic. CBG on 11/12 246. Continue home medications for glucose management and diabetic neuropathy.   Anxiety: Continue home Xanex PRN   History of CVA: Previous CVA in early 2015 which resulted in some left-sided weakness.  She currently ambulates with a walking stick or walker.   Chronic diastolic heart failure grade 2: Stable- no signs of acute heart failure. Echo in 2014 showed LV EF 55/60% and a grade 2 diastolic dysfunction. Continue home medications.   PVD: Secondary to uncontrolled DM, HTN, and age. Angiography performed by Dr. Jacinto HalimGanji 10/03/2014 which revealed diffuse superficial femoral and tibial occlusive disease. Continue home medications.   Hyperlipidemia: Chronic. Continue home medications.     DVT Prophylaxis:  Heparin   Code Status: Full Family Communication: None. Patient is alert and agreeable to plan of care.  Disposition Plan: Remains inpatient. SNF when medically appropriate.    Consultants:  WOC   Vascular Surgery  Pharmacy  Procedures:  Right BKA/ AKA planned for 11/13  Antibiotics:  Rocephin 11/11>>  Flagyl  11/11>>  Vancomycin>>   Objective: Filed Vitals:   11/01/14 2001 11/01/14 2034 11/02/14 0630 11/02/14 1453  BP: 149/68 168/67 119/66 145/57  Pulse: 83 81 92 102  Temp: 98 F (36.7 C) 98.5 F (36.9 C) 98.9 F (37.2 C) 99 F (37.2 C)  TempSrc:  Oral Oral Oral  Resp: 20 18 16 17   Height:      Weight:      SpO2: 100% 98% 96% 97%    Intake/Output Summary (Last 24 hours) at 11/02/14 1549 Last data filed at 11/02/14 1455  Gross per 24 hour  Intake    462 ml  Output    900 ml  Net   -438 ml   Filed Weights   11/01/14 1427 11/01/14 1644  Weight: 72.576 kg (160 lb) 72.576 kg (160 lb)    Exam: General: Patient is tearful, Well developed, well nourished, appears stated age  HEENT:  EOMI, Anicteic Sclera, MMM.  Neck: Supple, no JVD, no masses  Cardiovascular: RRR, S1 S2 auscultated, no rubs, murmurs or gallops.   Respiratory: Clear to auscultation bilaterally with equal chest rise  Abdomen: Soft, nontender, nondistended, + bowel sounds  Extremities: Right foot is bloated with areas of necrotized tissue. Fowl smelling odor. 2 cm foot ulcer present on the dorsal aspect of the ball of the foot.   Psych: Patient understands and agrees to the plan of care but is tearful about the amputation.    Data Reviewed: Basic Metabolic Panel:  Recent Labs Lab 11/01/14 1539 11/01/14 2154  NA 137  --   K 2.8*  --   CL 100  --   CO2 21  --  GLUCOSE 299*  --   BUN 22  --   CREATININE 0.93 0.83  CALCIUM 9.2  --    Liver Function Tests:  Recent Labs Lab 11/01/14 2154  ALBUMIN 2.0*   CBC:  Recent Labs Lab 11/01/14 1539 11/01/14 2154  WBC 21.4* 18.8*  NEUTROABS 18.6* 15.8*  HGB 9.8* 9.2*  HCT 29.7* 27.1*  MCV 93.1 91.6  PLT 338 315   CBG:  Recent Labs Lab 11/01/14 2136 11/02/14 0754 11/02/14 1155  GLUCAP 167* 246* 235*    Recent Results (from the past 240 hour(s))  Culture, blood (routine x 2)     Status: None (Preliminary result)   Collection Time: 11/01/14   3:27 PM  Result Value Ref Range Status   Specimen Description BLOOD LEFT FOREARM  Final   Special Requests BOTTLES DRAWN AEROBIC AND ANAEROBIC 3ML  Final   Culture  Setup Time   Final    11/01/2014 21:25 Performed at [facility]    Culture   Final           BLOOD CULTURE RECEIVED NO GROWTH TO DATE CULTURE WILL BE HELD FOR 5 DAYS BEFORE ISSUING A FINAL NEGATIVE REPORT Performed at [facility]    Report Status PENDING  Incomplete  Culture, blood (routine x 2)     Status: None (Preliminary result)   Collection Time: 11/01/14  3:43 PM  Result Value Ref Range Status   Specimen Description BLOOD RIGHT FOREARM  Final   Special Requests BOTTLES DRAWN AEROBIC AND ANAEROBIC 5CC  Final   Culture  Setup Time   Final    11/01/2014 21:23 Performed at [facility]    Culture   Final           BLOOD CULTURE RECEIVED NO GROWTH TO DATE CULTURE WILL BE HELD FOR 5 DAYS BEFORE ISSUING A FINAL NEGATIVE REPORT Performed at [facility]    Report Status PENDING  Incomplete     Studies: Dg Foot Complete Right  11/01/2014   CLINICAL DATA:  Right foot pain, swelling. Gangrene and right third toe. Diabetic.  EXAM: RIGHT FOOT COMPLETE - 3+ VIEW  COMPARISON:  10/13/2014  FINDINGS: There is been destruction of the right third toe middle and distal phalanges. Lucency also noted in the distal aspect of the proximal third phalanx compatible with osteomyelitis. Extensive subcutaneous gas within the midfoot and forefoot.  IMPRESSION: Destruction of the right third toe middle and distal phalanges as well as the distal aspect of the proximal phalanx. Diffuse soft tissue gas.   Electronically Signed   By: Charlett NoseKevin  Dover M.D.   On: 11/01/2014 16:21    Scheduled Meds: . aspirin EC  81 mg Oral Daily  . atorvastatin  10 mg Oral q1800  . cefTRIAXone (ROCEPHIN)  IV  2 g Intravenous Q24H   And  . metronidazole  500 mg Intravenous Q8H  . cilostazol  100 mg Oral BID  . feeding supplement  (GLUCERNA SHAKE)  237 mL Oral BID BM  . feeding supplement (PRO-STAT SUGAR FREE 64)  30 mL Oral TID BM  . gabapentin  300 mg Oral TID  . heparin  5,000 Units Subcutaneous 3 times per day  . hydrochlorothiazide  12.5 mg Oral Daily  . hydrochlorothiazide  12.5 mg Oral Daily  . HYDROcodone-acetaminophen  1 tablet Oral BID  . insulin aspart  0-5 Units Subcutaneous QHS  . insulin aspart  0-9 Units Subcutaneous TID WC  . insulin glargine  12 Units  Subcutaneous QHS  . lisinopril  10 mg Oral Daily  . lisinopril  10 mg Oral Daily  . modafinil  200 mg Oral BID  . senna  1 tablet Oral BID  . vancomycin  750 mg Intravenous Q12H   Continuous Infusions:   Principal Problem:   Gangrene Active Problems:   Peripheral vascular disease   Diabetes   Chronic diastolic heart failure, grade 2   Dyslipidemia   Wound, surgical, infected   Diabetic foot ulcer    Eddie Candle PA-S [facility] Pager [phone]. If 7PM-7AM, please contact night-coverage at [email or web address], password [facility] 11/02/2014, 3:49 PM  LOS: 1 day    Addendum  I personally saw and examined patient on 11/02/2014, agree with the above findings. Patient is a pleasant 68 year old female with a past medical history of hypertension, type 2 diabetes mellitus, peripheral vascular disease who was admitted to the medicine service on 11/01/2014. Patient having a history of third toe amputation to right lower extremity, presented with progressive gangrene  Involving her right foot. On presentation she was found to have gangrene associated with necrosis involving dorsal aspect of right foot associated with foul smell. Plain film of her foot showed destruction of the right third toe middle and distal phalanges as well as distal aspect of proximal phalanx. Also noted was diffuse soft tissue gas. She was started on empiric IV antibiotic therapy with ceftriaxone, Flagyl, vancomycin. Patient was evaluated by vascular surgery who has recommended BKA  versus AKA. During my evaluation she is hemodynamically stable, right foot foul-smelling with extensive necrosis involving dorsal aspect of right foot. Pedal pulses were absent bilaterally. Patient's blood sugars elevated in the 200 range today, have added Lantus 12 units subcutaneous daily, continue Accu-Cheks before every meal daily at bedtime with sliding scale coverage.

## 2014-11-02 NOTE — Progress Notes (Signed)
INITIAL NUTRITION ASSESSMENT  DOCUMENTATION CODES Per approved criteria  -Not Applicable   INTERVENTION: Provide Glucerna Shake po BID, each supplement provides 220 kcal and 10 grams of protein.  Provide 30 ml Prostat po TID, each supplement provides 100 kcal and 15 grams of protein.  Encourage adequate PO intake.  NUTRITION DIAGNOSIS: Increased nutrient needs related to wounds as evidenced by estimated nutrition needs.   Goal: Pt to meet >/= 90% of their estimated nutrition needs   Monitor:  PO intake, weight trends, labs, I/O's  Reason for Assessment: MD consult for wound healing  68 y.o. female  Admitting Dx: Wound Surgical, infected  ASSESSMENT: With history of anxiety, diabetes mellitus, nicotine dependence, hypertension, peripheral vascular disease. She had recent amputation of third digit on right foot and she presents today roughly a week after her procedure complaining of worsening pain erythema and follow order at right foot. On evaluation patient has foul-smelling right foot wound with erythema and cellulitis.  Per MD note, will try right BKA with possible AKA in a.m.  Pt reports a lack of appetite, which has been present over the past 2 weeks. Pt reports she still has been eating well at home with 2 full meals a day, which is her normal intake. Pt reports her weight has been fluctuating, but over all stable, with no significant weight loss. Pt was educated that a increase in protein is important currently to help with wound healing. Pt is agreeable to Prostat and Glucerna. Will order. Pt was encouraged to eat her food at meals and to take her supplements to help with healing.   Unable to perform nutrition focused physical exam during time of visit as pt reports extreme pain.   Labs: Low GFR. CBG's 167-299 mg/dL  Height: Ht Readings from Last 1 Encounters:  11/01/14 5\' 7"  (1.702 m)    Weight: Wt Readings from Last 1 Encounters:  11/01/14 160 lb (72.576 kg)     Ideal Body Weight: 135 lbs  % Ideal Body Weight: 119%  Wt Readings from Last 10 Encounters:  11/01/14 160 lb (72.576 kg)  10/03/14 164 lb (74.39 kg)  05/29/14 171 lb (77.565 kg)  03/13/14 176 lb (79.833 kg)  02/02/14 175 lb (79.379 kg)  01/19/14 177 lb (80.287 kg)  01/10/14 172 lb (78.019 kg)  12/02/13 176 lb (79.833 kg)  11/02/13 168 lb 3.2 oz (76.295 kg)  10/25/13 176 lb 9.4 oz (80.1 kg)    Usual Body Weight: 170 lbs  % Usual Body Weight: 94%  BMI:  Body mass index is 25.05 kg/(m^2).  Estimated Nutritional Needs: Kcal: 1900-2200 Protein: 87-100 grams Fluid: 2 - 2.2 L/day  Skin: Diabetic ulcer on right toe, +2 LE edema  Diet Order:  Carb Modified  EDUCATION NEEDS: -Education needs addressed   Intake/Output Summary (Last 24 hours) at 11/02/14 1014 Last data filed at 11/02/14 0601  Gross per 24 hour  Intake      0 ml  Output    600 ml  Net   -600 ml    Last BM: 11/11  Labs:   Recent Labs Lab 11/01/14 1539 11/01/14 2154  NA 137  --   K 2.8*  --   CL 100  --   CO2 21  --   BUN 22  --   CREATININE 0.93 0.83  CALCIUM 9.2  --   GLUCOSE 299*  --     CBG (last 3)   Recent Labs  11/01/14 2136 11/02/14 0754  GLUCAP 167*  246*    Scheduled Meds: . aspirin EC  81 mg Oral Daily  . atorvastatin  10 mg Oral q1800  . cefTRIAXone (ROCEPHIN)  IV  2 g Intravenous Q24H   And  . metronidazole  500 mg Intravenous Q8H  . cilostazol  100 mg Oral BID  . gabapentin  300 mg Oral TID  . heparin  5,000 Units Subcutaneous 3 times per day  . hydrochlorothiazide  12.5 mg Oral Daily  . hydrochlorothiazide  12.5 mg Oral Daily  . HYDROcodone-acetaminophen  1 tablet Oral BID  . insulin aspart  0-5 Units Subcutaneous QHS  . insulin aspart  0-9 Units Subcutaneous TID WC  . lisinopril  10 mg Oral Daily  . lisinopril  10 mg Oral Daily  . modafinil  200 mg Oral BID  . senna  1 tablet Oral BID  . vancomycin  750 mg Intravenous Q12H    Continuous Infusions:    Past Medical History  Diagnosis Date  . Hypertension   . Diabetes mellitus   . Chest pain     Troponins up to 0.12 while hospitalized for E.coli PNA  . 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 Bacteremia   . Anemia   . Narcolepsy   . Stroke     Past Surgical History  Procedure Laterality Date  . No past surgeries      Marijean NiemannStephanie La, MS, RD, LDN Pager # [phone] After hours/ weekend pager # [phone]

## 2014-11-02 NOTE — Progress Notes (Signed)
Inpatient Diabetes Program Recommendations  AACE/ADA: New Consensus Statement on Inpatient Glycemic Control (2013)  Target Ranges:  Prepandial:   less than 140 mg/dL      Peak postprandial:   less than 180 mg/dL (1-2 hours)      Critically ill patients:  140 - 180 mg/dL   Reason for Visit: Diabetes Consult   Diabetes history: DM2 Outpatient Diabetes medications: metformin 500 mg bid Current orders for Inpatient glycemic control: Lantus 12 units QHS, Novolog sensitive tidwc and hs  Results for Laurie [name], Laurie [name] (MRN [id number]) as of 11/02/2014 15:40  Ref. Range 11/01/2014 21:54  Hgb A1c MFr Bld Latest Range: <5.7 % 7.9 (H)  Results for Laurie [name], Laurie [name] (MRN [id number]) as of 11/02/2014 15:40  Ref. Range 10/13/2014 10:11 10/26/2014 14:57 11/01/2014 21:36 11/02/2014 07:54 11/02/2014 11:55  Glucose-Capillary Latest Range: 70-99 mg/dL 865133 (H) 784132 (H) 696167 (H) 246 (H) 235 (H)   Needs tight control for wound healing.  Will need prescription for glucose monitoring at home.  Needs basal insulin - Just ordered.  Inpatient Diabetes Program Recommendations Insulin - Basal: Consider addition of Lantus 10 units QHS HgbA1C: 7.9% - uncontrolled  Note: Will continue to follow. Thank you. Ailene Ardshonda Livian Vanderbeck, RD, LDN, CDE Inpatient Diabetes Coordinator [phone]

## 2014-11-02 NOTE — Progress Notes (Signed)
Received call from lab-- positive blood culture 1 aerobic bottle +gram cocci and clusters. Notified K.kirby,NP via text paged and no new order received.

## 2014-11-02 NOTE — Progress Notes (Signed)
[facility]  NAME:  Laurie [name], Laurie [name]                ACCOUNT NO.:  [id number]  MEDICAL RECORD NO.:  [id number]      DATE OF BIRTH:  07/04/1946  PHYSICIAN:  Ardath SaxPeter Dryden Tapley, M.D.           VISIT DATE:                                  OFFICE VISIT   This is a 68 year old, African American female who is a type 2 diabetic and has severe peripheral vascular disease.  She has been coming here for a couple of weeks because of gangrenous changes on her right third toe.  This has been debrided on multiple occasions and she has been on doxycycline.  Today, she returns and the daughter states that the foot has become more swollen and when I looked at it, it has obviously got advancing cellulitis involving the dorsum of her foot extending up from the third toe with some gangrenous changes in the skin.  I felt that she had to go right to the emergency room for admission to the hospital for IV antibiotics and of course, surgical revision of this gangrenous foot. I think she will have to have a transmetatarsal amputation.  I explained this to the patient and the daughter and we sent them immediately to the emergency department for evaluation and admission.     Ardath SaxPeter Rawad Bochicchio, M.D.     PP/MEDQ  D:  11/01/2014  T:  11/02/2014  Job:  [id number]

## 2014-11-02 NOTE — Consult Note (Signed)
WOC consult requested for foot; pt has gangrene and soft tissue gas according to X-ray.  Physician at the outpatient wound care center has recommended amputation as soon as possible.These problems are beyond the scope of WOC.  Please consult [facility] or ortho team for further plan of care, and re-consult if further assistance is needed.  Thank-you,  Cammie Mcgeeawn Naiyana Barbian MSN, RN, CWOCN, [place]AP, CNS [phone]

## 2014-11-03 ENCOUNTER — Encounter (HOSPITAL_COMMUNITY)
Admission: EM | Disposition: A | Discharge status: Skilled Nursing Facility | Payer: Self-pay | Source: Home / Self Care | Attending: Internal Medicine

## 2014-11-03 ENCOUNTER — Inpatient Hospital Stay (HOSPITAL_COMMUNITY): Payer: Medicare Other | Admitting: Anesthesiology

## 2014-11-03 DIAGNOSIS — I96 Gangrene, not elsewhere classified: Secondary | ICD-10-CM

## 2014-11-03 DIAGNOSIS — I70261 Atherosclerosis of native arteries of extremities with gangrene, right leg: Secondary | ICD-10-CM

## 2014-11-03 DIAGNOSIS — E785 Hyperlipidemia, unspecified: Secondary | ICD-10-CM

## 2014-11-03 HISTORY — PX: AMPUTATION: SHX166

## 2014-11-03 LAB — CBC
HEMATOCRIT: 26.3 % — AB (ref 36.0–46.0)
HEMOGLOBIN: 9.1 g/dL — AB (ref 12.0–15.0)
MCH: 31.2 pg (ref 26.0–34.0)
MCHC: 34.6 g/dL (ref 30.0–36.0)
MCV: 90.1 fL (ref 78.0–100.0)
Platelets: 341 10*3/uL (ref 150–400)
RBC: 2.92 MIL/uL — ABNORMAL LOW (ref 3.87–5.11)
RDW: 13 % (ref 11.5–15.5)
WBC: 20.3 10*3/uL — ABNORMAL HIGH (ref 4.0–10.5)

## 2014-11-03 LAB — GLUCOSE, CAPILLARY
GLUCOSE-CAPILLARY: 153 mg/dL — AB (ref 70–99)
Glucose-Capillary: 155 mg/dL — ABNORMAL HIGH (ref 70–99)
Glucose-Capillary: 144 mg/dL — ABNORMAL HIGH (ref 70–99)
Glucose-Capillary: 158 mg/dL — ABNORMAL HIGH (ref 70–99)
Glucose-Capillary: 189 mg/dL — ABNORMAL HIGH (ref 70–99)
Glucose-Capillary: 298 mg/dL — ABNORMAL HIGH (ref 70–99)

## 2014-11-03 LAB — BASIC METABOLIC PANEL
Anion gap: 14 (ref 5–15)
BUN: 15 mg/dL (ref 6–23)
CO2: 23 mEq/L (ref 19–32)
Calcium: 8.5 mg/dL (ref 8.4–10.5)
Chloride: 102 mEq/L (ref 96–112)
Creatinine, Ser: 0.91 mg/dL (ref 0.50–1.10)
GFR calc Af Amer: 75 mL/min — ABNORMAL LOW (ref >90)
GFR, EST NON AFRICAN AMERICAN: 65 mL/min — AB (ref >90)
GLUCOSE: 178 mg/dL — AB (ref 70–99)
POTASSIUM: 3.1 meq/L — AB (ref 3.7–5.3)
Sodium: 139 mEq/L (ref 137–147)

## 2014-11-03 SURGERY — AMPUTATION BELOW KNEE
Anesthesia: General | Site: Leg Lower | Laterality: Right

## 2014-11-03 MED ORDER — HYDROMORPHONE HCL 1 MG/ML IJ SOLN
INTRAMUSCULAR | Status: AC
  Administered 2014-11-03: 1 mg
  Filled 2014-11-03: qty 1

## 2014-11-03 MED ORDER — POTASSIUM CHLORIDE CRYS ER 20 MEQ PO TBCR
40.0000 meq | EXTENDED_RELEASE_TABLET | Freq: Four times a day (QID) | ORAL | Status: AC
  Administered 2014-11-03: 40 meq via ORAL
  Filled 2014-11-03: qty 2

## 2014-11-03 MED ORDER — LIDOCAINE HCL (CARDIAC) 20 MG/ML IV SOLN
INTRAVENOUS | Status: DC | PRN
  Administered 2014-11-03: 20 mg via INTRAVENOUS

## 2014-11-03 MED ORDER — ONDANSETRON HCL 4 MG/2ML IJ SOLN
INTRAMUSCULAR | Status: DC | PRN
  Administered 2014-11-03: 4 mg via INTRAVENOUS

## 2014-11-03 MED ORDER — INSULIN GLARGINE 100 UNIT/ML ~~LOC~~ SOLN
15.0000 [IU] | Freq: Every day | SUBCUTANEOUS | Status: DC
  Administered 2014-11-03: 15 [IU] via SUBCUTANEOUS
  Filled 2014-11-03 (×2): qty 0.15

## 2014-11-03 MED ORDER — DIPHENHYDRAMINE HCL 50 MG/ML IJ SOLN
INTRAMUSCULAR | Status: DC | PRN
  Administered 2014-11-03: 10 mg via INTRAVENOUS

## 2014-11-03 MED ORDER — LACTATED RINGERS IV SOLN
INTRAVENOUS | Status: DC | PRN
  Administered 2014-11-03 (×2): via INTRAVENOUS

## 2014-11-03 MED ORDER — PHENYLEPHRINE HCL 10 MG/ML IJ SOLN
10.0000 mg | INTRAVENOUS | Status: DC | PRN
  Administered 2014-11-03: 20 ug/min via INTRAVENOUS

## 2014-11-03 MED ORDER — PROMETHAZINE HCL 25 MG/ML IJ SOLN: 6.2500 mg | INTRAMUSCULAR | Status: DC | PRN

## 2014-11-03 MED ORDER — LACTATED RINGERS IV SOLN
INTRAVENOUS | Status: DC
  Administered 2014-11-03 – 2014-11-05 (×3): via INTRAVENOUS

## 2014-11-03 MED ORDER — MIDAZOLAM HCL 2 MG/2ML IJ SOLN: 0.5000 mg | Freq: Once | INTRAMUSCULAR | Status: DC | PRN

## 2014-11-03 MED ORDER — LIDOCAINE HCL (CARDIAC) 20 MG/ML IV SOLN
INTRAVENOUS | Status: AC
  Filled 2014-11-03: qty 5

## 2014-11-03 MED ORDER — MEPERIDINE HCL 25 MG/ML IJ SOLN: 6.2500 mg | INTRAMUSCULAR | Status: DC | PRN

## 2014-11-03 MED ORDER — 0.9 % SODIUM CHLORIDE (POUR BTL) OPTIME
TOPICAL | Status: DC | PRN
  Administered 2014-11-03: 1000 mL

## 2014-11-03 MED ORDER — DEXAMETHASONE SODIUM PHOSPHATE 4 MG/ML IJ SOLN
INTRAMUSCULAR | Status: AC
  Filled 2014-11-03: qty 2

## 2014-11-03 MED ORDER — FENTANYL CITRATE 0.05 MG/ML IJ SOLN
INTRAMUSCULAR | Status: AC
  Filled 2014-11-03: qty 5

## 2014-11-03 MED ORDER — OXYCODONE HCL 5 MG PO TABS: 5.0000 mg | ORAL_TABLET | Freq: Once | ORAL | Status: DC | PRN

## 2014-11-03 MED ORDER — ONDANSETRON HCL 4 MG/2ML IJ SOLN
INTRAMUSCULAR | Status: AC
  Filled 2014-11-03: qty 2

## 2014-11-03 MED ORDER — DEXAMETHASONE SODIUM PHOSPHATE 10 MG/ML IJ SOLN
INTRAMUSCULAR | Status: DC | PRN
  Administered 2014-11-03: 8 mg via INTRAVENOUS

## 2014-11-03 MED ORDER — SUCCINYLCHOLINE CHLORIDE 20 MG/ML IJ SOLN
INTRAMUSCULAR | Status: AC
  Filled 2014-11-03: qty 1

## 2014-11-03 MED ORDER — FENTANYL CITRATE 0.05 MG/ML IJ SOLN
INTRAMUSCULAR | Status: DC | PRN
  Administered 2014-11-03 (×5): 50 ug via INTRAVENOUS

## 2014-11-03 MED ORDER — PROPOFOL 10 MG/ML IV BOLUS
INTRAVENOUS | Status: DC | PRN
  Administered 2014-11-03: 30 mg via INTRAVENOUS
  Administered 2014-11-03: 150 mg via INTRAVENOUS

## 2014-11-03 MED ORDER — OXYCODONE HCL 5 MG/5ML PO SOLN: 5.0000 mg | Freq: Once | ORAL | Status: DC | PRN

## 2014-11-03 MED ORDER — HYDROMORPHONE HCL 1 MG/ML IJ SOLN
0.2500 mg | INTRAMUSCULAR | Status: DC | PRN
  Administered 2014-11-03: 0.25 mg via INTRAVENOUS

## 2014-11-03 MED ORDER — PROPOFOL 10 MG/ML IV BOLUS
INTRAVENOUS | Status: AC
  Filled 2014-11-03: qty 20

## 2014-11-03 MED ORDER — DIPHENHYDRAMINE HCL 50 MG/ML IJ SOLN: 10.0000 mg | Freq: Once | INTRAMUSCULAR | Status: DC

## 2014-11-03 SURGICAL SUPPLY — 50 items
BANDAGE ELASTIC 6 VELCRO ST LF (GAUZE/BANDAGES/DRESSINGS) ×2 IMPLANT
BANDAGE ESMARK 6X9 LF (GAUZE/BANDAGES/DRESSINGS) IMPLANT
BLADE SAW RECIP 87.9 MT (BLADE) ×2 IMPLANT
BNDG CMPR 9X6 STRL LF SNTH (GAUZE/BANDAGES/DRESSINGS) ×1
BNDG COHESIVE 6X5 TAN STRL LF (GAUZE/BANDAGES/DRESSINGS) ×2 IMPLANT
BNDG ESMARK 6X9 LF (GAUZE/BANDAGES/DRESSINGS) ×2
BNDG GAUZE ELAST 4 BULKY (GAUZE/BANDAGES/DRESSINGS) ×3 IMPLANT
CANISTER SUCTION 2500CC (MISCELLANEOUS) ×2 IMPLANT
CLIP TI MEDIUM 6 (CLIP) IMPLANT
COVER SURGICAL LIGHT HANDLE (MISCELLANEOUS) ×2 IMPLANT
COVER TABLE BACK 60X90 (DRAPES) ×2 IMPLANT
CUFF TOURNIQUET SINGLE 18IN (TOURNIQUET CUFF) IMPLANT
CUFF TOURNIQUET SINGLE 24IN (TOURNIQUET CUFF) ×1 IMPLANT
CUFF TOURNIQUET SINGLE 34IN LL (TOURNIQUET CUFF) IMPLANT
CUFF TOURNIQUET SINGLE 44IN (TOURNIQUET CUFF) IMPLANT
DRAIN CHANNEL 19F RND (DRAIN) IMPLANT
DRAPE ORTHO SPLIT 77X108 STRL (DRAPES) ×4
DRAPE PROXIMA HALF (DRAPES) ×4 IMPLANT
DRAPE SURG ORHT 6 SPLT 77X108 (DRAPES) ×2 IMPLANT
DRSG ADAPTIC 3X8 NADH LF (GAUZE/BANDAGES/DRESSINGS) ×2 IMPLANT
DRSG PAD ABDOMINAL 8X10 ST (GAUZE/BANDAGES/DRESSINGS) ×1 IMPLANT
ELECT REM PT RETURN 9FT ADLT (ELECTROSURGICAL) ×2
ELECTRODE REM PT RTRN 9FT ADLT (ELECTROSURGICAL) ×1 IMPLANT
EVACUATOR SILICONE 100CC (DRAIN) IMPLANT
GAUZE SPONGE 4X4 12PLY STRL (GAUZE/BANDAGES/DRESSINGS) ×4 IMPLANT
GLOVE BIO SURGEON STRL SZ7.5 (GLOVE) ×2 IMPLANT
GOWN STRL REUS W/ TWL LRG LVL3 (GOWN DISPOSABLE) ×3 IMPLANT
GOWN STRL REUS W/TWL LRG LVL3 (GOWN DISPOSABLE) ×6
KIT BASIN OR (CUSTOM PROCEDURE TRAY) ×2 IMPLANT
KIT ROOM TURNOVER OR (KITS) ×2 IMPLANT
NS IRRIG 1000ML POUR BTL (IV SOLUTION) ×2 IMPLANT
PACK GENERAL/GYN (CUSTOM PROCEDURE TRAY) ×2 IMPLANT
PAD ARMBOARD 7.5X6 YLW CONV (MISCELLANEOUS) ×4 IMPLANT
PADDING CAST COTTON 6X4 STRL (CAST SUPPLIES) ×1 IMPLANT
SPONGE GAUZE 4X4 12PLY STER LF (GAUZE/BANDAGES/DRESSINGS) ×1 IMPLANT
STAPLER VISISTAT 35W (STAPLE) ×2 IMPLANT
STOCKINETTE IMPERVIOUS LG (DRAPES) ×2 IMPLANT
SUT ETHILON 3 0 PS 1 (SUTURE) IMPLANT
SUT SILK 2 0 (SUTURE) ×2
SUT SILK 2 0 SH CR/8 (SUTURE) ×4 IMPLANT
SUT SILK 2-0 18XBRD TIE 12 (SUTURE) ×1 IMPLANT
SUT VIC AB 2-0 CT1 27 (SUTURE) ×4
SUT VIC AB 2-0 CT1 TAPERPNT 27 (SUTURE) ×2 IMPLANT
SUT VIC AB 2-0 SH 18 (SUTURE) ×3 IMPLANT
SUT VIC AB 3-0 SH 27 (SUTURE) ×2
SUT VIC AB 3-0 SH 27X BRD (SUTURE) ×2 IMPLANT
TOWEL OR 17X24 6PK STRL BLUE (TOWEL DISPOSABLE) ×2 IMPLANT
TOWEL OR 17X26 10 PK STRL BLUE (TOWEL DISPOSABLE) ×2 IMPLANT
UNDERPAD 30X30 INCONTINENT (UNDERPADS AND DIAPERS) ×2 IMPLANT
WATER STERILE IRR 1000ML POUR (IV SOLUTION) ×1 IMPLANT

## 2014-11-03 NOTE — Care Management Note (Signed)
  Page 1 of 1   11/06/2014     3:47:19 PM CARE MANAGEMENT NOTE 11/06/2014  Patient:  Laurie [name],Laurie [name]   Account Number:  [id number]  Date Initiated:  11/03/2014  Documentation initiated by:  Ronny FlurryWILE,Jacqulyne Gladue  Subjective/Objective Assessment:     Action/Plan:   Anticipated DC Date:     Anticipated DC Plan:           Choice offered to / List presented to:             Status of service:   Medicare Important Message given?  YES (If response is "NO", the following Medicare IM given date fields will be blank) Date Medicare IM given:  11/03/2014 Medicare IM given by:  Ronny FlurryWILE,Alaysha Jefcoat Date Additional Medicare IM given:  11/06/2014 Additional Medicare IM given by:  Ronny FlurryHEATHER Cyruss Arata  Discharge Disposition:    Per UR Regulation:    If discussed at Long Length of Stay Meetings, dates discussed:    Comments:

## 2014-11-03 NOTE — Anesthesia Preprocedure Evaluation (Addendum)
Anesthesia Evaluation  Patient identified by MRN, date of birth, ID band Patient awake    Reviewed: Allergy & Precautions, H&P , NPO status   History of Anesthesia Complications Negative for: history of anesthetic complications  Airway Mallampati: II  TM Distance: >3 FB Neck ROM: Full    Dental  (+) Edentulous Upper, Edentulous Lower   Pulmonary Current Smoker,  breath sounds clear to auscultation        Cardiovascular hypertension, Pt. on medications - angina+ Peripheral Vascular Disease Rhythm:Regular Rate:Normal  '14 ECHO: EF 55-60%, grade 2 diastolic dysfunction, mild MR   Neuro/Psych Anxiety CVA    GI/Hepatic negative GI ROS, Neg liver ROS,   Endo/Other  diabetes (glu 178), Oral Hypoglycemic Agents  Renal/GU negative Renal ROS     Musculoskeletal   Abdominal   Peds  Hematology  (+) Blood dyscrasia (Hb 9.1), ,   Anesthesia Other Findings   Reproductive/Obstetrics                            Anesthesia Physical Anesthesia Plan  ASA: III  Anesthesia Plan: General   Post-op Pain Management:    Induction: Intravenous  Airway Management Planned: LMA  Additional Equipment:   Intra-op Plan:   Post-operative Plan:   Informed Consent: I have reviewed the patients History and Physical, chart, labs and discussed the procedure including the risks, benefits and alternatives for the proposed anesthesia with the patient or authorized representative who has indicated his/her understanding and acceptance.   Dental advisory given  Plan Discussed with: CRNA and Surgeon  Anesthesia Plan Comments: (Plan routine monitors, GA- LMA OK)        Anesthesia Quick Evaluation

## 2014-11-03 NOTE — Op Note (Signed)
Procedure: Right below-knee amputation  Preoperative diagnosis: Gangrene right foot  Postoperative diagnosis: Same  Anesthesia Gen.  Assistant: Lianne CureMaureen Collins PA-C  Upper findings: #1 calcified vessels with reasonably well perfused muscle tissue  Operative details: After obtaining informed consent, the patient was taken to the operating room. The patient was placed in supine position on the operating table. After induction of general anesthesia and endotracheal intubation, the patient's entire right lower extremity was prepped and draped all the way down to the level of the ankle. The leg was exsanguinated with an Esmarch.  A tourniquet was then inflated to 300 mm Hg.  Next a transverse incision was made approximately 4 fingerbreadths below the tibial tuberosity on the right leg.  The  incision was carried posteriorly to the midportion of the leg and then extended longitudinally to create a posterior flap. The subcutaneous tissues and fascia was taken down with cautery. Periosteum was raised on the tibia approximately 5 cm above the skin edge.  The periosteum was also raised on the fibula several centimeters above this. The tibia was then divided with a saw. The fibula was divided with a bone cutter. The leg was then elevated in the operative field and the amputation was completed posterior to the bones with an amputation knife. Hemostasis was then obtained with cautery and several suture ligatures. The tibial and sural nerves were pulled down into the field transected and allowed to retract up into the leg.  After hemostasis was obtained, the wound was thoroughly irrigated with  normal saline solution. The fascial edges were then reapproximated with interrupted 2-0 Vicryl sutures. Subcutaneous tissues were reapproximated using running 3-0 Vicryl suture. The skin was closed with staples. The patient tolerated the procedure well and there were no complications. Instrument sponge and  needle counts were  correct at the end of the case. The patient was taken to the recovery room in stable condition.  Fabienne Brunsharles Fields, MD [facility] [place] Office: [phone] Pager: [phone]

## 2014-11-03 NOTE — Anesthesia Procedure Notes (Signed)
Procedure Name: LMA Insertion Date/Time: 11/03/2014 11:15 AM Performed by: Coralee RudFLORES, Martisha Toulouse Pre-anesthesia Checklist: Patient identified, Emergency Drugs available, Suction available and Patient being monitored Patient Re-evaluated:Patient Re-evaluated prior to inductionOxygen Delivery Method: Circle system utilized Preoxygenation: Pre-oxygenation with 100% oxygen Intubation Type: IV induction Ventilation: Mask ventilation without difficulty LMA: LMA inserted LMA Size: 4.0 Number of attempts: 1 Placement Confirmation: ETT inserted through vocal cords under direct vision,  positive ETCO2 and breath sounds checked- equal and bilateral

## 2014-11-03 NOTE — Transfer of Care (Signed)
Immediate Anesthesia Transfer of Care Note  Patient: Laurie [name]  Procedure(s) Performed: Procedure(s): AMPUTATION BELOW KNEE (Right)  Patient Location: PACU  Anesthesia Type:General  Level of Consciousness: awake, sedated and patient cooperative  Airway & Oxygen Therapy: Patient Spontanous Breathing and Patient connected to face mask oxygen  Post-op Assessment: Report given to PACU RN, Post -op Vital signs reviewed and stable and Patient moving all extremities  Post vital signs: Reviewed and stable  Complications: No apparent anesthesia complications

## 2014-11-03 NOTE — Anesthesia Postprocedure Evaluation (Signed)
  Anesthesia Post-op Note  Patient: Laurie [name]  Procedure(s) Performed: Procedure(s): AMPUTATION BELOW KNEE (Right)  Patient Location: PACU  Anesthesia Type:General  Level of Consciousness: awake, alert , oriented and patient cooperative  Airway and Oxygen Therapy: Patient Spontanous Breathing and Patient connected to nasal cannula oxygen  Post-op Pain: mild  Post-op Assessment: Post-op Vital signs reviewed, Patient's Cardiovascular Status Stable, Respiratory Function Stable, Patent Airway, No signs of Nausea or vomiting and Pain level controlled  Post-op Vital Signs: Reviewed and stable  Last Vitals:  Filed Vitals:   11/03/14 1304  BP:   Pulse:   Temp: 36.7 C  Resp:     Complications: No apparent anesthesia complications

## 2014-11-03 NOTE — Progress Notes (Signed)
PROGRESS NOTE  Laurie [name] JYN:[id number] DOB: February 05, 1946 DOA: 11/01/2014 PCP: Lonia Blood, MD  HPI/Subjective: Patient is a 68 year old AA female with PHM significant for DM, anxiety, HTN, PVD, and nicotine dependence is admitted roughly one week after Right 3rd digit amputation for surgical site infection. Patient was found to have gangrene.     Assessment/Plan: Surgical would infection/Gangrene: Likely secondary PVD and DM2. Seen by WOC who noted that the patent has gangrene. Plain film of right foot shows destruction of the right third toe middle and distal phalanges as well as distal aspect of proximal phalanx with soft tissue gas. Per vascular surgery she has a nonsalvageable right foot and will need a right BKA and possible AKA. Today the patient is tearful about the surgery and her temp has risen to 99.1.  Plan is for surgery today 11/13. Continue Rocephin, Flagyl, Vancomycin, Analgesics.   HTN: Chronic. Moderate control. BP max 141/63. Continue home medication and monitor BP.   DM type 2: Chronic. CBG on 11/13 155. Lantus 12 units was added yesterday . Continue home medications for glucose management and diabetic neuropathy.   Anxiety: Continue home Xanex PRN.   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  She currently ambulates with a walking stick or walker.   Chronic diastolic heart failure grade 2: Stable: no signs of acute heart failure. Echo in 2014 showed LV EF 55/60% and a grade 2 diastolic dysfunction. Continue home medications.   PVD: Secondary to uncontrolled DM, HTN, and age. Angiography performed by Dr. Jacinto Halim 10/03/2014 which revealed diffuse superficial femoral and tibial occlusive disease. No pedal pulses palpable bilaterally. Continue home medications.   Hyperlipidemia: Chronic. Continue home medications.     DVT Prophylaxis:  Heparin   Code Status: Full Family Communication: None. Patient is alert and agreeable to  plan of care.  Disposition Plan: Remains inpatient. SNF when medically appropriate.    Consultants:  WOC   Vascular Surgery  Pharmacy  Procedures:  Right BKA/ AKA planned for 11/13  Antibiotics:  Rocephin 11/11>>  Flagyl 11/11>>  Vancomycin>>   Objective: Filed Vitals:   11/02/14 0630 11/02/14 1453 11/02/14 2134 11/03/14 0639  BP: 119/66 145/57 132/50 141/63  Pulse: 92 102 104 107  Temp: 98.9 F (37.2 C) 99 F (37.2 C) 99.2 F (37.3 C) 99.1 F (37.3 C)  TempSrc: Oral Oral Oral Oral  Resp: 16 17 18 16   Height:      Weight:      SpO2: 96% 97% 98% 97%    Intake/Output Summary (Last 24 hours) at 11/03/14 0833 Last data filed at 11/03/14 0056  Gross per 24 hour  Intake    912 ml  Output    300 ml  Net    612 ml   Filed Weights   11/01/14 1427 11/01/14 1644  Weight: 72.576 kg (160 lb) 72.576 kg (160 lb)    Exam: General: Patient is less tearful today, well developed, well nourished, appears stated age  HEENT:  EOMI, Anicteic Sclera, MMM.  Neck: Supple, no JVD, no masses  Cardiovascular: RRR, S1 S2 auscultated, no rubs, murmurs or gallops.   Respiratory: Clear to auscultation bilaterally with equal chest rise  Abdomen: Soft, nontender, nondistended, + bowel sounds  Extremities: Right foot is bloated with areas of necrotized tissue. Foul smelling odor. 2 cm foot ulcer present on the dorsal aspect of the ball of the foot.  Left foot is without erythema. Pedal pulses absent  bilaterally.  Psych: Patient understands and agrees to the plan of care but is tearful about the amputation scheduled for today.    Data Reviewed: Basic Metabolic Panel:  Recent Labs Lab 11/01/14 1539 11/01/14 2154 11/03/14 0420  NA 137  --  139  K 2.8*  --  3.1*  CL 100  --  102  CO2 21  --  23  GLUCOSE 299*  --  178*  BUN 22  --  15  CREATININE 0.93 0.83 0.91  CALCIUM 9.2  --  8.5   Liver Function Tests:  Recent Labs Lab 11/01/14 2154  ALBUMIN 2.0*   CBC:  Recent  Labs Lab 11/01/14 1539 11/01/14 2154 11/03/14 0420  WBC 21.4* 18.8* 20.3*  NEUTROABS 18.6* 15.8*  --   HGB 9.8* 9.2* 9.1*  HCT 29.7* 27.1* 26.3*  MCV 93.1 91.6 90.1  PLT 338 315 341   CBG:  Recent Labs Lab 11/02/14 0754 11/02/14 1155 11/02/14 1709 11/02/14 2139 11/03/14 0754  GLUCAP 246* 235* 255* 194* 155*    Recent Results (from the past 240 hour(s))  Culture, blood (routine x 2)     Status: None (Preliminary result)   Collection Time: 11/01/14  3:27 PM  Result Value Ref Range Status   Specimen Description BLOOD LEFT FOREARM  Final   Special Requests BOTTLES DRAWN AEROBIC AND ANAEROBIC 3ML  Final   Culture  Setup Time   Final    11/01/2014 21:25 Performed at [facility]    Culture   Final           BLOOD CULTURE RECEIVED NO GROWTH TO DATE CULTURE WILL BE HELD FOR 5 DAYS BEFORE ISSUING A FINAL NEGATIVE REPORT Performed at [facility]    Report Status PENDING  Incomplete  Culture, blood (routine x 2)     Status: None (Preliminary result)   Collection Time: 11/01/14  3:43 PM  Result Value Ref Range Status   Specimen Description BLOOD RIGHT FOREARM  Final   Special Requests BOTTLES DRAWN AEROBIC AND ANAEROBIC 5CC  Final   Culture  Setup Time   Final    11/01/2014 21:23 Performed at [facility]    Culture   Final    GRAM POSITIVE COCCI IN CLUSTERS Note: Gram Stain Report Called to,Read Back By and Verified With: CARLA PULIAO ON 11/02/2014 AT 8:38P BY WILEJ Performed at [facility]    Report Status PENDING  Incomplete  Blood Cultures x 2 sites     Status: None (Preliminary result)   Collection Time: 11/01/14  9:51 PM  Result Value Ref Range Status   Specimen Description BLOOD RIGHT HAND  Final   Special Requests BOTTLES DRAWN AEROBIC AND ANAEROBIC 3CC  Final   Culture  Setup Time   Final    11/02/2014 03:16 Performed at [facility]    Culture   Final           BLOOD CULTURE RECEIVED NO GROWTH TO DATE CULTURE WILL  BE HELD FOR 5 DAYS BEFORE ISSUING A FINAL NEGATIVE REPORT Performed at [facility]    Report Status PENDING  Incomplete  Blood Cultures x 2 sites     Status: None (Preliminary result)   Collection Time: 11/01/14  9:54 PM  Result Value Ref Range Status   Specimen Description BLOOD LEFT ARM  Final   Special Requests BOTTLES DRAWN AEROBIC ONLY 5CC  Final   Culture  Setup Time   Final    11/02/2014  03:16 Performed at [facility]   Final           BLOOD CULTURE RECEIVED NO GROWTH TO DATE CULTURE WILL BE HELD FOR 5 DAYS BEFORE ISSUING A FINAL NEGATIVE REPORT Performed at [facility]    Report Status PENDING  Incomplete  MRSA PCR Screening     Status: None   Collection Time: 11/02/14  6:06 PM  Result Value Ref Range Status   MRSA by PCR NEGATIVE NEGATIVE Final    Comment:        The GeneXpert MRSA Assay (FDA approved for NASAL specimens only), is one component of a comprehensive MRSA colonization surveillance program. It is not intended to diagnose MRSA infection nor to guide or monitor treatment for MRSA infections.      Studies: Dg Foot Complete Right  11-26-14   CLINICAL DATA:  Right foot pain, swelling. Gangrene and right third toe. Diabetic.  EXAM: RIGHT FOOT COMPLETE - 3+ VIEW  COMPARISON:  10/13/2014  FINDINGS: There is been destruction of the right third toe middle and distal phalanges. Lucency also noted in the distal aspect of the proximal third phalanx compatible with osteomyelitis. Extensive subcutaneous gas within the midfoot and forefoot.  IMPRESSION: Destruction of the right third toe middle and distal phalanges as well as the distal aspect of the proximal phalanx. Diffuse soft tissue gas.   Electronically Signed   By: Charlett Nose M.D.   On: 2014-11-26 16:21    Scheduled Meds: . aspirin EC  81 mg Oral Daily  . atorvastatin  10 mg Oral q1800  . cefTRIAXone (ROCEPHIN)  IV  2 g Intravenous Q24H   And  . metronidazole  500 mg  Intravenous Q8H  . cilostazol  100 mg Oral BID  . feeding supplement (GLUCERNA SHAKE)  237 mL Oral BID BM  . feeding supplement (PRO-STAT SUGAR FREE 64)  30 mL Oral TID BM  . gabapentin  300 mg Oral TID  . heparin  5,000 Units Subcutaneous 3 times per day  . hydrochlorothiazide  12.5 mg Oral Daily  . HYDROcodone-acetaminophen  1 tablet Oral BID  . insulin aspart  0-5 Units Subcutaneous QHS  . insulin aspart  0-9 Units Subcutaneous TID WC  . insulin glargine  15 Units Subcutaneous QHS  . lisinopril  10 mg Oral Daily  . modafinil  200 mg Oral BID  . potassium chloride  40 mEq Oral Q6H  . senna  1 tablet Oral BID  . vancomycin  750 mg Intravenous Q12H   Continuous Infusions:   Principal Problem:   Gangrene Active Problems:   Diabetes   Chronic diastolic heart failure, grade 2   Dyslipidemia   Wound, surgical, infected   Diabetic foot ulcer   Peripheral vascular disease    Eddie Candle PA-S [facility] Pager [phone]. If 7PM-7AM, please contact night-coverage at [email or web address], password [facility] 11/03/2014, 8:33 AM  LOS: 2 days     Addendum  I personally saw and evaluated patient on 11/03/2014 and agree with above findings. Patient appears toxic during my evaluation today, labs showing a white count of 20,300. Patient admitted for gangrene right foot which appeared worse on this mornings evaluation.She was taken to the OR where she underwent right below the knee amputation, procedure performed by Dr. Darrick Penna of vascular surgery, there were no immediate complications. Will continue empiric IV antimicrobial therapy with ceftriaxone, Flagyl, vancomycin. Blood cultures obtained on Nov 26, 2014 growing gram-positive cocci in clusters 1 out of  2 sets. Repeat blood cultures remain negative.await OR cultures.

## 2014-11-03 NOTE — Interval H&P Note (Signed)
History and Physical Interval Note:  11/03/2014 10:47 AM  Laurie [name]  has presented today for surgery, with the diagnosis of Gangrene right foot I96  The various methods of treatment have been discussed with the patient and family. After consideration of risks, benefits and other options for treatment, the patient has consented to  Procedure(s): AMPUTATION BELOW KNEE (Right) as a surgical intervention .  The patient's history has been reviewed, patient examined, no change in status, stable for surgery.  I have reviewed the patient's chart and labs.  Questions were answered to the patient's satisfaction.     FIELDS,CHARLES E

## 2014-11-03 NOTE — H&P (View-Only) (Signed)
Vascular Surgery Consultation  Reason for Consult: gangrene right foot  HPI: Laurie [name] is a 68 y.o. female who presents for evaluation of gangrene right foot. This patient with diabetes mellitus [facility] with progressive gangrene of the distal aspect of the right foot. She has been followed in the wound center by Dr. Ardath SaxPeter Parker. She apparently had her third toe amputated in the recent past-a few weeks ago although I'm unable to find an operative note for this procedure. She had angiography performed by Dr. Jacinto HalimGanji 10/03/2014 which revealed diffuse superficial femoral and tibial occlusive disease. 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 She denies chills and fever at home.   Past Medical History  Diagnosis Date  . Hypertension   . Diabetes mellitus   . Chest pain     Troponins up to 0.12 while hospitalized for E.coli PNA  . 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 Bacteremia   . Anemia   . Narcolepsy   . Stroke    Past Surgical History  Procedure Laterality Date  . No past surgeries     History   Social History  . Marital Status: Divorced    Spouse Name: N/A    Number of Children: N/A  . Years of Education: N/A   Social History Main Topics  . Smoking status: Current Every Day Smoker -- 0.25 packs/day for 39 years    Types: Cigarettes  . Smokeless tobacco: Never Used  . Alcohol Use: No  . Drug Use: No  . Sexual Activity: None   Other Topics Concern  . None   Social History Narrative   She lives with her sister and they do not have a good relationship.   Family History  Problem Relation Age of Onset  . CAD Mother   . CAD Father    Allergies  Allergen Reactions  . Penicillins Hives    Childhood allergy could have been more reaction only remembers hives.    Prior to Admission medications    Medication Sig Start Date End Date Taking? Authorizing Provider  ALPRAZolam (XANAX) 0.25 MG tablet Take 0.25 mg by mouth 2 (two) times daily as needed for anxiety.   Yes Historical Provider, MD  aspirin EC 81 MG tablet Take 81 mg by mouth daily.   Yes Historical Provider, MD  atorvastatin (LIPITOR) 10 MG tablet Take 1 tablet (10 mg total) by mouth daily at 6 PM. 11/04/13  Yes Evlyn KannerPamela S Love, PA-C  CHOLECALCIFEROL PO Take 1 tablet by mouth daily.   Yes Historical Provider, MD  clopidogrel (PLAVIX) 75 MG tablet Take 75 mg by mouth daily.   Yes Historical Provider, MD  co-enzyme Q-10 30 MG capsule Take 100 mg by mouth daily.    Yes Historical Provider, MD  doxycycline (VIBRAMYCIN) 100 MG capsule Take 100 mg by mouth 2 (two) times daily.   Yes Historical Provider, MD  gabapentin (NEURONTIN) 300 MG capsule Take 1 capsule (300 mg total) by mouth 2 (two) times daily. Patient taking differently: Take 300 mg by mouth 3 (three) times daily.  03/13/14  Yes Erick ColaceAndrew E Kirsteins, MD  HYDROcodone-acetaminophen (NORCO/VICODIN) 5-325 MG per tablet Take 1 tablet by mouth 2 (two) times daily.    Yes Historical Provider, MD  lisinopril-hydrochlorothiazide (PRINZIDE,ZESTORETIC) 20-25 MG per tablet Take 1 tablet by mouth daily.  Yes Historical Provider, MD  modafinil (PROVIGIL) 200 MG tablet Take 200 mg by mouth 2 (two) times daily.   Yes Historical Provider, MD  naproxen (NAPROSYN) 500 MG tablet Take 500 mg by mouth 2 (two) times daily with a meal.   Yes Historical Provider, MD  Omega-3 Fatty Acids (ULTRA OMEGA 3 PO) Take 1 capsule by mouth daily.   Yes Historical Provider, MD  traMADol-acetaminophen (ULTRACET) 37.5-325 MG per tablet Take 1 tablet by mouth every 6 (six) hours as needed. 10/03/14  Yes Jagadeesh R Ganji, MD  cilostazol (PLETAL) 100 MG tablet Take 1 tablet (100 mg total) by mouth 2 (two) times daily. 10/03/14   Jagadeesh R Ganji, MD  lisinopril-hydrochlorothiazide (PRINZIDE,ZESTORETIC) 10-12.5 MG per  tablet Take 1 tablet by mouth daily.    Historical Provider, MD  metFORMIN (GLUCOPHAGE) 500 MG tablet Take 1 tablet (500 mg total) by mouth 2 (two) times daily with a meal. For diabetes 10/04/14   Jagadeesh R Ganji, MD     Positive ROS: denies chest pain, dyspnea on exertion, PND, orthopnea, hemoptysis. Does have history of right brain CVA as noted above.  All other systems have been reviewed and were otherwise negative with the exception of those mentioned in the HPI and as above.  Physical Exam: Filed Vitals:   11/02/14 0630  BP: 119/66  Pulse: 92  Temp: 98.9 F (37.2 C)  Resp: 16    General: Alert, no acute distress HEENT: Normal for age Cardiovascular: Regular rate and rhythm. Carotid pulses 2+, no bruits audible Respiratory: Clear to auscultation. No cyanosis, no use of accessory musculature GI: No organomegaly, abdomen is soft and non-tender Skin: No lesions in the area of chief complaint Neurologic: Sensation intact distally Psychiatric: Patient is competent for consent with normal mood and affect Musculoskeletal: No obvious deformities Extremities: right lower extremity with 3+ femoral absent popliteal and absent distal pulses. There is extensive gangrene with full-thickness necrosis on the dorsal aspect of the right foot the base of the second and third toes extending back about 6 cm. There is also evidence of deep foot infection with blistering of the skin on the plantar surface medially. No proximal erythema noted. No crepitance noted. Left leg with 3+ femoral absent popliteal and distal pulses and no evidence of ischemia gangrene or ulceration    Imaging reviewed: angiograms from 10/03/2014 were reviewed which reveal right superficial femoral artery to be patent but diffusely diseased with occlusion of the below-knee popliteal artery and reconstitution of posterior tibial and anterior tibial arteries distally but all vessels are diseased   Assessment/Plan:  Patient  has nonsalvageable right foot with severe diffuse superficial femoral popliteal and tibial occlusive disease She needs either right below knee or above-knee amputation. She may heal a below-knee amputation. It is her dominant lower extremity and she has had a stroke involving the left side  Will try right BKA with possible AKA in a.m. Discussed this with patient and she agrees and is ready to proceed.   James Lawson, MD 11/02/2014 9:23 AM      

## 2014-11-04 LAB — CBC
HEMATOCRIT: 28.9 % — AB (ref 36.0–46.0)
Hemoglobin: 9.7 g/dL — ABNORMAL LOW (ref 12.0–15.0)
MCH: 30.8 pg (ref 26.0–34.0)
MCHC: 33.6 g/dL (ref 30.0–36.0)
MCV: 91.7 fL (ref 78.0–100.0)
Platelets: 370 10*3/uL (ref 150–400)
RBC: 3.15 MIL/uL — ABNORMAL LOW (ref 3.87–5.11)
RDW: 13.2 % (ref 11.5–15.5)
WBC: 20.8 10*3/uL — ABNORMAL HIGH (ref 4.0–10.5)

## 2014-11-04 LAB — GLUCOSE, CAPILLARY
GLUCOSE-CAPILLARY: 285 mg/dL — AB (ref 70–99)
Glucose-Capillary: 314 mg/dL — ABNORMAL HIGH (ref 70–99)
Glucose-Capillary: 334 mg/dL — ABNORMAL HIGH (ref 70–99)
Glucose-Capillary: 258 mg/dL — ABNORMAL HIGH (ref 70–99)

## 2014-11-04 LAB — CULTURE, BLOOD (ROUTINE X 2)

## 2014-11-04 LAB — BASIC METABOLIC PANEL
Anion gap: 14 (ref 5–15)
BUN: 27 mg/dL — ABNORMAL HIGH (ref 6–23)
CO2: 22 mEq/L (ref 19–32)
Calcium: 8.2 mg/dL — ABNORMAL LOW (ref 8.4–10.5)
Chloride: 101 mEq/L (ref 96–112)
Creatinine, Ser: 0.95 mg/dL (ref 0.50–1.10)
GFR calc Af Amer: 71 mL/min — ABNORMAL LOW (ref >90)
GFR, EST NON AFRICAN AMERICAN: 62 mL/min — AB (ref >90)
GLUCOSE: 322 mg/dL — AB (ref 70–99)
POTASSIUM: 4.6 meq/L (ref 3.7–5.3)
Sodium: 137 mEq/L (ref 137–147)

## 2014-11-04 MED ORDER — INSULIN ASPART 100 UNIT/ML ~~LOC~~ SOLN
0.0000 [IU] | Freq: Three times a day (TID) | SUBCUTANEOUS | Status: DC
  Administered 2014-11-04: 11 [IU] via SUBCUTANEOUS
  Administered 2014-11-05 (×2): 3 [IU] via SUBCUTANEOUS
  Administered 2014-11-05 – 2014-11-06 (×2): 4 [IU] via SUBCUTANEOUS
  Administered 2014-11-06 – 2014-11-07 (×2): 3 [IU] via SUBCUTANEOUS

## 2014-11-04 MED ORDER — INSULIN ASPART 100 UNIT/ML ~~LOC~~ SOLN
0.0000 [IU] | Freq: Every day | SUBCUTANEOUS | Status: DC
  Administered 2014-11-04: 3 [IU] via SUBCUTANEOUS
  Administered 2014-11-05: 2 [IU] via SUBCUTANEOUS

## 2014-11-04 MED ORDER — INSULIN GLARGINE 100 UNIT/ML ~~LOC~~ SOLN
20.0000 [IU] | Freq: Every day | SUBCUTANEOUS | Status: DC
  Administered 2014-11-04 – 2014-11-06 (×3): 20 [IU] via SUBCUTANEOUS
  Filled 2014-11-04 (×4): qty 0.2

## 2014-11-04 MED ORDER — LISINOPRIL 20 MG PO TABS
20.0000 mg | ORAL_TABLET | Freq: Every day | ORAL | Status: DC
  Administered 2014-11-05 – 2014-11-07 (×3): 20 mg via ORAL
  Filled 2014-11-04: qty 2
  Filled 2014-11-04 (×3): qty 1

## 2014-11-04 NOTE — Progress Notes (Signed)
[facility]  Subjective  - Pain right leg   Objective 158/72 76 97.4 F (36.3 C) (Oral) 20 98%  Intake/Output Summary (Last 24 hours) at 11/04/14 0859 Last data filed at 11/04/14 0448  Gross per 24 hour  Intake   2869 ml  Output    850 ml  Net   2019 ml   Right BKA dressing dry no obvious hematoma  Assessment/Planning: Will change dressing tomorrow No pillows under leg as this encourages flexion contracture Hypokalemia per primary team  Laurie [name] 11/04/2014 8:59 AM --  Laboratory Lab Results:  Recent Labs  11/01/14 2154 11/03/14 0420  WBC 18.8* 20.3*  HGB 9.2* 9.1*  HCT 27.1* 26.3*  PLT 315 341   BMET  Recent Labs  11/01/14 1539 11/01/14 2154 11/03/14 0420  NA 137  --  139  K 2.8*  --  3.1*  CL 100  --  102  CO2 21  --  23  GLUCOSE 299*  --  178*  BUN 22  --  15  CREATININE 0.93 0.83 0.91  CALCIUM 9.2  --  8.5    COAG Lab Results  Component Value Date   INR 1.03 10/03/2014   INR 0.99 10/24/2013   INR 1.2 04/27/2009   No results found for: PTT

## 2014-11-04 NOTE — Progress Notes (Signed)
ANTIBIOTIC CONSULT NOTE - FOLLOW UP  Pharmacy Consult for Vanco/Flagyl/Rocephin Indication: Sepsis, foot infection  Allergies  Allergen Reactions  . Penicillins Hives    Childhood allergy could have been more reaction only remembers hives.     Patient Measurements: Height: 5\' 7"  (170.2 cm) Weight: 160 lb (72.576 kg) IBW/kg (Calculated) : 61.6 Adjusted Body Weight:   Vital Signs: Temp: 97.4 F (36.3 C) (11/14 0534) Temp Source: Oral (11/14 0534) BP: 165/73 mmHg (11/14 0954) Pulse Rate: 76 (11/14 0534) Intake/Output from previous day: 11/13 0701 - 11/14 0700 In: 2869 [P.O.:580; I.V.:2039; IV Piggyback:250] Out: 850 [Urine:750; Blood:100] Intake/Output from this shift: Total I/O In: 459 [P.O.:459] Out: 250 [Urine:250]  Labs:  Recent Labs  11/01/14 2154 11/03/14 0420 11/04/14 0820  WBC 18.8* 20.3* 20.8*  HGB 9.2* 9.1* 9.7*  PLT 315 341 370  CREATININE 0.83 0.91 0.95   Estimated Creatinine Clearance: 57.4 mL/min (by C-G formula based on Cr of 0.95). No results for input(s): VANCOTROUGH, VANCOPEAK, VANCORANDOM, GENTTROUGH, GENTPEAK, GENTRANDOM, TOBRATROUGH, TOBRAPEAK, TOBRARND, AMIKACINPEAK, AMIKACINTROU, AMIKACIN in the last 72 hours.   Microbiology:   Anti-infectives    Start     Dose/Rate Route Frequency Ordered Stop   11/02/14 0200  vancomycin (VANCOCIN) IVPB 750 mg/150 ml premix     750 mg150 mL/hr over 60 Minutes Intravenous Every 12 hours 11/01/14 2055     11/01/14 2031  cefTRIAXone (ROCEPHIN) 2 g in dextrose 5 % 50 mL IVPB - Premix     2 g100 mL/hr over 30 Minutes Intravenous Every 24 hours 11/01/14 2031     11/01/14 2031  metroNIDAZOLE (FLAGYL) IVPB 500 mg     500 mg100 mL/hr over 60 Minutes Intravenous Every 8 hours 11/01/14 2031     11/01/14 1630  ceFEPIme (MAXIPIME) 2 g in dextrose 5 % 50 mL IVPB     2 g100 mL/hr over 30 Minutes Intravenous STAT 11/01/14 1619 11/01/14 1802   11/01/14 1615  vancomycin (VANCOCIN) 1,250 mg in sodium chloride 0.9 % 250 mL  IVPB  Status:  Discontinued     1,250 mg166.7 mL/hr over 90 Minutes Intravenous STAT 11/01/14 1614 11/01/14 2031   11/01/14 1600  vancomycin (VANCOCIN) IVPB 1000 mg/200 mL premix  Status:  Discontinued     1,000 mg200 mL/hr over 60 Minutes Intravenous  Once 11/01/14 1556 11/01/14 1614   11/01/14 1545  vancomycin (VANCOCIN) 1,250 mg in sodium chloride 0.9 % 250 mL IVPB  Status:  Discontinued     1,250 mg166.7 mL/hr over 90 Minutes Intravenous STAT 11/01/14 1538 11/01/14 1840      Assessment: 68 yo AA [facility] with sepsis, R foot infection, and recent amputation of R 3rd toe.Taking doxycyline as an outpatient. [facility] who has recommended BKA.  PMH: Severe PVD, HTN, stroke, DM, sacral decubitus ulcer, narcolepsy, chest pain, current smoker  Anticoagulation: None PTA, Hep SQ for VTE px. Hgb 9.7 but stable.  Infectious Disease: Vanc/Ceftriaxone D#4 for R foot infection. Afebrile, WBC 20.8. S/p BKA 11/13. F/u abx LOT now that source of infxn removed - usually only 48-72 hrs. F/u BC x 4.  PTA Doxy 11/11 Vancomycin >> 11/11 Cefepime x1  11/11 Ceftriaxone>> 11/11 Flagyl >>  11/11 Bld x4 >> CNS x1 11/12 MRSA PCR negative  Cardiovascular: Hx HTN, PVD. VSS. On ASA81, Lipitor, pletal, HCTZ, lisinopril  Endocrinology: Hx DM. CBGs 144-314 and increasing On lantus 15 units hs, sensitive SSI ac/hs  Gastrointestinal / Nutrition:  CHO mod diet.  Neurology: Hx CVA,anxiety. On ASA for secondary CVA px, gabapentin, provigil  Nephrology: SCr stable, K 3.1>>4.6 replaced  Pulmonary: RA, +tobacco  Hematology / Oncology: Hgb/Hct stable, plts wnl.  PTA Medication Issues: not resumed: plavix, metformin, omega-3  Best Practices: SQ hep   Goal of Therapy:  Vancomycin trough level 15-20 mcg/ml for sepsis  Plan:  Vancomycin 750mg  IV q12h. No levels. Hope to d/c soon. Flagyl 500mg  IV q8hr Rocephin 2g IV q12h   Erinne Gillentine S. Merilynn Finlandobertson, PharmD,  [facility] Clinical Staff Pharmacist Pager [phone]  Misty Stanleyobertson, Kelseigh Diver Stillinger 11/04/2014,11:19 AM

## 2014-11-04 NOTE — Plan of Care (Signed)
Problem: Phase I Progression Outcomes Goal: Pain controlled with appropriate interventions Outcome: Completed/Met Date Met:  11/04/14 Goal: Incision/dressings dry and intact Outcome: Completed/Met Date Met:  11/04/14 Goal: Voiding-avoid urinary catheter unless indicated Outcome: Completed/Met Date Met:  11/04/14 Goal: Vital signs/hemodynamically stable Outcome: Completed/Met Date Met:  11/04/14  Problem: Phase II Progression Outcomes Goal: Pain controlled Outcome: Completed/Met Date Met:  11/04/14 Goal: Vital signs stable Outcome: Completed/Met Date Met:  11/04/14 Goal: Dressings dry/intact Outcome: Completed/Met Date Met:  11/04/14 Goal: Tolerating diet Outcome: Completed/Met Date Met:  11/04/14

## 2014-11-04 NOTE — Progress Notes (Addendum)
Laurie [name]:[id number] DOB: October 13, 1946 DOA: 11/01/2014 PCP: Lonia Blood, MD  HPI/Subjective: Patient is a 68 year old AA female with PHM significant for DM, anxiety, HTN, PVD, and nicotine dependence is admitted roughly one week after Right 3rd digit amputation for surgical site infection. Patient was found to have gangrene.     Assessment/Plan: Surgical would infection/Gangrene: Likely secondary PVD and DM2. Seen by WOC who noted that the patent has gangrene. Plain film of right foot shows destruction of the right third toe middle and distal phalanges as well as distal aspect of proximal phalanx with soft tissue gas.  On 11/03/2014 she was taken to the OR undergoing right below the knee amputation, procedure performed by Dr. Darrick Penna. She tolerated procedure well there were no immediate complications. She required was negative streptococcus 1 out of 2 from cultures on 11/01/2014. Will simplify antimicrobial regimen, discontinue vancomycin and Flagyl, continue ceftriaxone for now  HTN: blood pressures in the 140s to 160s, will increase lisinopril to 20 mg by mouth daily, continue hydrochlorothiazide 12.5 mg by mouth daily  DM type 2: patient's blood sugars increasing to the 300 range today, Will increase Lantus to 20 units subcutaneous daily, meanwhile continue Accu-Cheks before every meal and daily at bedtime with sliding scale coverage.  Anxiety: Continue home Xanex PRN.   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  She currently ambulates with a walking stick or walker.   Chronic diastolic heart failure grade 2: Stable: no signs of acute heart failure. Echo in 2014 showed LV EF 55/60% and a grade 2 diastolic dysfunction. Continue home medications.   PVD: Secondary to uncontrolled DM, HTN, and age. Angiography performed by Dr. Jacinto Halim 10/03/2014 which revealed diffuse superficial femoral and tibial occlusive disease. No pedal pulses  palpable bilaterally. Continue home medications.   Hyperlipidemia: Chronic. Continue home medications.     DVT Prophylaxis:  Heparin   Code Status: Full Family Communication: None. Patient is alert and agreeable to plan of care.  Disposition Plan: Remains inpatient. SNF when medically appropriate.    Consultants:  WOC   Vascular Surgery  Pharmacy  Procedures:  Right BKA/ AKA planned for 11/13  Antibiotics:  Rocephin 11/11>>  Flagyl 11/11>>11/04/2014  Vancomycin>> 11/04/2014  Objective: Filed Vitals:   11/04/14 0446 11/04/14 0534 11/04/14 0954 11/04/14 1425  BP: 162/76 158/72 165/73 168/88  Pulse:  76  93  Temp:  97.4 F (36.3 C)  98.3 F (36.8 C)  TempSrc:  Oral  Oral  Resp:  20  20  Height:      Weight:      SpO2:  98%  100%    Intake/Output Summary (Last 24 hours) at 11/04/14 1611 Last data filed at 11/04/14 1047  Gross per 24 hour  Intake   1978 ml  Output   1000 ml  Net    978 ml   Filed Weights   11/01/14 1427 11/01/14 1644  Weight: 72.576 kg (160 lb) 72.576 kg (160 lb)    Exam: General: Patient is less tearful today, well developed, well nourished, appears stated age  HEENT:  EOMI, Anicteic Sclera, MMM.  Neck: Supple, no JVD, no masses  Cardiovascular: RRR, S1 S2 auscultated, no rubs, murmurs or gallops.   Respiratory: Clear to auscultation bilaterally with equal chest rise  Abdomen: Soft, nontender, nondistended, + bowel sounds  Extremities: status post right below the knee amputation Psych: patient is awake alert oriented   Data Reviewed:  Basic Metabolic Panel:  Recent Labs Lab 11/01/14 1539 11/01/14 2154 11/03/14 0420 11/04/14 0820  NA 137  --  139 137  K 2.8*  --  3.1* 4.6  CL 100  --  102 101  CO2 21  --  23 22  GLUCOSE 299*  --  178* 322*  BUN 22  --  15 27*  CREATININE 0.93 0.83 0.91 0.95  CALCIUM 9.2  --  8.5 8.2*   Liver Function Tests:  Recent Labs Lab 11/01/14 2154  ALBUMIN 2.0*   CBC:  Recent Labs Lab  11/01/14 1539 11/01/14 2154 11/03/14 0420 11/04/14 0820  WBC 21.4* 18.8* 20.3* 20.8*  NEUTROABS 18.6* 15.8*  --   --   HGB 9.8* 9.2* 9.1* 9.7*  HCT 29.7* 27.1* 26.3* 28.9*  MCV 93.1 91.6 90.1 91.7  PLT 338 315 341 370   CBG:  Recent Labs Lab 11/03/14 1435 11/03/14 1647 11/03/14 2138 11/04/14 0752 11/04/14 1147  GLUCAP 158* 189* 298* 314* 334*    Recent Results (from the past 240 hour(s))  Culture, blood (routine x 2)     Status: None (Preliminary result)   Collection Time: 11/01/14  3:27 PM  Result Value Ref Range Status   Specimen Description BLOOD LEFT FOREARM  Final   Special Requests BOTTLES DRAWN AEROBIC AND ANAEROBIC 3ML  Final   Culture  Setup Time   Final    11/01/2014 21:25 Performed at [facility]    Culture   Final           BLOOD CULTURE RECEIVED NO GROWTH TO DATE CULTURE WILL BE HELD FOR 5 DAYS BEFORE ISSUING A FINAL NEGATIVE REPORT Performed at [facility]    Report Status PENDING  Incomplete  Culture, blood (routine x 2)     Status: None   Collection Time: 11/01/14  3:43 PM  Result Value Ref Range Status   Specimen Description BLOOD RIGHT FOREARM  Final   Special Requests BOTTLES DRAWN AEROBIC AND ANAEROBIC 5CC  Final   Culture  Setup Time   Final    11/01/2014 21:23 Performed at [facility]    Culture   Final    STAPHYLOCOCCUS SPECIES (COAGULASE NEGATIVE) Note: THE SIGNIFICANCE OF ISOLATING THIS ORGANISM FROM A SINGLE SET OF BLOOD CULTURES WHEN MULTIPLE SETS ARE DRAWN IS UNCERTAIN. PLEASE NOTIFY THE MICROBIOLOGY DEPARTMENT WITHIN ONE WEEK IF SPECIATION AND SENSITIVITIES ARE REQUIRED. Note: Gram Stain Report Called to,Read Back By and Verified With: CARLA PULIAO ON 11/02/2014 AT 8:38P BY WILEJ Performed at [facility]    Report Status 11/04/2014 FINAL  Final  Blood Cultures x 2 sites     Status: None (Preliminary result)   Collection Time: 11/01/14  9:51 PM  Result Value Ref Range Status   Specimen  Description BLOOD RIGHT HAND  Final   Special Requests BOTTLES DRAWN AEROBIC AND ANAEROBIC 3CC  Final   Culture  Setup Time   Final    11/02/2014 03:16 Performed at [facility]    Culture   Final           BLOOD CULTURE RECEIVED NO GROWTH TO DATE CULTURE WILL BE HELD FOR 5 DAYS BEFORE ISSUING A FINAL NEGATIVE REPORT Performed at [facility]    Report Status PENDING  Incomplete  Blood Cultures x 2 sites     Status: None (Preliminary result)   Collection Time: 11/01/14  9:54 PM  Result Value Ref Range Status   Specimen Description BLOOD LEFT  ARM  Final   Special Requests BOTTLES DRAWN AEROBIC ONLY 5CC  Final   Culture  Setup Time   Final    11/02/2014 03:16 Performed at [facility]    Culture   Final           BLOOD CULTURE RECEIVED NO GROWTH TO DATE CULTURE WILL BE HELD FOR 5 DAYS BEFORE ISSUING A FINAL NEGATIVE REPORT Performed at [facility]    Report Status PENDING  Incomplete  MRSA PCR Screening     Status: None   Collection Time: 11/02/14  6:06 PM  Result Value Ref Range Status   MRSA by PCR NEGATIVE NEGATIVE Final    Comment:        The GeneXpert MRSA Assay (FDA approved for NASAL specimens only), is one component of a comprehensive MRSA colonization surveillance program. It is not intended to diagnose MRSA infection nor to guide or monitor treatment for MRSA infections.      Studies: Dg Foot Complete Right  11/01/2014   CLINICAL DATA:  Right foot pain, swelling. Gangrene and right third toe. Diabetic.  EXAM: RIGHT FOOT COMPLETE - 3+ VIEW  COMPARISON:  10/13/2014  FINDINGS: There is been destruction of the right third toe middle and distal phalanges. Lucency also noted in the distal aspect of the proximal third phalanx compatible with osteomyelitis. Extensive subcutaneous gas within the midfoot and forefoot.  IMPRESSION: Destruction of the right third toe middle and distal phalanges as well as the distal aspect of the proximal  phalanx. Diffuse soft tissue gas.   Electronically Signed   By: Charlett NoseKevin  Dover M.D.   On: 11/01/2014 16:21    Scheduled Meds: . aspirin EC  81 mg Oral Daily  . atorvastatin  10 mg Oral q1800  . cefTRIAXone (ROCEPHIN)  IV  2 g Intravenous Q24H   And  . metronidazole  500 mg Intravenous Q8H  . cilostazol  100 mg Oral BID  . feeding supplement (GLUCERNA SHAKE)  237 mL Oral BID BM  . feeding supplement (PRO-STAT SUGAR FREE 64)  30 mL Oral TID BM  . gabapentin  300 mg Oral TID  . heparin  5,000 Units Subcutaneous 3 times per day  . hydrochlorothiazide  12.5 mg Oral Daily  . HYDROcodone-acetaminophen  1 tablet Oral BID  . insulin aspart  0-5 Units Subcutaneous QHS  . insulin aspart  0-9 Units Subcutaneous TID WC  . insulin glargine  20 Units Subcutaneous QHS  . lisinopril  10 mg Oral Daily  . modafinil  200 mg Oral BID  . senna  1 tablet Oral BID  . vancomycin  750 mg Intravenous Q12H   Continuous Infusions: . lactated ringers 50 mL/hr at 11/04/14 0148    Principal Problem:   Gangrene Active Problems:   Peripheral vascular disease   Diabetes   Chronic diastolic heart failure, grade 2   Dyslipidemia   Wound, surgical, infected   Diabetic foot ulcer

## 2014-11-05 LAB — GLUCOSE, CAPILLARY
GLUCOSE-CAPILLARY: 137 mg/dL — AB (ref 70–99)
Glucose-Capillary: 141 mg/dL — ABNORMAL HIGH (ref 70–99)
Glucose-Capillary: 191 mg/dL — ABNORMAL HIGH (ref 70–99)
Glucose-Capillary: 219 mg/dL — ABNORMAL HIGH (ref 70–99)

## 2014-11-05 MED ORDER — LACTULOSE 10 GM/15ML PO SOLN
20.0000 g | Freq: Once | ORAL | Status: DC
  Filled 2014-11-05 (×2): qty 30

## 2014-11-05 MED ORDER — CEPHALEXIN 500 MG PO CAPS
500.0000 mg | ORAL_CAPSULE | Freq: Three times a day (TID) | ORAL | Status: DC
  Administered 2014-11-05 – 2014-11-07 (×7): 500 mg via ORAL
  Filled 2014-11-05 (×10): qty 1

## 2014-11-05 MED ORDER — POLYETHYLENE GLYCOL 3350 17 G PO PACK
17.0000 g | PACK | Freq: Every day | ORAL | Status: DC
  Administered 2014-11-05: 17 g via ORAL
  Filled 2014-11-05 (×2): qty 1

## 2014-11-05 MED ORDER — CLOPIDOGREL BISULFATE 75 MG PO TABS
75.0000 mg | ORAL_TABLET | Freq: Every day | ORAL | Status: DC
  Administered 2014-11-05 – 2014-11-07 (×3): 75 mg via ORAL
  Filled 2014-11-05 (×4): qty 1

## 2014-11-05 NOTE — Progress Notes (Signed)
[facility]  Subjective  - Still some pain   Objective 146/64 96 98.5 F (36.9 C) (Oral) 19 98%  Intake/Output Summary (Last 24 hours) at 11/05/14 0900 Last data filed at 11/05/14 0320  Gross per 24 hour  Intake   1299 ml  Output    250 ml  Net   1049 ml   Right BKA looks good, healing incision  Assessment/Planning: Ok for d/c from our standpoint Needs follow up in one month for staple removal Wound care Kerlix ace wrap BKA once daily  Jasie Meleski E 11/05/2014 9:00 AM --  Laboratory Lab Results:  Recent Labs  11/03/14 0420 11/04/14 0820  WBC 20.3* 20.8*  HGB 9.1* 9.7*  HCT 26.3* 28.9*  PLT 341 370   BMET  Recent Labs  11/03/14 0420 11/04/14 0820  NA 139 137  K 3.1* 4.6  CL 102 101  CO2 23 22  GLUCOSE 178* 322*  BUN 15 27*  CREATININE 0.91 0.95  CALCIUM 8.5 8.2*    COAG Lab Results  Component Value Date   INR 1.03 10/03/2014   INR 0.99 10/24/2013   INR 1.2 04/27/2009   No results found for: PTT

## 2014-11-05 NOTE — Progress Notes (Addendum)
PROGRESS NOTE  Laurie [name] JYN:[id number] DOB: [birth year] DOA: 11/01/2014 PCP: Lonia BloodGARBA,LAWAL, MD  HPI/Subjective: Patient is a 68 year old AA female with PHM significant for DM, anxiety, HTN, PVD, and nicotine dependence is admitted roughly one week after Right 3rd digit amputation for surgical site infection. Patient was found to have gangrene.  Patient's on 11/05/2014 remains stable, she was assisted out of bed to chair. Remains afebrile, hemodynamically stable  Assessment/Plan: Surgical would infection/Gangrene: Likely secondary PVD and DM2. Seen by WOC who noted that the patent has gangrene. Plain film of right foot shows destruction of the right third toe middle and distal phalanges as well as distal aspect of proximal phalanx with soft tissue gas.  On 11/03/2014 she was taken to the OR undergoing right below the knee amputation, procedure performed by Dr. Darrick PennaFields. She tolerated procedure well there were no immediate complications. Surgical incision site appears clean Blood cultures obtained on 11/01/2014 growing 1/2 coagulase negative staph, repeat blood cultures no growth to date Will discontinue ceftriaxone, start oral Keflex.   HTN: Her lisinopril was increased to 20 mg by mouth daily on 11/04/2014, systolic blood pressures in the 130s to 140s today.  DM type 2: patient's blood sugars increasing to the 300 range today, Will increase Lantus to 20 units subcutaneous daily, meanwhile continue Accu-Cheks before every meal and daily at bedtime with sliding scale coverage.  Anxiety: Continue home Xanex PRN.   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  She currently ambulates with a walking stick or walker.   Chronic diastolic heart failure grade 2: Stable: no signs of acute heart failure. Echo in 2014 showed LV EF 55/60% and a grade 2 diastolic dysfunction. Continue home medications.   PVD: Secondary to uncontrolled DM, HTN, and age. Angiography  performed by Dr. Jacinto HalimGanji 10/03/2014 which revealed diffuse superficial femoral and tibial occlusive disease. No pedal pulses palpable bilaterally. Continue home medications.   Hyperlipidemia: Chronic. Continue home medications.     DVT Prophylaxis:  Heparin   Code Status: Full Family Communication: None. Patient is alert and agreeable to plan of care.  Disposition Plan: Remains inpatient. SNF when medically appropriate.    Consultants:  WOC   Vascular Surgery  Pharmacy  Procedures:  Right BKA/ AKA planned for 11/13  Antibiotics:  Rocephin 11/11>>11/05/2014  Flagyl 11/11>>11/04/2014  Vancomycin>> 11/04/2014  Keflex 11/15>>  Objective: Filed Vitals:   11/04/14 1425 11/04/14 2217 11/05/14 0126 11/05/14 0630  BP: 168/88 181/77 137/69 146/64  Pulse: 93 102 96 96  Temp: 98.3 F (36.8 C) 98.6 F (37 C)  98.5 F (36.9 C)  TempSrc: Oral Oral  Oral  Resp: 20 20  19   Height:      Weight:      SpO2: 100% 99% 100% 98%    Intake/Output Summary (Last 24 hours) at 11/05/14 1303 Last data filed at 11/05/14 0911  Gross per 24 hour  Intake   1140 ml  Output      0 ml  Net   1140 ml   Filed Weights   11/01/14 1427 11/01/14 1644  Weight: 72.576 kg (160 lb) 72.576 kg (160 lb)    Exam: General: patient becomes tearful at times reporting pain HEENT:  EOMI, Anicteic Sclera, MMM.  Neck: Supple, no JVD, no masses  Cardiovascular: RRR, S1 S2 auscultated, no rubs, murmurs or gallops.   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 Psych: patient is awake and alert, becomes tearful at times   Data Reviewed: Basic Metabolic Panel:  Recent Labs Lab 11/01/14 1539 11/01/14 2154 11/03/14 0420 11/04/14 0820  NA 137  --  139 137  K 2.8*  --  3.1* 4.6  CL 100  --  102 101  CO2 21  --  23 22  GLUCOSE 299*  --   178* 322*  BUN 22  --  15 27*  CREATININE 0.93 0.83 0.91 0.95  CALCIUM 9.2  --  8.5 8.2*   Liver Function Tests:  Recent Labs Lab 11/01/14 2154  ALBUMIN 2.0*   CBC:  Recent Labs Lab 11/01/14 1539 11/01/14 2154 11/03/14 0420 11/04/14 0820  WBC 21.4* 18.8* 20.3* 20.8*  NEUTROABS 18.6* 15.8*  --   --   HGB 9.8* 9.2* 9.1* 9.7*  HCT 29.7* 27.1* 26.3* 28.9*  MCV 93.1 91.6 90.1 91.7  PLT 338 315 341 370   CBG:  Recent Labs Lab 11/04/14 1147 11/04/14 1652 11/04/14 2204 11/05/14 0810 11/05/14 1211  GLUCAP 334* 285* 258* 141* 137*    Recent Results (from the past 240 hour(s))  Culture, blood (routine x 2)     Status: None (Preliminary result)   Collection Time: 11/01/14  3:27 PM  Result Value Ref Range Status   Specimen Description BLOOD LEFT FOREARM  Final   Special Requests BOTTLES DRAWN AEROBIC AND ANAEROBIC 3ML  Final   Culture  Setup Time   Final    11/01/2014 21:25 Performed at [facility]    Culture   Final           BLOOD CULTURE RECEIVED NO GROWTH TO DATE CULTURE WILL BE HELD FOR 5 DAYS BEFORE ISSUING A FINAL NEGATIVE REPORT Performed at [facility]    Report Status PENDING  Incomplete  Culture, blood (routine x 2)     Status: None   Collection Time: 11/01/14  3:43 PM  Result Value Ref Range Status   Specimen Description BLOOD RIGHT FOREARM  Final   Special Requests BOTTLES DRAWN AEROBIC AND ANAEROBIC 5CC  Final   Culture  Setup Time   Final    11/01/2014 21:23 Performed at [facility]    Culture   Final    STAPHYLOCOCCUS SPECIES (COAGULASE NEGATIVE) Note: THE SIGNIFICANCE OF ISOLATING THIS ORGANISM FROM A SINGLE SET OF BLOOD CULTURES WHEN MULTIPLE SETS ARE DRAWN IS UNCERTAIN. PLEASE NOTIFY THE MICROBIOLOGY DEPARTMENT WITHIN ONE WEEK IF SPECIATION AND SENSITIVITIES ARE REQUIRED. Note: Gram Stain Report Called to,Read Back By and Verified With: CARLA PULIAO ON 11/02/2014 AT 8:38P BY WILEJ Performed at [facility]      Report Status 11/04/2014 FINAL  Final  Blood Cultures x 2 sites     Status: None (Preliminary result)   Collection Time: 11/01/14  9:51 PM  Result Value Ref Range Status   Specimen Description BLOOD RIGHT HAND  Final   Special Requests BOTTLES DRAWN AEROBIC AND ANAEROBIC 3CC  Final   Culture  Setup Time   Final    11/02/2014 03:16 Performed at [facility]    Culture   Final           BLOOD CULTURE RECEIVED NO GROWTH TO DATE CULTURE WILL BE HELD FOR 5 DAYS BEFORE ISSUING A FINAL NEGATIVE REPORT Performed at [facility]    Report Status PENDING  Incomplete  Blood Cultures x 2 sites  Status: None (Preliminary result)   Collection Time: 2014/11/24  9:54 PM  Result Value Ref Range Status   Specimen Description BLOOD LEFT ARM  Final   Special Requests BOTTLES DRAWN AEROBIC ONLY 5CC  Final   Culture  Setup Time   Final    11/02/2014 03:16 Performed at [facility]    Culture   Final           BLOOD CULTURE RECEIVED NO GROWTH TO DATE CULTURE WILL BE HELD FOR 5 DAYS BEFORE ISSUING A FINAL NEGATIVE REPORT Performed at [facility]    Report Status PENDING  Incomplete  MRSA PCR Screening     Status: None   Collection Time: 11/02/14  6:06 PM  Result Value Ref Range Status   MRSA by PCR NEGATIVE NEGATIVE Final    Comment:        The GeneXpert MRSA Assay (FDA approved for NASAL specimens only), is one component of a comprehensive MRSA colonization surveillance program. It is not intended to diagnose MRSA infection nor to guide or monitor treatment for MRSA infections.      Studies: Dg Foot Complete Right  11-24-2014   CLINICAL DATA:  Right foot pain, swelling. Gangrene and right third toe. Diabetic.  EXAM: RIGHT FOOT COMPLETE - 3+ VIEW  COMPARISON:  10/13/2014  FINDINGS: There is been destruction of the right third toe middle and distal phalanges. Lucency also noted in the distal aspect of the proximal third phalanx compatible with  osteomyelitis. Extensive subcutaneous gas within the midfoot and forefoot.  IMPRESSION: Destruction of the right third toe middle and distal phalanges as well as the distal aspect of the proximal phalanx. Diffuse soft tissue gas.   Electronically Signed   By: Charlett Nose M.D.   On: 2014/11/24 16:21    Scheduled Meds: . aspirin EC  81 mg Oral Daily  . atorvastatin  10 mg Oral q1800  . cefTRIAXone (ROCEPHIN)  IV  2 g Intravenous Q24H  . cilostazol  100 mg Oral BID  . feeding supplement (GLUCERNA SHAKE)  237 mL Oral BID BM  . feeding supplement (PRO-STAT SUGAR FREE 64)  30 mL Oral TID BM  . gabapentin  300 mg Oral TID  . heparin  5,000 Units Subcutaneous 3 times per day  . hydrochlorothiazide  12.5 mg Oral Daily  . HYDROcodone-acetaminophen  1 tablet Oral BID  . insulin aspart  0-20 Units Subcutaneous TID WC  . insulin aspart  0-5 Units Subcutaneous QHS  . insulin glargine  20 Units Subcutaneous QHS  . lactulose  20 g Oral Once  . lisinopril  20 mg Oral Daily  . modafinil  200 mg Oral BID  . polyethylene glycol  17 g Oral Daily  . senna  1 tablet Oral BID   Continuous Infusions: . lactated ringers 50 mL/hr at 11/05/14 0327    Principal Problem:   Gangrene Active Problems:   Peripheral vascular disease   Diabetes   Chronic diastolic heart failure, grade 2   Dyslipidemia   Wound, surgical, infected   Diabetic foot ulcer

## 2014-11-06 ENCOUNTER — Encounter (HOSPITAL_COMMUNITY): Payer: Self-pay | Admitting: Vascular Surgery

## 2014-11-06 DIAGNOSIS — Z89511 Acquired absence of right leg below knee: Secondary | ICD-10-CM

## 2014-11-06 LAB — BASIC METABOLIC PANEL
Anion gap: 12 (ref 5–15)
BUN: 23 mg/dL (ref 6–23)
CALCIUM: 8 mg/dL — AB (ref 8.4–10.5)
CO2: 24 meq/L (ref 19–32)
CREATININE: 0.75 mg/dL (ref 0.50–1.10)
Chloride: 106 mEq/L (ref 96–112)
GFR calc Af Amer: >90 mL/min (ref >90)
GFR, EST NON AFRICAN AMERICAN: 87 mL/min — AB (ref >90)
GLUCOSE: 135 mg/dL — AB (ref 70–99)
Potassium: 3.6 mEq/L — ABNORMAL LOW (ref 3.7–5.3)
Sodium: 142 mEq/L (ref 137–147)

## 2014-11-06 LAB — CBC
HCT: 29.2 % — ABNORMAL LOW (ref 36.0–46.0)
HEMOGLOBIN: 9.9 g/dL — AB (ref 12.0–15.0)
MCH: 31.8 pg (ref 26.0–34.0)
MCHC: 33.9 g/dL (ref 30.0–36.0)
MCV: 93.9 fL (ref 78.0–100.0)
Platelets: 377 10*3/uL (ref 150–400)
RBC: 3.11 MIL/uL — ABNORMAL LOW (ref 3.87–5.11)
RDW: 13.5 % (ref 11.5–15.5)
WBC: 11.6 10*3/uL — ABNORMAL HIGH (ref 4.0–10.5)

## 2014-11-06 LAB — GLUCOSE, CAPILLARY
GLUCOSE-CAPILLARY: 134 mg/dL — AB (ref 70–99)
GLUCOSE-CAPILLARY: 183 mg/dL — AB (ref 70–99)
Glucose-Capillary: 120 mg/dL — ABNORMAL HIGH (ref 70–99)
Glucose-Capillary: 154 mg/dL — ABNORMAL HIGH (ref 70–99)

## 2014-11-06 LAB — CLOSTRIDIUM DIFFICILE BY PCR: CDIFFPCR: NEGATIVE

## 2014-11-06 NOTE — Clinical Social Work Note (Signed)
CSW consulted for possible SNF placement at time of discharge. CSW met with pt at bedside to attempt assessment. Pt informed CSW pt is in "a lot of pain" and unable to communicate with CSW at this time. CSW updated RN and MD regarding information above. CSW will continue to follow and assist with possible SNF placement at time of discharge.  Lubertha Sayres, [place] ([phone]) Licensed Clinical Social Worker Orthopedics [phone]) and Surgical [phone])

## 2014-11-06 NOTE — Progress Notes (Signed)
Patient was screened by Embree Brawley for appropriateness for an Inpatient Acute Rehab consult.  At this time, we are recommending Inpatient Rehab consult.  Please order when you feel appropriate.   Lina Hitch PT Inpatient Rehab Admissions Coordinator Cell [phone] Office [phone]  

## 2014-11-06 NOTE — Evaluation (Signed)
Physical Therapy Evaluation Patient Details Name: Laurie [name] MRN: [id number] DOB: September 24, 1946 Today's Date: 11/06/2014   History of Present Illness  Adm 11/01/14 with ischemic Rt foot. 11/03/14 underwent Rt BKA  PMHx- DM, HTN, Stg III sacral decub, R CVA with residual Lt weakness, narcolepsy  Clinical Impression  Patient is s/p Rt BKA surgery resulting in functional limitations due to the deficits listed below (see PT Problem List). Limited evaluation due to pt pain, tearful, multiple interruptions (MD, RN, food services), and pt ultimately needing incr time on bedpan. Pt lives with daughter, however her daughter works. ? Has a niece who can assist when her daughter is away. Patient will benefit from skilled PT to increase their independence and safety with mobility to allow discharge to the venue listed below.       Follow Up Recommendations CIR    Equipment Recommendations  Rolling walker with 5" wheels;3in1 (PT);Wheelchair (measurements PT);Wheelchair cushion (measurements PT)    Recommendations for Other Services Rehab consult;OT consult     Precautions / Restrictions Precautions Precautions: Fall      Mobility  Bed Mobility Overal bed mobility: Needs Assistance Bed Mobility: Rolling Rolling: Supervision         General bed mobility comments: use of rail  Transfers                 General transfer comment: assisted pt on bedpan and requiring incr time, therefore deferred  Ambulation/Gait                Stairs            Wheelchair Mobility    Modified Rankin (Stroke Patients Only)       Balance                                             Pertinent Vitals/Pain Pain Assessment: 0-10 Pain Score: 10-Worst pain ever Pain Location: Rt BKA Pain Intervention(s): Limited activity within patient's tolerance;Monitored during session;Repositioned;Patient requesting pain meds-RN notified;RN gave pain meds during session     Home Living Family/patient expects to be discharged to:: Private residence Living Arrangements: Children;Other relatives (daughter, grandaughter) Available Help at Discharge: Family;Available PRN/intermittently Type of Home: Apartment Home Access: Stairs to enter Entrance Stairs-Rails: Right Entrance Stairs-Number of Steps: 1+1 (tall 1st step)   Home Equipment: Cane - single point;Shower seat      Prior Function Level of Independence: Independent with assistive device(s)         Comments: slow but still grocery shopping; used cane off/on     Hand Dominance   Dominant Hand: Left (tending to use Rt since CVA and Lt weakness)    Extremity/Trunk Assessment   Upper Extremity Assessment: LUE deficits/detail       LUE Deficits / Details: limited shoulder ROM (~90 flex and abd); elbow extension 4/5, grip 3+/5   Lower Extremity Assessment: RLE deficits/detail;LLE deficits/detail RLE Deficits / Details: new BKA LLE Deficits / Details: AROM WFL; knee extension 4+/5     Communication   Communication: No difficulties  Cognition Arousal/Alertness: Awake/alert Behavior During Therapy: Anxious (tearful) Overall Cognitive Status: No family/caregiver present to determine baseline cognitive functioning (slow processing; loses track of line of thinking)                      General Comments General comments (skin integrity, edema, etc.): Educated  on positioning and importance of knee extension related to getting a prosthesis. As initiated further exercises for Rt BKA, pt felt she needed to have another BM (has had many, including incontinence due to delayed assistance) and assisted her onto bedpan (she could not hold [facility]     Exercises Amputee Exercises Quad Sets: AROM;Both;5 reps;Supine      Assessment/Plan    PT Assessment Patient needs continued PT services  PT Diagnosis Acute pain;Difficulty walking   PT Problem List Decreased strength;Decreased  range of motion;Decreased activity tolerance;Decreased balance;Decreased mobility;Decreased knowledge of use of DME;Impaired sensation;Pain  PT Treatment Interventions DME instruction;Gait training;Functional mobility training;Therapeutic activities;Therapeutic exercise;Balance training;Patient/family education;Wheelchair mobility training   PT Goals (Current goals can be found in the Care Plan section) Acute Rehab PT Goals Patient Stated Goal: to walk with a prosthesis PT Goal Formulation: With patient Time For Goal Achievement: 11/20/14 Potential to Achieve Goals: Good    Frequency Min 3X/week   Barriers to discharge Decreased caregiver support;Inaccessible home environment high step into apartment; family assist intermittently    Co-evaluation               End of Session   Activity Tolerance: Patient limited by pain Patient left: in bed;with call bell/phone within reach (bed alarm not working) Nurse Communication:  (on bedpan; PT will return later )         Time: 1009-1056 PT Time Calculation (min) (ACUTE ONLY): 47 min   Charges:   PT Evaluation $Initial PT Evaluation Tier I: 1 Procedure PT Treatments $Therapeutic Exercise: 8-22 mins $Therapeutic Activity: 8-22 mins   PT G Codes:          Laurie [name] 11/06/2014, 11:10 AM  Pager [phone]

## 2014-11-06 NOTE — Progress Notes (Addendum)
PROGRESS NOTE  Laurie [name] ZOX:[id number] DOB: [birth year] DOA: 11/01/2014 PCP: Lonia Blood, MD  HPI/Subjective: Patient is a 68 year old AA female with PHM significant for DM, anxiety, HTN, PVD, and nicotine dependence is admitted roughly one week after Right 3rd digit amputation for surgical site infection. Patient was found to have gangrene.  Patient's on 11/05/2014 remains stable, she was assisted out of bed to chair. Remains afebrile, hemodynamically stable  Assessment/Plan: Surgical would infection/Gangrene: Likely secondary PVD and DM2. Seen by WOC who noted that the patent has gangrene. Plain film of right foot shows destruction of the right third toe middle and distal phalanges as well as distal aspect of proximal phalanx with soft tissue gas.  On 11/03/2014 she was taken to the OR undergoing right below the knee amputation, procedure performed by Dr. Darrick Penna. She tolerated procedure well there were no immediate complications. Surgical incision site appears clean Blood cultures obtained on 11/01/2014 growing 1/2 coagulase negative staph, repeat blood cultures no growth to date Patient started on Keflex on 11/05/2014 White count trended down from 20,000-11,000 on am lab work She was seen and evaluated by physical therapy recommended inpatient rehabilitation  HTN: Her lisinopril was increased to 20 mg by mouth daily on 11/04/2014, continue following blood pressures  DM type 2: blood sugars better controlled after increasing Lantus dose to 20 units  Anxiety: Continue home Xanex PRN.   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  She currently ambulates with a walking stick or walker.   Chronic diastolic heart failure grade 2: Stable: no signs of acute heart failure. Echo in 2014 showed LV EF 55/60% and a grade 2 diastolic dysfunction. Continue home medications.   PVD: Secondary to uncontrolled DM, HTN, and age. Angiography performed by Dr.  Jacinto Halim 10/03/2014 which revealed diffuse superficial femoral and tibial occlusive disease. No pedal pulses palpable bilaterally. Continue home medications.   Hyperlipidemia: Chronic. Continue home medications.     DVT Prophylaxis:  Heparin   Code Status: Full Family Communication: None. Patient is alert and agreeable to plan of care.  Disposition Plan: consulted inpatient rehabilitation   Consultants:  WOC   Vascular Surgery  Pharmacy  Procedures:  Right BKA/ AKA planned for 11/13  Antibiotics:  Rocephin 11/11>>11/05/2014  Flagyl 11/11>>11/04/2014  Vancomycin>> 11/04/2014  Keflex 11/15>>  Objective: Filed Vitals:   11/05/14 2106 11/05/14 2145 11/06/14 0512 11/06/14 1338  BP: 124/78 178/83 152/68 182/77  Pulse: 104 89 88 83  Temp: 98 F (36.7 C) 98.8 F (37.1 C) 98.4 F (36.9 C) 98.7 F (37.1 C)  TempSrc: Oral Oral  Oral  Resp: 18 19 17 18   Height:      Weight:      SpO2: 99% 98% 100% 95%    Intake/Output Summary (Last 24 hours) at 11/06/14 1814 Last data filed at 11/06/14 0917  Gross per 24 hour  Intake    340 ml  Output    200 ml  Net    140 ml   Filed Weights   11/01/14 1427 11/01/14 1644  Weight: 72.576 kg (160 lb) 72.576 kg (160 lb)    Exam: General: patient becomes tearful at times reporting pain HEENT:  EOMI, Anicteic Sclera, MMM.  Neck: Supple, no JVD, no masses  Cardiovascular: RRR, S1 S2 auscultated, no rubs, murmurs or gallops.   Respiratory: Clear to auscultation bilaterally with equal chest rise  Abdomen: Soft, nontender, nondistended, + bowel sounds  Extremities: status post below the  knee amputation to her right lower extremity Psych: patient is awake and alert, becomes tearful at times   Data Reviewed: Basic Metabolic Panel:  Recent Labs Lab 11/01/14 1539 11/01/14 2154 11/03/14 0420 11/04/14 0820 11/06/14 0550  NA 137  --  139 137 142  K 2.8*  --  3.1* 4.6 3.6*  CL 100  --  102 101 106  CO2 21  --  23 22 24     GLUCOSE 299*  --  178* 322* 135*  BUN 22  --  15 27* 23  CREATININE 0.93 0.83 0.91 0.95 0.75  CALCIUM 9.2  --  8.5 8.2* 8.0*   Liver Function Tests:  Recent Labs Lab 11/01/14 2154  ALBUMIN 2.0*   CBC:  Recent Labs Lab 11/01/14 1539 11/01/14 2154 11/03/14 0420 11/04/14 0820 11/06/14 0550  WBC 21.4* 18.8* 20.3* 20.8* 11.6*  NEUTROABS 18.6* 15.8*  --   --   --   HGB 9.8* 9.2* 9.1* 9.7* 9.9*  HCT 29.7* 27.1* 26.3* 28.9* 29.2*  MCV 93.1 91.6 90.1 91.7 93.9  PLT 338 315 341 370 377   CBG:  Recent Labs Lab 11/05/14 1709 11/05/14 2220 11/06/14 0724 11/06/14 1138 11/06/14 1734  GLUCAP 191* 219* 120* 134* 183*    Recent Results (from the past 240 hour(s))  Culture, blood (routine x 2)     Status: None (Preliminary result)   Collection Time: 11/01/14  3:27 PM  Result Value Ref Range Status   Specimen Description BLOOD LEFT FOREARM  Final   Special Requests BOTTLES DRAWN AEROBIC AND ANAEROBIC 3ML  Final   Culture  Setup Time   Final    11/01/2014 21:25 Performed at [facility]    Culture   Final           BLOOD CULTURE RECEIVED NO GROWTH TO DATE CULTURE WILL BE HELD FOR 5 DAYS BEFORE ISSUING A FINAL NEGATIVE REPORT Performed at [facility]    Report Status PENDING  Incomplete  Culture, blood (routine x 2)     Status: None   Collection Time: 11/01/14  3:43 PM  Result Value Ref Range Status   Specimen Description BLOOD RIGHT FOREARM  Final   Special Requests BOTTLES DRAWN AEROBIC AND ANAEROBIC 5CC  Final   Culture  Setup Time   Final    11/01/2014 21:23 Performed at [facility]    Culture   Final    STAPHYLOCOCCUS SPECIES (COAGULASE NEGATIVE) Note: THE SIGNIFICANCE OF ISOLATING THIS ORGANISM FROM A SINGLE SET OF BLOOD CULTURES WHEN MULTIPLE SETS ARE DRAWN IS UNCERTAIN. PLEASE NOTIFY THE MICROBIOLOGY DEPARTMENT WITHIN ONE WEEK IF SPECIATION AND SENSITIVITIES ARE REQUIRED. Note: Gram Stain Report Called to,Read Back By and Verified With:  CARLA PULIAO ON 11/02/2014 AT 8:38P BY Serafina MitchellWILEJ Performed at [facility]    Report Status 11/04/2014 FINAL  Final  Blood Cultures x 2 sites     Status: None (Preliminary result)   Collection Time: 11/01/14  9:51 PM  Result Value Ref Range Status   Specimen Description BLOOD RIGHT HAND  Final   Special Requests BOTTLES DRAWN AEROBIC AND ANAEROBIC 3CC  Final   Culture  Setup Time   Final    11/02/2014 03:16 Performed at [facility]    Culture   Final           BLOOD CULTURE RECEIVED NO GROWTH TO DATE CULTURE WILL BE HELD FOR 5 DAYS BEFORE ISSUING A FINAL NEGATIVE REPORT Performed at [facility]  Report Status PENDING  Incomplete  Blood Cultures x 2 sites     Status: None (Preliminary result)   Collection Time: 11/01/14  9:54 PM  Result Value Ref Range Status   Specimen Description BLOOD LEFT ARM  Final   Special Requests BOTTLES DRAWN AEROBIC ONLY 5CC  Final   Culture  Setup Time   Final    11/02/2014 03:16 Performed at [facility]    Culture   Final           BLOOD CULTURE RECEIVED NO GROWTH TO DATE CULTURE WILL BE HELD FOR 5 DAYS BEFORE ISSUING A FINAL NEGATIVE REPORT Performed at [facility]    Report Status PENDING  Incomplete  MRSA PCR Screening     Status: None   Collection Time: 11/02/14  6:06 PM  Result Value Ref Range Status   MRSA by PCR NEGATIVE NEGATIVE Final    Comment:        The GeneXpert MRSA Assay (FDA approved for NASAL specimens only), is one component of a comprehensive MRSA colonization surveillance program. It is not intended to diagnose MRSA infection nor to guide or monitor treatment for MRSA infections.   Clostridium Difficile by PCR     Status: None   Collection Time: 11/06/14 11:29 AM  Result Value Ref Range Status   C difficile by pcr NEGATIVE NEGATIVE Final     Studies: Dg Foot Complete Right  11/01/2014   CLINICAL DATA:  Right foot pain, swelling. Gangrene and right third toe. Diabetic.   EXAM: RIGHT FOOT COMPLETE - 3+ VIEW  COMPARISON:  10/13/2014  FINDINGS: There is been destruction of the right third toe middle and distal phalanges. Lucency also noted in the distal aspect of the proximal third phalanx compatible with osteomyelitis. Extensive subcutaneous gas within the midfoot and forefoot.  IMPRESSION: Destruction of the right third toe middle and distal phalanges as well as the distal aspect of the proximal phalanx. Diffuse soft tissue gas.   Electronically Signed   By: Charlett NoseKevin  Dover M.D.   On: 11/01/2014 16:21    Scheduled Meds: . aspirin EC  81 mg Oral Daily  . atorvastatin  10 mg Oral q1800  . cephALEXin  500 mg Oral 3 times per day  . cilostazol  100 mg Oral BID  . clopidogrel  75 mg Oral Daily  . feeding supplement (GLUCERNA SHAKE)  237 mL Oral BID BM  . feeding supplement (PRO-STAT SUGAR FREE 64)  30 mL Oral TID BM  . gabapentin  300 mg Oral TID  . heparin  5,000 Units Subcutaneous 3 times per day  . hydrochlorothiazide  12.5 mg Oral Daily  . HYDROcodone-acetaminophen  1 tablet Oral BID  . insulin aspart  0-20 Units Subcutaneous TID WC  . insulin aspart  0-5 Units Subcutaneous QHS  . insulin glargine  20 Units Subcutaneous QHS  . lisinopril  20 mg Oral Daily  . modafinil  200 mg Oral BID   Continuous Infusions: . lactated ringers 50 mL/hr at 11/05/14 0327    Principal Problem:   Gangrene Active Problems:   Peripheral vascular disease   Diabetes   Chronic diastolic heart failure, grade 2   Dyslipidemia   Wound, surgical, infected   Diabetic foot ulcer

## 2014-11-06 NOTE — Consult Note (Signed)
Physical Medicine and Rehabilitation Consult   Reason for Consult: R-BKA in a diabetic patient in setting of PAD with poorly healing wound with gangrenous changes.  Referring Physician: Dr. Vanessa BarbaraZamora.    HPI: Laurie [name] is a 68 y.o. 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  1/ 2 Blood cultures positive for coag negative staph. 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 Darrick PennaFields. Post op labile and has had issues with pain control limiting mobility. PT evaluation done today and MD/PT recommending CIR.    Review of Systems  HENT: Negative for hearing loss.   Eyes: Negative for blurred vision and double vision.  Respiratory: Negative for cough and shortness of breath.   Cardiovascular: Negative for chest pain and palpitations.  Gastrointestinal: Positive for constipation. Negative for heartburn and nausea.  Genitourinary: Positive for urgency and frequency.  Musculoskeletal: Positive for myalgias, back pain and joint pain (left hip/left shoulder).  Neurological: Positive for tingling, sensory change and focal weakness (LUE> LLE). Negative for headaches.  Psychiatric/Behavioral: Positive for depression. The patient is nervous/anxious.      Past Medical History  Diagnosis Date  . Hypertension   . Diabetes mellitus   . Chest pain     Troponins up to 0.12 while hospitalized for E.coli PNA  . 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 Bacteremia   . Anemia   . Narcolepsy   . Stroke     Past Surgical History  Procedure Laterality Date  . No past surgeries    . Amputation Right 11/03/2014    Procedure: AMPUTATION BELOW KNEE;  Surgeon: Sherren Kernsharles E Fields, MD;  [facility];  Service: Vascular;  Laterality: Right;     Family History  Problem Relation Age of Onset  . CAD Mother   . CAD Father     Social History:  Lives with family.  Retired. Independent PTA. reports that she has been smoking Cigarettes.  She has a 9.75 pack-year smoking history. She has never used smokeless tobacco. She reports that she does not drink alcohol or use illicit drugs.    Allergies  Allergen Reactions  . Penicillins Hives    Childhood allergy could have been more reaction only remembers hives.     Medications Prior to Admission  Medication Sig Dispense Refill  . ALPRAZolam (XANAX) 0.25 MG tablet Take 0.25 mg by mouth 2 (two) times daily as needed for anxiety.    Marland Kitchen. aspirin EC 81 MG tablet Take 81 mg by mouth daily.    Marland Kitchen. atorvastatin (LIPITOR) 10 MG tablet Take 1 tablet (10 mg total) by mouth daily at 6 PM. 30 tablet 1  . CHOLECALCIFEROL PO Take 1 tablet by mouth daily.    . clopidogrel (PLAVIX) 75 MG tablet Take 75 mg by mouth daily.    Marland Kitchen. co-enzyme Q-10 30 MG capsule Take 100 mg by mouth daily.     Marland Kitchen. doxycycline (VIBRAMYCIN) 100 MG capsule Take 100 mg by mouth 2 (two) times daily.    Marland Kitchen. gabapentin (NEURONTIN) 300 MG capsule Take 1 capsule (300 mg total) by mouth 2 (two) times daily. (Patient taking differently: Take 300 mg by mouth 3 (three) times daily. ) 60 capsule 1  .  HYDROcodone-acetaminophen (NORCO/VICODIN) 5-325 MG per tablet Take 1 tablet by mouth 2 (two) times daily.     Marland Kitchen lisinopril-hydrochlorothiazide (PRINZIDE,ZESTORETIC) 20-25 MG per tablet Take 1 tablet by mouth daily.    . modafinil (PROVIGIL) 200 MG tablet Take 200 mg by mouth 2 (two) times daily.    . naproxen (NAPROSYN) 500 MG tablet Take 500 mg by mouth 2 (two) times daily with a meal.    . Omega-3 Fatty Acids (ULTRA OMEGA 3 PO) Take 1 capsule by mouth daily.    . traMADol-acetaminophen (ULTRACET) 37.5-325 MG per tablet Take 1 tablet by mouth every 6 (six) hours as needed. 30 tablet 0  . cilostazol (PLETAL) 100 MG tablet Take 1 tablet (100 mg total)  by mouth 2 (two) times daily. 60 tablet 3  . lisinopril-hydrochlorothiazide (PRINZIDE,ZESTORETIC) 10-12.5 MG per tablet Take 1 tablet by mouth daily.    . metFORMIN (GLUCOPHAGE) 500 MG tablet Take 1 tablet (500 mg total) by mouth 2 (two) times daily with a meal. For diabetes 60 tablet 1    Home: Home Living Family/patient expects to be discharged to:: Private residence Living Arrangements: Children, Other relatives (daughter, Information systems manager) Available Help at Discharge: Family, Available PRN/intermittently Type of Home: Apartment Home Access: Stairs to enter [employer] of Steps: 1+1 (tall 1st step) Entrance Stairs-Rails: Right Home Equipment: Cane - single point, Shower seat  Functional History: Prior Function Level of Independence: Independent with assistive device(s) Comments: slow but still grocery shopping; used cane off/on Functional Status:  Mobility: Bed Mobility Overal bed mobility: Needs Assistance Bed Mobility: Rolling Rolling: Supervision General bed mobility comments: use of rail; vc for technique; pt sobbing once on her side and even after gentle AROM Rt knee; she refused to attempt sit EOB due to pain Transfers General transfer comment: assisted pt on bedpan and requiring incr time, therefore deferred      ADL:    Cognition: Cognition Overall Cognitive Status: No family/caregiver present to determine baseline cognitive functioning (slow processing; loses track of line of thinking) Orientation Level: Oriented X4 Cognition Arousal/Alertness: Awake/alert (asleep on arrival; easily awakened) Behavior During Therapy: Anxious (tearful) Overall Cognitive Status: No family/caregiver present to determine baseline cognitive functioning (slow processing; loses track of line of thinking)  Blood pressure 152/68, pulse 88, temperature 98.4 F (36.9 C), temperature source Oral, resp. rate 17, height 5\' 7"  (1.702 m), weight 72.576 kg (160 lb), SpO2 100 %. Physical  Exam  Nursing note and vitals reviewed. Constitutional: She is oriented to person, place, and time. She appears well-developed and well-nourished.  Cried thorough most of exam.   HENT:  Head: Normocephalic and atraumatic.  Eyes: Conjunctivae are normal. Pupils are equal, round, and reactive to light.  Neck: Normal range of motion. Neck supple.  Cardiovascular: Normal rate and regular rhythm.   No murmur heard. Respiratory: Effort normal and breath sounds normal. No respiratory distress. She has no wheezes.  GI: Soft. She exhibits no distension. There is no tenderness.  Musculoskeletal:  R-BKA with compressive dressing. Tender to touch.   Neurological: She is alert and oriented to person, place, and time.  Mild left HP. UE: 4/5 prox to distal. LE: 3+hf, 4/5 KE and ankle. No gross sensory findings. Unable to lift right leg off bed. RUE: 5/5.   Skin: Skin is warm and dry.  Psychiatric: Her mood appears anxious.  Lot of anxiety and concerns regarding pain as well as loss of independence.     Results for orders placed or performed during the hospital  encounter of 11/01/14 (from the past 24 hour(s))  Glucose, capillary     Status: Abnormal   Collection Time: 11/05/14  5:09 PM  Result Value Ref Range   Glucose-Capillary 191 (H) 70 - 99 mg/dL  Glucose, capillary     Status: Abnormal   Collection Time: 11/05/14 10:20 PM  Result Value Ref Range   Glucose-Capillary 219 (H) 70 - 99 mg/dL   Comment 1 Notify RN    Comment 2 Documented in Chart   CBC     Status: Abnormal   Collection Time: 11/06/14  5:50 AM  Result Value Ref Range   WBC 11.6 (H) 4.0 - 10.5 K/uL   RBC 3.11 (L) 3.87 - 5.11 MIL/uL   Hemoglobin 9.9 (L) 12.0 - 15.0 g/dL   HCT 16.1 (L) 09.6 - 04.5 %   MCV 93.9 78.0 - 100.0 fL   MCH 31.8 26.0 - 34.0 pg   MCHC 33.9 30.0 - 36.0 g/dL   RDW 40.9 81.1 - 91.4 %   Platelets 377 150 - 400 K/uL  Basic metabolic panel     Status: Abnormal   Collection Time: 11/06/14  5:50 AM  Result  Value Ref Range   Sodium 142 137 - 147 mEq/L   Potassium 3.6 (L) 3.7 - 5.3 mEq/L   Chloride 106 96 - 112 mEq/L   CO2 24 19 - 32 mEq/L   Glucose, Bld 135 (H) 70 - 99 mg/dL   BUN 23 6 - 23 mg/dL   Creatinine, Ser 7.82 0.50 - 1.10 mg/dL   Calcium 8.0 (L) 8.4 - 10.5 mg/dL   GFR calc non Af Amer 87 (L) >90 mL/min   GFR calc Af Amer >90 >90 mL/min   Anion gap 12 5 - 15  Glucose, capillary     Status: Abnormal   Collection Time: 11/06/14  7:24 AM  Result Value Ref Range   Glucose-Capillary 120 (H) 70 - 99 mg/dL  Glucose, capillary     Status: Abnormal   Collection Time: 11/06/14 11:38 AM  Result Value Ref Range   Glucose-Capillary 134 (H) 70 - 99 mg/dL   No results found.  Assessment/Plan: Diagnosis: right BKA 1. Does the need for close, 24 hr/day medical supervision in concert with the patient's rehab needs make it unreasonable for this patient to be served in a less intensive setting? Yes 2. Co-Morbidities requiring supervision/potential complications: hx of right CVA, DM, DM, chf 3. Due to bladder management, bowel management, safety, skin/wound care, disease management, medication administration, pain management and patient education, does the patient require 24 hr/day rehab nursing? Yes 4. Does the patient require coordinated care of a physician, rehab nurse, PT (1-2 hrs/day, 5 days/week) and OT (1-2 hrs/day, 5 days/week) to address physical and functional deficits in the context of the above medical diagnosis(es)? Yes Addressing deficits in the following areas: balance, endurance, locomotion, strength, transferring, bowel/bladder control, bathing, dressing, feeding, grooming, toileting and psychosocial support 5. Can the patient actively participate in an intensive therapy program of at least 3 hrs of therapy per day at least 5 days per week? Yes 6. The potential for patient to make measurable gains while on inpatient rehab is excellent 7. Anticipated functional outcomes upon discharge  from inpatient rehab are modified independent and supervision  with PT, modified independent and supervision with OT, n/a with SLP. 8. Estimated rehab length of stay to reach the above functional goals is: 10-14 days 9. Does the patient have adequate social supports and living environment to  accommodate these discharge functional goals? Yes and Potentially 10. Anticipated D/C setting: Home 11. Anticipated post D/C treatments: HH therapy 12. Overall Rehab/Functional Prognosis: excellent  RECOMMENDATIONS: This patient's condition is appropriate for continued rehabilitative care in the following setting: CIR Patient has agreed to participate in recommended program. Yes Note that insurance prior authorization may be required for reimbursement for recommended care.  Comment: Rehab Admissions Coordinator to follow up.  Thanks,  Ranelle OysterZachary T. Swartz, MD, [place] DomFAAPMR     11/06/2014

## 2014-11-06 NOTE — Progress Notes (Signed)
Physical Therapy Treatment Patient Details Name: Laurie [name] MRN: [id number] DOB: April 06, 1946 Today's Date: 11/06/2014    History of Present Illness Adm 11/01/14 with ischemic Rt foot. 11/03/14 underwent Rt BKA  PMHx- DM, HTN, Stg III sacral decub, R CVA with residual Lt weakness, narcolepsy    PT Comments    On return, pt sleeping and easily aroused. Immediately as she began to move (roll onto Lt side), she became tearful and crying out due to pain. Attempted relaxation techniques and gentle AROM rt knee to decr pain with pt continuing to report pain 10/10. Offered multiple times to assist pt to try sitting on EOB or in chair before lunch arrives and pt too upset to participate further. RN and nurse tech made aware.    Follow Up Recommendations  CIR     Equipment Recommendations  Rolling walker with 5" wheels;3in1 (PT);Wheelchair (measurements PT);Wheelchair cushion (measurements PT)    Recommendations for Other Services Rehab consult;OT consult     Precautions / Restrictions Precautions Precautions: Fall Restrictions Weight Bearing Restrictions: No    Mobility  Bed Mobility Overal bed mobility: Needs Assistance Bed Mobility: Rolling Rolling: Supervision         General bed mobility comments: use of rail; vc for technique; pt sobbing once on her side and even after gentle AROM Rt knee; she refused to attempt sit EOB due to pain  Transfers                 General transfer comment: assisted pt on bedpan and requiring incr time, therefore deferred  Ambulation/Gait                 Stairs            Wheelchair Mobility    Modified Rankin (Stroke Patients Only)       Balance                                    Cognition Arousal/Alertness: Awake/alert (asleep on arrival; easily awakened) Behavior During Therapy: Anxious (tearful) Overall Cognitive Status: No family/caregiver present to determine baseline cognitive  functioning (slow processing; loses track of line of thinking)                      Exercises Amputee Exercises Quad Sets: AROM;Both;5 reps;Supine Knee Flexion: AROM;Right;5 reps;Sidelying (~45 degrees flexion) Knee Extension: AROM;Right;5 reps;Sidelying (lacking ~15 extension)    General Comments General comments (skin integrity, edema, etc.): Educated on positioning and importance of knee extension related to getting a prosthesis. As initiated further exercises for Rt BKA, pt felt she needed to have another BM (has had many, including incontinence due to delayed assistance) and assisted her onto bedpan ([facility] could not hold [facility]       Pertinent Vitals/Pain Pain Assessment: 0-10 Pain Score: 10-Worst pain ever Pain Location: Rt BKA Pain Intervention(s): Limited activity within patient's tolerance;Monitored during session;Repositioned;Patient requesting pain meds-RN notified    Home Living Family/patient expects to be discharged to:: Private residence Living Arrangements: Children;Other relatives (daughter, grandaughter) Available Help at Discharge: Family;Available PRN/intermittently Type of Home: Apartment Home Access: Stairs to enter Entrance Stairs-Rails: Right   Home Equipment: Gilmer MorCane - single point;Shower seat      Prior Function Level of Independence: Independent with assistive device(s)      Comments: slow but still grocery shopping; used cane off/on   PT Goals (current goals  can now be found in the care plan section) Acute Rehab PT Goals Patient Stated Goal: to walk with a prosthesis PT Goal Formulation: With patient Time For Goal Achievement: 11/20/14 Potential to Achieve Goals: Good Progress towards PT goals: Not progressing toward goals - comment (pain)    Frequency  Min 3X/week    PT Plan Current plan remains appropriate    Co-evaluation             End of Session   Activity Tolerance: Patient limited by pain Patient left: in  bed;with call bell/phone within reach;with nursing/sitter in room     Time: 1217-1229 PT Time Calculation (min) (ACUTE ONLY): 12 min  Charges:  $Therapeutic Exercise: 8-22 mins $Therapeutic Activity: 8-22 mins                    G Codes:      Vania Rosero 11/06/2014, 12:40 PM  Pager [phone]

## 2014-11-06 NOTE — Discharge Summary (Signed)
Physician Discharge Summary  OLETA GUNNOE NWG:[id number] DOB: 01-31-1946 DOA: 11/01/2014  PCP: Lonia Blood, MD  Admit date: 11/01/2014 Discharge date: 11/07/2014  Time spent: 60 minutes  Recommendations for Outpatient Follow-up:  1. Continue to monitor for signs of infection and that WBC count continues to trend down.  2. Patient to have surgical staples removed in 1 month according to vascular surgery.  3. Please continue Keflex for 10 days  Discharge Diagnoses:  Principal Problem:   Gangrene Active Problems:   Peripheral vascular disease   Diabetes   Chronic diastolic heart failure, grade 2   Dyslipidemia   Wound, surgical, infected   Diabetic foot ulcer   Gangrene of foot   Hypokalemia   Discharge Condition: Stable  Diet recommendation: Heart Healthy/carb modified   Filed Weights   11/01/14 1427 11/01/14 1644  Weight: 72.576 kg (160 lb) 72.576 kg (160 lb)    History of present illness:  Patient is a 68 year old female with a past medical history significant for DM, anxiety, HTN, PVD, and nicotine dependence. She presented to the hospital for an infection of her right third digit. According to patient she had the right third digit amputated roughly one week prior.   Hospital Course:  In the ED patient was evaluated by WOC and found to have gangrene of the right foot. Plain film of the right foot showed destruction of the right third toe middle and distal phalanges as well as the distal aspect of the proximal phalanx with soft tissue gas. She was admitted and vascular surgery was consulted. 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 Prior to surgery patient was showing signs of systemic infection. She was started on Flagyl, Vacomycin, and Rocephin. Patient tolerated the surgery well. WBC 11.6 on discharge (down from 20.8 on 11/14). Patient  afebrile for over 24 hours. Switched IV analgesics to PO and switched onto Keflex PO on  11/15. Patient seen by PT who recommended CIR.   Surgical would infection/Gangrene: Likely secondary PVD and DM2. Seen by WOC who noted that the patent had gangrene. Plain film of right foot showed destruction of the right third toe middle and distal phalanges as well as distal aspect of proximal phalanx with soft tissue gas. Patient appeared somewhat toxic prior to surgery. WBC in the 20s. Started on Flagyl, Vanc, Rocephin. Successful BKA on 11/13, procedure performed by Dr. Darrick Penna. Patient tolerated procedure well. Surgical incision site remains clean. Blood cultures obtained on 11/01/2014 growing 1/2 coagulase negative staph, repeated blood cultures no growth to date. Discontinued ceftriaxone, start oral Keflex on 11/15. Will continue outpatient for 2 weeks. Per vascular staples to be removed in 1 month.   HTN: Chronic and likely exacerbated by pain. Her lisinopril was increased to 20 mg by mouth daily on 11/04/2014, 11/16 am BP reading 152/68. At the time, patient had not request pain medications and had just finished her PT evaluation. Will recheck prior to discharge.   DM type 2: Chronic. Patient had blood sugars in the 300 range on 11/15,  Lantus was  increased to 20 units subcutaneous daily, meanwhile contining Accu-Cheks before every meal and daily at bedtime with sliding scale coverage. CBG on 11/16 am of 120.   Anxiety: Continue home Xanex PRN. Patient is less anxious upon discharge.   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 She currently ambulates with a walker. PT recommends CIR to help with her ambulation using  her Left leg.   Chronic diastolic heart failure grade 2: Stable: Patient showed no signs of acute heart failure during hospital course. Echo in 2014 showed LV EF 55/60% and a grade 2 diastolic dysfunction. Continued home medications.   PVD: Secondary to uncontrolled DM, HTN, and age. Angiography performed by Dr. Jacinto HalimGanji 10/03/2014 revealed  diffuse superficial femoral and tibial occlusive disease. No pedal pulses palpable bilaterally. Continued home medications.    Hyperlipidemia: Chronic. Continued home medications.   Procedures:  Right BKA on 11/13  Consultations:  PT  Vascular Surgery   Discharge Exam: Filed Vitals:   11/07/14 0607  BP: 160/76  Pulse: 93  Temp: 98.3 F (36.8 C)  Resp: 17   General: Patient is not tearful today, She is awake and talkative with staff HEENT: EOMI, Anicteic Sclera, MMM.  Neck: Supple, no JVD, no masses  Cardiovascular: RRR, S1 S2 auscultated, no rubs, murmurs or gallops.  Respiratory: Clear to auscultation bilaterally with equal chest rise  Abdomen: Soft, nontender, nondistended, + bowel sounds  Extremities: S/p BKA to her right lower extremity, surgical dressing dry and intact, LLE without edema   Discharge Instructions    Call MD for:  difficulty breathing, headache or visual disturbances    Complete by:  As directed      Call MD for:  extreme fatigue    Complete by:  As directed      Call MD for:  hives    Complete by:  As directed      Call MD for:  persistant dizziness or light-headedness    Complete by:  As directed      Call MD for:  persistant nausea and vomiting    Complete by:  As directed      Call MD for:  redness, tenderness, or signs of infection (pain, swelling, redness, odor or green/yellow discharge around incision site)    Complete by:  As directed      Call MD for:  severe uncontrolled pain    Complete by:  As directed      Call MD for:  temperature >100.4    Complete by:  As directed      Diet - low sodium heart healthy    Complete by:  As directed      Increase activity slowly    Complete by:  As directed           Current Discharge Medication List    START taking these medications   Details  cephALEXin (KEFLEX) 500 MG capsule Take 1 capsule (500 mg total) by mouth every 8 (eight) hours. Qty: 30 capsule, Refills: 0     hydrochlorothiazide (MICROZIDE) 12.5 MG capsule Take 1 capsule (12.5 mg total) by mouth daily. Qty: 30 capsule, Refills: 1    insulin glargine (LANTUS) 100 UNIT/ML injection Inject 0.2 mLs (20 Units total) into the skin at bedtime. Qty: 10 mL, Refills: 11    lisinopril (PRINIVIL,ZESTRIL) 20 MG tablet Take 1 tablet (20 mg total) by mouth daily. Qty: 30 tablet, Refills: 1      CONTINUE these medications which have CHANGED   Details  HYDROcodone-acetaminophen (NORCO/VICODIN) 5-325 MG per tablet Take 1 tablet by mouth 2 (two) times daily. Qty: 20 tablet, Refills: 0      CONTINUE these medications which have NOT CHANGED   Details  ALPRAZolam (XANAX) 0.25 MG tablet Take 0.25 mg by mouth 2 (two) times daily as needed for anxiety.    aspirin EC 81 MG tablet Take 81  mg by mouth daily.    atorvastatin (LIPITOR) 10 MG tablet Take 1 tablet (10 mg total) by mouth daily at 6 PM. Qty: 30 tablet, Refills: 1    CHOLECALCIFEROL PO Take 1 tablet by mouth daily.    clopidogrel (PLAVIX) 75 MG tablet Take 75 mg by mouth daily.    co-enzyme Q-10 30 MG capsule Take 100 mg by mouth daily.     gabapentin (NEURONTIN) 300 MG capsule Take 1 capsule (300 mg total) by mouth 2 (two) times daily. Qty: 60 capsule, Refills: 1    modafinil (PROVIGIL) 200 MG tablet Take 200 mg by mouth 2 (two) times daily.    Omega-3 Fatty Acids (ULTRA OMEGA 3 PO) Take 1 capsule by mouth daily.    cilostazol (PLETAL) 100 MG tablet Take 1 tablet (100 mg total) by mouth 2 (two) times daily. Qty: 60 tablet, Refills: 3      STOP taking these medications     doxycycline (VIBRAMYCIN) 100 MG capsule      lisinopril-hydrochlorothiazide (PRINZIDE,ZESTORETIC) 20-25 MG per tablet      naproxen (NAPROSYN) 500 MG tablet      traMADol-acetaminophen (ULTRACET) 37.5-325 MG per tablet      lisinopril-hydrochlorothiazide (PRINZIDE,ZESTORETIC) 10-12.5 MG per tablet      metFORMIN (GLUCOPHAGE) 500 MG tablet        Allergies   Allergen Reactions  . Penicillins Hives    Childhood allergy could have been more reaction only remembers hives.    Follow-up Information    Follow up with [facility], MD In 2 weeks.   Specialty:  Internal Medicine   Contact information:   [address] [place] [address] [phone]       Follow up with Sherren Kerns, MD In 2 weeks.   Specialty:  Vascular Surgery   Contact information:   [address] [place] [address] [phone]        The results of significant diagnostics from this hospitalization (including imaging, microbiology, ancillary and laboratory) are listed below for reference.    Significant Diagnostic Studies: Dg Foot Complete Right  11-19-14   CLINICAL DATA:  Right foot pain, swelling. Gangrene and right third toe. Diabetic.  EXAM: RIGHT FOOT COMPLETE - 3+ VIEW  COMPARISON:  10/13/2014  FINDINGS: There is been destruction of the right third toe middle and distal phalanges. Lucency also noted in the distal aspect of the proximal third phalanx compatible with osteomyelitis. Extensive subcutaneous gas within the midfoot and forefoot.  IMPRESSION: Destruction of the right third toe middle and distal phalanges as well as the distal aspect of the proximal phalanx. Diffuse soft tissue gas.   Electronically Signed   By: Charlett Nose M.D.   On: 2014-11-19 16:21   Dg Toe 3rd Right  10/13/2014   CLINICAL DATA:  Initial encounter for Pain and swelling to the right middle toe after laceration approximately 1 month ago.  EXAM: RIGHT THIRD TOE  COMPARISON:  None.  FINDINGS: Three-view exam shows soft tissue defect in the tip of the middle toe. The tuft of the distal phalanx of the middle toe is absent which would be very suggestive for associated bony infection.  IMPRESSION: Apparent bony resorption involving the tip of the middle toe distal phalanx suggests osteomyelitis associated with the soft tissue wound.   Electronically Signed   By: Kennith Center M.D.    On: 10/13/2014 12:49    Microbiology: Recent Results (from the past 240 hour(s))  Culture, blood (routine x 2)  Status: None   Collection Time: 11/01/14  3:27 PM  Result Value Ref Range Status   Specimen Description BLOOD LEFT FOREARM  Final   Special Requests BOTTLES DRAWN AEROBIC AND ANAEROBIC 3ML  Final   Culture  Setup Time   Final    11/01/2014 21:25 Performed at [facility]    Culture   Final    NO GROWTH 5 DAYS Performed at [facility]    Report Status 11/07/2014 FINAL  Final  Culture, blood (routine x 2)     Status: None   Collection Time: 11/01/14  3:43 PM  Result Value Ref Range Status   Specimen Description BLOOD RIGHT FOREARM  Final   Special Requests BOTTLES DRAWN AEROBIC AND ANAEROBIC 5CC  Final   Culture  Setup Time   Final    11/01/2014 21:23 Performed at [facility]    Culture   Final    STAPHYLOCOCCUS SPECIES (COAGULASE NEGATIVE) Note: THE SIGNIFICANCE OF ISOLATING THIS ORGANISM FROM A SINGLE SET OF BLOOD CULTURES WHEN MULTIPLE SETS ARE DRAWN IS UNCERTAIN. PLEASE NOTIFY THE MICROBIOLOGY DEPARTMENT WITHIN ONE WEEK IF SPECIATION AND SENSITIVITIES ARE REQUIRED. Note: Gram Stain Report Called to,Read Back By and Verified With: CARLA PULIAO ON 11/02/2014 AT 8:38P BY WILEJ Performed at [facility]    Report Status 11/04/2014 FINAL  Final  Blood Cultures x 2 sites     Status: None (Preliminary result)   Collection Time: 11/01/14  9:51 PM  Result Value Ref Range Status   Specimen Description BLOOD RIGHT HAND  Final   Special Requests BOTTLES DRAWN AEROBIC AND ANAEROBIC 3CC  Final   Culture  Setup Time   Final    11/02/2014 03:16 Performed at [facility]    Culture   Final           BLOOD CULTURE RECEIVED NO GROWTH TO DATE CULTURE WILL BE HELD FOR 5 DAYS BEFORE ISSUING A FINAL NEGATIVE REPORT Performed at [facility]    Report Status PENDING  Incomplete  Blood Cultures x 2 sites     Status: None  (Preliminary result)   Collection Time: 11/01/14  9:54 PM  Result Value Ref Range Status   Specimen Description BLOOD LEFT ARM  Final   Special Requests BOTTLES DRAWN AEROBIC ONLY 5CC  Final   Culture  Setup Time   Final    11/02/2014 03:16 Performed at [facility]    Culture   Final           BLOOD CULTURE RECEIVED NO GROWTH TO DATE CULTURE WILL BE HELD FOR 5 DAYS BEFORE ISSUING A FINAL NEGATIVE REPORT Performed at [facility]    Report Status PENDING  Incomplete  MRSA PCR Screening     Status: None   Collection Time: 11/02/14  6:06 PM  Result Value Ref Range Status   MRSA by PCR NEGATIVE NEGATIVE Final    Comment:        The GeneXpert MRSA Assay (FDA approved for NASAL specimens only), is one component of a comprehensive MRSA colonization surveillance program. It is not intended to diagnose MRSA infection nor to guide or monitor treatment for MRSA infections.   Clostridium Difficile by PCR     Status: None   Collection Time: 11/06/14 11:29 AM  Result Value Ref Range Status   C difficile by pcr NEGATIVE NEGATIVE Final     Labs: Basic Metabolic Panel:  Recent Labs Lab 11/01/14 1539 11/01/14 2154 11/03/14 0420  11/04/14 0820 11/06/14 0550  NA 137  --  139 137 142  K 2.8*  --  3.1* 4.6 3.6*  CL 100  --  102 101 106  CO2 21  --  23 22 24   GLUCOSE 299*  --  178* 322* 135*  BUN 22  --  15 27* 23  CREATININE 0.93 0.83 0.91 0.95 0.75  CALCIUM 9.2  --  8.5 8.2* 8.0*   Liver Function Tests:  Recent Labs Lab 11/01/14 2154  ALBUMIN 2.0*   CBC:  Recent Labs Lab 11/01/14 1539 11/01/14 2154 11/03/14 0420 11/04/14 0820 11/06/14 0550  WBC 21.4* 18.8* 20.3* 20.8* 11.6*  NEUTROABS 18.6* 15.8*  --   --   --   HGB 9.8* 9.2* 9.1* 9.7* 9.9*  HCT 29.7* 27.1* 26.3* 28.9* 29.2*  MCV 93.1 91.6 90.1 91.7 93.9  PLT 338 315 341 370 377   CBG:  Recent Labs Lab 11/06/14 1138 11/06/14 1734 11/06/14 2130 11/07/14 0758 11/07/14 1205  GLUCAP 134*  183* 154* 87 149*       Signed:  Insiya Oshea PA-S [facility] 11/07/2014, 1:09 PM  Addendum:  I personally saw and evaluated patient on 11/07/2014 and agree with the above findings. Patient is a pleasant 68 year old female with a history of peripheral vascular disease who was admitted to the medicine service on 11/01/2014. She had a recent agitation of third digit of right foot presented with complaints of increasing pain, swelling and discoloration of her right foot. On initial evaluation she was found to have necrotized tissue with associated foul-smelling odor on dorsal aspect of foot, consistent with gangrene. Dr. Darrick Penna of vascular surgery was consulted as she was taken to the operating room on 11/03/2014 undergoing right below the knee amputation.She tolerated procedure well. Post-procedure patient complains of phantom pain. Physical therapy was consulted as she was assisted out of bed to chair. Initially she was treated with Rocephin, Flagyl, vancomycin, transition to oral Keflex on 11/05/2014. Social work was consulted for disposition planning. On the day of discharge she was stable, out of bed to chair, tolerating by mouth intake, remained afebrile. She was discharged to SNF on 11/07/2014.

## 2014-11-07 DIAGNOSIS — E876 Hypokalemia: Secondary | ICD-10-CM

## 2014-11-07 DIAGNOSIS — I96 Gangrene, not elsewhere classified: Secondary | ICD-10-CM | POA: Insufficient documentation

## 2014-11-07 LAB — CULTURE, BLOOD (ROUTINE X 2): Culture: NO GROWTH

## 2014-11-07 LAB — GLUCOSE, CAPILLARY
GLUCOSE-CAPILLARY: 87 mg/dL (ref 70–99)
Glucose-Capillary: 149 mg/dL — ABNORMAL HIGH (ref 70–99)
Glucose-Capillary: 201 mg/dL — ABNORMAL HIGH (ref 70–99)

## 2014-11-07 MED ORDER — HYDROCHLOROTHIAZIDE 12.5 MG PO CAPS: 12.5000 mg | ORAL_CAPSULE | Freq: Every day | ORAL | Status: DC

## 2014-11-07 MED ORDER — INSULIN GLARGINE 100 UNIT/ML ~~LOC~~ SOLN: 20.0000 [IU] | Freq: Every day | SUBCUTANEOUS | Status: DC

## 2014-11-07 MED ORDER — CEPHALEXIN 500 MG PO CAPS: 500.0000 mg | ORAL_CAPSULE | Freq: Three times a day (TID) | ORAL | Status: DC

## 2014-11-07 MED ORDER — LISINOPRIL 20 MG PO TABS: 20.0000 mg | ORAL_TABLET | Freq: Every day | ORAL | Status: DC

## 2014-11-07 MED ORDER — HYDROCODONE-ACETAMINOPHEN 5-325 MG PO TABS: 1.0000 | ORAL_TABLET | Freq: Two times a day (BID) | ORAL | Status: DC

## 2014-11-07 NOTE — Clinical Social Work Note (Signed)
[facility].  Pt and pts daughter Shanda Bumps([name]) updated at bedside.   [facility]: [phone] Transportation: EMS Sharin Mons([facility]) scheduled for 4:30  Per RN.  Claude MangesKierra S. Ronav Furney, BSW-Intern

## 2014-11-07 NOTE — Progress Notes (Signed)
Orthopedic Tech Progress Note Patient Details:  Druscilla Brownienne W Hon 07/18/1946 [id number] [employer] called for brace order Patient ID: Druscilla BrownieAnne W Sawyers, female   DOB: 07/18/1946, 68 y.o.   MRN: [id number]   Orie Routsia R Thompson 11/07/2014, 12:10 PM

## 2014-11-07 NOTE — Progress Notes (Signed)
  Progress Note    11/07/2014 10:26 AM 4 Days Post-Op  Subjective:  Tearful this am--states she can't look at her leg  afebrile  Filed Vitals:   11/07/14 0607  BP: 160/76  Pulse: 93  Temp: 98.3 F (36.8 C)  Resp: 17    Physical Exam: Incisions:  C/d/i with staples in tact.  There is no drainage from stump Extremities:  Good movement of leg  CBC    Component Value Date/Time   WBC 11.6* 11/06/2014 0550   RBC 3.11* 11/06/2014 0550   HGB 9.9* 11/06/2014 0550   HCT 29.2* 11/06/2014 0550   PLT 377 11/06/2014 0550   MCV 93.9 11/06/2014 0550   MCH 31.8 11/06/2014 0550   MCHC 33.9 11/06/2014 0550   RDW 13.5 11/06/2014 0550   LYMPHSABS 1.8 11/01/2014 2154   MONOABS 1.1* 11/01/2014 2154   EOSABS 0.1 11/01/2014 2154   BASOSABS 0.0 11/01/2014 2154    BMET    Component Value Date/Time   NA 142 11/06/2014 0550   K 3.6* 11/06/2014 0550   CL 106 11/06/2014 0550   CO2 24 11/06/2014 0550   GLUCOSE 135* 11/06/2014 0550   BUN 23 11/06/2014 0550   CREATININE 0.75 11/06/2014 0550   CALCIUM 8.0* 11/06/2014 0550   GFRNONAA 87* 11/06/2014 0550   GFRAA >90 11/06/2014 0550    INR    Component Value Date/Time   INR 1.03 10/03/2014 0606     Intake/Output Summary (Last 24 hours) at 11/07/14 1026 Last data filed at 11/07/14 0500  Gross per 24 hour  Intake    420 ml  Output      0 ml  Net    420 ml     Assessment/Plan:  68 y.o. female is s/p right below knee amputation  4 Days Post-Op  -pt stump is healing nicely and is viable. -will ask [employer] to see her for limb guard and information on the process for prosthesis.   Doreatha MassedSamantha Charmine Bockrath, PA-C [facility] [phone] 11/07/2014 10:26 AM

## 2014-11-07 NOTE — Progress Notes (Addendum)
Patient D/C to SNF.  Transported by [facility]. 

## 2014-11-07 NOTE — Clinical Social Work Psychosocial (Signed)
Clinical Social Work Department BRIEF PSYCHOSOCIAL ASSESSMENT 11/07/2014  Patient:  Laurie [name], Laurie [name]     Account Number:  [id number]     Admit date:  11/01/2014  Clinical Social Worker:  Delrae Sawyers  Date/Time:  11/07/2014 01:57 PM  Referred by:  Physician  Date Referred:  11/07/2014 Referred for  SNF Placement   Other Referral:   none.   Interview type:  Patient Other interview type:   none.    PSYCHOSOCIAL DATA Living Status:  ALONE Admitted from facility:   Level of care:   Primary support name:  Laurie [name] [phone]) Primary support relationship to patient:  CHILD, ADULT Degree of support available:   Strong support system.    CURRENT CONCERNS Current Concerns  Post-Acute Placement   Other Concerns:   none.    SOCIAL WORK ASSESSMENT / PLAN CSW received referral for possible SNF placement. CSW met with pt at bedside to discuss SNF as possible discharge disposition. Pt stated pt lives alone, but pt's daughter lives in [place] and is able to provide support. CSW discussed PT recommendation for SNF as back-up disposition to CIR. CSW to continue to follow and assist with discharge planning.   Assessment/plan status:  Psychosocial Support/Ongoing Assessment of Needs Other assessment/ plan:   none.   Information/referral to community resources:   [facility] bed offers.    PATIENT'S/FAMILY'S RESPONSE TO PLAN OF CARE: Pt understanding and agreeable to CSW plan of care. Pt expressed no further questions or concerns at this time.       Lubertha Sayres, [place] [phone]) Licensed Clinical Social Worker Orthopedics [phone]) and Surgical [phone])

## 2014-11-07 NOTE — Clinical Social Work Placement (Addendum)
Clinical Social Work Department CLINICAL SOCIAL WORK PLACEMENT NOTE 11/07/2014  Patient:  Laurie [name],[name]  Account Number:  [id number] Admit date:  11/01/2014  Clinical Social Worker:  Mosie EpsteinEMILY S VAUGHN, LCSWA  Date/time:  11/07/2014 02:05 PM  Clinical Social Work is seeking post-discharge placement for this patient at the following level of care:   SKILLED NURSING   (*CSW will update this form in Epic as items are completed)   11/07/2014  Patient/family provided with [facility] Department of Clinical Social Work's list of facilities offering this level of care within the geographic area requested by the patient (or if unable, by the patient's family).  11/07/2014  Patient/family informed of their freedom to choose among providers that offer the needed level of care, that participate in Medicare, Medicaid or managed care program needed by the patient, have an available bed and are willing to accept the patient.  11/07/2014  Patient/family informed of [facility]' ownership interest in [facility], as well as of the fact that they are under no obligation to receive care at this facility.  PASARR submitted to EDS on 11/07/2014 PASARR number received on 11/07/2014  FL2 transmitted to all facilities in geographic area requested by pt/family on  11/07/2014 FL2 transmitted to all facilities within larger geographic area on   Patient informed that his/her managed care company has contracts with or will negotiate with  certain facilities, including the following:     Patient/family informed of bed offers received:  11/07/2014 [facility] Physician recommends and patient chooses bed at    [facility] be [facility] on 11/07/2014  Patient to be transferred to facility by [facility] Patient and family notified of transfer on 11/07/2014 Name of family member notified:  Pt and pt's daughter Shanda BumpsJessica notified at bedside.   Claude MangesKierra S.Nayana Lenig,  BSW-Intern The following physician request were entered in Epic:   Additional Comments:  Lily Kochermily Vaughn, LCSWA [phone]) Licensed Clinical Social Worker Orthopedics [phone]) and Surgical [phone])

## 2014-11-07 NOTE — Progress Notes (Signed)
Physical Therapy Treatment Patient Details Name: Laurie [name] MRN: [id number] DOB: 1946-04-17 Today's Date: 11/07/2014    History of Present Illness Adm 11/01/14 with ischemic Rt foot. 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 Cognitively clearer. Pt is very motivated, however her anxiety may slow her progress (although she did transfer x 3 with PT today, and did none 11/16). Family support needs to be more clearly identified to assist with d/c planning. Feel pt can fully participate and tolerate 3+ hours therapy/day.   Follow Up Recommendations  CIR     Equipment Recommendations  Rolling walker with 5" wheels;3in1 (PT);Wheelchair (measurements PT);Wheelchair cushion (measurements PT)    Recommendations for Other Services Rehab consult;OT consult     Precautions / Restrictions Precautions Precautions: Fall    Mobility  Bed Mobility Overal bed mobility: Needs Assistance Bed Mobility: Rolling;Sidelying to Sit Rolling: Supervision (with rail) Sidelying to sit: Mod assist (with rail)       General bed mobility comments: use of rail; vc for technique; Lt UE weakness from prior CVA and required assist to raise from her Lt elbow to Lt hand  Transfers Overall transfer level: Needs assistance Equipment used: Rolling walker (2 wheeled) Transfers: Sit to/from [employer];Squat Pivot Transfers Sit to Stand: Mod assist;+2 physical assistance;+2 safety/equipment   Squat pivot transfers: Mod assist;+2 physical assistance;+2 safety/equipment     General transfer comment: Squat -pivot to her Lt x 2 ([facility])--pt anxious/fearful when moving her hand from surface to surface; pt preferred pivoting without hospital sock ("too sticky" and didn't allow her to pivot well; attempted stand to walker  with pt unable to move hands from armrests of BSC to RW due to fear and weakness  Ambulation/Gait                 Stairs            Wheelchair Mobility    Modified Rankin (Stroke Patients Only)       Balance Overall balance assessment: Needs assistance Sitting-balance support: Bilateral upper extremity supported;Feet supported Sitting balance-Leahy Scale: Poor Sitting balance - Comments: loss of balance posterior and to the Lt x1 required min assist to recover       Standing balance comment: unable to fully stand                    Cognition Arousal/Alertness: Awake/alert (asleep on arrival; easily awakened) Behavior During Therapy: Anxious Overall Cognitive Status: Within Functional Limits for tasks assessed                      Exercises      General Comments General comments (skin integrity, edema, etc.): Less painful this date. Pt continues to talk excessively, however cognition much clearer      Pertinent Vitals/Pain Pain Assessment: 0-10 Pain Score: 8  Pain Location: Rt BKA Pain Intervention(s): Limited activity within patient's tolerance;Monitored during session;Premedicated before session;Repositioned    Home Living                      Prior Function            PT Goals (current goals can now be found in the care plan section) Acute Rehab PT Goals  Patient Stated Goal: to walk with a prosthesis Progress towards PT goals: Progressing toward goals    Frequency  Min 3X/week    PT Plan Current plan remains appropriate    Co-evaluation             End of Session Equipment Utilized During Treatment: Gait belt Activity Tolerance: Patient limited by fatigue Patient left: with call bell/phone within reach;in chair;with chair alarm set     Time: 1126-1200 PT Time Calculation (min) (ACUTE ONLY): 34 min  Charges:  $Therapeutic Activity: 23-37 mins                    G Codes:      Laurie [name] 11/07/2014,  12:13 PM  Pager [phone]

## 2014-11-08 ENCOUNTER — Other Ambulatory Visit: Payer: Self-pay | Admitting: *Deleted

## 2014-11-08 LAB — CULTURE, BLOOD (ROUTINE X 2)
CULTURE: NO GROWTH
Culture: NO GROWTH

## 2014-11-08 MED ORDER — HYDROCODONE-ACETAMINOPHEN 5-325 MG PO TABS: ORAL_TABLET | ORAL | Status: DC

## 2014-11-08 MED ORDER — ALPRAZOLAM 0.25 MG PO TABS: ORAL_TABLET | ORAL | Status: DC

## 2014-11-08 MED ORDER — MODAFINIL 200 MG PO TABS: ORAL_TABLET | ORAL | Status: DC

## 2014-11-08 NOTE — Telephone Encounter (Signed)
[facility] 

## 2014-11-09 ENCOUNTER — Other Ambulatory Visit: Payer: Self-pay | Admitting: *Deleted

## 2014-11-09 MED ORDER — HYDROCODONE-ACETAMINOPHEN 5-325 MG PO TABS: ORAL_TABLET | ORAL | Status: DC

## 2014-11-09 NOTE — Telephone Encounter (Signed)
[facility] 

## 2014-11-10 ENCOUNTER — Other Ambulatory Visit: Payer: Self-pay | Admitting: *Deleted

## 2014-11-10 ENCOUNTER — Non-Acute Institutional Stay (SKILLED_NURSING_FACILITY): Payer: Medicare Other | Admitting: Internal Medicine

## 2014-11-10 DIAGNOSIS — F32A Depression, unspecified: Secondary | ICD-10-CM

## 2014-11-10 DIAGNOSIS — F329 Major depressive disorder, single episode, unspecified: Secondary | ICD-10-CM

## 2014-11-10 DIAGNOSIS — E785 Hyperlipidemia, unspecified: Secondary | ICD-10-CM

## 2014-11-10 DIAGNOSIS — I10 Essential (primary) hypertension: Secondary | ICD-10-CM

## 2014-11-10 DIAGNOSIS — I739 Peripheral vascular disease, unspecified: Secondary | ICD-10-CM

## 2014-11-10 DIAGNOSIS — I5032 Chronic diastolic (congestive) heart failure: Secondary | ICD-10-CM

## 2014-11-10 DIAGNOSIS — E1152 Type 2 diabetes mellitus with diabetic peripheral angiopathy with gangrene: Secondary | ICD-10-CM

## 2014-11-10 DIAGNOSIS — I6350 Cerebral infarction due to unspecified occlusion or stenosis of unspecified cerebral artery: Secondary | ICD-10-CM

## 2014-11-10 DIAGNOSIS — I96 Gangrene, not elsewhere classified: Secondary | ICD-10-CM

## 2014-11-10 MED ORDER — ALPRAZOLAM 0.25 MG PO TABS: ORAL_TABLET | ORAL | Status: DC

## 2014-11-10 NOTE — Progress Notes (Signed)
MRN: [id number] Name: Laurie [name]  Sex: female Age: 68 y.o. DOB: [birth year]  [name] Facility/Room: 303 Level Of Care: SNF Provider: Merrilee SeashoreALEXANDER, Norabelle D Emergency Contacts: Extended Emergency Contact Information Primary Emergency Contact: [name],[name]          [place], [place] [id number] [place] Home Phone: [phone] Relation: Daughter Secondary Emergency Contact: Hulen SkainsWilliamson,Velma  [place] Mobile Phone: [phone] Relation: Sister  Code Status: FULL  Allergies: Penicillins  Chief Complaint  Patient presents with  . New Admit To SNF    HPI: Patient is 68 y.o. female who is admitted to SNF for OT/PT s/p R BKA.  Past Medical History  Diagnosis Date  . Hypertension   . Diabetes mellitus   . Chest pain     Troponins up to 0.12 while hospitalized for E.coli PNA  . 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 Bacteremia   . Anemia   . Narcolepsy   . Stroke     Past Surgical History  Procedure Laterality Date  . No past surgeries    . Amputation Right 11/03/2014    Procedure: AMPUTATION BELOW KNEE;  Surgeon: Sherren Kernsharles E Fields, MD;  [facility];  Service: Vascular;  Laterality: Right;      Medication List       This list is accurate as of: 11/10/14 11:59 PM.  Always use your most recent med list.               ALPRAZolam 0.25 MG tablet  Commonly known as:  XANAX  Take one tablet by mouth twice daily as needed for anxiety     aspirin EC 81 MG tablet  Take 81 mg by mouth daily.     atorvastatin 10 MG tablet  Commonly known as:  LIPITOR  Take 1 tablet (10 mg total) by mouth daily at 6 PM.     cephALEXin 500 MG capsule  Commonly known as:  KEFLEX  Take 1 capsule (500 mg total) by mouth every 8 (eight) hours.     CHOLECALCIFEROL PO  Take 1 tablet by mouth daily.     cilostazol 100 MG tablet  Commonly known as:  PLETAL  Take 1 tablet (100 mg total) by mouth 2 (two) times daily.     clopidogrel 75 MG tablet  Commonly known as:  PLAVIX  Take 75 mg by mouth daily.     co-enzyme Q-10 30 MG capsule  Take 100 mg by mouth daily.     gabapentin 300 MG capsule  Commonly known as:  NEURONTIN  Take 1 capsule (300 mg total) by mouth 2 (two) times daily.     hydrochlorothiazide 12.5 MG capsule  Commonly known as:  MICROZIDE  Take 1 capsule (12.5 mg total) by mouth daily.     HYDROcodone-acetaminophen 5-325 MG per tablet  Commonly known as:  NORCO/VICODIN  Take one tablet by mouth every 6 hours as needed for pain     insulin glargine 100 UNIT/ML injection  Commonly known as:  LANTUS  Inject 0.2 mLs (20 Units total) into the skin at bedtime.     lisinopril 20 MG tablet  Commonly known as:  PRINIVIL,ZESTRIL  Take 1 tablet (20 mg total) by mouth daily.     modafinil 200 MG tablet  Commonly known as:  PROVIGIL  Take one tablet by mouth twice daily for narcolepsy     ULTRA OMEGA 3 PO  Take 1  capsule by mouth daily.        No orders of the defined types were placed in this encounter.    Immunization History  Administered Date(s) Administered  . Influenza,inj,Quad PF,36+ Mos 10/26/2013, 11/01/2014  . Pneumococcal Polysaccharide-23 12/22/2005, 10/28/2013    History  Substance Use Topics  . Smoking status: Current Every Day Smoker -- 0.25 packs/day for 39 years    Types: Cigarettes  . Smokeless tobacco: Never Used  . Alcohol Use: No    Family history is noncontributory    Review of Systems  DATA OBTAINED: from patient, nurse GENERAL:  no fevers, fatigue, appetite changes SKIN: No itching, rash  EYES: No eye pain, redness, discharge EARS: No earache, tinnitus, change in hearing NOSE: No congestion, drainage or bleeding  MOUTH/THROAT: No mouth or tooth pain, No sore throat RESPIRATORY: No cough, wheezing, SOB CARDIAC: No chest pain, palpitations, lower extremity edema  GI: No abdominal pain, No N/V/D or constipation, No heartburn or reflux  GU: No  dysuria, frequency or urgency, or incontinence  MUSCULOSKELETAL: having phantom pain NEUROLOGIC: No headache, dizziness or focal weakness PSYCHIATRIC: +sadness, No behavior issue.   Filed Vitals:   11/10/14 2124  BP: 162/83  Pulse: 92  Temp: 98.4 F (36.9 C)  Resp: 19    Physical Exam  GENERAL APPEARANCE: Alert, conversant,  No acute distress.  SKIN: No diaphoresis rash HEAD: Normocephalic, atraumatic  EYES: Conjunctiva/lids clear. Pupils round, reactive. EOMs intact.  EARS: External exam WNL, canals clear. Hearing grossly normal.  NOSE: No deformity or discharge.  MOUTH/THROAT: Lips w/o lesions  RESPIRATORY: Breathing is even, unlabored. Lung sounds are clear   CARDIOVASCULAR: Heart RRR no murmurs, rubs or gallops. No peripheral edema.   GASTROINTESTINAL: Abdomen is soft, non-tender, not distended w/ normal bowel sounds. GENITOURINARY: Bladder non tender, not distended  MUSCULOSKELETAL: R BKA NEUROLOGIC:  Cranial nerves 2-12 grossly intact.L side weakness  PSYCHIATRIC: pressured speech, sadness, anger  Patient Active Problem List   Diagnosis Date Noted  . Depression 11/11/2014  . Gangrene of foot   . Hypokalemia   . Peripheral vascular disease 11/02/2014  . Gangrene 11/02/2014  . Wound, surgical, infected 11/01/2014  . Diabetic foot ulcer 11/01/2014  . Trochanteric bursitis of left hip 05/29/2014  . Spastic hemiplegia affecting nondominant side 12/02/2013  . Chronic diastolic heart failure, grade 2 10/26/2013  . Dyslipidemia 10/26/2013  . CVA (cerebral infarction) 10/25/2013  . Arthralgia of hip 10/25/2013  . ANEMIA, NORMOCYTIC 05/17/2009  . Diabetes mellitus type 2 with peripheral artery disease 05/08/2009  . ANXIETY 05/08/2009  . Essential hypertension 05/08/2009    CBC    Component Value Date/Time   WBC 11.6* 11/06/2014 0550   RBC 3.11* 11/06/2014 0550   HGB 9.9* 11/06/2014 0550   HCT 29.2* 11/06/2014 0550   PLT 377 11/06/2014 0550   MCV 93.9 11/06/2014  0550   LYMPHSABS 1.8 11/01/2014 2154   MONOABS 1.1* 11/01/2014 2154   EOSABS 0.1 11/01/2014 2154   BASOSABS 0.0 11/01/2014 2154    CMP     Component Value Date/Time   NA 142 11/06/2014 0550   K 3.6* 11/06/2014 0550   CL 106 11/06/2014 0550   CO2 24 11/06/2014 0550   GLUCOSE 135* 11/06/2014 0550   BUN 23 11/06/2014 0550   CREATININE 0.75 11/06/2014 0550   CALCIUM 8.0* 11/06/2014 0550   PROT 6.2 10/27/2013 0334   ALBUMIN 2.0* 11/01/2014 2154   AST 10 10/27/2013 0334   ALT 6 10/27/2013 0334   ALKPHOS  67 10/27/2013 0334   BILITOT 0.3 10/27/2013 0334   GFRNONAA 87* 11/06/2014 0550   GFRAA >90 11/06/2014 0550    Assessment and Plan  Gangrene of foot Likely secondary PVD and DM2. Seen by WOC who noted that the patent had gangrene. Plain film of right foot showed destruction of the right third toe middle and distal phalanges as well as distal aspect of proximal phalanx with soft tissue gas. Patient appeared somewhat toxic prior to surgery. WBC in the 20s. Started on Flagyl, Vanc, Rocephin. Successful BKA on 11/13, procedure performed by Dr. Darrick PennaFields. Patient tolerated procedure well. Surgical incision site remains clean. Blood cultures obtained on 11/01/2014 growing 1/2 coagulase negative staph, repeated blood cultures no growth to date. Discontinued ceftriaxone, start oral Keflex on 11/15. Will continue outpatient for 2 weeks. Per vascular staples to be removed in 1 month  Essential hypertension Chronic and likely exacerbated by pain. Her lisinopril was increased to 20 mg by mouth daily on 11/04/2014, 11/16 am BP reading 152/68. At the time, patient had not request pain medications and had just finished her PT evaluation. Will recheck prior to discharge  Diabetes mellitus type 2 with peripheral artery disease Chronic. Patient had blood sugars in the 300 range on 11/15, Lantus was increased to 20 units subcutaneous daily, meanwhile contining Accu-Cheks before every meal and daily at  bedtime with sliding scale coverage. CBG on 11/16 am of 120.    Peripheral vascular disease Secondary to uncontrolled DM, HTN, and age. Angiography performed by Dr. Jacinto HalimGanji 10/03/2014 revealed diffuse superficial femoral and tibial occlusive disease. No pedal pulses palpable bilaterally. Continued home medications  Chronic diastolic heart failure, grade 2 : Stable: Patient showed no signs of acute heart failure during hospital course. Echo in 2014 showed LV EF 55/60% and a grade 2 diastolic dysfunction. Continued home medications.   CVA (cerebral infarction) : CVA in early 2015 which resulted in some left-sided weakness. She currently ambulates with a walker. PT recommends CIR to help with her ambulation using her Left leg.   Dyslipidemia Continue lipitor 10 mg  Depression Being dx by me.Initially one might think she is sad about loss of limb but in a 5 minute conversation with her I have visited at least 5 unresolved griefs and a great deal of anger.Medication alone will not cover this, pt needs counseling.     [name], Mylia D, MD

## 2014-11-11 ENCOUNTER — Encounter: Payer: Self-pay | Admitting: Internal Medicine

## 2014-11-11 DIAGNOSIS — F32A Depression, unspecified: Secondary | ICD-10-CM | POA: Insufficient documentation

## 2014-11-11 DIAGNOSIS — F329 Major depressive disorder, single episode, unspecified: Secondary | ICD-10-CM | POA: Insufficient documentation

## 2014-11-11 NOTE — Assessment & Plan Note (Signed)
Chronic and likely exacerbated by pain. Her lisinopril was increased to 20 mg by mouth daily on 11/04/2014, 11/16 am BP reading 152/68. At the time, patient had not request pain medications and had just finished her PT evaluation. Will recheck prior to discharge

## 2014-11-11 NOTE — Assessment & Plan Note (Addendum)
Being dx by me.Initially one might think she is sad about loss of limb but in a 5 minute conversation with her I have visited at least 5 unresolved griefs and a great deal of anger.Medication alone will not cover this, pt needs counseling.

## 2014-11-11 NOTE — Assessment & Plan Note (Signed)
:   CVA in early 2015 which resulted in some left-sided weakness. She currently ambulates with a walker. PT recommends CIR to help with her ambulation using her Left leg.

## 2014-11-11 NOTE — Assessment & Plan Note (Signed)
Continue lipitor 10 mg

## 2014-11-11 NOTE — Assessment & Plan Note (Signed)
:   Stable: Patient showed no signs of acute heart failure during hospital course. Echo in 2014 showed LV EF 55/60% and a grade 2 diastolic dysfunction. Continued home medications.

## 2014-11-11 NOTE — Assessment & Plan Note (Signed)
Likely secondary PVD and DM2. Seen by WOC who noted that the patent had gangrene. Plain film of right foot showed destruction of the right third toe middle and distal phalanges as well as distal aspect of proximal phalanx with soft tissue gas. Patient appeared somewhat toxic prior to surgery. WBC in the 20s. Started on Flagyl, Vanc, Rocephin. Successful BKA on 11/13, procedure performed by Dr. Darrick PennaFields. Patient tolerated procedure well. Surgical incision site remains clean. Blood cultures obtained on 11/01/2014 growing 1/2 coagulase negative staph, repeated blood cultures no growth to date. Discontinued ceftriaxone, start oral Keflex on 11/15. Will continue outpatient for 2 weeks. Per vascular staples to be removed in 1 month

## 2014-11-11 NOTE — Assessment & Plan Note (Signed)
Secondary to uncontrolled DM, HTN, and age. Angiography performed by Dr. Jacinto HalimGanji 10/03/2014 revealed diffuse superficial femoral and tibial occlusive disease. No pedal pulses palpable bilaterally. Continued home medications

## 2014-11-11 NOTE — Assessment & Plan Note (Signed)
Chronic. Patient had blood sugars in the 300 range on 11/15, Lantus was increased to 20 units subcutaneous daily, meanwhile contining Accu-Cheks before every meal and daily at bedtime with sliding scale coverage. CBG on 11/16 am of 120.

## 2014-11-13 ENCOUNTER — Encounter: Payer: Medicare Other | Admitting: Surgery

## 2014-11-13 ENCOUNTER — Encounter (HOSPITAL_COMMUNITY): Payer: Medicare Other

## 2014-11-13 ENCOUNTER — Other Ambulatory Visit (HOSPITAL_COMMUNITY): Payer: Medicare Other

## 2014-11-21 ENCOUNTER — Non-Acute Institutional Stay (SKILLED_NURSING_FACILITY): Payer: Medicare Other | Admitting: Nurse Practitioner

## 2014-11-21 ENCOUNTER — Other Ambulatory Visit: Payer: Self-pay | Admitting: *Deleted

## 2014-11-21 DIAGNOSIS — F418 Other specified anxiety disorders: Secondary | ICD-10-CM

## 2014-11-21 DIAGNOSIS — K5900 Constipation, unspecified: Secondary | ICD-10-CM

## 2014-11-21 DIAGNOSIS — G546 Phantom limb syndrome with pain: Secondary | ICD-10-CM

## 2014-11-21 DIAGNOSIS — M62838 Other muscle spasm: Secondary | ICD-10-CM

## 2014-11-21 DIAGNOSIS — F419 Anxiety disorder, unspecified: Secondary | ICD-10-CM

## 2014-11-21 DIAGNOSIS — F329 Major depressive disorder, single episode, unspecified: Secondary | ICD-10-CM

## 2014-11-21 MED ORDER — HYDROCODONE-ACETAMINOPHEN 5-325 MG PO TABS: ORAL_TABLET | ORAL | Status: DC

## 2014-11-21 NOTE — Telephone Encounter (Signed)
[facility] 

## 2014-11-21 NOTE — Progress Notes (Signed)
Patient ID: Laurie [name], female   DOB: [birth year], 68 y.o.   MRN: [id number]    [facility]   Place of Service: SNF (31)  PCP: Lonia BloodGARBA,LAWAL, MD  Allergies  Allergen Reactions  . Penicillins Hives    Childhood allergy could have been more reaction only remembers hives.     Chief Complaint  Patient presents with  . Acute Visit    HPI:  Patient is a 68 y.o. female [facility] for multiple issues pt wanted to discuss. Pt reports she has chronic pain everywhere but recently with muscle spasms throughout left side that has been on-going. Dr Lyn HollingsheadAlexander called this morning and gave order for robaxin which was effective.   Review of Systems:  Review of Systems  Constitutional: Negative for activity change, appetite change, fatigue and unexpected weight change.  HENT: Negative for congestion and hearing loss.   Eyes: Negative.   Respiratory: Negative for cough and shortness of breath.   Cardiovascular: Negative for chest pain, palpitations and leg swelling.  Gastrointestinal: Positive for constipation. Negative for abdominal pain and diarrhea.  Genitourinary: Negative for dysuria and difficulty urinating.  Musculoskeletal: Positive for myalgias and arthralgias.       Reports phantom pain to right BKA, recent increase in gabapentin, hx of bursitis to left hip, pain in bilateral shoulders   Skin: Negative for color change and wound.  Neurological: Negative for dizziness and weakness.  Psychiatric/Behavioral: Positive for sleep disturbance and decreased concentration. Negative for behavioral problems, confusion and agitation. The patient is nervous/anxious.     Past Medical History  Diagnosis Date  . Hypertension   . Diabetes mellitus   . Chest pain     Troponins up to 0.12 while hospitalized for E.coli PNA  . 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 Bacteremia   . Anemia   . Narcolepsy   .  Stroke    Past Surgical History  Procedure Laterality Date  . No past surgeries    . Amputation Right 11/03/2014    Procedure: AMPUTATION BELOW KNEE;  Surgeon: Sherren Kernsharles E Fields, MD;  [facility];  Service: Vascular;  Laterality: Right;   Social History:   reports that she has been smoking Cigarettes.  She has a 9.75 pack-year smoking history. She has never used smokeless tobacco. She reports that she does not drink alcohol or use illicit drugs.  Family History  Problem Relation Age of Onset  . CAD Mother   . CAD Father     Medications: Patient's Medications  New Prescriptions   No medications on file  Previous Medications   ALPRAZOLAM (XANAX) 0.25 MG TABLET    Take one tablet by mouth twice daily as needed for anxiety   ASPIRIN EC 81 MG TABLET    Take 81 mg by mouth daily.   ATORVASTATIN (LIPITOR) 10 MG TABLET    Take 1 tablet (10 mg total) by mouth daily at 6 PM.   CHOLECALCIFEROL PO    Take 1 tablet by mouth daily.   CILOSTAZOL (PLETAL) 100 MG TABLET    Take 1 tablet (100 mg total) by mouth 2 (two) times daily.   CLOPIDOGREL (PLAVIX) 75 MG TABLET    Take 75 mg by mouth daily.   CO-ENZYME Q-10 30 MG CAPSULE    Take 100 mg by mouth daily.    GABAPENTIN (NEURONTIN) 300 MG CAPSULE    Take  1 capsule (300 mg total) by mouth 2 (two) times daily.   HYDROCHLOROTHIAZIDE (MICROZIDE) 12.5 MG CAPSULE    Take 1 capsule (12.5 mg total) by mouth daily.   HYDROCODONE-ACETAMINOPHEN (NORCO/VICODIN) 5-325 MG PER TABLET    Take one tablet by mouth every 6 hours as needed for pain   INSULIN GLARGINE (LANTUS) 100 UNIT/ML INJECTION    Inject 0.2 mLs (20 Units total) into the skin at bedtime.   LISINOPRIL (PRINIVIL,ZESTRIL) 20 MG TABLET    Take 1 tablet (20 mg total) by mouth daily.   MODAFINIL (PROVIGIL) 200 MG TABLET    Take one tablet by mouth twice daily for narcolepsy   OMEGA-3 FATTY ACIDS (ULTRA OMEGA 3 PO)    Take 1 capsule by mouth daily.  Modified Medications   No medications on file    Discontinued Medications   CEPHALEXIN (KEFLEX) 500 MG CAPSULE    Take 1 capsule (500 mg total) by mouth every 8 (eight) hours.     Physical Exam: Filed Vitals:   11/21/14 1459  BP: 121/69  Pulse: 80  Temp: 97.4 F (36.3 C)  Resp: 20    Physical Exam  Constitutional: She is oriented to person, place, and time. She appears well-developed and well-nourished. No distress.  HENT:  Head: Normocephalic and atraumatic.  Mouth/Throat: Oropharynx is clear and moist. No oropharyngeal exudate.  Eyes: Conjunctivae are normal. Pupils are equal, round, and reactive to light.  Neck: Normal range of motion. Neck supple.  Cardiovascular: Normal rate, regular rhythm and normal heart sounds.   Pulmonary/Chest: Effort normal and breath sounds normal.  Abdominal: Soft. Bowel sounds are normal.  Musculoskeletal: She exhibits no edema or tenderness.  Neurological: She is alert and oriented to person, place, and time.  Left sided weakness after CVA  Skin: Skin is warm and dry. She is not diaphoretic.  Psychiatric: Her mood appears anxious. Her affect is labile. Thought content is paranoid.    Labs reviewed: Basic Metabolic Panel:  Recent Labs  16/10/96 0420 11/04/14 0820 11/06/14 0550  NA 139 137 142  K 3.1* 4.6 3.6*  CL 102 101 106  CO2 23 22 24   GLUCOSE 178* 322* 135*  BUN 15 27* 23  CREATININE 0.91 0.95 0.75  CALCIUM 8.5 8.2* 8.0*   Liver Function Tests:  Recent Labs  11/01/14 2154  ALBUMIN 2.0*   No results for input(s): LIPASE, AMYLASE in the last 8760 hours. No results for input(s): AMMONIA in the last 8760 hours. CBC:  Recent Labs  11/01/14 1539 11/01/14 2154 11/03/14 0420 11/04/14 0820 11/06/14 0550  WBC 21.4* 18.8* 20.3* 20.8* 11.6*  NEUTROABS 18.6* 15.8*  --   --   --   HGB 9.8* 9.2* 9.1* 9.7* 9.9*  HCT 29.7* 27.1* 26.3* 28.9* 29.2*  MCV 93.1 91.6 90.1 91.7 93.9  PLT 338 315 341 370 377   TSH: No results for input(s): TSH in the last 8760  hours. A1C: Lab Results  Component Value Date   HGBA1C 7.9* 11/01/2014   Lipid Panel: No results for input(s): CHOL, HDL, LDLCALC, TRIG, CHOLHDL, LDLDIRECT in the last 8760 hours.  Assessment/Plan 1. Anxiety and depression Cries frequently during exam about how she does not want to be a burden. Does not have a good support system with family. Her brother died who she was close to in 07-01-2023 Will start on Cymbalta 30 mg daily for 1 week then to increase to 60 mg daily for anxiety and depression as well as chronic pains from  CVA and neuropathies   2. Muscle spasm -reports muscle spasms to left side of body. Robaxin was ordered which was effective, to cont robaxin every 6 hours and to hold for sedation  3. 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   4. Constipation Will add colace 100 mg po twice daily To increase activity and fluid intake

## 2014-11-23 ENCOUNTER — Non-Acute Institutional Stay (SKILLED_NURSING_FACILITY): Payer: Medicare Other | Admitting: Internal Medicine

## 2014-11-23 ENCOUNTER — Encounter: Payer: Self-pay | Admitting: Internal Medicine

## 2014-11-23 DIAGNOSIS — I10 Essential (primary) hypertension: Secondary | ICD-10-CM

## 2014-11-23 DIAGNOSIS — F411 Generalized anxiety disorder: Secondary | ICD-10-CM

## 2014-11-23 DIAGNOSIS — I5032 Chronic diastolic (congestive) heart failure: Secondary | ICD-10-CM

## 2014-11-23 DIAGNOSIS — F329 Major depressive disorder, single episode, unspecified: Secondary | ICD-10-CM

## 2014-11-23 DIAGNOSIS — F32A Depression, unspecified: Secondary | ICD-10-CM

## 2014-11-23 DIAGNOSIS — E785 Hyperlipidemia, unspecified: Secondary | ICD-10-CM

## 2014-11-23 DIAGNOSIS — I6350 Cerebral infarction due to unspecified occlusion or stenosis of unspecified cerebral artery: Secondary | ICD-10-CM

## 2014-11-23 DIAGNOSIS — Z89511 Acquired absence of right leg below knee: Secondary | ICD-10-CM

## 2014-11-23 DIAGNOSIS — E1159 Type 2 diabetes mellitus with other circulatory complications: Secondary | ICD-10-CM

## 2014-11-23 DIAGNOSIS — G8110 Spastic hemiplegia affecting unspecified side: Secondary | ICD-10-CM

## 2014-11-23 DIAGNOSIS — Z89519 Acquired absence of unspecified leg below knee: Secondary | ICD-10-CM | POA: Insufficient documentation

## 2014-11-23 DIAGNOSIS — E1151 Type 2 diabetes mellitus with diabetic peripheral angiopathy without gangrene: Secondary | ICD-10-CM

## 2014-11-23 DIAGNOSIS — I739 Peripheral vascular disease, unspecified: Secondary | ICD-10-CM

## 2014-11-23 NOTE — Progress Notes (Signed)
MRN: [id number] Name: Laurie [name]  Sex: female Age: 68 y.o. DOB: May 24, 1946  [name] Facility[facility]: 303 Level Of Care: SNF Provider: Merrilee SeashoreALEXANDER, [name] Emergency Contacts: Extended Emergency Contact Information Primary Emergency Contact: [name],[name]          [place], [place] [id number] [place] Home Phone: [phone] Relation: Daughter Secondary Emergency Contact: Hulen SkainsWilliamson,Velma  [place] Mobile Phone: [phone] Relation: Sister  Code Status: FULL  Allergies: Penicillins  Chief Complaint  Patient presents with  . Discharge Note    HPI: Patient is 68 y.o. female who was admitted to SNF for OT/PT s/p R BKA and is stable to be dischaged to home.  Past Medical History  Diagnosis Date  . Hypertension   . Diabetes mellitus   . Chest pain     Troponins up to 0.12 while hospitalized for E.coli PNA  . 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 Bacteremia   . Anemia   . Narcolepsy   . Stroke     Past Surgical History  Procedure Laterality Date  . No past surgeries    . Amputation Right 11/03/2014    Procedure: AMPUTATION BELOW KNEE;  Surgeon: Sherren Kernsharles E Fields, MD;  [facility];  Service: Vascular;  Laterality: Right;      Medication List       This list is accurate as of: 11/23/14  2:44 PM.  Always use your most recent med list.               ALPRAZolam 0.25 MG tablet  Commonly known as:  XANAX  Take one tablet by mouth twice daily as needed for anxiety     aspirin EC 81 MG tablet  Take 81 mg by mouth daily.     atorvastatin 10 MG tablet  Commonly known as:  LIPITOR  Take 1 tablet (10 mg total) by mouth daily at 6 PM.     calcium-vitamin D 500-200 MG-UNIT per tablet  Commonly known as:  OSCAL WITH D  Take 1 tablet by mouth daily with breakfast.     cilostazol 100 MG tablet  Commonly known as:  PLETAL  Take 1 tablet (100 mg total) by mouth 2 (two) times daily.     clopidogrel 75 MG  tablet  Commonly known as:  PLAVIX  Take 75 mg by mouth daily.     co-enzyme Q-10 30 MG capsule  Take 100 mg by mouth daily.     DULoxetine 30 MG capsule  Commonly known as:  CYMBALTA  Take 30 mg by mouth daily.     gabapentin 300 MG capsule  Commonly known as:  NEURONTIN  Take 1 capsule (300 mg total) by mouth 2 (two) times daily.     hydrochlorothiazide 12.5 MG capsule  Commonly known as:  MICROZIDE  Take 1 capsule (12.5 mg total) by mouth daily.     HYDROcodone-acetaminophen 5-325 MG per tablet  Commonly known as:  NORCO/VICODIN  Take one tablet by mouth every 6 hours as needed for pain     insulin glargine 100 UNIT/ML injection  Commonly known as:  LANTUS  Inject 0.2 mLs (20 Units total) into the skin at bedtime.     lisinopril 20 MG tablet  Commonly known as:  PRINIVIL,ZESTRIL  Take 1 tablet (20 mg total) by mouth daily.     Melatonin 10 MG Caps  Take 1 tablet by mouth at bedtime.  modafinil 200 MG tablet  Commonly known as:  PROVIGIL  Take one tablet by mouth twice daily for narcolepsy     ULTRA OMEGA 3 PO  Take 1 capsule by mouth daily.        Meds ordered this encounter  Medications  . DULoxetine (CYMBALTA) 30 MG capsule    Sig: Take 30 mg by mouth daily.  . calcium-vitamin D (OSCAL WITH D) 500-200 MG-UNIT per tablet    Sig: Take 1 tablet by mouth daily with breakfast.  . Melatonin 10 MG CAPS    Sig: Take 1 tablet by mouth at bedtime.    Immunization History  Administered Date(s) Administered  . Influenza,inj,Quad PF,36+ Mos 10/26/2013, 11/01/2014  . Pneumococcal Polysaccharide-23 12/22/2005, 10/28/2013    History  Substance Use Topics  . Smoking status: Current Every Day Smoker -- 0.25 packs/day for 39 years    Types: Cigarettes  . Smokeless tobacco: Never Used  . Alcohol Use: No    There were no vitals filed for this visit.  Physical Exam  GENERAL APPEARANCE: Alert, conversant  HEENT: Unremarkable. RESPIRATORY: Breathing is even,  unlabored. Lung sounds are clear   CARDIOVASCULAR: Heart RRR no murmurs, rubs or gallops. No peripheral edema.  GASTROINTESTINAL: Abdomen is soft, non-tender, not distended w/ normal bowel sounds.  NEUROLOGIC: Cranial nerves 2-12 grossly intact; L side weakness  Patient Active Problem List   Diagnosis Date Noted  . S/P BKA (below knee amputation) 11/23/2014  . Depression 11/11/2014  . Gangrene of foot   . Hypokalemia   . Peripheral vascular disease 11/02/2014  . Gangrene 11/02/2014  . Wound, surgical, infected 11/01/2014  . Diabetic foot ulcer 11/01/2014  . Trochanteric bursitis of left hip 05/29/2014  . Spastic hemiplegia affecting nondominant side 12/02/2013  . Chronic diastolic heart failure, grade 2 10/26/2013  . Dyslipidemia 10/26/2013  . CVA (cerebral infarction) 10/25/2013  . Arthralgia of hip 10/25/2013  . Anemia 05/17/2009  . Diabetes mellitus type 2 with peripheral artery disease 05/08/2009  . Anxiety state 05/08/2009  . Essential hypertension 05/08/2009    CBC    Component Value Date/Time   WBC 11.6* 11/06/2014 0550   RBC 3.11* 11/06/2014 0550   HGB 9.9* 11/06/2014 0550   HCT 29.2* 11/06/2014 0550   PLT 377 11/06/2014 0550   MCV 93.9 11/06/2014 0550   LYMPHSABS 1.8 11/01/2014 2154   MONOABS 1.1* 11/01/2014 2154   EOSABS 0.1 11/01/2014 2154   BASOSABS 0.0 11/01/2014 2154    CMP     Component Value Date/Time   NA 142 11/06/2014 0550   K 3.6* 11/06/2014 0550   CL 106 11/06/2014 0550   CO2 24 11/06/2014 0550   GLUCOSE 135* 11/06/2014 0550   BUN 23 11/06/2014 0550   CREATININE 0.75 11/06/2014 0550   CALCIUM 8.0* 11/06/2014 0550   PROT 6.2 10/27/2013 0334   ALBUMIN 2.0* 11/01/2014 2154   AST 10 10/27/2013 0334   ALT 6 10/27/2013 0334   ALKPHOS 67 10/27/2013 0334   BILITOT 0.3 10/27/2013 0334   GFRNONAA 87* 11/06/2014 0550   GFRAA >90 11/06/2014 0550    Assessment and Plan  Pt will be d/c to home with HH/OT/PT/nursing. DME needed: WC, rolling  walker, 3:1 BSC, tub bench.  [name], [name], MD

## 2014-11-29 ENCOUNTER — Encounter: Payer: Self-pay | Admitting: Vascular Surgery

## 2014-11-30 ENCOUNTER — Ambulatory Visit (INDEPENDENT_AMBULATORY_CARE_PROVIDER_SITE_OTHER): Payer: Medicare Other | Admitting: Vascular Surgery

## 2014-11-30 ENCOUNTER — Encounter: Payer: Self-pay | Admitting: Vascular Surgery

## 2014-11-30 VITALS — BP 151/67 | HR 94 | Ht 61.0 in | Wt 160.0 lb

## 2014-11-30 DIAGNOSIS — I739 Peripheral vascular disease, unspecified: Secondary | ICD-10-CM

## 2014-11-30 NOTE — Progress Notes (Signed)
Patient is a 68 year old female who returns for follow-up today. She had a right below-knee amputation performed on 11/03/2014. She denies any drainage from the incision. She is currently living at a skilled nursing facility. She also complains today of pinpoint left hip pain which can occur while she is sitting or bearing weight on the leg. She states she has had an injection of this in the past. It did not help. She does have known peripheral arterial disease in the left lower extremity but does not really describe rest pain in the foot or claudication symptoms.  Filed Vitals:   11/30/14 1049  BP: 151/67  Pulse: 94  Height: 5\' 1"  (1.549 m)  Weight: 160 lb (72.576 kg)  SpO2: 99%    Extremities: Right below-knee amputation well-healed no drainage all staples removed today Left lower extremity asymptomatic no ulcers  Assessment: Asymptomatic left lower extremity peripheral arterial disease. Well-healed right below-knee amputation.  Plan: Follow-up 1 year left leg ABI        #2 patient given a shrinker today for her below-knee amputation stump and [facility]  #3 we will schedule her an appointment with Dr. Aldean BakerMarcus Duda for evaluation of her left hip pain.  Fabienne Brunsharles Fields, MD [facility] Office: [phone] Pager: [phone]

## 2014-12-01 ENCOUNTER — Other Ambulatory Visit: Payer: Self-pay | Admitting: *Deleted

## 2014-12-01 DIAGNOSIS — M25552 Pain in left hip: Secondary | ICD-10-CM

## 2014-12-01 DIAGNOSIS — I739 Peripheral vascular disease, unspecified: Secondary | ICD-10-CM

## 2014-12-08 ENCOUNTER — Other Ambulatory Visit: Payer: Self-pay | Admitting: *Deleted

## 2014-12-08 MED ORDER — MODAFINIL 200 MG PO TABS: ORAL_TABLET | ORAL | Status: DC

## 2014-12-08 NOTE — Telephone Encounter (Signed)
[facility] 

## 2015-01-05 ENCOUNTER — Non-Acute Institutional Stay (SKILLED_NURSING_FACILITY): Payer: Medicare Other | Admitting: Nurse Practitioner

## 2015-01-05 DIAGNOSIS — M25552 Pain in left hip: Secondary | ICD-10-CM

## 2015-01-05 DIAGNOSIS — F411 Generalized anxiety disorder: Secondary | ICD-10-CM

## 2015-01-05 DIAGNOSIS — K5900 Constipation, unspecified: Secondary | ICD-10-CM

## 2015-01-05 DIAGNOSIS — Z89511 Acquired absence of right leg below knee: Secondary | ICD-10-CM

## 2015-01-05 DIAGNOSIS — G8110 Spastic hemiplegia affecting unspecified side: Secondary | ICD-10-CM

## 2015-01-05 DIAGNOSIS — I10 Essential (primary) hypertension: Secondary | ICD-10-CM

## 2015-01-05 DIAGNOSIS — I5032 Chronic diastolic (congestive) heart failure: Secondary | ICD-10-CM

## 2015-01-05 DIAGNOSIS — I739 Peripheral vascular disease, unspecified: Secondary | ICD-10-CM

## 2015-01-05 DIAGNOSIS — D649 Anemia, unspecified: Secondary | ICD-10-CM

## 2015-01-05 DIAGNOSIS — E1159 Type 2 diabetes mellitus with other circulatory complications: Secondary | ICD-10-CM

## 2015-01-05 DIAGNOSIS — E1151 Type 2 diabetes mellitus with diabetic peripheral angiopathy without gangrene: Secondary | ICD-10-CM

## 2015-01-05 NOTE — Progress Notes (Signed)
Patient ID: Laurie [name], female   DOB: December 07, 1946, 69 y.o.   MRN: [id number]    Nursing Home Location:  [facility]   Place of Service: SNF (31)  PCP: Lonia Blood, MD  Allergies  Allergen Reactions  . Penicillins Hives    Childhood allergy could have been more reaction only remembers hives.     Chief Complaint  Patient presents with  . Medical Management of Chronic Issues    HPI:  Patient is a 69 y.o. female seen today at [facility] for routine follow up. Pt with a pmh of CHF, HTN, PVD, CVA, hyperlipidemia, anxiety, depression, anemia, BKA. Pt reports worsening of narcolepsy since increase in her pain medication, however still complaining of pain. Pt reports worsening left hip pain. Reports hx of bursitis. Overall mood has been better. Currently staying at [facility] for extended stay to help regain strength.  conts to have constipation.  Review of Systems:  Review of Systems  Constitutional: Negative for activity change, appetite change, fatigue and unexpected weight change.  HENT: Negative for congestion and hearing loss.   Eyes: Negative.   Respiratory: Negative for cough and shortness of breath.   Cardiovascular: Negative for chest pain, palpitations and leg swelling.  Gastrointestinal: Positive for constipation. Negative for abdominal pain and diarrhea.  Genitourinary: Negative for dysuria and difficulty urinating.  Musculoskeletal: Positive for myalgias and arthralgias.       Reports phantom pain to right BKA, recent increase in gabapentin, hx of bursitis to left hip, pain in bilateral shoulders   Skin: Negative for color change and wound.  Neurological: Negative for dizziness and weakness.  Psychiatric/Behavioral: Positive for sleep disturbance and decreased concentration. Negative for behavioral problems, confusion and agitation. The patient is nervous/anxious.     Past Medical History  Diagnosis Date  . Hypertension   . Diabetes  mellitus   . Chest pain     Troponins up to 0.12 while hospitalized for E.coli PNA  . 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 Bacteremia   . Anemia   . Narcolepsy   . Stroke    Past Surgical History  Procedure Laterality Date  . No past surgeries    . Amputation Right 11/03/2014    Procedure: AMPUTATION BELOW KNEE;  Surgeon: Sherren Kerns, MD;  Location: [facility] OR;  Service: Vascular;  Laterality: Right;  . Lower extremity angiogram N/A 10/03/2014    Procedure: LOWER EXTREMITY ANGIOGRAM;  Surgeon: Pamella Pert, MD;  Location: [facility] CATH LAB;  Service: Cardiovascular;  Laterality: N/A;   Social History:   reports that she has been smoking Cigarettes.  She has a 9.75 pack-year smoking history. She has never used smokeless tobacco. She reports that she does not drink alcohol or use illicit drugs.  Family History  Problem Relation Age of Onset  . CAD Mother   . CAD Father     Medications: Patient's Medications  New Prescriptions   No medications on file  Previous Medications   ALPRAZOLAM (XANAX) 0.25 MG TABLET    Take one tablet by mouth twice daily as needed for anxiety   ASPIRIN EC 81 MG TABLET    Take 81 mg by mouth daily.   ATORVASTATIN (LIPITOR) 10 MG TABLET    Take 1 tablet (10 mg total) by mouth daily at 6 PM.   CALCIUM-VITAMIN D (OSCAL WITH D) 500-200 MG-UNIT PER TABLET    Take 1 tablet by mouth daily  with breakfast.   CILOSTAZOL (PLETAL) 100 MG TABLET    Take 1 tablet (100 mg total) by mouth 2 (two) times daily.   CLOPIDOGREL (PLAVIX) 75 MG TABLET    Take 75 mg by mouth daily.   CO-ENZYME Q-10 30 MG CAPSULE    Take 100 mg by mouth daily.    DOCUSATE SODIUM (COLACE) 100 MG CAPSULE    Take 100 mg by mouth 2 (two) times daily.   DULOXETINE (CYMBALTA) 60 MG CAPSULE    Take 60 mg by mouth daily.   GABAPENTIN (NEURONTIN) 300 MG CAPSULE    Take 1 capsule (300 mg total) by mouth 2 (two) times daily.   HYDROCHLOROTHIAZIDE (MICROZIDE) 12.5 MG CAPSULE    Take 1  capsule (12.5 mg total) by mouth daily.   HYDROCODONE-ACETAMINOPHEN (NORCO/VICODIN) 5-325 MG PER TABLET    Take one tablet by mouth every 6 hours as needed for pain   INSULIN GLARGINE (LANTUS) 100 UNIT/ML INJECTION    Inject 0.2 mLs (20 Units total) into the skin at bedtime.   LISINOPRIL (PRINIVIL,ZESTRIL) 20 MG TABLET    Take 1 tablet (20 mg total) by mouth daily.   MELATONIN 10 MG CAPS    Take 1 tablet by mouth at bedtime.   METHOCARBAMOL (ROBAXIN) 500 MG TABLET    Take 500 mg by mouth 4 (four) times daily.   OMEGA-3 FATTY ACIDS (ULTRA OMEGA 3 PO)    Take 1 capsule by mouth daily.  Modified Medications   Modified Medication Previous Medication   MODAFINIL (PROVIGIL) 200 MG TABLET modafinil (PROVIGIL) 200 MG tablet      Take one tablet by mouth twice daily for narcolepsy    Take one tablet by mouth twice daily for narcolepsy  Discontinued Medications   DULOXETINE (CYMBALTA) 30 MG CAPSULE    Take 30 mg by mouth daily.     Physical Exam: Filed Vitals:   01/05/15 1609  BP: 146/79  Pulse: 78  Temp: 98 F (36.7 C)  Resp: 18  Weight: 165 lb (74.844 kg)    Physical Exam  Constitutional: She is oriented to person, place, and time. She appears well-developed and well-nourished. No distress.  HENT:  Head: Normocephalic and atraumatic.  Mouth/Throat: Oropharynx is clear and moist. No oropharyngeal exudate.  Eyes: Conjunctivae are normal. Pupils are equal, round, and reactive to light.  Neck: Normal range of motion. Neck supple.  Cardiovascular: Normal rate, regular rhythm and normal heart sounds.   Pulmonary/Chest: Effort normal and breath sounds normal.  Abdominal: Soft. Bowel sounds are normal.  Musculoskeletal: She exhibits tenderness. She exhibits no edema.  Tenderness to left hip  Neurological: She is alert and oriented to person, place, and time.  Left sided weakness after CVA  Skin: Skin is warm and dry. She is not diaphoretic.  Psychiatric: Her mood appears anxious. Her affect  is labile. Thought content is paranoid.    Labs reviewed: Basic Metabolic Panel:  Recent Labs  16/10/96 0420 11/04/14 0820 11/06/14 0550  NA 139 137 142  K 3.1* 4.6 3.6*  CL 102 101 106  CO2 GLUCOSE 178* 322* 135*  BUN 15 27* 23  CREATININE 0.91 0.95 0.75  CALCIUM 8.5 8.2* 8.0*   Liver Function Tests:  Recent Labs  11/01/14 2154  ALBUMIN 2.0*   No results for input(s): LIPASE, AMYLASE in the last 8760 hours. No results for input(s): AMMONIA in the last 8760 hours. CBC:  Recent Labs  11/01/14 1539 11/01/14 2154 11/03/14  [id number] 11/04/14 0820 11/06/14 0550  WBC 21.4* 18.8* 20.3* 20.8* 11.6*  NEUTROABS 18.6* 15.8*  --   --   --   HGB 9.8* 9.2* 9.1* 9.7* 9.9*  HCT 29.7* 27.1* 26.3* 28.9* 29.2*  MCV 93.1 91.6 90.1 91.7 93.9  PLT 338 315 341 370 377   TSH: No results for input(s): TSH in the last 8760 hours. A1C: Lab Results  Component Value Date   HGBA1C 7.9* 11/01/2014   Lipid Panel: No results for input(s): CHOL, HDL, LDLCALC, TRIG, CHOLHDL, LDLDIRECT in the last 8760 hours.  Assessment/Plan 1. Chronic diastolic heart failure, grade 2 Stable: Patient showed no signs of acute heart failure. Echo in 2014 showed LV EF 55/60% and a grade 2 diastolic dysfunction.   2. Peripheral vascular disease conts on ASA and pletal, following with vascular  3. Diabetes mellitus type 2 with peripheral artery disease Will get CBGs q am and get A1c conts on lantus   4. Essential hypertension Not at goal today, will have staff take blood pressure daily  5. Spastic hemiplegia affecting nondominant side CVA in 2015, conts on robaxin   6. Arthralgia of hip, left As well as Hx of bursitis, will order aleve 220 mg PO BID for 1 week   7. Status post below knee amputation of right lower extremity -conts to work with therapy   8. Anemia, unspecified anemia type Will follow up CBC  9. Anxiety state Has improved, conts on cymbalta 60 mg daily   10.  Constipation Ongoing will change colace to senna S PO BID

## 2015-01-09 ENCOUNTER — Other Ambulatory Visit: Payer: Self-pay | Admitting: *Deleted

## 2015-01-09 MED ORDER — MODAFINIL 200 MG PO TABS: ORAL_TABLET | ORAL | Status: DC

## 2015-01-09 NOTE — Telephone Encounter (Signed)
[facility] 

## 2015-01-30 ENCOUNTER — Ambulatory Visit (INDEPENDENT_AMBULATORY_CARE_PROVIDER_SITE_OTHER): Payer: Medicare Other | Admitting: Podiatry

## 2015-01-30 ENCOUNTER — Encounter: Payer: Self-pay | Admitting: Podiatry

## 2015-01-30 VITALS — BP 180/91 | HR 97 | Resp 18

## 2015-01-30 DIAGNOSIS — L89891 Pressure ulcer of other site, stage 1: Secondary | ICD-10-CM | POA: Diagnosis not present

## 2015-01-30 DIAGNOSIS — L97521 Non-pressure chronic ulcer of other part of left foot limited to breakdown of skin: Secondary | ICD-10-CM

## 2015-01-30 MED ORDER — CEPHALEXIN 500 MG PO CAPS: 500.0000 mg | ORAL_CAPSULE | Freq: Three times a day (TID) | ORAL | Status: DC

## 2015-01-30 MED ORDER — SILVER SULFADIAZINE 1 % EX CREA: 1.0000 "application " | TOPICAL_CREAM | Freq: Every day | CUTANEOUS | Status: DC

## 2015-01-30 NOTE — Progress Notes (Signed)
   Subjective:    Patient ID: Laurie [name], female    DOB: 12/18/1946, 69 y.o.   MRN: [id number]  HPI  [place] female presents the office today with complaints of blisters on the top of her toes on the left foot which is been ongoing for approximate 2 weeks. She states that the blisters started after wearing a new parachute shoes on the left foot and she believes that they were rubbing. She recently underwent a below-knee amputation on the right side in January 2016. She denies any redness around the blister sites on the left foot. She denies any systemic complaints such as fevers, chills, nausea, vomiting. No other complaints at this time.    Review of Systems  Musculoskeletal: Positive for gait problem.       JOINT AND MUSCLE PAIN  Hematological: Bruises/bleeds easily.       SLOW TO HEAL  All other systems reviewed and are negative.      Objective:   Physical Exam AAO 3, NAD Right below-knee amputation DP/PT pulses decrease in the left Protective sensation decreased with Dorann OuSimms Weinstein monitor,, decreased vibratory sensation, Achilles tendon reflex intact. On the dorsal aspect of the left second, third digits there is evidence of partially deroofed blisters with granular wound base. On the fourth digit there is 2 large tense bulla without any surrounding erythema. There is no ascending cellulitis. There is no malodor. No edema to the left foot. No interdigital maceration. No other open lesions or pre-ulcer lesions identified. No other areas of tenderness bilateral lower extremities No pain with calf compression, swelling, warmth, erythema.       Assessment & Plan:  69 year old female with history of recent right below-knee amputation with new wounds to the left toes. -Due to the large tense bulla on the fourth digit they were drained and which serous fluid was obtained. Currently, there is no clinical signs of infection. -Silvadene was applied followed by dry sterile  dressing. Recommend the patient to continue this on a daily basis. A prescription for Silvadene was sent to the patients pharmacy. -Will start Keflex. -Follow-up in the wound care center as she is known to them and given her history of recent amputation. If she is unable to follow-up in the wound care center, to follow-up with me in 2 week.  -Monitor for signs or symptoms of infection and directed to call the office immediately should any occur or go directly to the emergency room.

## 2015-01-30 NOTE — Patient Instructions (Signed)
Continue dressing changes daily. Follow-up with wound care center Monitor for any signs/symptoms of infection. Call the office immediately if any occur or go directly to the emergency room. Call with any questions/concerns.

## 2015-02-08 ENCOUNTER — Encounter (HOSPITAL_BASED_OUTPATIENT_CLINIC_OR_DEPARTMENT_OTHER): Payer: Medicare Other | Attending: Internal Medicine

## 2015-02-08 DIAGNOSIS — E11621 Type 2 diabetes mellitus with foot ulcer: Secondary | ICD-10-CM | POA: Insufficient documentation

## 2015-02-08 DIAGNOSIS — D649 Anemia, unspecified: Secondary | ICD-10-CM | POA: Diagnosis not present

## 2015-02-08 DIAGNOSIS — L97522 Non-pressure chronic ulcer of other part of left foot with fat layer exposed: Secondary | ICD-10-CM | POA: Diagnosis not present

## 2015-02-08 DIAGNOSIS — Z794 Long term (current) use of insulin: Secondary | ICD-10-CM | POA: Insufficient documentation

## 2015-02-08 DIAGNOSIS — Z8673 Personal history of transient ischemic attack (TIA), and cerebral infarction without residual deficits: Secondary | ICD-10-CM | POA: Diagnosis not present

## 2015-02-08 DIAGNOSIS — E1151 Type 2 diabetes mellitus with diabetic peripheral angiopathy without gangrene: Secondary | ICD-10-CM | POA: Insufficient documentation

## 2015-02-09 NOTE — Progress Notes (Signed)
[facility]  NAME:  Laurie [name], Laurie [name]                ACCOUNT NO.:  [id number]  MEDICAL RECORD NO.:  [id number]      DATE OF BIRTH:  1946/07/14  PHYSICIAN:  Maxwell CaulMichael G. Laurie [name], M.D. VISIT DATE:  02/08/2015                                  OFFICE VISIT   This is a 69 year old patient we know from a short stay here in late October to November 2015.  At that point, she had a wound on her right third toe and went on to develop cellulitis and gangrene of the right foot and ended up shortly after this with right below-knee amputation. She tells me that she recently obtained a prosthesis on the right and started to walk.  She thinks she rubbed the dorsal surface of her second, third and fourth toes on the left and indeed she developed blisters.  She was recently seen by Laurie SineMatthew [name], DPM and had excision of blisters on her second and third toes and she has been referred here.  Reviewing her history shows an extensive history of PAD.  She has had an arteriogram on October 02, 2014 which showed a left superficial femoral artery to be severely diseased and occluded, below the knee peroneal artery and posterior tibial arteries are collateralized by the profunda femoral artery and the distal end of a severely diseased occluded SFA. Previous left ABI was 0.46.  Today, in this clinic calculated at 0.39. The patient states she has not had significant pain, fever, or systemic symptoms.  PAST MEDICAL HISTORY: 1. Type 2 diabetes with known PAD. 2. Hypertension. 3. Chest pain. 4. Narcolepsy. 5. Anemia. 6. History of a pontine CVA.  SURGICAL HISTORY:  Right BKA, lower extremity angiogram on October 2013 as quoted above.  CURRENT MEDICATIONS: 1. Xanax 0.25 b.i.d. p.r.n. 2. Aspirin 81 daily. 3. Atorvastatin 10 daily. 4. Os-Cal 500/200 one tablet daily. 5. Plavix 75 daily. 6. Cymbalta 60 daily. 7. Colace 100 b.i.d. 8. Neurontin 300 b.i.d. 9. Hydrochlorothiazide 12.5  daily. 10.Lantus 20 units at bedtime. 11.Lisinopril 20 daily. 12.Melatonin 10 mg at bedtime. 13.Modafinil 200 b.i.d. 14.Robaxin 500 four times a day.  PHYSICAL EXAMINATION:  GENERAL:  Temperature is 98.5, pulse 84, respirations 18, blood pressure 153/56.  As mentioned above, ABI calculated in this clinic at 0.39.  WOUND EXAM:  The areas in question over the left second, third and fourth toes.  All of these are fairly substantial wound areas.  The second and third toe covered with adherent eschar that was manually debrided.  The left fourth toe still had reminiscent of a blister that had ruptured, this was not debrided.  IMPRESSION/PLAN:  Type 2 diabetes, with [name] grade 2 wounds in the setting of severe peripheral artery disease.  Wounds on the second and third toe were debrided, dressed with Santyl, the fourth toe was wrapped with gauze.  She was put in a healing sandal.  I have sent her back to see Dr. Jacinto HalimGanji to have him review her status with regards to any percutaneous interventions.  Failing this, she may need to go back to see Vascular Surgery if it is felt that there is anything that can be done here.  This would have to be judged a limb-threatening situation given her recent amputation and multiple diabetic complications.  We will see her again next week.          ______________________________ Maxwell Caul, M.D.     MGR/MEDQ  D:  02/08/2015  T:  02/09/2015  Job:  [id number]

## 2015-02-14 ENCOUNTER — Ambulatory Visit: Payer: Medicare Other | Admitting: Podiatry

## 2015-02-15 DIAGNOSIS — Z794 Long term (current) use of insulin: Secondary | ICD-10-CM | POA: Diagnosis not present

## 2015-02-15 DIAGNOSIS — E11621 Type 2 diabetes mellitus with foot ulcer: Secondary | ICD-10-CM | POA: Diagnosis not present

## 2015-02-15 DIAGNOSIS — L97522 Non-pressure chronic ulcer of other part of left foot with fat layer exposed: Secondary | ICD-10-CM | POA: Diagnosis not present

## 2015-02-15 DIAGNOSIS — E1151 Type 2 diabetes mellitus with diabetic peripheral angiopathy without gangrene: Secondary | ICD-10-CM | POA: Diagnosis not present

## 2015-02-15 LAB — GLUCOSE, CAPILLARY: Glucose-Capillary: 198 mg/dL — ABNORMAL HIGH (ref 70–99)

## 2015-02-22 ENCOUNTER — Encounter (HOSPITAL_BASED_OUTPATIENT_CLINIC_OR_DEPARTMENT_OTHER): Payer: Medicare Other | Attending: Internal Medicine

## 2015-02-22 DIAGNOSIS — E11621 Type 2 diabetes mellitus with foot ulcer: Secondary | ICD-10-CM | POA: Diagnosis present

## 2015-02-22 DIAGNOSIS — Z89511 Acquired absence of right leg below knee: Secondary | ICD-10-CM | POA: Insufficient documentation

## 2015-02-22 DIAGNOSIS — L97529 Non-pressure chronic ulcer of other part of left foot with unspecified severity: Secondary | ICD-10-CM | POA: Diagnosis present

## 2015-02-22 DIAGNOSIS — L97521 Non-pressure chronic ulcer of other part of left foot limited to breakdown of skin: Secondary | ICD-10-CM | POA: Diagnosis not present

## 2015-02-22 DIAGNOSIS — Z794 Long term (current) use of insulin: Secondary | ICD-10-CM | POA: Insufficient documentation

## 2015-02-22 DIAGNOSIS — E1151 Type 2 diabetes mellitus with diabetic peripheral angiopathy without gangrene: Secondary | ICD-10-CM | POA: Insufficient documentation

## 2015-02-22 LAB — GLUCOSE, CAPILLARY: Glucose-Capillary: 150 mg/dL — ABNORMAL HIGH (ref 70–99)

## 2015-02-23 ENCOUNTER — Emergency Department (HOSPITAL_COMMUNITY)
Admission: EM | Admit: 2015-02-23 | Discharge: 2015-02-24 | Disposition: A | Discharge status: Home / Self Care | Payer: Medicare Other | Attending: Emergency Medicine | Admitting: Emergency Medicine

## 2015-02-23 ENCOUNTER — Encounter (HOSPITAL_COMMUNITY): Payer: Self-pay | Admitting: Emergency Medicine

## 2015-02-23 DIAGNOSIS — E119 Type 2 diabetes mellitus without complications: Secondary | ICD-10-CM | POA: Diagnosis not present

## 2015-02-23 DIAGNOSIS — Z8619 Personal history of other infectious and parasitic diseases: Secondary | ICD-10-CM | POA: Diagnosis not present

## 2015-02-23 DIAGNOSIS — Z794 Long term (current) use of insulin: Secondary | ICD-10-CM | POA: Diagnosis not present

## 2015-02-23 DIAGNOSIS — L97521 Non-pressure chronic ulcer of other part of left foot limited to breakdown of skin: Secondary | ICD-10-CM | POA: Insufficient documentation

## 2015-02-23 DIAGNOSIS — G47419 Narcolepsy without cataplexy: Secondary | ICD-10-CM | POA: Insufficient documentation

## 2015-02-23 DIAGNOSIS — Z79899 Other long term (current) drug therapy: Secondary | ICD-10-CM | POA: Diagnosis not present

## 2015-02-23 DIAGNOSIS — Z8673 Personal history of transient ischemic attack (TIA), and cerebral infarction without residual deficits: Secondary | ICD-10-CM | POA: Insufficient documentation

## 2015-02-23 DIAGNOSIS — I10 Essential (primary) hypertension: Secondary | ICD-10-CM | POA: Diagnosis not present

## 2015-02-23 DIAGNOSIS — Z880 Allergy status to penicillin: Secondary | ICD-10-CM | POA: Diagnosis not present

## 2015-02-23 DIAGNOSIS — Z862 Personal history of diseases of the blood and blood-forming organs and certain disorders involving the immune mechanism: Secondary | ICD-10-CM | POA: Insufficient documentation

## 2015-02-23 DIAGNOSIS — Z7982 Long term (current) use of aspirin: Secondary | ICD-10-CM | POA: Diagnosis not present

## 2015-02-23 DIAGNOSIS — Z792 Long term (current) use of antibiotics: Secondary | ICD-10-CM | POA: Diagnosis not present

## 2015-02-23 DIAGNOSIS — Z89511 Acquired absence of right leg below knee: Secondary | ICD-10-CM | POA: Insufficient documentation

## 2015-02-23 DIAGNOSIS — Z7901 Long term (current) use of anticoagulants: Secondary | ICD-10-CM | POA: Diagnosis not present

## 2015-02-23 DIAGNOSIS — Z720 Tobacco use: Secondary | ICD-10-CM | POA: Insufficient documentation

## 2015-02-23 LAB — COMPREHENSIVE METABOLIC PANEL
ALT: 39 U/L — AB (ref 0–35)
AST: 28 U/L (ref 0–37)
Albumin: 3.6 g/dL (ref 3.5–5.2)
Alkaline Phosphatase: 93 U/L (ref 39–117)
Anion gap: 11 (ref 5–15)
BILIRUBIN TOTAL: 0.5 mg/dL (ref 0.3–1.2)
BUN: 15 mg/dL (ref 6–23)
CO2: 25 mmol/L (ref 19–32)
Calcium: 9.2 mg/dL (ref 8.4–10.5)
Chloride: 105 mmol/L (ref 96–112)
Creatinine, Ser: 0.82 mg/dL (ref 0.50–1.10)
GFR calc Af Amer: 85 mL/min — ABNORMAL LOW (ref >90)
GFR calc non Af Amer: 73 mL/min — ABNORMAL LOW (ref >90)
Glucose, Bld: 136 mg/dL — ABNORMAL HIGH (ref 70–99)
Potassium: 4.2 mmol/L (ref 3.5–5.1)
SODIUM: 141 mmol/L (ref 135–145)
Total Protein: 6.5 g/dL (ref 6.0–8.3)

## 2015-02-23 LAB — CBC
HCT: 38.6 % (ref 36.0–46.0)
Hemoglobin: 12.7 g/dL (ref 12.0–15.0)
MCH: 32.2 pg (ref 26.0–34.0)
MCHC: 32.9 g/dL (ref 30.0–36.0)
MCV: 98 fL (ref 78.0–100.0)
PLATELETS: 306 10*3/uL (ref 150–400)
RBC: 3.94 MIL/uL (ref 3.87–5.11)
RDW: 12.8 % (ref 11.5–15.5)
WBC: 7.9 10*3/uL (ref 4.0–10.5)

## 2015-02-23 LAB — I-STAT CG4 LACTIC ACID, ED
Lactic Acid, Venous: 1.5 mmol/L (ref 0.5–2.0)
Lactic Acid, Venous: 1.04 mmol/L (ref 0.5–2.0)

## 2015-02-23 LAB — CBG MONITORING, ED: GLUCOSE-CAPILLARY: 128 mg/dL — AB (ref 70–99)

## 2015-02-23 MED ORDER — SULFAMETHOXAZOLE-TRIMETHOPRIM 800-160 MG PO TABS: 1.0000 | ORAL_TABLET | Freq: Two times a day (BID) | ORAL | Status: DC

## 2015-02-23 NOTE — ED Notes (Signed)
Pt has hx of diabetes- noted to have multiple wounds on left foot.  Pt was seen at wound clinic yesterday and had area "trimmed", pt has had blood coming from the site last night and this morning- dressing applied at present.

## 2015-02-23 NOTE — ED Provider Notes (Signed)
CSN: [id number]     Arrival date & time 02/23/15  1824 History   First MD Initiated Contact with Patient 02/23/15 2202     No chief complaint on file.    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  She reports that the wounds have been present for the past 6 weeks.  She is unsure if the wounds are unchanged in appearance.  She states that she is followed by Wound Management once a week.  She states that she saw Wound Care yesterday.  She reports that the wounds were "trimmed" yesterday and have been bleeding ever since.  She came into the ED today because she was concerned about the bleeding.  Bleeding is controlled at this time.  She states that she is currently not on any antibiotics.  She has not noticed any purulent drainage from the wounds.  Denies fever, chills, nausea, vomiting, numbness, or tingling.  She denies any redness or warmth of the skin surrounding the wounds.    The history is provided by the patient.    Past Medical History  Diagnosis Date  . Hypertension   . Diabetes mellitus   . Chest pain     Troponins up to 0.12 while hospitalized for E.coli PNA  . 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 Bacteremia   . Anemia   . Narcolepsy   . Stroke    Past Surgical History  Procedure Laterality Date  . No past surgeries    . Amputation Right 11/03/2014    Procedure: AMPUTATION BELOW KNEE;  Surgeon: Sherren Kernsharles E Fields, MD;  [facility];  Service: Vascular;  Laterality: Right;  . Lower extremity angiogram N/A 10/03/2014    Procedure: LOWER EXTREMITY ANGIOGRAM;  Surgeon: Pamella PertJagadeesh R Ganji, MD;  [facility];  Service: Cardiovascular;  Laterality: N/A;   Family History  Problem Relation Age of Onset  . CAD Mother   . CAD Father     History  Substance Use Topics  . Smoking status: Current Every Day Smoker -- 0.25 packs/day for 39 years    Types: Cigarettes  . Smokeless tobacco: Never Used  . Alcohol Use: No   OB History    No data available     Review of Systems  All other systems reviewed and are negative.     Allergies  Penicillins  Home Medications   Prior to Admission medications   Medication Sig Start Date End Date Taking? Authorizing Provider  ALPRAZolam Prudy Feeler(XANAX) 0.25 MG tablet Take one tablet by mouth twice daily as needed for anxiety 11/10/14   Tiffany L Reed, DO  aspirin EC 81 MG tablet Take 81 mg by mouth daily.    Historical Provider, MD  atorvastatin (LIPITOR) 10 MG tablet Take 1 tablet (10 mg total) by mouth daily at 6 PM. 11/04/13   Evlyn KannerPamela S Love, PA-C  calcium-vitamin D (OSCAL WITH D) 500-200 MG-UNIT per tablet Take 1 tablet by mouth daily with breakfast.    Historical Provider, MD  cephALEXin (KEFLEX) 500 MG capsule Take 1 capsule (500 mg total) by mouth 3 (three) times daily. 01/30/15   Ovid CurdMatthew Wagoner, DPM  cilostazol (PLETAL) 100 MG tablet Take 1 tablet (100 mg total) by mouth 2 (two) times daily. 10/03/14   Pamella PertJagadeesh R Ganji,  MD  clopidogrel (PLAVIX) 75 MG tablet Take 75 mg by mouth daily.    Historical Provider, MD  co-enzyme Q-10 30 MG capsule Take 100 mg by mouth daily.     Historical Provider, MD  docusate sodium (COLACE) 100 MG capsule Take 100 mg by mouth 2 (two) times daily.    Historical Provider, MD  DULoxetine (CYMBALTA) 60 MG capsule Take 60 mg by mouth daily.    Historical Provider, MD  gabapentin (NEURONTIN) 300 MG capsule Take 1 capsule (300 mg total) by mouth 2 (two) times daily. Patient taking differently: Take 300 mg by mouth 3 (three) times daily.  03/13/14   Erick Colace, MD  hydrochlorothiazide (MICROZIDE) 12.5 MG capsule Take 1 capsule (12.5 mg total) by mouth daily. 11/07/14   Jeralyn Bennett, MD  HYDROcodone-acetaminophen (NORCO/VICODIN) 5-325 MG per tablet  Take one tablet by mouth every 6 hours as needed for pain 11/21/14   Oneal Grout, MD  insulin glargine (LANTUS) 100 UNIT/ML injection Inject 0.2 mLs (20 Units total) into the skin at bedtime. 11/07/14   Jeralyn Bennett, MD  lisinopril (PRINIVIL,ZESTRIL) 20 MG tablet Take 1 tablet (20 mg total) by mouth daily. 11/07/14   Jeralyn Bennett, MD  Melatonin 10 MG CAPS Take 1 tablet by mouth at bedtime.    Historical Provider, MD  methocarbamol (ROBAXIN) 500 MG tablet Take 500 mg by mouth 4 (four) times daily.    Historical Provider, MD  modafinil (PROVIGIL) 200 MG tablet Take one tablet by mouth twice daily for narcolepsy 01/09/15   Oneal Grout, MD  Omega-3 Fatty Acids (ULTRA OMEGA 3 PO) Take 1 capsule by mouth daily.    Historical Provider, MD  SANTYL ointment  10/26/14   Historical Provider, MD  silver sulfADIAZINE (SILVADENE) 1 % cream Apply 1 application topically daily. 01/30/15   Ovid Curd, DPM   BP 152/83 mmHg  Pulse 75  Temp(Src) 98.2 F (36.8 C) (Oral)  Resp 16  SpO2 100% Physical Exam  Constitutional: She appears well-developed and well-nourished.  HENT:  Head: Normocephalic.  Mouth/Throat: Oropharynx is clear and moist.  Neck: Normal range of motion. Neck supple.  Cardiovascular: Normal rate, regular rhythm and normal heart sounds.   DP pulse found with Doppler  Pulmonary/Chest: Effort normal and breath sounds normal.  Abdominal: Soft. Bowel sounds are normal.  Musculoskeletal: Normal range of motion.  Patient able to wiggle all her toes on the left foot Right BKA  Neurological: She is alert.  Distal sensation of the left foot intact  Skin: Skin is warm and dry.  Ulcerated wounds of the 2nd, 3rd, and 4th toes.  See picture below.  Third toe actively bleeding a small amount.  All toes feel cold to palpation.  Small areas of necrosis of the wound of the 3rd toe.  No erythema, or warmth surrounding the wounds.   Psychiatric: She has a normal mood and affect.  Nursing note and  vitals reviewed.   ED Course  Procedures (including critical care time) Labs Review Labs Reviewed  COMPREHENSIVE METABOLIC PANEL - Abnormal; Notable for the following:    Glucose, Bld 136 (*)    ALT 39 (*)    GFR calc non Af Amer 73 (*)    GFR calc Af Amer 85 (*)    All other components within normal limits  CBG MONITORING, ED - Abnormal; Notable for the following:    Glucose-Capillary 128 (*)    All other components within normal limits  CBC  I-STAT CG4 LACTIC  ACID, ED  I-STAT CG4 LACTIC ACID, ED    Imaging Review No results found.   EKG Interpretation None       11:30 PM Discussed with Dr. Myra Gianotti with Vascular Surgery.  He recommends putting the patient on Bactrim and having her follow up in the Vascular Surgery office next week. MDM   Final diagnoses:  None   Patient presents today with wounds of the 2nd, 3rd, and 4th toes of the left foot that have been present for the past 6 weeks.  She states that she saw Wound Management yesterday and had the wounds "trimmed" at that time.  She reports bleeding of the wounds after they were trimmed.  She is unsure if the the wounds appear worse today than usual.  No signs of infection today.  Toes feel cold, but DP is heard with Doppler.  Discussed with Dr. Myra Gianotti with Vascular Surgery.  He recommends having the patient follow up in the office next week. Patient stable for discharge.  Return precautions given.      Santiago Glad, PA-C 02/25/15 1340  Tilden Fossa, MD 02/25/15 [phone]

## 2015-02-24 NOTE — Discharge Instructions (Signed)
It is important for you to follow up with Dr. Darrick PennaFields with Vascular Surgery.  Call on Monday to schedule an appointment.  Return to the Emergency Department if symptoms change or worsen or you develop a fever.  Take antibiotics as prescribed.

## 2015-02-27 ENCOUNTER — Telehealth: Payer: Self-pay

## 2015-02-27 NOTE — Telephone Encounter (Signed)
Spoke with patient to schedule for 03/01/15 @ 10:00 lab and 10:45 CEF  dpm

## 2015-02-27 NOTE — Telephone Encounter (Signed)
Phone call from pt'  Reported she was seen in the ER on 02/23/15, and has sores on left foot toes #2, 3, and 4. Stated she has small amt. Bleeding from the sores.  Was started on Septra DS 1 bid and instructed to call Dr. Darrick PennaFields this week for an appt.  Denies fever/ chills.  Stated she is currently going to [facility] for treatment of left foot.  Advised pt. will schedule appt. with Dr. Darrick PennaFields, and call back with information on appt.

## 2015-02-28 ENCOUNTER — Encounter: Payer: Self-pay | Admitting: Vascular Surgery

## 2015-03-01 ENCOUNTER — Ambulatory Visit (INDEPENDENT_AMBULATORY_CARE_PROVIDER_SITE_OTHER): Payer: Medicare Other | Admitting: Vascular Surgery

## 2015-03-01 ENCOUNTER — Encounter: Payer: Self-pay | Admitting: Vascular Surgery

## 2015-03-01 ENCOUNTER — Ambulatory Visit (HOSPITAL_COMMUNITY)
Admission: RE | Admit: 2015-03-01 | Discharge: 2015-03-01 | Disposition: A | Discharge status: Home / Self Care | Payer: Medicare Other | Source: Ambulatory Visit | Attending: Vascular Surgery | Admitting: Vascular Surgery

## 2015-03-01 ENCOUNTER — Ambulatory Visit (INDEPENDENT_AMBULATORY_CARE_PROVIDER_SITE_OTHER)
Admission: RE | Admit: 2015-03-01 | Discharge: 2015-03-01 | Disposition: A | Discharge status: Home / Self Care | Payer: Medicare Other | Source: Ambulatory Visit | Attending: Vascular Surgery | Admitting: Vascular Surgery

## 2015-03-01 ENCOUNTER — Other Ambulatory Visit: Payer: Self-pay

## 2015-03-01 VITALS — BP 175/67 | HR 83 | Temp 98.3°F | Ht 61.0 in | Wt 185.0 lb

## 2015-03-01 DIAGNOSIS — L97511 Non-pressure chronic ulcer of other part of right foot limited to breakdown of skin: Secondary | ICD-10-CM

## 2015-03-01 DIAGNOSIS — I70209 Unspecified atherosclerosis of native arteries of extremities, unspecified extremity: Secondary | ICD-10-CM

## 2015-03-01 DIAGNOSIS — I739 Peripheral vascular disease, unspecified: Secondary | ICD-10-CM | POA: Insufficient documentation

## 2015-03-01 DIAGNOSIS — L98499 Non-pressure chronic ulcer of skin of other sites with unspecified severity: Secondary | ICD-10-CM | POA: Diagnosis not present

## 2015-03-01 NOTE — Progress Notes (Signed)
[facility] [place] HISTORY AND PHYSICAL   History of Present Illness:  Patient is a 68 y.o. year old female who presents for evaluation of nonhealing wound left foot.  The patient is previously known to me for a prior right below-knee amputation several months ago. She has now been walking on a right leg prosthetic for a few weeks. She discussed her left foot somehow developed blisters recently. She has now had a wound on her left foot for approximately one month. She states that she has been followed at the wound center here in [place]. She was seen by my partner Dr. Brabham in the emergency room several days ago for bleeding of her left foot after a debridement at the wound center. Prior arteriogram in October 2015 by Dr. Gangi showed no percutaneous options with one-vessel runoff via the posterior tibial artery at the level of the ankle.  The patient denies any recent episodes of chest pain or shortness of breath. However, she has not had a cardiac stress test since May 2014..  Other medical problems include hypertension, diabetes, stage III sacral decubitus left buttocks all of which are currently stable. She states she is not currently smoking however she did smell of tobacco today. She is on aspirin and Plavix.  Past Medical History  Diagnosis Date  . Hypertension   . Diabetes mellitus   . Chest pain     Troponins up to 0.12 while hospitalized for E.coli PNA  . 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 Bacteremia   . Anemia   . Narcolepsy   . Stroke     Past Surgical History  Procedure Laterality Date  . No past surgeries    . Amputation Right 11/03/2014    Procedure: AMPUTATION BELOW KNEE;  Surgeon: Charles E Fields, MD;  Location: [facility] OR;  Service: Vascular;  Laterality: Right;  . Lower extremity angiogram N/A 10/03/2014    Procedure: LOWER EXTREMITY ANGIOGRAM;  Surgeon: Jagadeesh R Ganji, MD;  Location: [facility] CATH LAB;  Service: Cardiovascular;   Laterality: N/A;    Social History History  Substance Use Topics  . Smoking status: Current Every Day Smoker -- 0.25 packs/day for 39 years    Types: Cigarettes  . Smokeless tobacco: Never Used  . Alcohol Use: No    Family History Family History  Problem Relation Age of Onset  . CAD Mother   . CAD Father     Allergies  Allergies  Allergen Reactions  . Penicillins Hives    Childhood allergy could have been more reaction only remembers hives.      Current Outpatient Prescriptions  Medication Sig Dispense Refill  . ALPRAZolam (XANAX) 0.25 MG tablet Take one tablet by mouth twice daily as needed for anxiety (Patient taking differently: Take 0.25 mg by mouth 2 (two) times daily. Take one tablet by mouth twice daily as needed for anxiety) 60 tablet 2  . amphetamine-dextroamphetamine (ADDERALL) 10 MG tablet Take 10 mg by mouth 2 (two) times daily with a meal.    . aspirin EC 81 MG tablet Take 81 mg by mouth daily.    . aspirin EC 81 MG tablet Take 81 mg by mouth daily.    . atorvastatin (LIPITOR) 10 MG tablet Take 1 tablet (10 mg total) by mouth daily at 6 PM. 30 tablet 1  . cephALEXin (KEFLEX) 500 MG capsule Take 1 capsule (500 mg total) by mouth 3 (three) times daily. 30   capsule 2  . Cholecalciferol (VITAMIN D3 PO) Take 1 tablet by mouth daily.    . cilostazol (PLETAL) 100 MG tablet Take 1 tablet (100 mg total) by mouth 2 (two) times daily. 60 tablet 3  . citalopram (CELEXA) 10 MG tablet Take 10 mg by mouth daily.    . clopidogrel (PLAVIX) 75 MG tablet Take 75 mg by mouth daily.    . Coenzyme Q10 (COQ10 PO) Take 1 capsule by mouth daily.    . DULoxetine (CYMBALTA) 60 MG capsule Take 60 mg by mouth daily.    . gabapentin (NEURONTIN) 300 MG capsule Take 1 capsule (300 mg total) by mouth 2 (two) times daily. (Patient taking differently: Take 300 mg by mouth 3 (three) times daily. ) 60 capsule 1  . hydrochlorothiazide (MICROZIDE) 12.5 MG capsule Take 1 capsule (12.5 mg total) by  mouth daily. 30 capsule 1  . HYDROcodone-acetaminophen (NORCO/VICODIN) 5-325 MG per tablet Take one tablet by mouth every 6 hours as needed for pain 120 tablet 0  . insulin glargine (LANTUS) 100 UNIT/ML injection Inject 0.2 mLs (20 Units total) into the skin at bedtime. 10 mL 11  . lisinopril (PRINIVIL,ZESTRIL) 20 MG tablet Take 1 tablet (20 mg total) by mouth daily. 30 tablet 1  . modafinil (PROVIGIL) 200 MG tablet Take one tablet by mouth twice daily for narcolepsy 60 tablet 0  . SANTYL ointment Apply 1 application topically daily.   2  . silver sulfADIAZINE (SILVADENE) 1 % cream Apply 1 application topically daily. 50 g 0  . sulfamethoxazole-trimethoprim (SEPTRA DS) 800-160 MG per tablet Take 1 tablet by mouth 2 (two) times daily. 28 tablet 0   No current facility-administered medications for this visit.    ROS:   General:  No weight loss, Fever, chills  HEENT: No recent headaches, no nasal bleeding, no visual changes, no sore throat  Neurologic: No dizziness, blackouts, seizures. No recent symptoms of stroke or mini- stroke. No recent episodes of slurred speech, or temporary blindness.  Cardiac: No recent episodes of chest pain/pressure, no shortness of breath at rest.  + shortness of breath with exertion.  Denies history of atrial fibrillation or irregular heartbeat  Vascular: No history of rest pain in feet.  No history of claudication.  + history of non-healing ulcer, No history of DVT   Pulmonary: No home oxygen, no productive cough, no hemoptysis,  No asthma or wheezing  Musculoskeletal:  [ ] Arthritis, [ ] Low back pain,  [ ] Joint pain  Hematologic:No history of hypercoagulable state.  No history of easy bleeding.  No history of anemia  Gastrointestinal: No hematochezia or melena,  No gastroesophageal reflux, no trouble swallowing  Urinary: [ ] chronic Kidney disease, [ ] on HD - [ ] MWF or [ ] TTHS, [ ] Burning with urination, [ ] Frequent urination, [ ] Difficulty  urinating;   Skin: No rashes  Psychological: No history of anxiety,  No history of depression   Physical Examination  Filed Vitals:   03/01/15 1226  BP: 175/67  Pulse: 83  Temp: 98.3 F (36.8 C)  TempSrc: Oral  Height: 5' 1" (1.549 m)  Weight: 185 lb (83.915 kg)  SpO2: 99%    Body mass index is 34.97 kg/(m^2).  General:  Alert and oriented, no acute distress HEENT: Normal Neck: No bruit or JVD Pulmonary: Clear to auscultation bilaterally Cardiac: Regular Rate and Rhythm without murmur Abdomen: Soft, non-tender, non-distended, no mass Skin: No rash, 3 x 2 cm superficial   ulcerations dorsum of left second third and fourth toes some fibrillar segs today Extremity Pulses:  2+ radial, brachial, femoral, absent popliteal dorsalis pedis, posterior tibial pulses left leg Musculoskeletal: No deformity 1-2+ edema left leg  Neurologic: Upper and lower extremity motor 5/5 and symmetric  DATA:  Patient had left leg ABIs performed today. I reviewed and interpreted this study. ABI on the left was 0.35.    I also reviewed the patient's prior arteriogram from October 2015 which again shows a patent inflow system with one-vessel runoff via reconstituted to the posterior tibial artery just above the ankle  ASSESSMENT: Patient with prior right below-knee amputation. Now with threatened limb on the left lower extremity. She is not a percutaneous revascularization candidate due to lengthy extent of disease. She may possibly be a candidate for a left femoral to posterior tibial artery bypass. However her arteriogram was approximately 5 months ago. This will need to be repeated to make sure that her runoff has not changed. She will also need a left lower extremity vein mapping to see if she has adequate conduit to reach all the way to her left ankle. She will also need cardiac risk stratification prior to considering any leg bypass as her last stress test was in May of last year.   PLAN:  #1 repeat  aortogram and left lower extremity runoff by Dr. Dickson on Monday, March 14. #2 tentatively we would do a left femoral to posterior tibial artery bypass on her on Tuesday if we are able to get some sort of cardiac testing prior to this. This is also dependent on whether or not she has adequate conduit and she will have a left leg vein mapping today. If we are unable to accomplish the above 3 we may need to defer her bypass. If she has inadequate conduit or inadequate runoff then continued follow-up at the wound care center would be the best plan for her. Also she would need to have her Plavix stopped today.  Charles Fields, MD [facility] [place] Office: [phone] Pager: [phone]  Addendum: Patient had a left greater saphenous vein mapping today which shows the vein is 2 mm in diameter from the mid to distal calf from the proximal calf to the groin is 3-4 mm in diameter.  This should at least be reasonable conduit for a composite graft if necessary and hopefully will be long enough for the entire bypass. Her stress test is scheduled for tomorrow. Plan is for arteriogram Monday and bypass Tuesday as long as her cardiac stress test is reasonable. She will stop her Plavix today.  Charles Fields, MD [facility] [place] Office: [phone] Pager: [phone]    

## 2015-03-04 MED ORDER — VANCOMYCIN HCL IN DEXTROSE 1-5 GM/200ML-% IV SOLN
1000.0000 mg | INTRAVENOUS | Status: DC
  Filled 2015-03-04: qty 200

## 2015-03-05 ENCOUNTER — Encounter (HOSPITAL_COMMUNITY)
Admission: RE | Disposition: A | Discharge status: Skilled Nursing Facility | Payer: Self-pay | Source: Ambulatory Visit | Attending: Vascular Surgery

## 2015-03-05 ENCOUNTER — Other Ambulatory Visit (HOSPITAL_COMMUNITY): Payer: Medicare Other

## 2015-03-05 ENCOUNTER — Inpatient Hospital Stay (HOSPITAL_COMMUNITY)
Admission: RE | Admit: 2015-03-05 | Discharge: 2015-03-10 | DRG: 253 | Disposition: A | Discharge status: Skilled Nursing Facility | Payer: Medicare Other | Source: Ambulatory Visit | Attending: Vascular Surgery | Admitting: Vascular Surgery

## 2015-03-05 ENCOUNTER — Encounter (HOSPITAL_COMMUNITY): Payer: Self-pay | Admitting: General Practice

## 2015-03-05 ENCOUNTER — Other Ambulatory Visit: Payer: Self-pay

## 2015-03-05 DIAGNOSIS — I69354 Hemiplegia and hemiparesis following cerebral infarction affecting left non-dominant side: Secondary | ICD-10-CM

## 2015-03-05 DIAGNOSIS — Z79899 Other long term (current) drug therapy: Secondary | ICD-10-CM

## 2015-03-05 DIAGNOSIS — I70244 Atherosclerosis of native arteries of left leg with ulceration of heel and midfoot: Secondary | ICD-10-CM

## 2015-03-05 DIAGNOSIS — Z7982 Long term (current) use of aspirin: Secondary | ICD-10-CM

## 2015-03-05 DIAGNOSIS — Z89519 Acquired absence of unspecified leg below knee: Secondary | ICD-10-CM | POA: Diagnosis not present

## 2015-03-05 DIAGNOSIS — I10 Essential (primary) hypertension: Secondary | ICD-10-CM | POA: Diagnosis present

## 2015-03-05 DIAGNOSIS — I739 Peripheral vascular disease, unspecified: Principal | ICD-10-CM | POA: Diagnosis present

## 2015-03-05 DIAGNOSIS — Z794 Long term (current) use of insulin: Secondary | ICD-10-CM

## 2015-03-05 DIAGNOSIS — Z7902 Long term (current) use of antithrombotics/antiplatelets: Secondary | ICD-10-CM

## 2015-03-05 DIAGNOSIS — S91302A Unspecified open wound, left foot, initial encounter: Secondary | ICD-10-CM | POA: Diagnosis present

## 2015-03-05 DIAGNOSIS — M545 Low back pain: Secondary | ICD-10-CM | POA: Diagnosis not present

## 2015-03-05 DIAGNOSIS — E1151 Type 2 diabetes mellitus with diabetic peripheral angiopathy without gangrene: Secondary | ICD-10-CM | POA: Diagnosis present

## 2015-03-05 DIAGNOSIS — F418 Other specified anxiety disorders: Secondary | ICD-10-CM | POA: Diagnosis present

## 2015-03-05 DIAGNOSIS — E119 Type 2 diabetes mellitus without complications: Secondary | ICD-10-CM | POA: Diagnosis present

## 2015-03-05 DIAGNOSIS — F17210 Nicotine dependence, cigarettes, uncomplicated: Secondary | ICD-10-CM | POA: Diagnosis present

## 2015-03-05 DIAGNOSIS — G47419 Narcolepsy without cataplexy: Secondary | ICD-10-CM | POA: Diagnosis present

## 2015-03-05 DIAGNOSIS — D62 Acute posthemorrhagic anemia: Secondary | ICD-10-CM | POA: Diagnosis not present

## 2015-03-05 DIAGNOSIS — R000 Tachycardia, unspecified: Secondary | ICD-10-CM | POA: Diagnosis not present

## 2015-03-05 DIAGNOSIS — Z09 Encounter for follow-up examination after completed treatment for conditions other than malignant neoplasm: Secondary | ICD-10-CM

## 2015-03-05 HISTORY — PX: ABDOMINAL AORTAGRAM: SHX5454

## 2015-03-05 HISTORY — DX: Pure hypercholesterolemia, unspecified: E78.00

## 2015-03-05 HISTORY — DX: Anxiety disorder, unspecified: F41.9

## 2015-03-05 HISTORY — DX: Pneumonia, unspecified organism: J18.9

## 2015-03-05 HISTORY — DX: Headache: R51

## 2015-03-05 HISTORY — DX: Family history of other specified conditions: Z84.89

## 2015-03-05 HISTORY — DX: Bursopathy, unspecified: M71.9

## 2015-03-05 HISTORY — DX: Depression, unspecified: F32.A

## 2015-03-05 HISTORY — DX: Headache, unspecified: R51.9

## 2015-03-05 HISTORY — DX: Type 2 diabetes mellitus without complications: E11.9

## 2015-03-05 HISTORY — DX: Major depressive disorder, single episode, unspecified: F32.9

## 2015-03-05 LAB — POCT I-STAT, CHEM 8
BUN: 31 mg/dL — ABNORMAL HIGH (ref 6–23)
CHLORIDE: 107 mmol/L (ref 96–112)
Calcium, Ion: 1.28 mmol/L (ref 1.13–1.30)
Creatinine, Ser: 1 mg/dL (ref 0.50–1.10)
Glucose, Bld: 103 mg/dL — ABNORMAL HIGH (ref 70–99)
HEMATOCRIT: 38 % (ref 36.0–46.0)
Hemoglobin: 12.9 g/dL (ref 12.0–15.0)
POTASSIUM: 4.3 mmol/L (ref 3.5–5.1)
Sodium: 140 mmol/L (ref 135–145)
TCO2: 22 mmol/L (ref 0–100)

## 2015-03-05 LAB — CREATININE, SERUM
Creatinine, Ser: 0.92 mg/dL (ref 0.50–1.10)
GFR calc Af Amer: 74 mL/min — ABNORMAL LOW (ref >90)
GFR, EST NON AFRICAN AMERICAN: 63 mL/min — AB (ref >90)

## 2015-03-05 LAB — CBC
HCT: 35.4 % — ABNORMAL LOW (ref 36.0–46.0)
Hemoglobin: 11.8 g/dL — ABNORMAL LOW (ref 12.0–15.0)
MCH: 32.4 pg (ref 26.0–34.0)
MCHC: 33.3 g/dL (ref 30.0–36.0)
MCV: 97.3 fL (ref 78.0–100.0)
Platelets: 261 10*3/uL (ref 150–400)
RBC: 3.64 MIL/uL — AB (ref 3.87–5.11)
RDW: 12.9 % (ref 11.5–15.5)
WBC: 7.8 10*3/uL (ref 4.0–10.5)

## 2015-03-05 LAB — GLUCOSE, CAPILLARY
Glucose-Capillary: 117 mg/dL — ABNORMAL HIGH (ref 70–99)
Glucose-Capillary: 229 mg/dL — ABNORMAL HIGH (ref 70–99)
Glucose-Capillary: 108 mg/dL — ABNORMAL HIGH (ref 70–99)

## 2015-03-05 SURGERY — ABDOMINAL AORTAGRAM
Anesthesia: LOCAL

## 2015-03-05 MED ORDER — ENOXAPARIN SODIUM 40 MG/0.4ML ~~LOC~~ SOLN
40.0000 mg | SUBCUTANEOUS | Status: DC
  Administered 2015-03-07 – 2015-03-08 (×2): 40 mg via SUBCUTANEOUS
  Filled 2015-03-05 (×4): qty 0.4

## 2015-03-05 MED ORDER — INSULIN GLARGINE 100 UNIT/ML ~~LOC~~ SOLN
20.0000 [IU] | Freq: Every day | SUBCUTANEOUS | Status: DC
  Administered 2015-03-06 – 2015-03-09 (×4): 20 [IU] via SUBCUTANEOUS
  Filled 2015-03-05 (×7): qty 0.2

## 2015-03-05 MED ORDER — CEPHALEXIN 500 MG PO CAPS
500.0000 mg | ORAL_CAPSULE | Freq: Three times a day (TID) | ORAL | Status: DC
  Administered 2015-03-05 – 2015-03-10 (×13): 500 mg via ORAL
  Filled 2015-03-05 (×15): qty 1

## 2015-03-05 MED ORDER — ASPIRIN EC 81 MG PO TBEC
81.0000 mg | DELAYED_RELEASE_TABLET | Freq: Every day | ORAL | Status: DC
  Administered 2015-03-07 – 2015-03-10 (×4): 81 mg via ORAL
  Filled 2015-03-05 (×4): qty 1

## 2015-03-05 MED ORDER — LISINOPRIL 20 MG PO TABS
20.0000 mg | ORAL_TABLET | Freq: Every day | ORAL | Status: DC
  Administered 2015-03-07 – 2015-03-10 (×4): 20 mg via ORAL
  Filled 2015-03-05 (×4): qty 1

## 2015-03-05 MED ORDER — SODIUM CHLORIDE 0.9 % IV SOLN
1.0000 mL/kg/h | INTRAVENOUS | Status: AC
  Administered 2015-03-05: 1 mL/kg/h via INTRAVENOUS

## 2015-03-05 MED ORDER — ACETAMINOPHEN 325 MG PO TABS: 650.0000 mg | ORAL_TABLET | ORAL | Status: DC | PRN

## 2015-03-05 MED ORDER — GABAPENTIN 300 MG PO CAPS
300.0000 mg | ORAL_CAPSULE | Freq: Three times a day (TID) | ORAL | Status: DC
  Administered 2015-03-05 – 2015-03-10 (×13): 300 mg via ORAL
  Filled 2015-03-05 (×15): qty 1

## 2015-03-05 MED ORDER — HEPARIN (PORCINE) IN NACL 2-0.9 UNIT/ML-% IJ SOLN
INTRAMUSCULAR | Status: AC
  Filled 2015-03-05: qty 1000

## 2015-03-05 MED ORDER — CITALOPRAM HYDROBROMIDE 10 MG PO TABS
10.0000 mg | ORAL_TABLET | Freq: Every day | ORAL | Status: DC
  Administered 2015-03-07 – 2015-03-10 (×4): 10 mg via ORAL
  Filled 2015-03-05 (×4): qty 1

## 2015-03-05 MED ORDER — DULOXETINE HCL 60 MG PO CPEP
60.0000 mg | ORAL_CAPSULE | Freq: Every day | ORAL | Status: DC
  Administered 2015-03-07 – 2015-03-10 (×4): 60 mg via ORAL
  Filled 2015-03-05 (×4): qty 1

## 2015-03-05 MED ORDER — ONDANSETRON HCL 4 MG/2ML IJ SOLN: 4.0000 mg | Freq: Four times a day (QID) | INTRAMUSCULAR | Status: DC | PRN

## 2015-03-05 MED ORDER — OXYCODONE-ACETAMINOPHEN 5-325 MG PO TABS
1.0000 | ORAL_TABLET | ORAL | Status: DC | PRN
Start: 2015-03-05 — End: 2015-03-10
  Administered 2015-03-06 – 2015-03-07 (×3): 2 via ORAL
  Administered 2015-03-08: 1 via ORAL
  Administered 2015-03-08 – 2015-03-09 (×2): 2 via ORAL
  Administered 2015-03-09: 1 via ORAL
  Filled 2015-03-05 (×2): qty 1
  Filled 2015-03-05 (×5): qty 2

## 2015-03-05 MED ORDER — SODIUM CHLORIDE 0.9 % IV SOLN: INTRAVENOUS | Status: DC

## 2015-03-05 MED ORDER — HYDROCHLOROTHIAZIDE 12.5 MG PO CAPS
12.5000 mg | ORAL_CAPSULE | Freq: Every day | ORAL | Status: DC
  Administered 2015-03-07 – 2015-03-10 (×4): 12.5 mg via ORAL
  Filled 2015-03-05 (×4): qty 1

## 2015-03-05 MED ORDER — SULFAMETHOXAZOLE-TRIMETHOPRIM 800-160 MG PO TABS
1.0000 | ORAL_TABLET | Freq: Two times a day (BID) | ORAL | Status: DC
  Administered 2015-03-05 – 2015-03-10 (×9): 1 via ORAL
  Filled 2015-03-05 (×12): qty 1

## 2015-03-05 MED ORDER — ALPRAZOLAM 0.25 MG PO TABS
0.2500 mg | ORAL_TABLET | Freq: Two times a day (BID) | ORAL | Status: DC
  Administered 2015-03-05 – 2015-03-10 (×9): 0.25 mg via ORAL
  Filled 2015-03-05 (×9): qty 1

## 2015-03-05 MED ORDER — FENTANYL CITRATE 0.05 MG/ML IJ SOLN
INTRAMUSCULAR | Status: AC
  Filled 2015-03-05: qty 2

## 2015-03-05 MED ORDER — HYDRALAZINE HCL 20 MG/ML IJ SOLN
INTRAMUSCULAR | Status: AC
  Filled 2015-03-05: qty 1

## 2015-03-05 MED ORDER — CEFAZOLIN SODIUM 1-5 GM-% IV SOLN: 1.0000 g | INTRAVENOUS | Status: DC

## 2015-03-05 MED ORDER — CILOSTAZOL 100 MG PO TABS
100.0000 mg | ORAL_TABLET | Freq: Two times a day (BID) | ORAL | Status: DC
  Administered 2015-03-05 – 2015-03-08 (×6): 100 mg via ORAL
  Filled 2015-03-05 (×8): qty 1

## 2015-03-05 MED ORDER — COLLAGENASE 250 UNIT/GM EX OINT
1.0000 "application " | TOPICAL_OINTMENT | Freq: Every day | CUTANEOUS | Status: DC
  Administered 2015-03-05 – 2015-03-10 (×5): 1 via TOPICAL
  Filled 2015-03-05 (×2): qty 30

## 2015-03-05 MED ORDER — INSULIN ASPART 100 UNIT/ML ~~LOC~~ SOLN
0.0000 [IU] | Freq: Three times a day (TID) | SUBCUTANEOUS | Status: DC
  Administered 2015-03-05: 3 [IU] via SUBCUTANEOUS
  Administered 2015-03-07: 1 [IU] via SUBCUTANEOUS
  Administered 2015-03-07 (×2): 2 [IU] via SUBCUTANEOUS
  Administered 2015-03-08 – 2015-03-09 (×3): 1 [IU] via SUBCUTANEOUS

## 2015-03-05 MED ORDER — SODIUM CHLORIDE 0.9 % IV SOLN
INTRAVENOUS | Status: DC
  Administered 2015-03-05: 09:00:00 via INTRAVENOUS

## 2015-03-05 MED ORDER — AMPHETAMINE-DEXTROAMPHETAMINE 10 MG PO TABS
10.0000 mg | ORAL_TABLET | Freq: Two times a day (BID) | ORAL | Status: DC
  Administered 2015-03-07 – 2015-03-10 (×6): 10 mg via ORAL
  Filled 2015-03-05 (×7): qty 1

## 2015-03-05 MED ORDER — MODAFINIL 100 MG PO TABS
200.0000 mg | ORAL_TABLET | Freq: Every day | ORAL | Status: DC
  Administered 2015-03-07 – 2015-03-10 (×4): 200 mg via ORAL
  Filled 2015-03-05 (×4): qty 2

## 2015-03-05 MED ORDER — LIDOCAINE HCL (PF) 1 % IJ SOLN
INTRAMUSCULAR | Status: AC
Start: 2015-03-05 — End: 2015-03-05
  Filled 2015-03-05: qty 30

## 2015-03-05 MED ORDER — HYDROCODONE-ACETAMINOPHEN 5-325 MG PO TABS
1.0000 | ORAL_TABLET | ORAL | Status: DC | PRN
  Administered 2015-03-05 – 2015-03-07 (×2): 2 via ORAL
  Administered 2015-03-08 – 2015-03-10 (×4): 1 via ORAL
  Filled 2015-03-05 (×3): qty 1
  Filled 2015-03-05 (×2): qty 2
  Filled 2015-03-05 (×2): qty 1

## 2015-03-05 MED ORDER — CEFAZOLIN SODIUM-DEXTROSE 2-3 GM-% IV SOLR
2.0000 g | INTRAVENOUS | Status: DC
  Filled 2015-03-05: qty 50

## 2015-03-05 MED ORDER — VITAMIN D3 25 MCG (1000 UNIT) PO TABS
1000.0000 [IU] | ORAL_TABLET | Freq: Every day | ORAL | Status: DC
  Administered 2015-03-07 – 2015-03-10 (×4): 1000 [IU] via ORAL
  Filled 2015-03-05 (×5): qty 1

## 2015-03-05 MED ORDER — ATORVASTATIN CALCIUM 10 MG PO TABS
10.0000 mg | ORAL_TABLET | Freq: Every day | ORAL | Status: DC
  Administered 2015-03-05 – 2015-03-09 (×4): 10 mg via ORAL
  Filled 2015-03-05 (×5): qty 1

## 2015-03-05 MED ORDER — MIDAZOLAM HCL 2 MG/2ML IJ SOLN
INTRAMUSCULAR | Status: AC
  Filled 2015-03-05: qty 2

## 2015-03-05 MED ORDER — CEFAZOLIN SODIUM-DEXTROSE 2-3 GM-% IV SOLR
2.0000 g | INTRAVENOUS | Status: AC
  Administered 2015-03-06 (×2): 2 g via INTRAVENOUS
  Filled 2015-03-05: qty 50

## 2015-03-05 NOTE — Op Note (Signed)
   PATIENT: Laurie [name]   MRN: [id number] DOB: 01-May-1946    DATE OF PROCEDURE: 03/05/2015  INDICATIONS: Laurie [name] is a [age] y.o. female who presented with a nonhealing wound of her left foot. She is brought in for arteriography to be considered for revascularization.  PROCEDURE:  1. Ultrasound-guided access to the right common femoral artery 2. Aortogram with bilateral iliac arteriogram 3. Selective catheterization of the left external iliac artery with left lower extremity runoff (second order catheterization)  SURGEON: Di Kindlehristopher S. Edilia Boickson, MD, FACS  ANESTHESIA: local with sedation   EBL: minimal  TECHNIQUE: The patient was taken to the peripheral vascular lab and sedated with 1 mg of Versed and 25 g of fentanyl. Both groins are prepped and draped in usual sterile fashion. Under ultrasound guidance, after the skin was anesthetized, the right common femoral artery was cannulated and a guidewire introduced into the infrarenal aorta under fluoroscopic control. The pigtail catheter was positioned at the L1 vertebral body and flush aortogram obtained. The catheter was in position above the aortic bifurcation and oblique iliac projection was obtained. This catheter was then exchanged for a rim catheter which was positioned into the proximal left common iliac artery. The wire was advanced into the common femoral artery and then the rim catheter exchanged for a straight catheter. Selective left external iliac arteriograms obtained with left lower extremity runoff. The catheter was then retracted into the aorta and the wire advanced into the infrarenal aorta above the plaque in the mid segment of the infrarenal aorta. The wire was removed. Pressure gradient was measured across this area of stenosis that was no gradient at rest.The patient was transferred to the holding area for removal of the sheath. No immediate, occasions were noted.  FINDINGS:  1. There are single renal arteries bilaterally  with no significant renal artery stenosis identified. 2. There is an eccentric plaque in the mid segment of the infrarenal aorta but no significant stenosis. There was no gradient measured across this area. 3. The right common iliac, external iliac and internal iliac arteries are patent. 4. On the left side, the common iliac, external iliac, common femoral and deep femoral arteries are patent. 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 This is occluded distally with reconstitution of a moderately diseased peroneal artery. The posterior tibial artery reconstitutes in the distal third of the leg and the anterior tibial artery reconstitutes in the distal leg just above the ankle.  [name], MD, FACS [facility]  DATE OF DICTATION:   03/05/2015

## 2015-03-05 NOTE — Pre-Procedure Instructions (Signed)
Laurie [name]  03/05/2015   Your procedure is scheduled on:  03/06/15  [facility] at 530 AM.  Call this number if you have problems the morning of surgery: [phone]   Remember:   Do not eat food or drink liquids after midnight.   Take these medicines the morning of surgery with A SIP OF WATER: xanax,adderall,plental,celexa,cymbalta,neurontin,hydrocodone,provigil   Do not wear jewelry, make-up or nail polish.  Do not wear lotions, powders, or perfumes. You may wear deodorant.  Do not shave 48 hours prior to surgery. Men may shave face and neck.  Do not bring valuables to the hospital.  [facility] is not responsible                  for any belongings or valuables.               Contacts, dentures or bridgework may not be worn into surgery.  Leave suitcase in the car. After surgery it may be brought to your room.  For patients admitted to the hospital, discharge time is determined by your                treatment team.               Patients discharged the day of surgery will not be allowed to drive  home.  Name and phone number of your driver: family  Special Instructions: Shower using CHG 2 nights before surgery and the night before surgery.  If you shower the day of surgery use CHG.  Use special wash - you have one bottle of CHG for all showers.  You should use approximately 1/3 of the bottle for each shower.   Please read over the following fact sheets that you were given: Pain Booklet, Coughing and Deep Breathing, Blood Transfusion Information, MRSA Information and Surgical Site Infection Prevention

## 2015-03-05 NOTE — Progress Notes (Signed)
CBG 117 via fingers stick, pt arousalable to verbal stimuli; pt offers on c/o RFA dressing site clean,dry,intact, R LE noted with BKA, L lower extremity noted with doppler d/p. Awaiting bed assignment, V/S/S siderails up x2 ; bed in lowest position, Call bell placed at right hand. Monitoring.

## 2015-03-05 NOTE — Interval H&P Note (Signed)
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 After consideration of risks, benefits and other options for treatment, the patient has consented to  Procedure(s): ABDOMINAL AORTAGRAM (N/A) as a surgical intervention .  The patient's history has been reviewed, patient examined, no change in status, stable for surgery.  I have reviewed the patient's chart and labs.  Questions were answered to the patient's satisfaction.     Yoceline Bazar S

## 2015-03-05 NOTE — H&P (View-Only) (Signed)
[facility] [place] HISTORY AND PHYSICAL   History of Present Illness:  Patient is a 69 y.o. year old female who presents for evaluation of nonhealing wound left foot.  The patient is previously known to me for a prior right below-knee amputation several months ago. She has now been walking on a right leg prosthetic for a few weeks. She discussed her left foot somehow developed blisters recently. She has now had a wound on her left foot for approximately one month. She states that she has been followed at the wound center [place] in [place]. She was seen by my partner Dr. Myra GianottiBrabham in the emergency room several days ago for bleeding of her left foot after a debridement at the wound center. Prior arteriogram in October 2015 by Dr. Nadara EatonGangi showed no percutaneous options with one-vessel runoff via the posterior tibial artery at the level of the ankle.  The patient denies any recent episodes of chest pain or shortness of breath. However, she has not had a cardiac stress test since May 2014.Marland Kitchen.  Other medical problems include hypertension, diabetes, stage III sacral decubitus left buttocks all of which are currently stable. She states she is not currently smoking however she did smell of tobacco today. She is on aspirin and Plavix.  Past Medical History  Diagnosis Date  . Hypertension   . Diabetes mellitus   . Chest pain     Troponins up to 0.12 while hospitalized for E.coli PNA  . 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 Bacteremia   . Anemia   . Narcolepsy   . Stroke     Past Surgical History  Procedure Laterality Date  . No past surgeries    . Amputation Right 11/03/2014    Procedure: AMPUTATION BELOW KNEE;  Surgeon: Sherren Kernsharles E Fields, MD;  [facility];  Service: Vascular;  Laterality: Right;  . Lower extremity angiogram N/A 10/03/2014    Procedure: LOWER EXTREMITY ANGIOGRAM;  Surgeon: Pamella PertJagadeesh R Ganji, MD;  [facility];  Service: Cardiovascular;   Laterality: N/A;    Social History History  Substance Use Topics  . Smoking status: Current Every Day Smoker -- 0.25 packs/day for 39 years    Types: Cigarettes  . Smokeless tobacco: Never Used  . Alcohol Use: No    Family History Family History  Problem Relation Age of Onset  . CAD Mother   . CAD Father     Allergies  Allergies  Allergen Reactions  . Penicillins Hives    Childhood allergy could have been more reaction only remembers hives.      Current Outpatient Prescriptions  Medication Sig Dispense Refill  . ALPRAZolam (XANAX) 0.25 MG tablet Take one tablet by mouth twice daily as needed for anxiety (Patient taking differently: Take 0.25 mg by mouth 2 (two) times daily. Take one tablet by mouth twice daily as needed for anxiety) 60 tablet 2  . amphetamine-dextroamphetamine (ADDERALL) 10 MG tablet Take 10 mg by mouth 2 (two) times daily with a meal.    . aspirin EC 81 MG tablet Take 81 mg by mouth daily.    Marland Kitchen. aspirin EC 81 MG tablet Take 81 mg by mouth daily.    Marland Kitchen. atorvastatin (LIPITOR) 10 MG tablet Take 1 tablet (10 mg total) by mouth daily at 6 PM. 30 tablet 1  . cephALEXin (KEFLEX) 500 MG capsule Take 1 capsule (500 mg total) by mouth 3 (three) times daily. 30  capsule 2  . Cholecalciferol (VITAMIN D3 PO) Take 1 tablet by mouth daily.    . cilostazol (PLETAL) 100 MG tablet Take 1 tablet (100 mg total) by mouth 2 (two) times daily. 60 tablet 3  . citalopram (CELEXA) 10 MG tablet Take 10 mg by mouth daily.    . clopidogrel (PLAVIX) 75 MG tablet Take 75 mg by mouth daily.    . Coenzyme Q10 (COQ10 PO) Take 1 capsule by mouth daily.    . DULoxetine (CYMBALTA) 60 MG capsule Take 60 mg by mouth daily.    Marland Kitchen gabapentin (NEURONTIN) 300 MG capsule Take 1 capsule (300 mg total) by mouth 2 (two) times daily. (Patient taking differently: Take 300 mg by mouth 3 (three) times daily. ) 60 capsule 1  . hydrochlorothiazide (MICROZIDE) 12.5 MG capsule Take 1 capsule (12.5 mg total) by  mouth daily. 30 capsule 1  . HYDROcodone-acetaminophen (NORCO/VICODIN) 5-325 MG per tablet Take one tablet by mouth every 6 hours as needed for pain 120 tablet 0  . insulin glargine (LANTUS) 100 UNIT/ML injection Inject 0.2 mLs (20 Units total) into the skin at bedtime. 10 mL 11  . lisinopril (PRINIVIL,ZESTRIL) 20 MG tablet Take 1 tablet (20 mg total) by mouth daily. 30 tablet 1  . modafinil (PROVIGIL) 200 MG tablet Take one tablet by mouth twice daily for narcolepsy 60 tablet 0  . SANTYL ointment Apply 1 application topically daily.   2  . silver sulfADIAZINE (SILVADENE) 1 % cream Apply 1 application topically daily. 50 g 0  . sulfamethoxazole-trimethoprim (SEPTRA DS) 800-160 MG per tablet Take 1 tablet by mouth 2 (two) times daily. 28 tablet 0   No current facility-administered medications for this visit.    ROS:   General:  No weight loss, Fever, chills  HEENT: No recent headaches, no nasal bleeding, no visual changes, no sore throat  Neurologic: No dizziness, blackouts, seizures. No recent symptoms of stroke or mini- stroke. No recent episodes of slurred speech, or temporary blindness.  Cardiac: No recent episodes of chest pain/pressure, no shortness of breath at rest.  + shortness of breath with exertion.  Denies history of atrial fibrillation or irregular heartbeat  Vascular: No history of rest pain in feet.  No history of claudication.  + history of non-healing ulcer, No history of DVT   Pulmonary: No home oxygen, no productive cough, no hemoptysis,  No asthma or wheezing  Musculoskeletal:   Arthritis,  Low back pain,   Joint pain  Hematologic:No history of hypercoagulable state.  No history of easy bleeding.  No history of anemia  Gastrointestinal: No hematochezia or melena,  No gastroesophageal reflux, no trouble swallowing  Urinary:  chronic Kidney disease,  on HD -  MWF or  TTHS,  Burning with urination,  Frequent urination,  Difficulty  urinating;   Skin: No rashes  Psychological: No history of anxiety,  No history of depression   Physical Examination  Filed Vitals:   03/01/15 1226  BP: 175/67  Pulse: 83  Temp: 98.3 F (36.8 C)  TempSrc: Oral  Height:  (1.549 m)  Weight: 185 lb (83.915 kg)  SpO2: 99%    Body mass index is 34.97 kg/(m^2).  General:  Alert and oriented, no acute distress HEENT: Normal Neck: No bruit or JVD Pulmonary: Clear to auscultation bilaterally Cardiac: Regular Rate and Rhythm without murmur Abdomen: Soft, non-tender, non-distended, no mass Skin: No rash, 3 x 2 cm superficial  ulcerations dorsum of left second third and fourth toes some fibrillar segs today Extremity Pulses:  2+ radial, brachial, femoral, absent popliteal dorsalis pedis, posterior tibial pulses left leg Musculoskeletal: No deformity 1-2+ edema left leg  Neurologic: Upper and lower extremity motor 5/5 and symmetric  DATA:  Patient had left leg ABIs performed today. I reviewed and interpreted this study. ABI on the left was 0.35.    I also reviewed the patient's prior arteriogram from October 2015 which again shows a patent inflow system with one-vessel runoff via reconstituted to the posterior tibial artery just above the ankle  ASSESSMENT: Patient with prior right below-knee amputation. Now with threatened limb on the left lower extremity. She is not a percutaneous revascularization candidate due to lengthy extent of disease. She may possibly be a candidate for a left femoral to posterior tibial artery bypass. However her arteriogram was approximately 5 months ago. This will need to be repeated to make sure that her runoff has not changed. She will also need a left lower extremity vein mapping to see if she has adequate conduit to reach all the way to her left ankle. She will also need cardiac risk stratification prior to considering any leg bypass as her last stress test was in May of last year.   PLAN:  #1 repeat  aortogram and left lower extremity runoff by Dr. Edilia Bo on Monday, March 14. #2 tentatively we would do a left femoral to posterior tibial artery bypass on her on Tuesday if we are able to get some sort of cardiac testing prior to this. This is also dependent on whether or not she has adequate conduit and she will have a left leg vein mapping today. If we are unable to accomplish the above 3 we may need to defer her bypass. If she has inadequate conduit or inadequate runoff then continued follow-up at the wound care center would be the best plan for her. Also she would need to have her Plavix stopped today.  Fabienne Bruns, MD [facility] [place] Office: [phone] Pager: [phone]  Addendum: Patient had a left greater saphenous vein mapping today which shows the vein is 2 mm in diameter from the mid to distal calf from the proximal calf to the groin is 3-4 mm in diameter.  This should at least be reasonable conduit for a composite graft if necessary and hopefully will be long enough for the entire bypass. Her stress test is scheduled for tomorrow. Plan is for arteriogram Monday and bypass Tuesday as long as her cardiac stress test is reasonable. She will stop her Plavix today.  Fabienne Bruns, MD [facility] [place] Office: [phone] Pager: [phone]

## 2015-03-06 ENCOUNTER — Inpatient Hospital Stay (HOSPITAL_COMMUNITY): Payer: Medicare Other

## 2015-03-06 ENCOUNTER — Encounter (HOSPITAL_COMMUNITY): Payer: Self-pay | Admitting: Anesthesiology

## 2015-03-06 ENCOUNTER — Inpatient Hospital Stay (HOSPITAL_COMMUNITY): Payer: Medicare Other | Admitting: Anesthesiology

## 2015-03-06 ENCOUNTER — Inpatient Hospital Stay (HOSPITAL_COMMUNITY): Admission: RE | Admit: 2015-03-06 | Payer: Medicare Other | Source: Ambulatory Visit | Admitting: Vascular Surgery

## 2015-03-06 ENCOUNTER — Encounter (HOSPITAL_COMMUNITY)
Admission: RE | Disposition: A | Discharge status: Skilled Nursing Facility | Payer: Self-pay | Source: Ambulatory Visit | Attending: Vascular Surgery

## 2015-03-06 HISTORY — PX: FEMORAL-TIBIAL BYPASS GRAFT: SHX938

## 2015-03-06 HISTORY — PX: INTRAOPERATIVE ARTERIOGRAM: SHX5157

## 2015-03-06 LAB — CBC
HCT: 35.2 % — ABNORMAL LOW (ref 36.0–46.0)
HEMOGLOBIN: 11.3 g/dL — AB (ref 12.0–15.0)
MCH: 31.5 pg (ref 26.0–34.0)
MCHC: 32.1 g/dL (ref 30.0–36.0)
MCV: 98.1 fL (ref 78.0–100.0)
Platelets: 231 10*3/uL (ref 150–400)
RBC: 3.59 MIL/uL — ABNORMAL LOW (ref 3.87–5.11)
RDW: 12.9 % (ref 11.5–15.5)
WBC: 9.3 10*3/uL (ref 4.0–10.5)

## 2015-03-06 LAB — BASIC METABOLIC PANEL
Anion gap: 7 (ref 5–15)
BUN: 20 mg/dL (ref 6–23)
CALCIUM: 8.7 mg/dL (ref 8.4–10.5)
CO2: 23 mmol/L (ref 19–32)
CREATININE: 0.96 mg/dL (ref 0.50–1.10)
Chloride: 109 mmol/L (ref 96–112)
GFR calc Af Amer: 70 mL/min — ABNORMAL LOW (ref >90)
GFR, EST NON AFRICAN AMERICAN: 60 mL/min — AB (ref >90)
GLUCOSE: 110 mg/dL — AB (ref 70–99)
Potassium: 4 mmol/L (ref 3.5–5.1)
SODIUM: 139 mmol/L (ref 135–145)

## 2015-03-06 LAB — GLUCOSE, CAPILLARY
GLUCOSE-CAPILLARY: 141 mg/dL — AB (ref 70–99)
Glucose-Capillary: 218 mg/dL — ABNORMAL HIGH (ref 70–99)

## 2015-03-06 LAB — MRSA PCR SCREENING: MRSA BY PCR: NEGATIVE

## 2015-03-06 SURGERY — CREATION, BYPASS, ARTERIAL, FEMORAL TO TIBIAL, USING GRAFT
Anesthesia: General | Site: Leg Upper | Laterality: Left

## 2015-03-06 MED ORDER — PAPAVERINE HCL 30 MG/ML IJ SOLN
INTRAMUSCULAR | Status: AC
  Filled 2015-03-06: qty 2

## 2015-03-06 MED ORDER — FENTANYL CITRATE 0.05 MG/ML IJ SOLN
INTRAMUSCULAR | Status: AC
  Filled 2015-03-06: qty 5

## 2015-03-06 MED ORDER — PHENYLEPHRINE HCL 10 MG/ML IJ SOLN
10.0000 mg | INTRAVENOUS | Status: DC | PRN
  Administered 2015-03-06: 40 ug/min via INTRAVENOUS

## 2015-03-06 MED ORDER — SODIUM CHLORIDE 0.9 % IV SOLN
INTRAVENOUS | Status: DC
  Administered 2015-03-06: 20:00:00 via INTRAVENOUS
  Administered 2015-03-06: 125 mL/h via INTRAVENOUS

## 2015-03-06 MED ORDER — IOHEXOL 300 MG/ML  SOLN
INTRAMUSCULAR | Status: DC | PRN
  Administered 2015-03-06: 21 mL via INTRAVENOUS

## 2015-03-06 MED ORDER — FENTANYL CITRATE 0.05 MG/ML IJ SOLN
INTRAMUSCULAR | Status: AC
  Filled 2015-03-06: qty 2

## 2015-03-06 MED ORDER — POTASSIUM CHLORIDE CRYS ER 20 MEQ PO TBCR: 20.0000 meq | EXTENDED_RELEASE_TABLET | Freq: Every day | ORAL | Status: DC | PRN

## 2015-03-06 MED ORDER — SODIUM CHLORIDE 0.9 % IJ SOLN
INTRAMUSCULAR | Status: AC
  Filled 2015-03-06: qty 10

## 2015-03-06 MED ORDER — DEXAMETHASONE SODIUM PHOSPHATE 4 MG/ML IJ SOLN
INTRAMUSCULAR | Status: AC
  Filled 2015-03-06: qty 2

## 2015-03-06 MED ORDER — PANTOPRAZOLE SODIUM 40 MG PO TBEC
40.0000 mg | DELAYED_RELEASE_TABLET | Freq: Every day | ORAL | Status: DC
Start: 2015-03-06 — End: 2015-03-10
  Administered 2015-03-06 – 2015-03-10 (×5): 40 mg via ORAL
  Filled 2015-03-06 (×5): qty 1

## 2015-03-06 MED ORDER — MIDAZOLAM HCL 5 MG/5ML IJ SOLN
INTRAMUSCULAR | Status: DC | PRN
  Administered 2015-03-06 (×2): 1 mg via INTRAVENOUS

## 2015-03-06 MED ORDER — ARTIFICIAL TEARS OP OINT
TOPICAL_OINTMENT | OPHTHALMIC | Status: AC
  Filled 2015-03-06: qty 3.5

## 2015-03-06 MED ORDER — EPHEDRINE SULFATE 50 MG/ML IJ SOLN
INTRAMUSCULAR | Status: AC
  Filled 2015-03-06: qty 1

## 2015-03-06 MED ORDER — MORPHINE SULFATE 2 MG/ML IJ SOLN
2.0000 mg | INTRAMUSCULAR | Status: DC | PRN
  Administered 2015-03-06 – 2015-03-07 (×3): 4 mg via INTRAVENOUS
  Filled 2015-03-06: qty 1
  Filled 2015-03-06 (×3): qty 2

## 2015-03-06 MED ORDER — DEXTROSE 5 % IV SOLN
INTRAVENOUS | Status: DC | PRN
  Administered 2015-03-06: 12:00:00 via INTRAVENOUS

## 2015-03-06 MED ORDER — DEXAMETHASONE SODIUM PHOSPHATE 4 MG/ML IJ SOLN
INTRAMUSCULAR | Status: DC | PRN
  Administered 2015-03-06: 8 mg via INTRAVENOUS

## 2015-03-06 MED ORDER — ROCURONIUM BROMIDE 50 MG/5ML IV SOLN
INTRAVENOUS | Status: AC
  Filled 2015-03-06: qty 1

## 2015-03-06 MED ORDER — FENTANYL CITRATE 0.05 MG/ML IJ SOLN
INTRAMUSCULAR | Status: DC | PRN
  Administered 2015-03-06: 50 ug via INTRAVENOUS
  Administered 2015-03-06: 100 ug via INTRAVENOUS
  Administered 2015-03-06 (×6): 50 ug via INTRAVENOUS

## 2015-03-06 MED ORDER — MAGNESIUM SULFATE 2 GM/50ML IV SOLN
2.0000 g | Freq: Every day | INTRAVENOUS | Status: DC | PRN
  Filled 2015-03-06: qty 50

## 2015-03-06 MED ORDER — NEOSTIGMINE METHYLSULFATE 10 MG/10ML IV SOLN
INTRAVENOUS | Status: DC | PRN
  Administered 2015-03-06: 4 mg via INTRAVENOUS

## 2015-03-06 MED ORDER — SODIUM CHLORIDE 0.9 % IV SOLN
500.0000 mL | Freq: Once | INTRAVENOUS | Status: AC | PRN
  Administered 2015-03-06 (×2): 500 mL via INTRAVENOUS

## 2015-03-06 MED ORDER — THROMBIN 20000 UNITS EX SOLR
CUTANEOUS | Status: AC
  Filled 2015-03-06: qty 20000

## 2015-03-06 MED ORDER — LACTATED RINGERS IV SOLN
INTRAVENOUS | Status: DC | PRN
  Administered 2015-03-06 (×4): via INTRAVENOUS

## 2015-03-06 MED ORDER — LABETALOL HCL 5 MG/ML IV SOLN
10.0000 mg | INTRAVENOUS | Status: DC | PRN
  Filled 2015-03-06: qty 4

## 2015-03-06 MED ORDER — MIDAZOLAM HCL 2 MG/2ML IJ SOLN
INTRAMUSCULAR | Status: AC
  Filled 2015-03-06: qty 2

## 2015-03-06 MED ORDER — LIDOCAINE HCL (CARDIAC) 20 MG/ML IV SOLN
INTRAVENOUS | Status: AC
  Filled 2015-03-06: qty 5

## 2015-03-06 MED ORDER — EPHEDRINE SULFATE 50 MG/ML IJ SOLN
INTRAMUSCULAR | Status: DC | PRN
  Administered 2015-03-06: 10 mg via INTRAVENOUS

## 2015-03-06 MED ORDER — PHENOL 1.4 % MT LIQD: 1.0000 | OROMUCOSAL | Status: DC | PRN

## 2015-03-06 MED ORDER — HEPARIN SODIUM (PORCINE) 1000 UNIT/ML IJ SOLN
INTRAMUSCULAR | Status: AC
  Filled 2015-03-06: qty 1

## 2015-03-06 MED ORDER — PROPOFOL 10 MG/ML IV BOLUS
INTRAVENOUS | Status: AC
  Filled 2015-03-06: qty 20

## 2015-03-06 MED ORDER — PROPOFOL 10 MG/ML IV BOLUS
INTRAVENOUS | Status: DC | PRN
  Administered 2015-03-06: 20 mg via INTRAVENOUS
  Administered 2015-03-06: 100 mg via INTRAVENOUS
  Administered 2015-03-06: 10 mg via INTRAVENOUS
  Administered 2015-03-06: 20 mg via INTRAVENOUS
  Administered 2015-03-06: 30 mg via INTRAVENOUS
  Administered 2015-03-06: 20 mg via INTRAVENOUS

## 2015-03-06 MED ORDER — HEPARIN SODIUM (PORCINE) 1000 UNIT/ML IJ SOLN
INTRAMUSCULAR | Status: DC | PRN
  Administered 2015-03-06 (×2): 8000 [IU] via INTRAVENOUS

## 2015-03-06 MED ORDER — GLYCOPYRROLATE 0.2 MG/ML IJ SOLN
INTRAMUSCULAR | Status: AC
  Filled 2015-03-06: qty 3

## 2015-03-06 MED ORDER — LIDOCAINE HCL (CARDIAC) 20 MG/ML IV SOLN
INTRAVENOUS | Status: DC | PRN
  Administered 2015-03-06: 100 mg via INTRAVENOUS

## 2015-03-06 MED ORDER — HYDRALAZINE HCL 20 MG/ML IJ SOLN: 5.0000 mg | INTRAMUSCULAR | Status: DC | PRN

## 2015-03-06 MED ORDER — FENTANYL CITRATE 0.05 MG/ML IJ SOLN
25.0000 ug | INTRAMUSCULAR | Status: DC | PRN
  Administered 2015-03-06 (×4): 25 ug via INTRAVENOUS

## 2015-03-06 MED ORDER — NEOSTIGMINE METHYLSULFATE 10 MG/10ML IV SOLN
INTRAVENOUS | Status: AC
  Filled 2015-03-06: qty 1

## 2015-03-06 MED ORDER — GLYCOPYRROLATE 0.2 MG/ML IJ SOLN
INTRAMUSCULAR | Status: DC | PRN
  Administered 2015-03-06: 0.6 mg via INTRAVENOUS

## 2015-03-06 MED ORDER — DOCUSATE SODIUM 100 MG PO CAPS
100.0000 mg | ORAL_CAPSULE | Freq: Every day | ORAL | Status: DC
  Administered 2015-03-07 – 2015-03-10 (×4): 100 mg via ORAL
  Filled 2015-03-06 (×4): qty 1

## 2015-03-06 MED ORDER — GUAIFENESIN-DM 100-10 MG/5ML PO SYRP: 15.0000 mL | ORAL_SOLUTION | ORAL | Status: DC | PRN

## 2015-03-06 MED ORDER — ROCURONIUM BROMIDE 100 MG/10ML IV SOLN
INTRAVENOUS | Status: DC | PRN
  Administered 2015-03-06: 35 mg via INTRAVENOUS
  Administered 2015-03-06: 20 mg via INTRAVENOUS
  Administered 2015-03-06 (×3): 15 mg via INTRAVENOUS

## 2015-03-06 MED ORDER — SENNOSIDES-DOCUSATE SODIUM 8.6-50 MG PO TABS
1.0000 | ORAL_TABLET | Freq: Every evening | ORAL | Status: DC | PRN
Start: 2015-03-06 — End: 2015-03-10
  Administered 2015-03-08: 1 via ORAL
  Filled 2015-03-06 (×2): qty 1

## 2015-03-06 MED ORDER — ONDANSETRON HCL 4 MG/2ML IJ SOLN
INTRAMUSCULAR | Status: DC | PRN
  Administered 2015-03-06: 4 mg via INTRAVENOUS

## 2015-03-06 MED ORDER — ALUM & MAG HYDROXIDE-SIMETH 200-200-20 MG/5ML PO SUSP: 15.0000 mL | ORAL | Status: DC | PRN

## 2015-03-06 MED ORDER — BISACODYL 10 MG RE SUPP: 10.0000 mg | Freq: Every day | RECTAL | Status: DC | PRN

## 2015-03-06 MED ORDER — PROTAMINE SULFATE 10 MG/ML IV SOLN
INTRAVENOUS | Status: DC | PRN
  Administered 2015-03-06 (×8): 10 mg via INTRAVENOUS

## 2015-03-06 MED ORDER — MEPERIDINE HCL 25 MG/ML IJ SOLN: 6.2500 mg | INTRAMUSCULAR | Status: DC | PRN

## 2015-03-06 MED ORDER — PROTAMINE SULFATE 10 MG/ML IV SOLN
INTRAVENOUS | Status: AC
  Filled 2015-03-06: qty 10

## 2015-03-06 MED ORDER — 0.9 % SODIUM CHLORIDE (POUR BTL) OPTIME
TOPICAL | Status: DC | PRN
  Administered 2015-03-06 (×3): 1000 mL

## 2015-03-06 MED ORDER — DEXTROSE 5 % IV SOLN
INTRAVENOUS | Status: DC | PRN
  Administered 2015-03-06: 08:00:00 via INTRAVENOUS

## 2015-03-06 MED ORDER — ONDANSETRON HCL 4 MG/2ML IJ SOLN
INTRAMUSCULAR | Status: AC
  Filled 2015-03-06: qty 2

## 2015-03-06 MED ORDER — METOPROLOL TARTRATE 1 MG/ML IV SOLN: 2.0000 mg | INTRAVENOUS | Status: DC | PRN

## 2015-03-06 MED ORDER — PROMETHAZINE HCL 25 MG/ML IJ SOLN: 6.2500 mg | INTRAMUSCULAR | Status: DC | PRN

## 2015-03-06 MED ORDER — SODIUM CHLORIDE 0.9 % IR SOLN
Status: DC | PRN
  Administered 2015-03-06: 500 mL

## 2015-03-06 SURGICAL SUPPLY — 63 items
BANDAGE ESMARK 6X9 LF (GAUZE/BANDAGES/DRESSINGS) IMPLANT
BNDG CMPR 9X6 STRL LF SNTH (GAUZE/BANDAGES/DRESSINGS)
BNDG ESMARK 6X9 LF (GAUZE/BANDAGES/DRESSINGS)
CANISTER SUCTION 2500CC (MISCELLANEOUS) ×3 IMPLANT
CANNULA VESSEL 3MM 2 BLNT TIP (CANNULA) ×3 IMPLANT
CLIP TI MEDIUM 24 (CLIP) ×3 IMPLANT
CLIP TI WIDE RED SMALL 24 (CLIP) ×4 IMPLANT
CUFF TOURNIQUET SINGLE 24IN (TOURNIQUET CUFF) IMPLANT
CUFF TOURNIQUET SINGLE 34IN LL (TOURNIQUET CUFF) IMPLANT
CUFF TOURNIQUET SINGLE 44IN (TOURNIQUET CUFF) IMPLANT
DRAIN SNY WOU (WOUND CARE) IMPLANT
DRAPE X-RAY CASS 24X20 (DRAPES) ×1 IMPLANT
DRSG COVADERM 4X14 (GAUZE/BANDAGES/DRESSINGS) ×3 IMPLANT
DRSG COVADERM 4X6 (GAUZE/BANDAGES/DRESSINGS) ×1 IMPLANT
ELECT REM PT RETURN 9FT ADLT (ELECTROSURGICAL) ×3
ELECTRODE REM PT RTRN 9FT ADLT (ELECTROSURGICAL) ×2 IMPLANT
EVACUATOR SILICONE 100CC (DRAIN) IMPLANT
GAUZE SPONGE 4X4 16PLY XRAY LF (GAUZE/BANDAGES/DRESSINGS) ×2 IMPLANT
GLOVE BIO SURGEON STRL SZ7.5 (GLOVE) ×6 IMPLANT
GLOVE BIOGEL PI IND STRL 6.5 (GLOVE) IMPLANT
GLOVE BIOGEL PI IND STRL 7.5 (GLOVE) IMPLANT
GLOVE BIOGEL PI IND STRL 8 (GLOVE) IMPLANT
GLOVE BIOGEL PI INDICATOR 6.5 (GLOVE) ×5
GLOVE BIOGEL PI INDICATOR 7.5 (GLOVE) ×4
GLOVE BIOGEL PI INDICATOR 8 (GLOVE) ×2
GLOVE ECLIPSE 6.5 STRL STRAW (GLOVE) ×4 IMPLANT
GLOVE ECLIPSE 7.0 STRL STRAW (GLOVE) ×2 IMPLANT
GOWN BRE IMP SLV AUR XL STRL (GOWN DISPOSABLE) ×2 IMPLANT
GOWN STRL REUS W/ TWL LRG LVL3 (GOWN DISPOSABLE) ×6 IMPLANT
GOWN STRL REUS W/TWL LRG LVL3 (GOWN DISPOSABLE) ×24
KIT BASIN OR (CUSTOM PROCEDURE TRAY) ×3 IMPLANT
KIT ROOM TURNOVER OR (KITS) ×3 IMPLANT
LIQUID BAND (GAUZE/BANDAGES/DRESSINGS) ×3 IMPLANT
LOOP VESSEL MINI RED (MISCELLANEOUS) ×2 IMPLANT
NS IRRIG 1000ML POUR BTL (IV SOLUTION) ×6 IMPLANT
PACK PERIPHERAL VASCULAR (CUSTOM PROCEDURE TRAY) ×3 IMPLANT
PAD ARMBOARD 7.5X6 YLW CONV (MISCELLANEOUS) ×6 IMPLANT
PADDING CAST COTTON 6X4 STRL (CAST SUPPLIES) IMPLANT
SET COLLECT BLD 21X3/4 12 (NEEDLE) ×1 IMPLANT
SPONGE LAP 18X18 X RAY DECT (DISPOSABLE) ×2 IMPLANT
SPONGE SURGIFOAM ABS GEL 100 (HEMOSTASIS) IMPLANT
STAPLER VISISTAT 35W (STAPLE) ×4 IMPLANT
STOPCOCK 4 WAY LG BORE MALE ST (IV SETS) ×1 IMPLANT
SUT ETHILON 3 0 FSL (SUTURE) ×1 IMPLANT
SUT PROLENE 5 0 C 1 24 (SUTURE) ×3 IMPLANT
SUT PROLENE 6 0 CC (SUTURE) ×4 IMPLANT
SUT PROLENE 7 0 BV 1 (SUTURE) ×3 IMPLANT
SUT PROLENE 7 0 BV1 MDA (SUTURE) IMPLANT
SUT SILK 2 0 FS (SUTURE) ×3 IMPLANT
SUT SILK 2 0 SH (SUTURE) ×3 IMPLANT
SUT SILK 3 0 (SUTURE) ×9
SUT SILK 3-0 18XBRD TIE 12 (SUTURE) IMPLANT
SUT SILK 4 0 (SUTURE) ×3
SUT SILK 4-0 18XBRD TIE 12 (SUTURE) IMPLANT
SUT VIC AB 2-0 CTX 36 (SUTURE) ×6 IMPLANT
SUT VIC AB 3-0 SH 27 (SUTURE) ×24
SUT VIC AB 3-0 SH 27X BRD (SUTURE) ×4 IMPLANT
SUT VICRYL 4-0 PS2 18IN ABS (SUTURE) ×6 IMPLANT
TAPE UMBILICAL COTTON 1/8X30 (MISCELLANEOUS) IMPLANT
TRAY FOLEY CATH 16FRSI W/METER (SET/KITS/TRAYS/PACK) ×3 IMPLANT
TUBING EXTENTION W/L.L. (IV SETS) IMPLANT
UNDERPAD 30X30 INCONTINENT (UNDERPADS AND DIAPERS) ×3 IMPLANT
WATER STERILE IRR 1000ML POUR (IV SOLUTION) ×3 IMPLANT

## 2015-03-06 NOTE — Clinical Documentation Improvement (Signed)
Patient with PVD, non healing left foot wound. Aortogram/arteriogram performed for possible consideration for revascularization. Patient is Diabetic.  Please clarify if the patient has any diabetic manifestations linked to her non healing foot wound and PVD.   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

## 2015-03-06 NOTE — Op Note (Signed)
Procedure: Left femoral to posterior tibial bypass using reversed ipsilateral greater saphenous vein, completion arteriogram  Preoperative diagnosis: Nonhealing wound left foot  Postoperative diagnosis: Same  Anesthesia: Gen.  Assistant: Lianne Cure PA-C  Operative findings: #1 fairly uniform diameter 2.5 mm greater saphenous vein #2 small tibial vessels posterior tibial artery approximately 1.5 or less millimeters in diameter  Operative details: Obtain informed consent, the patient was taken to the operating room. The patient was placed in supine position on the operating table. After induction of general anesthesia and endotracheal intubation, patient's entire left lower extremity was prepped and draped in usual sterile fashion. Next a longitudinal incision was made in the left groin and carried down through subcutaneous tissues down to level left common femoral artery. This was dissected free circumferentially just underneath the inguinal ligament. 2 circumflex iliac branches were also dissected free circumferentially and vessel loops placed around these. The superficial femoral and profunda femoris arteries were dissected free circumferentially and vessel loops are placed around these. Next attention was turned toward the greater saphenous vein in the medial portion incision. This was dissected free circumferentially at the level of the saphenofemoral junction. Small side branches were ligated and divided between silk ties or clips. Proceeded through several skip incisions on the medial aspect of the left leg to harvest the greater saphenous vein all the way essentially to the level of the ankle. The vein was approximately 2-1/2 mm and fairly uniform in diameter throughout its course. At this point I decided to explore the patient's anterior tibial artery on the lateral aspect of the left leg just above the ankle. The vein at the level of the ankle was ligated with a 2-0 silk tie and divided. The  saphenofemoral junction was oversewn with a running 5-0 Prolene suture. This seems like a reasonable vessel on the arteriogram. An incision was made near the muscle belly of the anterior tibialis. Incision was carried down through the saphenous tissues and the muscles were reflected anteriorly and posteriorly to expose the anterior tibial artery. This is dissected free circumferentially. It was thickened on palpation. Vessel loops were placed proximal and distal. Attention was then turned back the other side of the leg. The second cutaneous tissues were incised with cautery and the below-knee popliteal space was entered. A tunnel was then created going between the heads of the gastrocnemius muscle up to the level of the left groin subsartorial. The patient was given 8000 units of intravenous heparin. It should be noted she was given an additional 8000 units of heparin during the course of the case. The left common femoral artery was controlled proximally with a vessel loop. The profunda femoris and superficial femoral arteries were also controlled with vessel loops. Longitudinal opening was made in the anterior surface of the left common femoral artery just above the bifurcation. There was some calcification but there was a reasonable quality lumen. The vein was placed in a reversed configuration and sewn end of vein to side of artery using a running 6-0 Prolene suture. At completion anastomosis everything was backbled and thoroughly flushed. There was good pulsatile flow in the vein. It was marked for orientation. He was brought through the subsartorial tunnel and then tunneled out across the anterior tibia over to the Angio tibial artery incision. Longitudinal opening was made in the anterior tibial artery. This was a severely diseased vessel. I was unable to pass even a 1 mm dilator proximally or distally in it and there is essentially no back or  for bleeding. Attempts to use this as the distal target vessel were  abandoned. The vessel was ligated proximal and distal to the area that had been opened in the artery. Attention was then turned back to the medial aspect of the leg. The incision for the saphenectomy was deepened approximate 7 cm above the ankle level. The muscles were reflected anteriorly and posteriorly and the posterior tibial artery was dissected free circumference with. This vessel was small but fairly soft on palpation. A longitudinal opening was made in the artery and there was some back and fore bleeding. The vein graft was cut to length. The vessel would not accept a 2 mm dilator but was patent. The graft was spatulated and sewn end of vein to side of artery using a running 6-0 Prolene suture. This part completion anastomosis was forebled backbled and thoroughly flushed. Anastomosis was secured clamps released there was pulsatile flow in the vein graft distally. There is good Doppler flow just below the anastomosis. However, I was unable to really hear good Doppler flow at the posterior tibial level or in the dorsalis pedis artery. An intraoperative arteriogram was performed introducing a 21-gauge butterfly needle into the vein graft just above the anastomosis. This was done with inflow occlusion. This shows a patent distal anastomosis with a very small posterior tibial artery but a patent anastomosis and filling of the posterior tibial peroneal and dorsalis pedis arteries via this bypass graft. The needle was removed and the hole repaired with single 6-0 Prolene suture. The patient was given 80 mg of protamine persistent hemostasis. All of the leg incisions were closed with running 3-0 Vicryl suture followed by staples in the skin. The left groin incision was closed in multiple layers of running 3-0 Vicryl suture followed by 4 Vicryl subcuticular stitch in the skin and Dermabond over this. The patient tolerated procedure well and there were no complications. The ansa and sponge and needle counts correct in  the case. The patient was taken the recovery room in stable condition. She still did not have Doppler signals at the end of the case despite having an arteriogram showing that he is vessels were patent. Hopefully the Doppler flow improve with warming after the patient reaches the recovery room. There was no technical defect in the distal anastomosis. If the bicarbonate pass graft occludes in the early phase then most likely no revision would be undertaken.  Fabienne Brunsharles Fields, MD [facility] [place] Office: [phone] Pager: [phone]

## 2015-03-06 NOTE — Progress Notes (Signed)
UR COMPLETED  

## 2015-03-06 NOTE — Interval H&P Note (Signed)
History and Physical Interval Note:  03/06/2015 7:24 AM  Laurie BrownieAnne W Rinella  has presented today for surgery, with the diagnosis of Peripheral vascular disease with nonhealing left foot wound I70.244  The various methods of treatment have been discussed with the patient and family. After consideration of risks, benefits and other options for treatment, the patient has consented to  Procedure(s): BYPASS GRAFT FEMORAL-POSTERIOR TIBIAL ARTERY (Left) as a surgical intervention .  The patient's history has been reviewed, patient examined, no change in status, stable for surgery.  I have reviewed the patient's chart and labs.  Questions were answered to the patient's satisfaction.     FIELDS,CHARLES E

## 2015-03-06 NOTE — Transfer of Care (Signed)
Immediate Anesthesia Transfer of Care Note  Patient: Laurie [name]  Procedure(s) Performed: Procedure(s): BYPASS GRAFT Left FEMORAL-POSTERIOR TIBIAL ARTERY with reversed saphenous vein (Left) INTRA OPERATIVE ARTERIOGRAM (Left)  Patient Location: PACU  Anesthesia Type:General  Level of Consciousness: awake, alert  and patient cooperative  Airway & Oxygen Therapy: Patient Spontanous Breathing, Patient connected to nasal cannula oxygen and Neuro PACU  Post-op Assessment: Report given to RN, Post -op Vital signs reviewed and stable, Patient moving all extremities and complains of back aching  Post vital signs: Reviewed and stable  Last Vitals:  Filed Vitals:   03/06/15 1439  BP:   Pulse:   Temp: 36.8 C  Resp:     Complications: No apparent anesthesia complications

## 2015-03-06 NOTE — Anesthesia Postprocedure Evaluation (Signed)
  Anesthesia Post-op Note  Patient: Laurie [name]  Procedure(s) Performed: Procedure(s): BYPASS GRAFT Left FEMORAL-POSTERIOR TIBIAL ARTERY with reversed saphenous vein (Left) INTRA OPERATIVE ARTERIOGRAM (Left)  Patient Location: PACU  Anesthesia Type:General  Level of Consciousness: awake  Airway and Oxygen Therapy: Patient Spontanous Breathing and Patient connected to nasal cannula oxygen  Post-op Pain: mild  Post-op Assessment: Post-op Vital signs reviewed, Patient's Cardiovascular Status Stable, Respiratory Function Stable and Patent Airway  Post-op Vital Signs: Reviewed and stable  Last Vitals:  Filed Vitals:   03/06/15 1439  BP:   Pulse:   Temp: 36.8 C  Resp:     Complications: No apparent anesthesia complications

## 2015-03-06 NOTE — Progress Notes (Signed)
Cut 3 rings off after patient give permission. 2 yellow colored and 1 grey colored. Ring placed in labeled bag are at bedside with Clydie BraunKaren, CRNA

## 2015-03-06 NOTE — Progress Notes (Signed)
    CC: lumbar pain  Left DP/PT doppler signal  Incisions covered with min. Bloody drainage No hematomas   S/P left Fem-posterior tibial bypass Disposition stable Plan to re-start Plavix tomorrow when Dr. Darrick PennaFields gives the [facility]  Arturo Sofranko [facility] PA-C

## 2015-03-06 NOTE — Anesthesia Preprocedure Evaluation (Addendum)
Anesthesia Evaluation  Patient identified by MRN, date of birth, ID band Patient awake    Reviewed: Allergy & Precautions, NPO status , Patient's Chart, lab work & pertinent test results  Airway Mallampati: II  TM Distance: >3 FB Neck ROM: Full    Dental  (+) Edentulous Upper, Dental Advisory Given   Pulmonary Current Smoker,  breath sounds clear to auscultation        Cardiovascular hypertension, Pt. on medications + Peripheral Vascular Disease Rhythm:Regular  ECHO 2014 EF 55-60%   Neuro/Psych Anxiety Depression Carotids 2014 49% stenosis bilat Left sided weakness CVA, Residual Symptoms    GI/Hepatic negative GI ROS, Neg liver ROS,   Endo/Other  diabetes, Well Controlled, Type 2, Insulin Dependent  Renal/GU negative Renal ROS     Musculoskeletal   Abdominal (+)  Abdomen: soft.    Peds  Hematology  (+) anemia , 12/35   Anesthesia Other Findings   Reproductive/Obstetrics                           Anesthesia Physical Anesthesia Plan  ASA: III  Anesthesia Plan: General   Post-op Pain Management:    Induction: Intravenous  Airway Management Planned: Oral ETT  Additional Equipment: Arterial line  Intra-op Plan:   Post-operative Plan:   Informed Consent: I have reviewed the patients History and Physical, chart, labs and discussed the procedure including the risks, benefits and alternatives for the proposed anesthesia with the patient or authorized representative who has indicated his/her understanding and acceptance.     Plan Discussed with:   Anesthesia Plan Comments:         Anesthesia Quick Evaluation

## 2015-03-06 NOTE — Anesthesia Procedure Notes (Signed)
Procedure Name: Intubation Date/Time: 03/06/2015 7:44 AM Performed by: Darcey NoraJAMES, Veronique Warga B Pre-anesthesia Checklist: Patient identified, Emergency Drugs available, Suction available and Patient being monitored Patient Re-evaluated:Patient Re-evaluated prior to inductionOxygen Delivery Method: Circle system utilized Preoxygenation: Pre-oxygenation with 100% oxygen Intubation Type: IV induction Ventilation: Mask ventilation without difficulty Laryngoscope Size: Mac and 3 Grade View: Grade II Tube type: Oral Tube size: 7.5 mm Number of attempts: 1 Airway Equipment and Method: Stylet Placement Confirmation: ETT inserted through vocal cords under direct vision,  breath sounds checked- equal and bilateral and positive ETCO2 Secured at: 21 (cm at upper gum) cm Tube secured with: Tape Dental Injury: Teeth and Oropharynx as per pre-operative assessment

## 2015-03-07 LAB — GLUCOSE, CAPILLARY
GLUCOSE-CAPILLARY: 165 mg/dL — AB (ref 70–99)
GLUCOSE-CAPILLARY: 144 mg/dL — AB (ref 70–99)
Glucose-Capillary: 161 mg/dL — ABNORMAL HIGH (ref 70–99)
Glucose-Capillary: 140 mg/dL — ABNORMAL HIGH (ref 70–99)

## 2015-03-07 LAB — BASIC METABOLIC PANEL
ANION GAP: 7 (ref 5–15)
BUN: 18 mg/dL (ref 6–23)
CHLORIDE: 107 mmol/L (ref 96–112)
CO2: 22 mmol/L (ref 19–32)
Calcium: 7.9 mg/dL — ABNORMAL LOW (ref 8.4–10.5)
Creatinine, Ser: 0.9 mg/dL (ref 0.50–1.10)
GFR calc Af Amer: 76 mL/min — ABNORMAL LOW (ref >90)
GFR calc non Af Amer: 65 mL/min — ABNORMAL LOW (ref >90)
GLUCOSE: 222 mg/dL — AB (ref 70–99)
POTASSIUM: 4.5 mmol/L (ref 3.5–5.1)
Sodium: 136 mmol/L (ref 135–145)

## 2015-03-07 LAB — ABO/RH: ABO/RH(D): A POS

## 2015-03-07 LAB — CBC
HCT: 24.6 % — ABNORMAL LOW (ref 36.0–46.0)
HEMOGLOBIN: 8 g/dL — AB (ref 12.0–15.0)
MCH: 32 pg (ref 26.0–34.0)
MCHC: 32.5 g/dL (ref 30.0–36.0)
MCV: 98.4 fL (ref 78.0–100.0)
PLATELETS: 179 10*3/uL (ref 150–400)
RBC: 2.5 MIL/uL — ABNORMAL LOW (ref 3.87–5.11)
RDW: 12.7 % (ref 11.5–15.5)
WBC: 10.4 10*3/uL (ref 4.0–10.5)

## 2015-03-07 LAB — PREPARE RBC (CROSSMATCH)

## 2015-03-07 MED ORDER — FUROSEMIDE 10 MG/ML IJ SOLN
20.0000 mg | Freq: Once | INTRAMUSCULAR | Status: AC
  Administered 2015-03-07: 20 mg via INTRAVENOUS
  Filled 2015-03-07: qty 2

## 2015-03-07 MED ORDER — SODIUM CHLORIDE 0.9 % IV SOLN
Freq: Once | INTRAVENOUS | Status: AC
  Administered 2015-03-07: 14:00:00 via INTRAVENOUS

## 2015-03-07 NOTE — Progress Notes (Signed)
PT Cancellation Note  Patient Details Name: Druscilla Brownienne W Urwin MRN: [id number] DOB: 1946-05-15   Cancelled Treatment:    Reason Eval/Treat Not Completed: Patient not medically ready.  Per RN, pt just starting 1st of 2 units PRBCs and was having trouble with incision drainage earlier when they dangled EOB.  Will hold PT at this time and f/u as appropriate.     Uilani Sanville, Alison MurrayMegan F 03/07/2015, 2:28 PM

## 2015-03-07 NOTE — Progress Notes (Signed)
Report called to 2W. Pt's VSS, pain med given, all personal belongings with pt. CCMD and Elink notified, pt transferred on monitor to 2W24.

## 2015-03-07 NOTE — Care Management Note (Unsigned)
    Page 1 of 1   03/07/2015     11:21:54 AM CARE MANAGEMENT NOTE 03/07/2015  Patient:  [name],Laurie [name]   Account Number:  [id number]  Date Initiated:  03/06/2015  Documentation initiated by:  COLE,ANGELA  Subjective/Objective Assessment:   PTA from  home. Admitted with nonhealing (l) foot ulcer.S/P (l) fem-pop. bypass graft 3/15. CM to f/u with d/c needs.     Action/Plan:   Return to  home when medically stable.   Anticipated DC Date:  03/07/2015   Anticipated DC Plan:  SKILLED NURSING FACILITY      DC Planning Services  CM consult      Choice offered to / List presented to:             Status of service:  In process, will continue to follow Medicare Important Message given?  NA - LOS <3 / Initial given by admissions (If response is "NO", the following Medicare IM given date fields will be blank) Date Medicare IM given:   Medicare IM given by:   Date Additional Medicare IM given:   Additional Medicare IM given by:    Discharge Disposition:    Per UR Regulation:  Reviewed for med. necessity/level of care/duration of stay  If discussed at Long Length of Stay Meetings, dates discussed:    Comments:  03/07/2015 @ 11:00 Gae GallopAngela Cole  RN,BSN,CM CSW spoke with CM regarding pt"s living arranngement. CSW stated pt lives alone and sister assist with care. Sister works @ a group home and pt accompanies sister to work from 9am-6pm. Pt daughter has expressed to CSW possible need for SNF placement. CM will continue to follow if needed.

## 2015-03-07 NOTE — Clinical Social Work Psychosocial (Signed)
Clinical Social Work Department BRIEF PSYCHOSOCIAL ASSESSMENT 03/07/2015  Patient:  Laurie [name], Laurie [name]     Account Number:  [id number]     Admit date:  03/05/2015  Clinical Social Worker:  Marciano Sequin  Date/Time:  03/07/2015 11:32 AM  Referred by:  RN  Date Referred:  03/07/2015 Referred for  SNF Placement   Other Referral:   Interview type:  Patient Other interview type:    PSYCHOSOCIAL DATA Living Status:  ALONE Admitted from facility:   Level of care:   Primary support name:  Varma,[name] & Williamson,Velma Primary support relationship to patient:  CHILD, ADULT Degree of support available:   Fair Support    CURRENT CONCERNS Current Concerns  None Noted   Other Concerns:    SOCIAL WORK ASSESSMENT / PLAN CSW met the pt at the bedside (pt's daughter [name] and the pt's sister Velma via phone).  CSW introduced self and purpose of the visit/call. CSW and family discussed discharge disposition. Velma reported that the pt lives alone, but she has been taking care of her while [name] at work. Since Velma work at [employer] arrangement were made for pt to receive care at [facility] while [place] works. [name] expressed gratitude for the help provided by [facility]. [name] reported feeling overwhelm do to the pt's current diagnose. [name] reported feelings as though she would like more professional help with the pt. [name] reported the pt did receive services from [facility] from November to January. CSW explained the SNF rehab process to the pt. CSW and pt discussed insurance and its relation to SNF rehab. CSW emailed [name] a SNF list. CSW contacted Suanne Marker at [facility] to determine if the pt meet for her 52 day wellness. The pt reported that she prefer to go back to [facility]. The pt reported that she will not go back to [facility]. The family reported needing time to discuss the matter. CSW answered all questions in which the family inquired  about. CSW provided family with contact information for further questions. CSW will continue to follow this pt and assist with discharge as needed.   Assessment/plan status:  Psychosocial Support/Ongoing Assessment of Needs Other assessment/ plan:   Information/referral to community resources:    PATIENT'S/FAMILY'S RESPONSE TO PLAN OF CARE: Pt reported needing to talk more with her daughter [name] regarding her discharge disposition.    [place], MSW, [place]

## 2015-03-07 NOTE — Progress Notes (Signed)
[facility]  Subjective  - no complaints   Objective 142/55 108 98.8 F (37.1 C) (Oral) 13 97%  Intake/Output Summary (Last 24 hours) at 03/07/15 0827 Last data filed at 03/07/15 0819  Gross per 24 hour  Intake   5445 ml  Output   2365 ml  Net   3080 ml   Left leg dressings dry currently, no obvious hematoma, brisk biphasic PT, monophasic DP doppler Mild tachycardia  Assessment/Planning: Mobilize today Transfer to 2w Transfuse in light of cardiac history acute blood loss anemia tachycardia which does not seem to be pain related  Chalee Hirota E 03/07/2015 8:27 AM --  Laboratory Lab Results:  Recent Labs  03/06/15 0524 03/07/15 0324  WBC 9.3 10.4  HGB 11.3* 8.0*  HCT 35.2* 24.6*  PLT 231 179   BMET  Recent Labs  03/06/15 0524 03/07/15 0324  NA 139 136  K 4.0 4.5  CL 109 107  CO2 23 22  GLUCOSE 110* 222*  BUN 20 18  CREATININE 0.96 0.90  CALCIUM 8.7 7.9*    COAG Lab Results  Component Value Date   INR 1.03 10/03/2014   INR 0.99 10/24/2013   INR 1.2 04/27/2009   No results found for: PTT

## 2015-03-08 ENCOUNTER — Encounter (HOSPITAL_COMMUNITY): Payer: Self-pay | Admitting: Vascular Surgery

## 2015-03-08 DIAGNOSIS — I739 Peripheral vascular disease, unspecified: Secondary | ICD-10-CM

## 2015-03-08 LAB — CBC
HCT: 26 % — ABNORMAL LOW (ref 36.0–46.0)
HEMOGLOBIN: 8.5 g/dL — AB (ref 12.0–15.0)
MCH: 31.6 pg (ref 26.0–34.0)
MCHC: 32.7 g/dL (ref 30.0–36.0)
MCV: 96.7 fL (ref 78.0–100.0)
Platelets: 175 10*3/uL (ref 150–400)
RBC: 2.69 MIL/uL — AB (ref 3.87–5.11)
RDW: 14.2 % (ref 11.5–15.5)
WBC: 10.3 10*3/uL (ref 4.0–10.5)

## 2015-03-08 LAB — BASIC METABOLIC PANEL
Anion gap: 8 (ref 5–15)
BUN: 20 mg/dL (ref 6–23)
CO2: 24 mmol/L (ref 19–32)
CREATININE: 0.98 mg/dL (ref 0.50–1.10)
Calcium: 8.7 mg/dL (ref 8.4–10.5)
Chloride: 104 mmol/L (ref 96–112)
GFR calc non Af Amer: 59 mL/min — ABNORMAL LOW (ref >90)
GFR, EST AFRICAN AMERICAN: 68 mL/min — AB (ref >90)
Glucose, Bld: 131 mg/dL — ABNORMAL HIGH (ref 70–99)
POTASSIUM: 4 mmol/L (ref 3.5–5.1)
Sodium: 136 mmol/L (ref 135–145)

## 2015-03-08 LAB — GLUCOSE, CAPILLARY
GLUCOSE-CAPILLARY: 117 mg/dL — AB (ref 70–99)
GLUCOSE-CAPILLARY: 144 mg/dL — AB (ref 70–99)
Glucose-Capillary: 121 mg/dL — ABNORMAL HIGH (ref 70–99)
Glucose-Capillary: 159 mg/dL — ABNORMAL HIGH (ref 70–99)

## 2015-03-08 NOTE — Progress Notes (Signed)
ANTIBIOTIC CONSULT NOTE - INITIAL  Pharmacy Consult for Keflex, Bactrim Indication: Non healing left foot wound   Allergies  Allergen Reactions  . Penicillins Hives    Childhood allergy could have been more reaction only remembers hives.     Patient Measurements: Height: 5\' 7"  (170.2 cm) Weight: 195 lb 8.8 oz (88.7 kg) IBW/kg (Calculated) : 61.6  Vital Signs: Temp: 98.6 F (37 C) (03/17 0436) Temp Source: Oral (03/17 0436) BP: 114/89 mmHg (03/17 1056) Pulse Rate: 106 (03/17 0436) Intake/Output from previous day: 03/16 0701 - 03/17 0700 In: 335 [Blood:335] Out: 200 [Urine:200] Intake/Output from this shift: Total I/O In: 120 [P.O.:120] Out: -   Labs:  Recent Labs  03/05/15 1504 03/06/15 0524 03/07/15 0324  WBC 7.8 9.3 10.4  HGB 11.8* 11.3* 8.0*  PLT 261 231 179  CREATININE 0.92 0.96 0.90   Estimated Creatinine Clearance: 70.3 mL/min (by C-G formula based on Cr of 0.9). No results for input(s): VANCOTROUGH, VANCOPEAK, VANCORANDOM, GENTTROUGH, GENTPEAK, GENTRANDOM, TOBRATROUGH, TOBRAPEAK, TOBRARND, AMIKACINPEAK, AMIKACINTROU, AMIKACIN in the last 72 hours.   Microbiology: Recent Results (from the past 720 hour(s))  MRSA PCR Screening     Status: None   Collection Time: 03/06/15  7:00 PM  Result Value Ref Range Status   MRSA by PCR NEGATIVE NEGATIVE Final    Comment:        The GeneXpert MRSA Assay (FDA approved for NASAL specimens only), is one component of a comprehensive MRSA colonization surveillance program. It is not intended to diagnose MRSA infection nor to guide or monitor treatment for MRSA infections.     Medical History: Past Medical History  Diagnosis Date  . Hypertension   . Chest pain     Troponins up to 0.12 while hospitalized for E.coli PNA  . 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 Bacteremia   . Anemia   . Narcolepsy   . Family history of adverse reaction to anesthesia     "brother died 06/24/2013; woke up  w/headache, vomited blood; drilled holes in his head; bleeding stroke; never came back" (03/05/2015)  . High cholesterol   . Type II diabetes mellitus   . Walking pneumonia 04/2009    "double" Hattie Perch/notes 04/23/2011  . Stroke 10/2014    "weaker on left side since" (03/05/2015)  . Headache     "when someone gets on my nerves" (03/05/2015)  . Bursitis     "left hip; sometimes left shoulder" (03/05/2015)  . Anxiety   . Depression     Medications:  Prescriptions prior to admission  Medication Sig Dispense Refill Last Dose  . ALPRAZolam (XANAX) 0.25 MG tablet Take one tablet by mouth twice daily as needed for anxiety (Patient taking differently: Take 0.25 mg by mouth 2 (two) times daily. Take one tablet by mouth twice daily as needed for anxiety) 60 tablet 2 03/05/2015 at Unknown time  . amphetamine-dextroamphetamine (ADDERALL) 10 MG tablet Take 10 mg by mouth 2 (two) times daily with a meal.   03/05/2015 at Unknown time  . aspirin EC 81 MG tablet Take 81 mg by mouth daily.   03/05/2015 at Unknown time  . atorvastatin (LIPITOR) 10 MG tablet Take 1 tablet (10 mg total) by mouth daily at 6 PM. 30 tablet 1 03/04/2015 at Unknown time  . cephALEXin (KEFLEX) 500 MG capsule Take 1 capsule (500 mg total) by mouth 3 (three) times daily. 30 capsule 2 03/05/2015 at Unknown time  . Cholecalciferol (VITAMIN  D3 PO) Take 1 tablet by mouth daily.   03/05/2015 at Unknown time  . cilostazol (PLETAL) 100 MG tablet Take 1 tablet (100 mg total) by mouth 2 (two) times daily. 60 tablet 3 03/05/2015 at Unknown time  . citalopram (CELEXA) 10 MG tablet Take 10 mg by mouth daily.   03/05/2015 at Unknown time  . Coenzyme Q10 (COQ10 PO) Take 1 capsule by mouth daily.   03/05/2015 at Unknown time  . DULoxetine (CYMBALTA) 60 MG capsule Take 60 mg by mouth daily.   03/05/2015 at Unknown time  . gabapentin (NEURONTIN) 300 MG capsule Take 1 capsule (300 mg total) by mouth 2 (two) times daily. (Patient taking differently: Take 300 mg by mouth 3  (three) times daily. ) 60 capsule 1 03/05/2015 at Unknown time  . hydrochlorothiazide (MICROZIDE) 12.5 MG capsule Take 1 capsule (12.5 mg total) by mouth daily. 30 capsule 1 03/05/2015 at 0700  . HYDROcodone-acetaminophen (NORCO/VICODIN) 5-325 MG per tablet Take one tablet by mouth every 6 hours as needed for pain 120 tablet 0 03/05/2015 at Unknown time  . insulin glargine (LANTUS) 100 UNIT/ML injection Inject 0.2 mLs (20 Units total) into the skin at bedtime. 10 mL 11 03/04/2015 at Unknown time  . lisinopril (PRINIVIL,ZESTRIL) 20 MG tablet Take 1 tablet (20 mg total) by mouth daily. 30 tablet 1 03/05/2015 at 0700  . modafinil (PROVIGIL) 200 MG tablet Take one tablet by mouth twice daily for narcolepsy 60 tablet 0 03/05/2015 at Unknown time  . SANTYL ointment Apply 1 application topically daily.   2 03/04/2015 at Unknown time  . silver sulfADIAZINE (SILVADENE) 1 % cream Apply 1 application topically daily. 50 g 0 03/04/2015 at Unknown time  . sulfamethoxazole-trimethoprim (SEPTRA DS) 800-160 MG per tablet Take 1 tablet by mouth 2 (two) times daily. 28 tablet 0 03/05/2015 at Unknown time  . clopidogrel (PLAVIX) 75 MG tablet Take 75 mg by mouth daily.   03/01/2015   Assessment: 68 YOF currently on Keflex and bactrim for non healing left foot wound. She is s/p left Fem-posterial tibial bypass. POD#2. WBC remain wnl. Pt is afebrile. CrCl stable at 70 mL/min.   Goal of Therapy:  Resolution of infection   Plan:  -Continue Keflex 500 mg three times daily and Bactrim DS twice daily  -No further renal adjustments anticipated since renal fx is stable -Pharmacy will sign. Please feel free to re-consult if any additional help is needed.   Vinnie Level, PharmD., BCPS Clinical Pharmacist Pager [phone]

## 2015-03-08 NOTE — Progress Notes (Signed)
[facility]  Subjective  - no complaints   Objective 121/53 106 98.6 F (37 C) (Oral) 16 95%  Intake/Output Summary (Last 24 hours) at 03/08/15 16100828 Last data filed at 03/07/15 1930  Gross per 24 hour  Intake    335 ml  Output    200 ml  Net    135 ml   Some oozing from medial left thigh saphenectomy site, no hematoma Foot warm well perfused dorsal toe wounds still with fibrinous exudate   Assessment/Planning: Still has not been out of bed now POD 2 Avoid ACE on lower leg do we do not compromise her bypass Most likely ooze secondary to her plavix.  This is a subcutaneous incision from vein harvest and should stop.  No risk of arterial bleeding Need to get her out of bed.  She cannot go home until she is more mobile. Acute blood loss anemia.  Recheck CBC today post transfusion  Oswin Johal E 03/08/2015 8:28 AM --  Laboratory Lab Results:  Recent Labs  03/06/15 0524 03/07/15 0324  WBC 9.3 10.4  HGB 11.3* 8.0*  HCT 35.2* 24.6*  PLT 231 179   BMET  Recent Labs  03/06/15 0524 03/07/15 0324  NA 139 136  K 4.0 4.5  CL 109 107  CO2 23 22  GLUCOSE 110* 222*  BUN 20 18  CREATININE 0.96 0.90  CALCIUM 8.7 7.9*    COAG Lab Results  Component Value Date   INR 1.03 10/03/2014   INR 0.99 10/24/2013   INR 1.2 04/27/2009   No results found for: PTT

## 2015-03-08 NOTE — Evaluation (Signed)
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 Pt with below deficits (PT problem list) who will benefit from acute therapy to maximize mobility, balance, function and safety to decrease burden of care. Pt with LLE leaking in dependent position with 2x2 placed on leg and RN aware. Will continue to follow with nursing educated for mobility.      Follow Up Recommendations SNF;Supervision/Assistance - 24 hour    Equipment Recommendations  None recommended by PT    Recommendations for Other Services OT consult     Precautions / Restrictions Precautions Precautions: Fall Precaution Comments: R BKA with prosthesis Restrictions Weight Bearing Restrictions: No      Mobility  Bed Mobility Overal bed mobility: Needs Assistance Bed Mobility: Rolling;Supine to Sit Rolling: Min guard   Supine to sit: Min assist     General bed mobility comments: cues for sequence with assist to pivot legs to EoB, assist of rail to elevate trunk, increased time and assist to scoot to EOB. Assist for pericare in sidely secondary to incontinent of urine  Transfers Overall transfer level: Needs assistance Equipment used: Rolling walker (2 wheeled) Transfers: Sit to/from [employer] Sit to Stand: Mod assist Stand pivot transfers: Min assist       General transfer comment: cues for hand placement as pt wanting to pull up on RW, able to achieve standing from elevated bed with increased time cueing, and increased time in standing to extend bil knees. Sit ><stand x 3 with pivot to recliner with RW  Ambulation/Gait Ambulation/Gait assistance:  (pt denied attempting)              Stairs            Wheelchair Mobility    Modified Rankin (Stroke Patients Only)       Balance Overall balance assessment: Needs assistance   Sitting balance-Leahy Scale: Fair       Standing balance-Leahy Scale: Poor                               Pertinent Vitals/Pain Pain Assessment: 0-10 Pain Score: 4  Pain Location: sore LLE Pain Descriptors / Indicators: Aching Pain Intervention(s): Repositioned    Home Living Family/patient expects to be discharged to:: Private residence Living Arrangements: Children;Other relatives Available Help at Discharge: Family;Available PRN/intermittently Type of Home: Apartment Home Access: Stairs to enter Entrance Stairs-Rails: Right Entrance Stairs-Number of Steps: 1 Home Layout: One level Home Equipment: Cane - single point;Shower seat;Walker - 2 wheels;Wheelchair - manual      Prior Function Level of Independence: Independent with assistive device(s)         Comments: no family present to confirm but pt states she was mod I with RW and prosthesis limited distance and uses WC     Hand Dominance        Extremity/Trunk Assessment   Upper Extremity Assessment: Defer to OT evaluation           Lower Extremity Assessment: Generalized weakness      Cervical / Trunk Assessment: Normal  Communication   Communication: No difficulties  Cognition Arousal/Alertness: Awake/alert Behavior During Therapy: WFL for tasks  assessed/performed Overall Cognitive Status: No family/caregiver present to determine baseline cognitive functioning Area of Impairment: Following commands;Safety/judgement;Attention   Current Attention Level: Sustained Memory: Decreased short-term memory   Safety/Judgement: Decreased awareness of deficits     General Comments: Pt with decreased attention with tangential thoughts and story telling throughout with cues to attend to task and for safety. Pt unable to state how  she dons prosthesis.     General Comments      Exercises        Assessment/Plan    PT Assessment Patient needs continued PT services  PT Diagnosis Difficulty walking;Acute pain;Altered mental status   PT Problem List Decreased strength;Decreased cognition;Decreased balance;Decreased activity tolerance;Decreased safety awareness;Decreased knowledge of use of DME;Decreased mobility;Decreased skin integrity  PT Treatment Interventions Gait training;Functional mobility training;Therapeutic activities;Therapeutic exercise;Balance training;Patient/family education;Cognitive remediation;DME instruction   PT Goals (Current goals can be found in the Care Plan section) Acute Rehab PT Goals Patient Stated Goal: return home PT Goal Formulation: With patient Time For Goal Achievement: 03/22/15 Potential to Achieve Goals: Fair    Frequency Min 3X/week   Barriers to discharge Decreased caregiver support      Co-evaluation               End of Session Equipment Utilized During Treatment: Gait belt;Other (comment) (prosthetic) Activity Tolerance: Patient limited by fatigue Patient left: in chair;with call bell/phone within reach;with nursing/sitter in room Nurse Communication: Mobility status;Precautions         Time: [id number] PT Time Calculation (min) (ACUTE ONLY): 31 min   Charges:   PT Evaluation $Initial PT Evaluation Tier I: 1 Procedure PT Treatments $Therapeutic Activity: 8-22 mins   PT G CodesDelorse Lek:        Tabor, Beatriz Settles Beth 03/08/2015, 11:09 AM Delaney MeigsMaija Tabor Jinny Sweetland, PT [phone]

## 2015-03-08 NOTE — Evaluation (Signed)
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  Pt presenting with impaired cognition (no family available, but RN reporting she is at her baseline), pain, decreased activity tolerance, and impaired balance interfering with ability to perform ADL and ADL transfers.  Plan is for SNF level rehab prior to return home.  Will defer further OT to SNF.    Follow Up Recommendations  SNF;Supervision/Assistance - 24 hour    Equipment Recommendations       Recommendations for Other Services       Precautions / Restrictions Precautions Precautions: Fall Precaution Comments: R BKA with prosthesis Required Braces or Orthoses:  ([employer] shoe on L) Restrictions Weight Bearing Restrictions: No      Mobility Bed Mobility Overal bed mobility: Needs Assistance Bed Mobility: Rolling;Supine to Sit Rolling: Min guard   Supine to sit: Min assist     General bed mobility comments: pt up in chair  Transfers Overall transfer level: Needs assistance Equipment used: Rolling walker (2 wheeled) Transfers: Sit to/from Stand Sit to Stand: Mod assist Stand pivot transfers: Min assist       General transfer comment: verbal cues for technique, extra time, assist to rise and gain balance    Balance Overall balance assessment: Needs assistance   Sitting balance-Leahy Scale: Fair       Standing balance-Leahy Scale: Poor                              ADL Overall ADL's : Needs assistance/impaired Eating/Feeding: Independent;Sitting   Grooming: Wash/dry face;Set up;Sitting   Upper Body Bathing: Minimal assitance;Sitting   Lower  Body Bathing: Total assistance;Sit to/from stand   Upper Body Dressing : Minimal assistance;Sitting   Lower Body Dressing: Total assistance;Sit to/from stand   Toilet Transfer: Moderate assistance;Stand-pivot   Toileting- Clothing Manipulation and Hygiene: Total assistance;Sit to/from stand               Vision Additional Comments: pt opens and closes eyes singly frequently, cannot see clock, reports no change from baseline   Perception     Praxis      Pertinent Vitals/Pain Pain Assessment: Faces Pain Score: 4  Faces Pain Scale: Hurts little more Pain Location: buttocks Pain Descriptors / Indicators: Sore;Grimacing Pain Intervention(s): Repositioned (stood )     Hand Dominance Right   Extremity/Trunk Assessment Upper Extremity Assessment Upper Extremity Assessment: LUE deficits/detail LUE Deficits / Details: weakness from old CVA, able to use as a gross assist only LUE Coordination: decreased fine motor;decreased gross motor   Lower Extremity Assessment Lower Extremity Assessment: Defer to PT evaluation   Cervical / Trunk Assessment Cervical / Trunk Assessment: Normal   Communication Communication Communication: No difficulties   Cognition Arousal/Alertness: Awake/alert Behavior During Therapy: WFL for tasks assessed/performed Overall Cognitive Status: No family/caregiver present to determine baseline cognitive functioning Area of Impairment: Orientation Orientation Level: Disoriented to;Place;Time;Situation Current Attention Level: Sustained Memory: Decreased short-term memory   Safety/Judgement: Decreased awareness of deficits     General Comments: Pt avoiding answers to questions by joking.   General Comments       Exercises  Shoulder Instructions      Home Living Family/patient expects to be discharged to:: Private residence Living Arrangements: Children;Other relatives Available Help at Discharge: Family;Available  PRN/intermittently Type of Home: Apartment Home Access: Stairs to enter Entrance Stairs-Number of Steps: 1 Entrance Stairs-Rails: Right Home Layout: One level     Bathroom Shower/Tub: Chief Strategy OfficerTub/shower unit   Bathroom Toilet: Standard     Home Equipment: Cane - single point;Shower seat;Walker - 2 wheels;Wheelchair - manual          Prior Functioning/Environment Level of Independence: Independent with assistive device(s)        Comments: no family present to confirm but pt states she was mod I with RW and prosthesis limited distance and uses WC    OT Diagnosis: Generalized weakness;Cognitive deficits;Hemiplegia dominant side   OT Problem List:     OT Treatment/Interventions:      OT Goals(Current goals can be found in the care plan section) Acute Rehab OT Goals Patient Stated Goal: agreeable to ST rehab  OT Frequency:     Barriers to D/C:            Co-evaluation              End of Session Equipment Utilized During Treatment: Rolling walker;Gait belt Nurse Communication: Other (comment) (change in cognitive status vs. pt's typical behavior)  Activity Tolerance: Patient limited by fatigue Patient left: in chair;with call bell/phone within reach   Time: 1314-1340 OT Time Calculation (min): 26 min Charges:  OT General Charges $OT Visit: 1 Procedure OT Evaluation $Initial OT Evaluation Tier I: 1 Procedure OT Treatments $Self Care/Home Management : 8-22 mins G-Codes:    Evern BioMayberry, Magdala Brahmbhatt Lynn 03/08/2015, 2:10 PM  [phone]

## 2015-03-08 NOTE — Progress Notes (Signed)
Lianne CureMaureen Collins, PA for vascular surgery updated on continued oozing of dark red blood.

## 2015-03-08 NOTE — Progress Notes (Signed)
   [facility] [place]  Subjective  - Doing well over all.     Objective 121/53 106 98.6 F (37 C) (Oral) 16 95%  Intake/Output Summary (Last 24 hours) at 03/08/15 0757 Last data filed at 03/07/15 1930  Gross per 24 hour  Intake    335 ml  Output    200 ml  Net    135 ml   ABI 0.74 left    Left above knee incision bloody drainage on dressing, no active drainage Clean dry dressing applied with kerlex and ace Heart RRR  Assessment/Planning: POD # fem-PT bypass Dressing saturated at above knee incision Mobiltiy encouraged Acute blood loss anemia transfused 1 unit PRBC 03/07/2015 HGB post transfusion pending  Clinton GallantCOLLINS, Leahna [facility]EN 03/08/2015 7:57 AM --  Laboratory Lab Results:  Recent Labs  03/06/15 0524 03/07/15 0324  WBC 9.3 10.4  HGB 11.3* 8.0*  HCT 35.2* 24.6*  PLT 231 179   BMET  Recent Labs  03/06/15 0524 03/07/15 0324  NA 139 136  K 4.0 4.5  CL 109 107  CO2 23 22  GLUCOSE 110* 222*  BUN 20 18  CREATININE 0.96 0.90  CALCIUM 8.7 7.9*    COAG Lab Results  Component Value Date   INR 1.03 10/03/2014   INR 0.99 10/24/2013   INR 1.2 04/27/2009   No results found for: PTT

## 2015-03-08 NOTE — Progress Notes (Signed)
VASCULAR LAB PRELIMINARY  ARTERIAL  ABI completed:    RIGHT    LEFT    PRESSURE WAVEFORM  PRESSURE WAVEFORM  BRACHIAL 132 Triphasic BRACHIAL 149 Triphasic  DP   DP 85 Monophasic  AT   AT    PT   PT 110 Monophasic  PER   PER    GREAT TOE  NA GREAT TOE  NA    RIGHT LEFT  ABI BKA 0.74   The left ABI is suggestive of moderate reduction of flow.  03/08/2015 9:30 AM Gertie FeyMichelle Danner Paulding, RVT, RDCS, RDMS

## 2015-03-08 NOTE — Progress Notes (Signed)
Medicare Important Message given? YES  (If response is "NO", the following Medicare IM given date fields will be blank)  Date Medicare IM given: 03/08/15 Medicare IM given by:  Anmarie Fukushima  

## 2015-03-08 NOTE — Clinical Social Work Placement (Addendum)
Clinical Social Work Department CLINICAL SOCIAL WORK PLACEMENT NOTE 03/08/2015  Patient:  Laurie [name],Laurie [name]  Account Number:  [id number] Admit date:  03/05/2015  Clinical Social Worker:  Merlyn LotJENNA HOLOMAN, CLINICAL SOCIAL WORKER  Date/time:  03/08/2015 01:07 PM  Clinical Social Work is seeking post-discharge placement for this patient at the following level of care:   SKILLED NURSING   (*CSW will update this form in Epic as items are completed)   03/08/2015  [facility]family provided with [facility] of Clinical Social Work's list of facilities offering this level of care within the geographic area requested by the patient (or if unable, by the patient's family).  03/08/2015  Patient/family informed of their freedom to choose among providers that offer the needed level of care, that participate in Medicare, Medicaid or managed care program needed by the patient, have an available bed and are willing to accept the patient.  03/08/2015  Patient/family informed of [facility]' ownership interest in [facility], as well as of the fact that they are under no obligation to receive care at this facility.  PASARR submitted to EDS on 03/08/2015 PASARR number received on 03/08/2015  FL2 transmitted to all facilities in geographic area requested by pt/family on  03/08/2015 FL2 transmitted to all facilities within larger geographic area on   Patient informed that his/her managed care company has contracts with or will negotiate with  certain facilities, including the following:     Patient/family informed of bed offers received:  03/09/2015 Patient chooses bed at [facility] Physician recommends and patient chooses bed at    [facility]  [facility]  03/10/2015 Patient to be transferred to facility by [facility] Patient and family notified of transfer on 03/10/2015 Name of family member notified:  Shanda BumpsJessica (daughter) voicemail left and pt aware  The  following physician request were entered in Epic:   Additional Comments: Merlyn LotJenna Holoman, [facility] Clinical Social Worker [phone]

## 2015-03-08 NOTE — Progress Notes (Signed)
Left leg dressing over surgical incision saturated. With dressing off, slow trickle of dark red blood noted from proximal thigh, knee area and calf area in three spots. Skin around incision soft. New dressing applied with nonadhering gauze pads, covered with ABD pads for absorption, and kerlex wrapped loosely around leg to hold dressings in place. Ace wrap wrapped loosely over thigh part of dressings.

## 2015-03-09 LAB — GLUCOSE, CAPILLARY
GLUCOSE-CAPILLARY: 132 mg/dL — AB (ref 70–99)
Glucose-Capillary: 94 mg/dL (ref 70–99)
Glucose-Capillary: 117 mg/dL — ABNORMAL HIGH (ref 70–99)
Glucose-Capillary: 110 mg/dL — ABNORMAL HIGH (ref 70–99)

## 2015-03-09 LAB — HEMOGLOBIN AND HEMATOCRIT, BLOOD
HCT: 25.7 % — ABNORMAL LOW (ref 36.0–46.0)
HEMOGLOBIN: 8.5 g/dL — AB (ref 12.0–15.0)

## 2015-03-09 LAB — PREPARE RBC (CROSSMATCH)

## 2015-03-09 MED ORDER — SODIUM CHLORIDE 0.9 % IV SOLN: Freq: Once | INTRAVENOUS | Status: AC

## 2015-03-09 MED ORDER — FUROSEMIDE 10 MG/ML IJ SOLN
20.0000 mg | Freq: Once | INTRAMUSCULAR | Status: AC
  Administered 2015-03-09: 20 mg via INTRAVENOUS
  Filled 2015-03-09: qty 2

## 2015-03-09 NOTE — Progress Notes (Signed)
Patient transferred to room 2W02. She walked partway with PT and OT and continued the rest in the chair. She is in the chair with callbell in reach. Oriented to new room. [place], [place] in attendance checking vital signs.

## 2015-03-09 NOTE — Progress Notes (Signed)
Pt is lethargic. She is confused when she is awakened, but orients easily. She fell asleep while eating breakfast and spilled her tray all over bed.

## 2015-03-09 NOTE — Clinical Social Work Note (Signed)
CSW presented bed offers to patient and daughter- they [facility].  CSW will continue to follow.  Merlyn LotJenna Holoman, LCSWA Clinical Social Worker [phone]

## 2015-03-09 NOTE — Progress Notes (Signed)
Was instructed to hold off on transfusing two units pending repeat H and H since last was 3/17.  Lab called to say their new sample was coagulated and they are drawing another.

## 2015-03-09 NOTE — Progress Notes (Addendum)
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 Continue to recommend SNF and will continue to follow to maximize function and safety. Pt with decreased safety awareness impacting mobility.   Follow Up Recommendations  SNF;Supervision/Assistance - 24 hour     Equipment Recommendations       Recommendations for Other Services       Precautions / Restrictions Precautions Precautions: Fall Precaution Comments: R BKA with prosthesis Required Braces or Orthoses: Other Brace/Splint Other Brace/Splint: post op shoe LLE Restrictions Weight Bearing Restrictions: No    Mobility  Bed Mobility Overal bed mobility: Needs Assistance Bed Mobility: Supine to Sit     Supine to sit: Min assist     General bed mobility comments: cues for sequence with assist to fully elevate trunk  Transfers     Transfers: Sit to/from Stand Sit to Stand: Mod assist;+2 physical assistance;From elevated surface         General transfer comment: pt stood from bed mod assist +2 with cues for hand placement and positioning, second trial from recliner started ascending with pelvis rotated left and max assist able to clear buttocks but could not achieve full standing. 3rd trial from chair mod assist +2 with hips squared and able to achieve standing. Pt with decreased control sitting on 1st and 3rd trial with mod assist to control descent and contact with surface despite max cues for hand placement and safety  Ambulation/Gait Ambulation/Gait assistance: Min assist;+2 safety/equipment Ambulation Distance (Feet): 17 Feet Assistive device: Rolling walker  (2 wheeled) Gait Pattern/deviations: Step-through pattern;Decreased stride length;Trunk flexed   Gait velocity interpretation: Below normal speed for age/gender General Gait Details: pt walked 6' then 17' with seated rest between, chair followed closely behind and max cues for extending knees, pelvis in neutral due to tendency for left rotation, and position in RW   Stairs            Wheelchair Mobility    Modified Rankin (Stroke Patients Only)       Balance Overall balance assessment: Needs assistance   Sitting balance-Leahy Scale: Fair       Standing balance-Leahy Scale: Poor                      Cognition Arousal/Alertness: Awake/alert Behavior During Therapy: WFL for tasks assessed/performed     Orientation Level: Disoriented to;Time Current Attention Level: Sustained Memory: Decreased short-term memory   Safety/Judgement: Decreased awareness of deficits          Exercises      General Comments        Pertinent Vitals/Pain Pain Assessment: No/denies pain    Home Living                      Prior Function            PT Goals (current goals can now be found in the care plan section) Progress towards PT goals: Progressing toward goals    Frequency       PT Plan Current plan remains appropriate    Co-evaluation  End of Session Equipment Utilized During Treatment: Gait belt Activity Tolerance: Patient tolerated treatment well Patient left: in chair;with call bell/phone within reach;with chair alarm set     Time: [id number] PT Time Calculation (min) (ACUTE ONLY): 29 min  Charges:  $Gait Training: 8-22 mins $Therapeutic Activity: 8-22 mins                    G Codes:      Laurie [name], Laurie [name] 03/09/2015, 9:40 AM Laurie [name], PT [phone]

## 2015-03-09 NOTE — Progress Notes (Signed)
IV infiltrated after 10 minutes. No reaction noted. Floor nurses unable to restart due to poor venous access. IV team restarted IV and transfusion resumed.

## 2015-03-09 NOTE — Progress Notes (Addendum)
  [facility] Progress Note  03/09/2015 9:49 AM 3 Days Post-Op  Subjective:  Doing ok.   Tmax 99.2 BP sys 110s-120s 02 100% RA  Filed Vitals:   03/09/15 0928  BP: 110/54  Pulse: 114  Temp:   Resp: 18    Physical Exam: Incisions:  Left medial thigh incisions with active oozing.  Extremities:  Biphasic left PT, monophasic left DP  CBC    Component Value Date/Time   WBC 10.3 03/08/2015 1120   RBC 2.69* 03/08/2015 1120   HGB 8.5* 03/08/2015 1120   HCT 26.0* 03/08/2015 1120   PLT 175 03/08/2015 1120   MCV 96.7 03/08/2015 1120   MCH 31.6 03/08/2015 1120   MCHC 32.7 03/08/2015 1120   RDW 14.2 03/08/2015 1120   LYMPHSABS 1.8 11/01/2014 2154   MONOABS 1.1* 11/01/2014 2154   EOSABS 0.1 11/01/2014 2154   BASOSABS 0.0 11/01/2014 2154    BMET    Component Value Date/Time   NA 136 03/08/2015 1120   K 4.0 03/08/2015 1120   CL 104 03/08/2015 1120   CO2 24 03/08/2015 1120   GLUCOSE 131* 03/08/2015 1120   BUN 20 03/08/2015 1120   CREATININE 0.98 03/08/2015 1120   CALCIUM 8.7 03/08/2015 1120   GFRNONAA 59* 03/08/2015 1120   GFRAA 68* 03/08/2015 1120    INR    Component Value Date/Time   INR 1.03 10/03/2014 0606     Intake/Output Summary (Last 24 hours) at 03/09/15 0949 Last data filed at 03/09/15 0730  Gross per 24 hour  Intake    720 ml  Output    300 ml  Net    420 ml     Assessment:  69 y.o. female is s/p: Left femoral to posterior tibial bypass using reversed ipsilateral greater saphenous vein 3 Days Post-Op  Plan: -Still with oozing of left incision. Will hold lovenox and plavix. -Doppler signals left DP and PT -Ambulate -CSW for SNF placement.  -Dispo: d/c when oozing is improved and ambulation improves.    Maris BergerKimberly Trinh, PA-C [facility] Office: [phone] Pager: [phone] 03/09/2015 9:49 AM   Still having some intermittent oozing from saphenecotomy sites intermittently, no oozing over anastomotic  sites Biphasic PT doppler Monophasic DP doppler Incisions healing Will hold pletal and lovenox while bleeding Continue ASA for now Scd for left leg Home vs SNF when incisions are stable. Tachycardic still with blood loss anemia transfuse 2 URBC  Fabienne Brunsharles Teya Otterson, MD [facility] [place] Office: [phone] Pager: [phone]

## 2015-03-09 NOTE — Progress Notes (Signed)
Utilization review completed.  

## 2015-03-10 LAB — TYPE AND SCREEN
ABO/RH(D): A POS
Antibody Screen: NEGATIVE
UNIT DIVISION: 0
UNIT DIVISION: 0
Unit division: 0

## 2015-03-10 LAB — CBC
HCT: 28.1 % — ABNORMAL LOW (ref 36.0–46.0)
Hemoglobin: 9.4 g/dL — ABNORMAL LOW (ref 12.0–15.0)
MCH: 30.7 pg (ref 26.0–34.0)
MCHC: 33.5 g/dL (ref 30.0–36.0)
MCV: 91.8 fL (ref 78.0–100.0)
PLATELETS: 165 10*3/uL (ref 150–400)
RBC: 3.06 MIL/uL — ABNORMAL LOW (ref 3.87–5.11)
RDW: 14.9 % (ref 11.5–15.5)
WBC: 8.2 10*3/uL (ref 4.0–10.5)

## 2015-03-10 LAB — GLUCOSE, CAPILLARY
GLUCOSE-CAPILLARY: 93 mg/dL (ref 70–99)
GLUCOSE-CAPILLARY: 119 mg/dL — AB (ref 70–99)
Glucose-Capillary: 65 mg/dL — ABNORMAL LOW (ref 70–99)
Glucose-Capillary: 62 mg/dL — ABNORMAL LOW (ref 70–99)

## 2015-03-10 LAB — BASIC METABOLIC PANEL
Anion gap: 6 (ref 5–15)
BUN: 14 mg/dL (ref 6–23)
CALCIUM: 8.4 mg/dL (ref 8.4–10.5)
CO2: 26 mmol/L (ref 19–32)
Chloride: 102 mmol/L (ref 96–112)
Creatinine, Ser: 1.03 mg/dL (ref 0.50–1.10)
GFR calc Af Amer: 64 mL/min — ABNORMAL LOW (ref >90)
GFR calc non Af Amer: 55 mL/min — ABNORMAL LOW (ref >90)
GLUCOSE: 68 mg/dL — AB (ref 70–99)
POTASSIUM: 3.8 mmol/L (ref 3.5–5.1)
Sodium: 134 mmol/L — ABNORMAL LOW (ref 135–145)

## 2015-03-10 MED ORDER — OXYCODONE-ACETAMINOPHEN 5-325 MG PO TABS: 1.0000 | ORAL_TABLET | Freq: Four times a day (QID) | ORAL | Status: DC | PRN

## 2015-03-10 NOTE — Progress Notes (Addendum)
Weekend CSW Proofreader(Clinical Social Worker) following pt case. Pt able to dc to facility over weekend if medically stable.  Tyaira Heward Lajean Savermbelal, LCSWA Weekend CSW [phone]

## 2015-03-10 NOTE — Progress Notes (Signed)
03/10/2015 1:25 PM [facility]   Kathryne HitchAllen, Gregrey Bloyd C

## 2015-03-10 NOTE — Progress Notes (Signed)
CSW (Clinical Child psychotherapistocial Worker) spoke with Marijean BravoGaurav at facility and notified pt is ready for dc and faxed dc summary to facility as requested. CSW prepared pt dc packet and placed with shadow chart. CSW arranged non-emergent ambulance transport. Pt, pt family, pt nurse, and facility informed. CSW signing off.   Yoav Okane Lajean Savermbelal, LCSWA Weekend CSW [phone]

## 2015-03-10 NOTE — Progress Notes (Addendum)
  [facility] Progress Note  03/10/2015 7:51 AM 4 Days Post-Op  Subjective: Pain with right leg   Filed Vitals:   03/10/15 0009  BP: 148/68  Pulse: 96  Temp: 98.4 F (36.9 C)  Resp:     Physical Exam: Incisions:  Left leg staple lines clean. Minor oozing at proximal saphenectomy site. Improved from yesterday.  Extremities:  Monophasic left DP. Monophasic-biphasic PT doppler flow. Ulceration of dorsal aspect of left 2-4th toes.   CBC    Component Value Date/Time   WBC 8.2 03/10/2015 0345   RBC 3.06* 03/10/2015 0345   HGB 9.4* 03/10/2015 0345   HCT 28.1* 03/10/2015 0345   PLT 165 03/10/2015 0345   MCV 91.8 03/10/2015 0345   MCH 30.7 03/10/2015 0345   MCHC 33.5 03/10/2015 0345   RDW 14.9 03/10/2015 0345   LYMPHSABS 1.8 11/01/2014 2154   MONOABS 1.1* 11/01/2014 2154   EOSABS 0.1 11/01/2014 2154   BASOSABS 0.0 11/01/2014 2154    BMET    Component Value Date/Time   NA 134* 03/10/2015 0345   K 3.8 03/10/2015 0345   CL 102 03/10/2015 0345   CO2 26 03/10/2015 0345   GLUCOSE 68* 03/10/2015 0345   BUN 14 03/10/2015 0345   CREATININE 1.03 03/10/2015 0345   CALCIUM 8.4 03/10/2015 0345   GFRNONAA 55* 03/10/2015 0345   GFRAA 64* 03/10/2015 0345    INR    Component Value Date/Time   INR 1.03 10/03/2014 0606     Intake/Output Summary (Last 24 hours) at 03/10/15 0751 Last data filed at 03/09/15 2015  Gross per 24 hour  Intake    582 ml  Output      0 ml  Net    582 ml     Assessment:  69 y.o. female is s/p: Left femoral to posterior tibial bypass using reversed ipsilateral greater saphenous vein 4 Days Post-Op  Plan: -Still with intermittent oozing from saphenectomy site but improved. Will continue holding plavix and pletal today. -Dry gauze to left groin. Change dressing to left legs twice daily or more often if needed. Santyl to left foot ulcerations daily -Acute blood loss anemia: H/H improved after one unit pRBCs.Tachycardia improved.  HR still upper 90s. Will monitor. -Needs to mobilize.  -Dispo: home vs SNF when incisions are stable.    Maris BergerKimberly Trinh, PA-C [facility] Office: [phone] Pager: [phone] 03/10/2015 7:51 AM     I have examined the patient, reviewed and agree with above. Palpable graft pulse above ankle. Foot well-perfused. Okay for transfer to skilled nursing facility when bed available.  EARLY, TODD, MD 03/10/2015 10:07 AM

## 2015-03-10 NOTE — Discharge Summary (Signed)
[facility] Discharge Summary  Laurie [name] 11/25/1946 69 y.o. female  [id number]  Admission Date: 03/05/2015  Discharge Date: 03/10/2015  Physician: Sherren Kerns, MD  Admission Diagnosis: pvd with non healing wound on left foot Peripheral vascular disease with nonhealing left foot wound I70.244   HPI:   This is a 69 y.o. female who presents for evaluation of nonhealing wound left foot. The patient is previously known to Dr. Darrick Penna for a prior right below-knee amputation several months ago. She has now been walking on a right leg prosthetic for a few weeks. She discussed her left foot somehow developed blisters recently. She has now had a wound on her left foot for approximately one month. She states that she has been followed at the wound center here in [place]. She was seen by my partner Dr. Myra Gianotti in the emergency room several days ago for bleeding of her left foot after a debridement at the wound center. Prior arteriogram in October 2015 by Dr. Nadara Eaton showed no percutaneous options with one-vessel runoff via the posterior tibial artery at the level of the ankle. The patient denies any recent episodes of chest pain or shortness of breath. However, she has not had a cardiac stress test since May 2014.Marland Kitchen Other medical problems include hypertension, diabetes, stage III sacral decubitus left buttocks all of which are currently stable. She states she is not currently smoking however she did smell of tobacco today. She is on aspirin and Plavix.  Hospital Course:  The patient was admitted to the hospital and taken to the [facility] lab on 03/05/2015 and underwent aortogram with bilateral iliac arteriogram. On 03/06/2015, she was taken to the operating room and underwent Left femoral to posterior tibial bypass using reversed ipsilateral greater saphenous vein, completion arteriogram.   The patient tolerated the procedure well and was transported to the PACU in  stable  condition.   On POD 1, she doing well with brisk biphasic PT doppler signal and monophasic DP signal. She had some acute blood loss anemia and was tachycardic and transfused one unit of pRBCS. She was transferred to the floor.   On POD 2,  she had issues with oozing from her saphenectomy incisions. Her plavix and pletal were held.   She continued to have oozing from her incisions on POD 3. She was transfused one more unit of pRBCs for tachycardia in the setting of acute blood loss anemia.    On POD 4, she had minimal oozing from her saphenectomy incision. Otherwise her incisions were healing well. She had a palpable graft pulse above her ankle. Her plavix and pletal were restarted. Her hemoglobin and hematocrit were stable following transfusion the day before. Physical therapy was recommending SNF. She was discharged to SNF on POD 4 in good condition.     CBC    Component Value Date/Time   WBC 8.2 03/10/2015 0345   RBC 3.06* 03/10/2015 0345   HGB 9.4* 03/10/2015 0345   HCT 28.1* 03/10/2015 0345   PLT 165 03/10/2015 0345   MCV 91.8 03/10/2015 0345   MCH 30.7 03/10/2015 0345   MCHC 33.5 03/10/2015 0345   RDW 14.9 03/10/2015 0345   LYMPHSABS 1.8 11/01/2014 2154   MONOABS 1.1* 11/01/2014 2154   EOSABS 0.1 11/01/2014 2154   BASOSABS 0.0 11/01/2014 2154    BMET    Component Value Date/Time   NA 134* 03/10/2015 0345   K 3.8 03/10/2015 0345   CL 102 03/10/2015 0345   CO2 26  03/10/2015 0345   GLUCOSE 68* 03/10/2015 0345   BUN 14 03/10/2015 0345   CREATININE 1.03 03/10/2015 0345   CALCIUM 8.4 03/10/2015 0345   GFRNONAA 55* 03/10/2015 0345   GFRAA 64* 03/10/2015 0345     Discharge Instructions:   The patient is discharged to skilled nursing facility with extensive instructions on wound care and progressive ambulation.  They are instructed not to drive or perform any heavy lifting until returning to see the physician in his office.  Discharge Instructions    Call MD for:   redness, tenderness, or signs of infection (pain, swelling, bleeding, redness, odor or green/yellow discharge around incision site)    Complete by:  As directed      Call MD for:  severe or increased pain, loss or decreased feeling  in affected limb(s)    Complete by:  As directed      Call MD for:  temperature >100.5    Complete by:  As directed      Discharge wound care:    Complete by:  As directed   Keep groin and leg wounds clean and dry. Wash wounds daily with soap and water. Apply santyl to wounds on left foot daily.     Driving Restrictions    Complete by:  As directed   No driving for 2 weeks     Increase activity slowly    Complete by:  As directed   Walk with assistance use walker or cane as needed     Lifting restrictions    Complete by:  As directed   No lifting for 2 weeks     Resume previous diet    Complete by:  As directed            Discharge Diagnosis:  pvd with non healing wound on left foot Peripheral vascular disease with nonhealing left foot wound I70.244  Secondary Diagnosis: Patient Active Problem List   Diagnosis Date Noted  . PVD (peripheral vascular disease) 03/05/2015  . S/P BKA (below knee amputation) 11/23/2014  . Depression 11/11/2014  . Gangrene of foot   . Hypokalemia   . Peripheral vascular disease 11/02/2014  . Gangrene 11/02/2014  . Wound, surgical, infected 11/01/2014  . Diabetic foot ulcer 11/01/2014  . Trochanteric bursitis of left hip 05/29/2014  . Spastic hemiplegia affecting nondominant side 12/02/2013  . Chronic diastolic heart failure, grade 2 10/26/2013  . Dyslipidemia 10/26/2013  . CVA (cerebral infarction) 10/25/2013  . Arthralgia of hip 10/25/2013  . Anemia 05/17/2009  . Diabetes mellitus type 2 with peripheral artery disease 05/08/2009  . Anxiety state 05/08/2009  . Essential hypertension 05/08/2009   Past Medical History  Diagnosis Date  . Hypertension   . Chest pain     Troponins up to 0.12 while hospitalized  for E.coli PNA  . 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 Bacteremia   . Anemia   . Narcolepsy   . Family history of adverse reaction to anesthesia     "brother died Jul 18, 2013; woke up w/headache, vomited blood; drilled holes in his head; bleeding stroke; never came back" (03/05/2015)  . High cholesterol   . Type II diabetes mellitus   . Walking pneumonia 04/2009    "double" Hattie Perch 04/23/2011  . Stroke 10/2014    "weaker on left side since" (03/05/2015)  . Headache     "when someone gets on my nerves" (03/05/2015)  . Bursitis     "left hip;  sometimes left shoulder" (03/05/2015)  . Anxiety   . Depression        Medication List    TAKE these medications        ALPRAZolam 0.25 MG tablet  Commonly known as:  XANAX  Take one tablet by mouth twice daily as needed for anxiety     amphetamine-dextroamphetamine 10 MG tablet  Commonly known as:  ADDERALL  Take 10 mg by mouth 2 (two) times daily with a meal.     aspirin EC 81 MG tablet  Take 81 mg by mouth daily.     atorvastatin 10 MG tablet  Commonly known as:  LIPITOR  Take 1 tablet (10 mg total) by mouth daily at 6 PM.     cephALEXin 500 MG capsule  Commonly known as:  KEFLEX  Take 1 capsule (500 mg total) by mouth 3 (three) times daily.     cilostazol 100 MG tablet  Commonly known as:  PLETAL  Take 1 tablet (100 mg total) by mouth 2 (two) times daily.     citalopram 10 MG tablet  Commonly known as:  CELEXA  Take 10 mg by mouth daily.     clopidogrel 75 MG tablet  Commonly known as:  PLAVIX  Take 75 mg by mouth daily.     COQ10 PO  Take 1 capsule by mouth daily.     DULoxetine 60 MG capsule  Commonly known as:  CYMBALTA  Take 60 mg by mouth daily.     gabapentin 300 MG capsule  Commonly known as:  NEURONTIN  Take 1 capsule (300 mg total) by mouth 2 (two) times daily.     hydrochlorothiazide 12.5 MG capsule  Commonly known as:  MICROZIDE  Take 1 capsule (12.5 mg total) by mouth daily.      HYDROcodone-acetaminophen 5-325 MG per tablet  Commonly known as:  NORCO/VICODIN  Take one tablet by mouth every 6 hours as needed for pain     insulin glargine 100 UNIT/ML injection  Commonly known as:  LANTUS  Inject 0.2 mLs (20 Units total) into the skin at bedtime.     lisinopril 20 MG tablet  Commonly known as:  PRINIVIL,ZESTRIL  Take 1 tablet (20 mg total) by mouth daily.     modafinil 200 MG tablet  Commonly known as:  PROVIGIL  Take one tablet by mouth twice daily for narcolepsy     oxyCODONE-acetaminophen 5-325 MG per tablet  Commonly known as:  PERCOCET/ROXICET  Take 1-2 tablets by mouth every 6 (six) hours as needed for severe pain.     SANTYL ointment  Generic drug:  collagenase  Apply 1 application topically daily.     silver sulfADIAZINE 1 % cream  Commonly known as:  SILVADENE  Apply 1 application topically daily.     sulfamethoxazole-trimethoprim 800-160 MG per tablet  Commonly known as:  SEPTRA DS  Take 1 tablet by mouth 2 (two) times daily.     VITAMIN D3 PO  Take 1 tablet by mouth daily.        Percocet #30 No Refill  Disposition: SNF  Patient's condition: is Good  Follow up: 1. Dr. Darrick PennaFields in 2 weeks   Maris BergerKimberly Giulian Goldring, PA-C [facility] [phone] 03/10/2015  10:58 AM  - For VQI Registry use --- Instructions: Press F2 to tab through selections.  Delete question if not applicable.   Post-op:  Wound infection: No  Graft infection: No  Transfusion: Yes  If yes, 2 units given  New Arrhythmia: No Ipsilateral amputation: No, [ ]  Minor, [ ]  BKA, [ ]  AKA Discharge patency: [ x] Primary, [ ]  Primary assisted, [ ]  Secondary, [ ]  Occluded Patency judged by: [ ]  Dopper only, [x ] Palpable graft pulse, [ ]  Palpable distal pulse, [ ]  ABI inc. > 0.15, [ ]  Duplex Discharge ABI: R BKA, L 0.74 D/C Ambulatory Status: Ambulatory with Assistance  Complications: MI: No, [ ]  Troponin only, [ ]  EKG or Clinical CHF: No Resp failure:No,  [ ]  Pneumonia, [ ]  Ventilator Chg in renal function: No, [ ]  Inc. Cr > 0.5, [ ]  Temp. Dialysis, [ ]  Permanent dialysis Stroke: No, [ ]  Minor, [ ]  Major Return to OR: No  Reason for return to OR: [ ]  Bleeding, [ ]  Infection, [ ]  Thrombosis, [ ]  Revision  Discharge medications: Statin use:  yes ASA use:  yes Plavix use:  yes Beta blocker use: no, on ACE and thiazide Coumadin use: no

## 2015-03-12 ENCOUNTER — Telehealth: Payer: Self-pay | Admitting: Vascular Surgery

## 2015-03-12 NOTE — Telephone Encounter (Signed)
LM with patients daughter, dpm

## 2015-03-12 NOTE — Telephone Encounter (Signed)
-----   Message from Sharee PimpleMarilyn K McChesney, RN sent at 03/11/2015  9:24 PM EDT ----- Regarding: Schedule   ----- Message -----    From: Raymond GurneyKimberly A Trinh, PA-C    Sent: 03/10/2015  10:52 AM      To: [facility] Charge Pool  S/p Left femoral to posterior tibial bypass using reversed ipsilateral greater saphenous vein, completion arteriogram 03/06/15  F/u with Dr. Darrick PennaFields in 2 weeks.   Thanks Selena BattenKim

## 2015-03-20 ENCOUNTER — Encounter: Payer: Self-pay | Admitting: Vascular Surgery

## 2015-03-21 ENCOUNTER — Encounter: Payer: Medicare Other | Admitting: Vascular Surgery

## 2015-03-28 ENCOUNTER — Encounter: Payer: Self-pay | Admitting: Vascular Surgery

## 2015-03-29 ENCOUNTER — Encounter: Payer: Self-pay | Admitting: Vascular Surgery

## 2015-03-29 ENCOUNTER — Ambulatory Visit (INDEPENDENT_AMBULATORY_CARE_PROVIDER_SITE_OTHER): Payer: Self-pay | Admitting: Vascular Surgery

## 2015-03-29 VITALS — BP 164/79 | HR 102 | Ht 67.0 in | Wt 184.0 lb

## 2015-03-29 DIAGNOSIS — I739 Peripheral vascular disease, unspecified: Secondary | ICD-10-CM

## 2015-03-29 NOTE — Progress Notes (Signed)
Patient is a 69 year old female who returns for postoperative follow-up today. She underwent a left femoral to posterior tibial bypass with reversed greater saphenous on March 15. This was done for nonhealing ulcers on the dorsal aspect of toes on her left foot. She has previously undergone a right below-knee amputation. She denies any pain in the foot. She has some occasional numbness and tingling. She is currently residing at a skilled nursing facility gaining her strength back.  Physical exam:  Filed Vitals:   03/29/15 1104 03/29/15 1146  BP: 168/79 164/79  Pulse: 105 102  Height: 5\' 7"  (1.702 m)   Weight: 184 lb (83.462 kg)   SpO2: 97%     Left lower extremity: All incisions healing staples intact left leg is still fairly edematous, dorsal wounds on toes 2 and 31.5 cm diameter with pink granulation tissue forming 2 mm ulcer dorsal aspect left fourth toe almost healed. Brisk biphasic to triphasic posterior tibial Doppler signal.  Assessment: Patent femoral to posterior tibial bypass graft with healing wounds left foot  Plan: Half of staples were removed today. She'll return for follow-up in 2 weeks to remove the other half. Continue current local wound care left foot.  Fabienne Brunsharles Fields, MD [facility] [place] Office: [phone] Pager: [phone]

## 2015-04-11 ENCOUNTER — Encounter: Payer: Self-pay | Admitting: Vascular Surgery

## 2015-04-12 ENCOUNTER — Ambulatory Visit (INDEPENDENT_AMBULATORY_CARE_PROVIDER_SITE_OTHER): Payer: Self-pay | Admitting: Vascular Surgery

## 2015-04-12 ENCOUNTER — Encounter: Payer: Self-pay | Admitting: Vascular Surgery

## 2015-04-12 VITALS — BP 144/48 | HR 63 | Ht 67.0 in | Wt 184.0 lb

## 2015-04-12 DIAGNOSIS — I739 Peripheral vascular disease, unspecified: Secondary | ICD-10-CM

## 2015-04-12 NOTE — Progress Notes (Signed)
POST OPERATIVE OFFICE NOTE    CC:  F/u for surgery  HPI:  This is a 69 y.o. female who is s/p Left fem-pop by pass with reverse great saph vein 03/06/2015.  She returns today for a wound check.  Last visit every other staple was removed.  She is currently residing at a skilled nursing facility gaining her strength back.  She reports her left leg and foot are feeling better since the surgery.  She has had a previous right BKA.  Allergies  Allergen Reactions  . Penicillins Hives    Childhood allergy could have been more reaction only remembers hives.     Current Outpatient Prescriptions  Medication Sig Dispense Refill  . ALPRAZolam (XANAX) 0.25 MG tablet Take one tablet by mouth twice daily as needed for anxiety (Patient taking differently: Take 0.25 mg by mouth 2 (two) times daily. Take one tablet by mouth twice daily as needed for anxiety) 60 tablet 2  . amphetamine-dextroamphetamine (ADDERALL) 10 MG tablet Take 10 mg by mouth 2 (two) times daily with a meal.    . aspirin EC 81 MG tablet Take 81 mg by mouth daily.    Marland Kitchen. atorvastatin (LIPITOR) 10 MG tablet Take 1 tablet (10 mg total) by mouth daily at 6 PM. 30 tablet 1  . cephALEXin (KEFLEX) 500 MG capsule Take 1 capsule (500 mg total) by mouth 3 (three) times daily. 30 capsule 2  . Cholecalciferol (VITAMIN D3 PO) Take 1 tablet by mouth daily.    . cilostazol (PLETAL) 100 MG tablet Take 1 tablet (100 mg total) by mouth 2 (two) times daily. 60 tablet 3  . citalopram (CELEXA) 10 MG tablet Take 10 mg by mouth daily.    . clopidogrel (PLAVIX) 75 MG tablet Take 75 mg by mouth daily.    . Coenzyme Q10 (COQ10 PO) Take 1 capsule by mouth daily.    . DULoxetine (CYMBALTA) 60 MG capsule Take 60 mg by mouth daily.    Marland Kitchen. gabapentin (NEURONTIN) 300 MG capsule Take 1 capsule (300 mg total) by mouth 2 (two) times daily. (Patient taking differently: Take 300 mg by mouth 3 (three) times daily. ) 60 capsule 1  . hydrochlorothiazide (MICROZIDE) 12.5  MG capsule Take 1 capsule (12.5 mg total) by mouth daily. 30 capsule 1  . HYDROcodone-acetaminophen (NORCO/VICODIN) 5-325 MG per tablet Take one tablet by mouth every 6 hours as needed for pain 120 tablet 0  . insulin glargine (LANTUS) 100 UNIT/ML injection Inject 0.2 mLs (20 Units total) into the skin at bedtime. 10 mL 11  . lisinopril (PRINIVIL,ZESTRIL) 20 MG tablet Take 1 tablet (20 mg total) by mouth daily. 30 tablet 1  . modafinil (PROVIGIL) 200 MG tablet Take one tablet by mouth twice daily for narcolepsy 60 tablet 0  . oxyCODONE-acetaminophen (PERCOCET/ROXICET) 5-325 MG per tablet Take 1-2 tablets by mouth every 6 (six) hours as needed for severe pain. 30 tablet 0  . SANTYL ointment Apply 1 application topically daily.   2  . silver sulfADIAZINE (SILVADENE) 1 % cream Apply 1 application topically daily. 50 g 0  . sulfamethoxazole-trimethoprim (SEPTRA DS) 800-160 MG per tablet Take 1 tablet by mouth 2 (two) times daily. 28 tablet 0   No current facility-administered medications for this visit.     ROS:  See HPI  Physical Exam:  Filed Vitals:   04/12/15 1345  BP: 144/48  Pulse: 63    Incision:  Well healed l;eft leg Extremities:  Left LE edema min to moderate with improvement since last visit per patient. Foot ulcers left:  Healing well  Assessment/Plan:  This is a 69 y.o. female who is s/p: femoral to posterior tibial bypass graft with healing wounds left foot  She will f/u in 4 weeks Continue daily dressing changes to the left foot ulcers.  She may shower daily.  Elevation of the left LE is encouraged when at rest.   Mosetta Pigeon PA-C [facility] [phone]  Clinic MD:  Pt seen and examined with Dr. Darrick Penna   History and exam findings as above. Patent bypass graft although still has left lower extremity edema. Wounds on her left foot are continuing to heal. Remainder staples were removed today. She will follow-up in one month's time to  recheck wounds on her left foot.  Fabienne Bruns, MD [facility] [place] Office: [phone] Pager: [phone]

## 2015-04-16 ENCOUNTER — Encounter: Payer: Self-pay | Admitting: Vascular Surgery

## 2015-05-02 ENCOUNTER — Encounter: Payer: Self-pay | Admitting: Vascular Surgery

## 2015-05-02 ENCOUNTER — Telehealth: Payer: Self-pay | Admitting: Vascular Surgery

## 2015-05-02 NOTE — Telephone Encounter (Signed)
-----   Message from Phillips Odorarol S Pullins, RN sent at 05/02/2015  2:08 PM EDT ----- Regarding: needs appt. 5/12 Please move her f/u appt. to tomorrow (5/12) @ 9:15 AM with [facility].  Her daughter reported a retained staple in left lower leg; s/p left fem-PT BP 03/06/15. (I have given her daughter the appt. Time.) Thanks.

## 2015-05-03 ENCOUNTER — Ambulatory Visit (INDEPENDENT_AMBULATORY_CARE_PROVIDER_SITE_OTHER): Payer: Self-pay | Admitting: Vascular Surgery

## 2015-05-03 ENCOUNTER — Encounter: Payer: Self-pay | Admitting: Vascular Surgery

## 2015-05-03 VITALS — BP 181/93 | HR 82 | Ht 67.0 in | Wt 184.0 lb

## 2015-05-03 DIAGNOSIS — I739 Peripheral vascular disease, unspecified: Secondary | ICD-10-CM

## 2015-05-03 NOTE — Progress Notes (Signed)
  POST OPERATIVE OFFICE NOTE    CC:  F/u for surgery  HPI:  This is a 69 y.o. female who is s/p Left femoral to posterior tibial bypass with reverse great saph vein 03/06/2015.  She returns today for a wound check.  Last visit every other staple was removed.  She is currently residing at a skilled nursing facility gaining her strength back.  She reports her left leg and foot are feeling better since the surgery.  She has had a previous right BKA.    Physical Exam:  Filed Vitals:   05/03/15 0926  BP: 181/93  Pulse: 82  Height: 5\' 7"  (1.702 m)  Weight: 184 lb (83.462 kg)  SpO2: 99%    Incision:  Well healed left leg Extremities:  Left LE edema min to moderate with improvement since last visit per patient. Foot ulcers left:  Healing well essentially healed at this point  Assessment/Plan:  This is a 69 y.o. female who is s/p: femoral to posterior tibial bypass graft with healing wounds left foot   Patent bypass graft although still has left lower extremity edema. Wounds on her left foot are continuing to heal. She will follow-up in June for a graft duplex. Patient was given a prescription today for a left lower extremity compression stocking. She will also revisit her prosthetist as her right leg prosthetic is not fitting well.  Fabienne Brunsharles Adelaine Roppolo, MD [facility] [place] Office: [phone] Pager: [phone]

## 2015-05-10 ENCOUNTER — Ambulatory Visit: Payer: Medicare Other | Admitting: Vascular Surgery

## 2015-06-07 ENCOUNTER — Encounter (HOSPITAL_COMMUNITY): Payer: Medicare Other

## 2015-06-07 ENCOUNTER — Ambulatory Visit: Payer: Medicare Other | Admitting: Vascular Surgery

## 2015-06-08 ENCOUNTER — Other Ambulatory Visit: Payer: Self-pay | Admitting: *Deleted

## 2015-06-08 DIAGNOSIS — Z48812 Encounter for surgical aftercare following surgery on the circulatory system: Secondary | ICD-10-CM

## 2015-06-08 DIAGNOSIS — I739 Peripheral vascular disease, unspecified: Secondary | ICD-10-CM

## 2015-06-21 ENCOUNTER — Ambulatory Visit: Payer: Medicare Other | Admitting: Vascular Surgery

## 2015-06-21 ENCOUNTER — Encounter (HOSPITAL_COMMUNITY): Payer: Medicare Other

## 2015-06-21 ENCOUNTER — Other Ambulatory Visit (HOSPITAL_COMMUNITY): Payer: Medicare Other

## 2015-06-28 ENCOUNTER — Telehealth: Payer: Self-pay | Admitting: *Deleted

## 2015-06-28 NOTE — Telephone Encounter (Signed)
Laurie [name], Child psychotherapistsocial worker for [facility] called stating that there will be a delay with the evaluation as she attempted to see today, however the patient was not there.  She will again try to see patient the first of next week.

## 2015-07-03 ENCOUNTER — Encounter: Payer: Self-pay | Admitting: Vascular Surgery

## 2015-07-05 ENCOUNTER — Ambulatory Visit: Payer: Medicare Other | Admitting: Vascular Surgery

## 2015-07-09 ENCOUNTER — Encounter: Payer: Self-pay | Admitting: Vascular Surgery

## 2015-07-12 ENCOUNTER — Ambulatory Visit: Payer: Medicare Other | Admitting: Vascular Surgery

## 2016-05-27 ENCOUNTER — Telehealth: Payer: Self-pay

## 2016-05-27 ENCOUNTER — Other Ambulatory Visit: Payer: Self-pay | Admitting: *Deleted

## 2016-05-27 DIAGNOSIS — Z48812 Encounter for surgical aftercare following surgery on the circulatory system: Secondary | ICD-10-CM

## 2016-05-27 DIAGNOSIS — I739 Peripheral vascular disease, unspecified: Secondary | ICD-10-CM

## 2016-05-27 NOTE — Telephone Encounter (Signed)
Rec'd phone call from pt's daughter.  Reported she needs to move the August appt. to an earlier date, "due to the condition of her left foot."  Stated pt. had ulcers at one time on several toes left foot, that healed up.  Reported the previous ulcer areas have a dark discoloration.  Stated the pt. Has sensation and the ability to move her left foot.  Reported that for approx. 2 mos. The pt. has had increased swelling of the left LE, involving the foot, leg and just above knee.  Stated the PCP was aware of the swelling.   Reported the pt. does not comply with elevating her legs.  Also, stated "her left foot stays cooler, compared to temperature of right leg."  (hx right BKA)  Daughter stated the pt. Had c/o left leg pain over the past 2 mos., but has procrastinated in getting this addressed.  Stated she doesn't walk much now, and won't use her prosthesis.  Noted that there have been several appts. previously made, that were either cancelled, or the pt. Had not shown up for.  Advised daughter will contact her to give options for earlier appt.  Advised if leg symptoms worsen, to call back or take to the ER.

## 2016-05-28 ENCOUNTER — Encounter: Payer: Self-pay | Admitting: Vascular Surgery

## 2016-05-28 ENCOUNTER — Other Ambulatory Visit (HOSPITAL_COMMUNITY): Payer: Medicare Other

## 2016-05-28 ENCOUNTER — Encounter (HOSPITAL_COMMUNITY): Payer: Medicare Other

## 2016-05-29 ENCOUNTER — Ambulatory Visit: Payer: Medicare Other | Admitting: Vascular Surgery

## 2016-06-09 ENCOUNTER — Ambulatory Visit: Payer: Medicare Other | Admitting: Sports Medicine

## 2016-06-10 ENCOUNTER — Telehealth: Payer: Self-pay | Admitting: Neurology

## 2016-06-10 ENCOUNTER — Telehealth: Payer: Self-pay

## 2016-06-10 NOTE — Telephone Encounter (Signed)
Patient's daughter is calling to make an appt for her mother. She states her narcolepsy is getting worse.  She has fallen 2 times within the last week. Could you please call her and work patient in? Thanks!

## 2016-06-10 NOTE — Telephone Encounter (Signed)
LM stating that we have opening tomorrow at 10am. Left call back number if interested, otherwise we can get her in next available.

## 2016-06-10 NOTE — Telephone Encounter (Signed)
Discussed pt's symptoms with Dr. Arbie CookeyEarly.  Recommended to schedule the pt. for arterial studies (ABI's, and Duplex of (L) LE BP Graft) and office appt. with Dr. Darrick PennaFields.   Will have an Appt. Scheduler call pt's daughter with an appt. date/time.

## 2016-06-10 NOTE — Telephone Encounter (Signed)
rec'd call from PCP office.  Reported that the pt. called their office due to increased fluid build-up of left leg, as was noted by her Prosthetic Technician, during an appt. with [employer] this AM.  Stated that Dr. Mikeal HawthorneGarba is not available and requested that pt. be evaluated in vasc. surgery office ASAP.   Reported that the pt. was ordered to start Furosemide 40 mg daily.  Phone call to the pt.  Spoke with her daughter, Shanda BumpsJessica.  Reported her mother's swelling in the left LE "has been going on for awhile."  Denied any SOB.  Reported there has been a change in color of her left foot, across the top of toes; stated "it looks dark, like a bruise."  Reported there is increased sensitivity to touch, and that any movement causes discomfort.  Reported she doesn't walk, and is in chair much of the time.  Reported that from her (L) foot toes, up to the ankle has been red x 1 mo.  Denied any open sores.  Stated there is dry skin in area where old ulcers were.  Also reported the right BKA stump has some swelling; [employer] has applied a stump shrinker.  Advised daughter will discuss with MD, and call back with appt. Info.

## 2016-06-11 ENCOUNTER — Encounter: Payer: Self-pay | Admitting: Podiatry

## 2016-06-11 ENCOUNTER — Ambulatory Visit (INDEPENDENT_AMBULATORY_CARE_PROVIDER_SITE_OTHER): Payer: Medicare Other | Admitting: Podiatry

## 2016-06-11 VITALS — BP 163/87 | HR 66 | Resp 12

## 2016-06-11 DIAGNOSIS — I739 Peripheral vascular disease, unspecified: Secondary | ICD-10-CM | POA: Diagnosis not present

## 2016-06-11 DIAGNOSIS — B351 Tinea unguium: Secondary | ICD-10-CM

## 2016-06-11 DIAGNOSIS — R609 Edema, unspecified: Secondary | ICD-10-CM | POA: Diagnosis not present

## 2016-06-11 DIAGNOSIS — E0842 Diabetes mellitus due to underlying condition with diabetic polyneuropathy: Secondary | ICD-10-CM | POA: Diagnosis not present

## 2016-06-11 DIAGNOSIS — M79675 Pain in left toe(s): Secondary | ICD-10-CM

## 2016-06-11 DIAGNOSIS — R600 Localized edema: Secondary | ICD-10-CM

## 2016-06-11 NOTE — Telephone Encounter (Signed)
As of right now we do not have any openings. I will call when we do have one.

## 2016-06-11 NOTE — Progress Notes (Signed)
   Subjective:    Patient ID: Laurie [name], female    DOB: [birth year], 70 y.o.   MRN: [id number]  HPI    This patient presents today describing a 4 month history of a swollen left foot with the edema persistent on and off weightbearing patient's primary care physician as advised present at the symptoms are most consistent with peripheral edema and has prescribed a diuretic according to the patient. Patient is here for a second opinion. The patient's aid is present in the treatment room. Also, patient is requesting debridement of toenails on the left foot  Patient diabetic with a history of BK amputation right  Review of Systems  Cardiovascular: Positive for leg swelling.  Musculoskeletal: Positive for gait problem.       Objective:   Physical Exam  Orientated 3  Vascular: Mild pitting edema left ankle and foot No calf pain or calf tenderness left DP left 1/4 PT left 0/4 Capillary reflex delayed left  Neurological: Sensation to 10 g monofilament wire intact 1/5 left Vibratory sensation nonreactive left Ankle reflexes nonreactive left  Dermatological: No open skin lesions left Surgical scars medial left lower leg ankle and anterior left ankle Toenails 1-5 left are elongated, deformed, discolored  Musculoskeletal: BK amputation right       Assessment & Plan:   Assessment: Peripheral edema left Decreased pulsation consistent with known history of peripheral arterial disease 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 I advised her to follow the advice of their primary care physician and use any diuretics prescribed.  Debrided toenails 1-5 left mechanically and electrically without any bleeding  Reappoint as needed or at yearly intervals

## 2016-06-11 NOTE — Telephone Encounter (Signed)
Daughter Laurie [name] [phone] called to advise, she appreciates working Mom in for appointment this morning, will be unable to do this morning, does have a friend who could bring her this afternoon.

## 2016-06-11 NOTE — Patient Instructions (Signed)
Wear a compression hose on the left lower leg knee length as prescribed (20-30 )  Diabetes and Foot Care Diabetes may cause you to have problems because of poor blood supply (circulation) to your feet and legs. This may cause the skin on your feet to become thinner, break easier, and heal more slowly. Your skin may become dry, and the skin may peel and crack. You may also have nerve damage in your legs and feet causing decreased feeling in them. You may not notice minor injuries to your feet that could lead to infections or more serious problems. Taking care of your feet is one of the most important things you can do for yourself.  HOME CARE INSTRUCTIONS  Wear shoes at all times, even in the house. Do not go barefoot. Bare feet are easily injured.  Check your feet daily for blisters, cuts, and redness. If you cannot see the bottom of your feet, use a mirror or ask someone for help.  Wash your feet with warm water (do not use hot water) and mild soap. Then pat your feet and the areas between your toes until they are completely dry. Do not soak your feet as this can dry your skin.  Apply a moisturizing lotion or petroleum jelly (that does not contain alcohol and is unscented) to the skin on your feet and to dry, brittle toenails. Do not apply lotion between your toes.  Trim your toenails straight across. Do not dig under them or around the cuticle. File the edges of your nails with an emery board or nail file.  Do not cut corns or calluses or try to remove them with medicine.  Wear clean socks or stockings every day. Make sure they are not too tight. Do not wear knee-high stockings since they may decrease blood flow to your legs.  Wear shoes that fit properly and have enough cushioning. To break in new shoes, wear them for just a few hours a day. This prevents you from injuring your feet. Always look in your shoes before you put them on to be sure there are no objects inside.  Do not cross your  legs. This may decrease the blood flow to your feet.  If you find a minor scrape, cut, or break in the skin on your feet, keep it and the skin around it clean and dry. These areas may be cleansed with mild soap and water. Do not cleanse the area with peroxide, alcohol, or iodine.  When you remove an adhesive bandage, be sure not to damage the skin around it.  If you have a wound, look at it several times a day to make sure it is healing.  Do not use heating pads or hot water bottles. They may burn your skin. If you have lost feeling in your feet or legs, you may not know it is happening until it is too late.  Make sure your health care provider performs a complete foot exam at least annually or more often if you have foot problems. Report any cuts, sores, or bruises to your health care provider immediately. SEEK MEDICAL CARE IF:   You have an injury that is not healing.  You have cuts or breaks in the skin.  You have an ingrown nail.  You notice redness on your legs or feet.  You feel burning or tingling in your legs or feet.  You have pain or cramps in your legs and feet.  Your legs or feet are numb.  Your feet always feel cold. SEEK IMMEDIATE MEDICAL CARE IF:   There is increasing redness, swelling, or pain in or around a wound.  There is a red line that goes up your leg.  Pus is coming from a wound.  You develop a fever or as directed by your health care provider.  You notice a bad smell coming from an ulcer or wound.   This information is not intended to replace advice given to you by your health care provider. Make sure you discuss any questions you have with your health care provider.   Document Released: 12/05/2000 Document Revised: 08/10/2013 Document Reviewed: 05/17/2013 Elsevier Interactive Patient Education [employer].

## 2016-06-11 NOTE — Telephone Encounter (Signed)
Sched abi 6/22 at 1, le bypass graft 6/23 at 2, and md 6/29 at 10:30. Lm on duaghter's # to inform them of appts.

## 2016-06-12 ENCOUNTER — Encounter (HOSPITAL_COMMUNITY): Payer: Medicare Other

## 2016-06-12 ENCOUNTER — Encounter: Payer: Self-pay | Admitting: Vascular Surgery

## 2016-06-12 NOTE — Telephone Encounter (Signed)
Has appt for 7/12. As of right now there is nothing sooner.

## 2016-06-13 ENCOUNTER — Ambulatory Visit (HOSPITAL_COMMUNITY)
Admission: RE | Admit: 2016-06-13 | Discharge: 2016-06-13 | Disposition: A | Discharge status: Home / Self Care | Payer: Medicare Other | Source: Ambulatory Visit | Attending: Vascular Surgery | Admitting: Vascular Surgery

## 2016-06-13 ENCOUNTER — Ambulatory Visit (INDEPENDENT_AMBULATORY_CARE_PROVIDER_SITE_OTHER)
Admission: RE | Admit: 2016-06-13 | Discharge: 2016-06-13 | Disposition: A | Discharge status: Home / Self Care | Payer: Medicare Other | Source: Ambulatory Visit | Attending: Vascular Surgery | Admitting: Vascular Surgery

## 2016-06-13 DIAGNOSIS — Z89511 Acquired absence of right leg below knee: Secondary | ICD-10-CM | POA: Insufficient documentation

## 2016-06-13 DIAGNOSIS — Z48812 Encounter for surgical aftercare following surgery on the circulatory system: Secondary | ICD-10-CM | POA: Insufficient documentation

## 2016-06-13 DIAGNOSIS — I739 Peripheral vascular disease, unspecified: Secondary | ICD-10-CM | POA: Diagnosis not present

## 2016-06-16 NOTE — Telephone Encounter (Signed)
LM for Shanda BumpsJessica to call back. We do have an opening tomorrow morning. Left date and time, asked her to call back if she wants it and hopefully it will still be open by then.

## 2016-06-17 ENCOUNTER — Telehealth: Payer: Self-pay | Admitting: *Deleted

## 2016-06-17 NOTE — Telephone Encounter (Signed)
Lt message unable to reach the daughter.

## 2016-06-19 ENCOUNTER — Emergency Department (HOSPITAL_COMMUNITY)
Admission: EM | Admit: 2016-06-19 | Discharge: 2016-06-20 | Disposition: A | Discharge status: Home / Self Care | Payer: Medicare Other | Attending: Emergency Medicine | Admitting: Emergency Medicine

## 2016-06-19 ENCOUNTER — Encounter: Payer: Self-pay | Admitting: Vascular Surgery

## 2016-06-19 ENCOUNTER — Ambulatory Visit (INDEPENDENT_AMBULATORY_CARE_PROVIDER_SITE_OTHER): Payer: Medicare Other | Admitting: Vascular Surgery

## 2016-06-19 VITALS — BP 176/84 | HR 74 | Temp 97.2°F | Resp 16 | Ht 68.0 in | Wt 185.0 lb

## 2016-06-19 DIAGNOSIS — I10 Essential (primary) hypertension: Secondary | ICD-10-CM | POA: Insufficient documentation

## 2016-06-19 DIAGNOSIS — Y939 Activity, unspecified: Secondary | ICD-10-CM | POA: Insufficient documentation

## 2016-06-19 DIAGNOSIS — Y999 Unspecified external cause status: Secondary | ICD-10-CM | POA: Insufficient documentation

## 2016-06-19 DIAGNOSIS — W010XXA Fall on same level from slipping, tripping and stumbling without subsequent striking against object, initial encounter: Secondary | ICD-10-CM | POA: Insufficient documentation

## 2016-06-19 DIAGNOSIS — E119 Type 2 diabetes mellitus without complications: Secondary | ICD-10-CM | POA: Diagnosis not present

## 2016-06-19 DIAGNOSIS — Z794 Long term (current) use of insulin: Secondary | ICD-10-CM | POA: Insufficient documentation

## 2016-06-19 DIAGNOSIS — Y929 Unspecified place or not applicable: Secondary | ICD-10-CM | POA: Diagnosis not present

## 2016-06-19 DIAGNOSIS — Z792 Long term (current) use of antibiotics: Secondary | ICD-10-CM | POA: Insufficient documentation

## 2016-06-19 DIAGNOSIS — Z7982 Long term (current) use of aspirin: Secondary | ICD-10-CM | POA: Diagnosis not present

## 2016-06-19 DIAGNOSIS — Z8673 Personal history of transient ischemic attack (TIA), and cerebral infarction without residual deficits: Secondary | ICD-10-CM | POA: Diagnosis not present

## 2016-06-19 DIAGNOSIS — M25552 Pain in left hip: Secondary | ICD-10-CM | POA: Diagnosis present

## 2016-06-19 DIAGNOSIS — T148XXA Other injury of unspecified body region, initial encounter: Secondary | ICD-10-CM

## 2016-06-19 DIAGNOSIS — S7002XA Contusion of left hip, initial encounter: Secondary | ICD-10-CM | POA: Insufficient documentation

## 2016-06-19 DIAGNOSIS — I739 Peripheral vascular disease, unspecified: Secondary | ICD-10-CM

## 2016-06-19 DIAGNOSIS — F329 Major depressive disorder, single episode, unspecified: Secondary | ICD-10-CM | POA: Diagnosis not present

## 2016-06-19 DIAGNOSIS — F17210 Nicotine dependence, cigarettes, uncomplicated: Secondary | ICD-10-CM | POA: Insufficient documentation

## 2016-06-19 NOTE — Progress Notes (Signed)
    HPI:  This is a 70 y.o. female who is s/p Left femoral to posterior tibial bypass with reverse great saph vein 03/06/2015.  She returns today for follow-up . She has had a previous right BKA. She is nonambulatory. 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 She has never been fitted for prosthetic on the right leg. She is fairly unstable and has had several falls. She does not complain of rest pain or ulcers in the left foot. She has chronic edema in the left leg.    Physical Exam:    Filed Vitals:   06/19/16 1108 06/19/16 1113  BP: 178/84 176/84  Pulse: 74 74  Temp: 97.2 F (36.2 C)   TempSrc: Oral   Resp: 16   Height:  5\' 8"  (1.727 m)  Weight:  185 lb (83.915 kg)  SpO2: 100%     Incision:  Well healed left leg Extremities:  Left LE edema min to moderate seems to be dependent at this point  Patient had a left leg ABI performed on 06/13/2016 which was 0.71 with a toe pressure of 88 monophasic waveforms  Assessment/Plan:  70 year old female status post left femoral posterior tibial bypass has adequate perfusion to the left leg. She is nonambulatory at this point. She will continue to try to work on the left leg contracture. She will also consider whether or not to get fitted for prosthetic for the right leg. She will follow-up with me in one year for the graft duplex and ABIs. She was counseled to elevate her left leg is much as possible since she is mainly sitting in wheelchair in the dependency is probably increasing her edema.  Fabienne Brunsharles Rhodesia Stanger, MD [facility] Office: [phone] Pager: [phone]

## 2016-06-20 ENCOUNTER — Emergency Department (HOSPITAL_COMMUNITY): Payer: Medicare Other

## 2016-06-20 ENCOUNTER — Encounter (HOSPITAL_COMMUNITY): Payer: Self-pay | Admitting: Emergency Medicine

## 2016-06-20 MED ORDER — DICLOFENAC SODIUM 1 % TD GEL: 4.0000 g | Freq: Four times a day (QID) | TRANSDERMAL | Status: AC

## 2016-06-20 MED ORDER — KETOROLAC TROMETHAMINE 60 MG/2ML IM SOLN
60.0000 mg | Freq: Once | INTRAMUSCULAR | Status: AC
  Administered 2016-06-20: 60 mg via INTRAMUSCULAR
  Filled 2016-06-20: qty 2

## 2016-06-20 NOTE — ED Provider Notes (Signed)
CSN: [id number]     Arrival date & time 06/19/16  2346 History  By signing my name below, I, Rosario AdieWilliam Andrew Hiatt, attest that this documentation has been prepared under the direction and in the presence of Aki Abalos, MD.  Electronically Signed: Rosario AdieWilliam Andrew Hiatt, ED Scribe. 06/20/2016. 3:11 AM.    Chief Complaint  Patient presents with  . Fall  . Hip Pain   Patient is a 70 y.o. female presenting with fall. The history is provided by the patient. No language interpreter was used.  Fall This is a new problem. The current episode started 12 to 24 hours ago. The problem occurs constantly. The problem has not changed since onset.Pertinent negatives include no chest pain, no abdominal pain, no headaches and no shortness of breath. Nothing aggravates the symptoms. Nothing relieves the symptoms. She has tried nothing for the symptoms. The treatment provided no relief.   HPI Comments: Laurie [name] is a 70 y.o. female with a PMHx significant for HTN, anemia, and DM2 and right BKA amputation who presents to the Emergency Department complaining of sudden onset, gradually worsening, constant left sided hip pain s/p ground level fall that occurred PTA. Pt reports that she was getting out of her bathtub which was slippery and trying to get into her wheelchair at the time of her fall. She denies head injury or LOC. Her pain is worsened upon ambulation. She currently takes Oxycodone at home, but did not take it PTA. Pt is currently taking Plavix. She denies any other injuries at this time.  Past Medical History  Diagnosis Date  . Hypertension   . Chest pain     Troponins up to 0.12 while hospitalized for E.coli PNA  . 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 Bacteremia   . Anemia   . Narcolepsy   . Family history of adverse reaction to anesthesia     "brother died 06/24/2013; woke up w/headache, vomited blood; drilled holes in his head; bleeding stroke; never came back" (03/05/2015)   . High cholesterol   . Type II diabetes mellitus (HCC)   . Walking pneumonia 04/2009    "double" Hattie Perch/notes 04/23/2011  . Stroke [facility](HCC) 10/2014    "weaker on left side since" (03/05/2015)  . Headache     "when someone gets on my nerves" (03/05/2015)  . Bursitis     "left hip; sometimes left shoulder" (03/05/2015)  . Anxiety   . Depression    Past Surgical History  Procedure Laterality Date  . Amputation Right 11/03/2014    Procedure: AMPUTATION BELOW KNEE;  Surgeon: Sherren Kernsharles E Fields, MD;  [facility];  Service: Vascular;  Laterality: Right;  . Lower extremity angiogram N/A 10/03/2014    Procedure: LOWER EXTREMITY ANGIOGRAM;  Surgeon: Pamella PertJagadeesh R Ganji, MD;  [facility];  Service: Cardiovascular;  Laterality: N/A;  . Abdominal aortagram N/A 03/05/2015    Procedure: ABDOMINAL Ronny FlurryAORTAGRAM;  Surgeon: Chuck Hinthristopher S Dickson, MD;  [facility];  Service: Cardiovascular;  Laterality: N/A;  . Femoral-tibial bypass graft Left 03/06/2015    Procedure: BYPASS GRAFT Left FEMORAL-POSTERIOR TIBIAL ARTERY with reversed saphenous vein;  Surgeon: Sherren Kernsharles E Fields, MD;  [facility];  Service: Vascular;  Laterality: Left;  . Intraoperative arteriogram Left 03/06/2015    Procedure: INTRA OPERATIVE ARTERIOGRAM;  Surgeon: Sherren Kernsharles E Fields, MD;  [facility];  Service: Vascular;  Laterality: Left;   Family History  Problem Relation Age of Onset  .  CAD Mother   . CAD Father    Social History  Substance Use Topics  . Smoking status: Current Every Day Smoker -- 0.10 packs/day for 40 years    Types: Cigarettes  . Smokeless tobacco: Never Used  . Alcohol Use: 0.0 oz/week    0 Standard drinks or equivalent per week     Comment: 03/05/2015 "Might have a drink on holidays"   OB History    No data available     Review of Systems  Respiratory: Negative for shortness of breath.   Cardiovascular: Negative for chest pain.  Gastrointestinal: Negative for abdominal pain.  Musculoskeletal:  Positive for arthralgias.       Positive for left hip pain.  Neurological: Negative for syncope and headaches.  All other systems reviewed and are negative.  Allergies  Penicillins  Home Medications   Prior to Admission medications   Medication Sig Start Date End Date Taking? Authorizing Provider  ALPRAZolam Prudy Feeler) 0.25 MG tablet Take one tablet by mouth twice daily as needed for anxiety Patient taking differently: Take 0.25 mg by mouth 2 (two) times daily. Take one tablet by mouth twice daily as needed for anxiety 11/10/14   Tiffany L Reed, DO  amphetamine-dextroamphetamine (ADDERALL) 10 MG tablet Take 10 mg by mouth 2 (two) times daily with a meal.    Historical Provider, MD  aspirin EC 81 MG tablet Take 81 mg by mouth daily.    Historical Provider, MD  atorvastatin (LIPITOR) 10 MG tablet Take 1 tablet (10 mg total) by mouth daily at 6 PM. 11/04/13   Evlyn Kanner Love, PA-C  cephALEXin (KEFLEX) 500 MG capsule Take 1 capsule (500 mg total) by mouth 3 (three) times daily. 01/30/15   Vivi Barrack, DPM  Cholecalciferol (VITAMIN D3 PO) Take 1 tablet by mouth daily.    Historical Provider, MD  cilostazol (PLETAL) 100 MG tablet Take 1 tablet (100 mg total) by mouth 2 (two) times daily. 10/03/14   Yates Decamp, MD  citalopram (CELEXA) 10 MG tablet Take 10 mg by mouth daily.    Historical Provider, MD  clopidogrel (PLAVIX) 75 MG tablet Take 75 mg by mouth daily.    Historical Provider, MD  Coenzyme Q10 (COQ10 PO) Take 1 capsule by mouth daily.    Historical Provider, MD  DULoxetine (CYMBALTA) 60 MG capsule Take 60 mg by mouth daily.    Historical Provider, MD  gabapentin (NEURONTIN) 300 MG capsule Take 1 capsule (300 mg total) by mouth 2 (two) times daily. Patient taking differently: Take 300 mg by mouth 3 (three) times daily.  03/13/14   Erick Colace, MD  hydrochlorothiazide (MICROZIDE) 12.5 MG capsule Take 1 capsule (12.5 mg total) by mouth daily. 11/07/14   Jeralyn Bennett, MD   HYDROcodone-acetaminophen (NORCO/VICODIN) 5-325 MG per tablet Take one tablet by mouth every 6 hours as needed for pain 11/21/14   Oneal Grout, MD  insulin glargine (LANTUS) 100 UNIT/ML injection Inject 0.2 mLs (20 Units total) into the skin at bedtime. 11/07/14   Jeralyn Bennett, MD  lisinopril (PRINIVIL,ZESTRIL) 20 MG tablet Take 1 tablet (20 mg total) by mouth daily. 11/07/14   Jeralyn Bennett, MD  modafinil (PROVIGIL) 200 MG tablet Take one tablet by mouth twice daily for narcolepsy 01/09/15   Oneal Grout, MD  oxyCODONE-acetaminophen (PERCOCET/ROXICET) 5-325 MG per tablet Take 1-2 tablets by mouth every 6 (six) hours as needed for severe pain. 03/10/15   Raymond Gurney, PA-C  SANTYL ointment Apply 1 application topically daily.  10/26/14   Historical Provider, MD  silver sulfADIAZINE (SILVADENE) 1 % cream Apply 1 application topically daily. 01/30/15   Vivi Barrack, DPM  sulfamethoxazole-trimethoprim (SEPTRA DS) 800-160 MG per tablet Take 1 tablet by mouth 2 (two) times daily. 02/23/15   Heather Laisure, PA-C   BP 179/86 mmHg  Pulse 81  Temp(Src) 99.3 F (37.4 C) (Oral)  Resp 18  SpO2 100%   Physical Exam  Constitutional: She is oriented to person, place, and time. She appears well-developed and well-nourished. No distress.  HENT:  Head: Normocephalic and atraumatic. Head is without raccoon's eyes and without Battle's sign.  Right Ear: No hemotympanum.  Left Ear: No hemotympanum.  Mouth/Throat: Oropharynx is clear and moist. No oropharyngeal exudate.  Eyes: Conjunctivae and EOM are normal. Pupils are equal, round, and reactive to light.  Neck: Trachea normal and normal range of motion. Neck supple. No JVD present. Carotid bruit is not present.  Cardiovascular: Normal rate, regular rhythm and normal heart sounds.  Exam reveals no gallop and no friction rub.   Pulses:      Dorsalis pedis pulses are 2+ on the right side, and 2+ on the left side.  Pulmonary/Chest: Effort normal and  breath sounds normal. No stridor. She has no wheezes. She has no rales.  Abdominal: Soft. Bowel sounds are normal. There is no tenderness. There is no rebound and no guarding.  Musculoskeletal:       Left hip: Normal. She exhibits normal range of motion, normal strength, no tenderness, no bony tenderness, no swelling, no crepitus, no deformity and no laceration.       Left knee: She exhibits no swelling, no effusion, no ecchymosis, no deformity, no laceration, no erythema, normal alignment, no LCL laxity, normal patellar mobility, no bony tenderness, normal meniscus and no MCL laxity. No tenderness found. No medial joint line, no lateral joint line, no MCL, no LCL and no patellar tendon tenderness noted.       Left ankle: Normal. Achilles tendon normal.       Left upper leg: Normal.       Left lower leg: Normal.  All compartments are soft. No effusion. No patella of the alta or baha. No foreshortening or rotation of the hip.  Lymphadenopathy:    She has no cervical adenopathy.  Neurological: She is alert and oriented to person, place, and time. She has normal reflexes. No cranial nerve deficit. She exhibits normal muscle tone. Coordination normal.  Skin: Skin is warm and dry. She is not diaphoretic.  Ecchymosis the size of a nickel noted on her left hip.  Psychiatric: She has a normal mood and affect. Her behavior is normal.  Nursing note and vitals reviewed.  ED Course  Procedures (including critical care time)  DIAGNOSTIC STUDIES: Oxygen Saturation is 100% on RA, normal by my interpretation.   COORDINATION OF CARE: 3:09 AM-Discussed next steps with pt. Pt verbalized understanding and is agreeable with the plan.   Imaging Review Dg Hip Unilat With Pelvis 2-3 Views Left  06/20/2016  CLINICAL DATA:  Larey Seat tonight getting in the bathtub, now with posterior hip pain. EXAM: DG HIP (WITH OR WITHOUT PELVIS) 2-3V LEFT COMPARISON:  None. FINDINGS: Negative for fracture dislocation. Mild  arthritic changes are present about the hip. There is no bone lesion or bony destruction. Pubic symphysis and sacroiliac joints appear unremarkable. IMPRESSION: Negative for acute fracture or dislocation. Mild left hip arthritic changes. Electronically Signed   By: Ellery Plunk M.D.   On:  06/20/2016 01:24   I have personally reviewed and evaluated these images and lab results as part of my medical decision-making.  MDM   Final diagnoses:  None   Filed Vitals:   06/20/16 0005 06/20/16 0403  BP: 179/86 175/76  Pulse: 81 84  Temp: 99.3 F (37.4 C)   Resp: 18 18   Results for orders placed or performed during the hospital encounter of 03/05/15  MRSA PCR Screening  Result Value Ref Range   MRSA by PCR NEGATIVE NEGATIVE  Glucose, capillary  Result Value Ref Range   Glucose-Capillary 117 (H) 70 - 99 mg/dL  CBC  Result Value Ref Range   WBC 7.8 4.0 - 10.5 K/uL   RBC 3.64 (L) 3.87 - 5.11 MIL/uL   Hemoglobin 11.8 (L) 12.0 - 15.0 g/dL   HCT 16.1 (L) 09.6 - 04.5 %   MCV 97.3 78.0 - 100.0 fL   MCH 32.4 26.0 - 34.0 pg   MCHC 33.3 30.0 - 36.0 g/dL   RDW 40.9 81.1 - 91.4 %   Platelets 261 150 - 400 K/uL  Creatinine, serum  Result Value Ref Range   Creatinine, Ser 0.92 0.50 - 1.10 mg/dL   GFR calc non Af Amer 63 (L) >90 mL/min   GFR calc Af Amer 74 (L) >90 mL/min  Basic metabolic panel   Result Value Ref Range   Sodium 139 135 - 145 mmol/L   Potassium 4.0 3.5 - 5.1 mmol/L   Chloride 109 96 - 112 mmol/L   CO2 23 19 - 32 mmol/L   Glucose, Bld 110 (H) 70 - 99 mg/dL   BUN 20 6 - 23 mg/dL   Creatinine, Ser 7.82 0.50 - 1.10 mg/dL   Calcium 8.7 8.4 - 95.6 mg/dL   GFR calc non Af Amer 60 (L) >90 mL/min   GFR calc Af Amer 70 (L) >90 mL/min   Anion gap 7 5 - 15  CBC  Result Value Ref Range   WBC 9.3 4.0 - 10.5 K/uL   RBC 3.59 (L) 3.87 - 5.11 MIL/uL   Hemoglobin 11.3 (L) 12.0 - 15.0 g/dL   HCT 21.3 (L) 08.6 - 57.8 %   MCV 98.1 78.0 - 100.0 fL   MCH 31.5 26.0 - 34.0 pg   MCHC  32.1 30.0 - 36.0 g/dL   RDW 46.9 62.9 - 52.8 %   Platelets 231 150 - 400 K/uL  Glucose, capillary  Result Value Ref Range   Glucose-Capillary 229 (H) 70 - 99 mg/dL  Glucose, capillary  Result Value Ref Range   Glucose-Capillary 108 (H) 70 - 99 mg/dL  Glucose, capillary  Result Value Ref Range   Glucose-Capillary 141 (H) 70 - 99 mg/dL  CBC  Result Value Ref Range   WBC 10.4 4.0 - 10.5 K/uL   RBC 2.50 (L) 3.87 - 5.11 MIL/uL   Hemoglobin 8.0 (L) 12.0 - 15.0 g/dL   HCT 41.3 (L) 24.4 - 01.0 %   MCV 98.4 78.0 - 100.0 fL   MCH 32.0 26.0 - 34.0 pg   MCHC 32.5 30.0 - 36.0 g/dL   RDW 27.2 53.6 - 64.4 %   Platelets 179 150 - 400 K/uL  Basic metabolic panel  Result Value Ref Range   Sodium 136 135 - 145 mmol/L   Potassium 4.5 3.5 - 5.1 mmol/L   Chloride 107 96 - 112 mmol/L   CO2 22 19 - 32 mmol/L   Glucose, Bld 222 (H) 70 - 99 mg/dL  BUN 18 6 - 23 mg/dL   Creatinine, Ser 1.610.90 0.50 - 1.10 mg/dL   Calcium 7.9 (L) 8.4 - 10.5 mg/dL   GFR calc non Af Amer 65 (L) >90 mL/min   GFR calc Af Amer 76 (L) >90 mL/min   Anion gap 7 5 - 15  Glucose, capillary  Result Value Ref Range   Glucose-Capillary 218 (H) 70 - 99 mg/dL  Glucose, capillary  Result Value Ref Range   Glucose-Capillary 165 (H) 70 - 99 mg/dL   Comment 1 Notify RN   Glucose, capillary  Result Value Ref Range   Glucose-Capillary 161 (H) 70 - 99 mg/dL  Glucose, capillary  Result Value Ref Range   Glucose-Capillary 140 (H) 70 - 99 mg/dL  Glucose, capillary  Result Value Ref Range   Glucose-Capillary 144 (H) 70 - 99 mg/dL  CBC  Result Value Ref Range   WBC 10.3 4.0 - 10.5 K/uL   RBC 2.69 (L) 3.87 - 5.11 MIL/uL   Hemoglobin 8.5 (L) 12.0 - 15.0 g/dL   HCT 09.626.0 (L) 04.536.0 - 40.946.0 %   MCV 96.7 78.0 - 100.0 fL   MCH 31.6 26.0 - 34.0 pg   MCHC 32.7 30.0 - 36.0 g/dL   RDW 81.114.2 91.411.5 - 78.215.5 %   Platelets 175 150 - 400 K/uL  Basic metabolic panel  Result Value Ref Range   Sodium 136 135 - 145 mmol/L   Potassium 4.0 3.5 - 5.1  mmol/L   Chloride 104 96 - 112 mmol/L   CO2 24 19 - 32 mmol/L   Glucose, Bld 131 (H) 70 - 99 mg/dL   BUN 20 6 - 23 mg/dL   Creatinine, Ser 9.560.98 0.50 - 1.10 mg/dL   Calcium 8.7 8.4 - 21.310.5 mg/dL   GFR calc non Af Amer 59 (L) >90 mL/min   GFR calc Af Amer 68 (L) >90 mL/min   Anion gap 8 5 - 15  Glucose, capillary  Result Value Ref Range   Glucose-Capillary 117 (H) 70 - 99 mg/dL   Comment 1 Notify RN    Comment 2 Document in Chart   Glucose, capillary  Result Value Ref Range   Glucose-Capillary 121 (H) 70 - 99 mg/dL   Comment 1 Notify RN    Comment 2 Documented in Char   Glucose, capillary  Result Value Ref Range   Glucose-Capillary 144 (H) 70 - 99 mg/dL  Glucose, capillary  Result Value Ref Range   Glucose-Capillary 159 (H) 70 - 99 mg/dL   Comment 1 Notify RN    Comment 2 Document in Chart   Glucose, capillary  Result Value Ref Range   Glucose-Capillary 94 70 - 99 mg/dL  CBC  Result Value Ref Range   WBC 8.2 4.0 - 10.5 K/uL   RBC 3.06 (L) 3.87 - 5.11 MIL/uL   Hemoglobin 9.4 (L) 12.0 - 15.0 g/dL   HCT 08.628.1 (L) 57.836.0 - 46.946.0 %   MCV 91.8 78.0 - 100.0 fL   MCH 30.7 26.0 - 34.0 pg   MCHC 33.5 30.0 - 36.0 g/dL   RDW 62.914.9 52.811.5 - 41.315.5 %   Platelets 165 150 - 400 K/uL  Basic metabolic panel  Result Value Ref Range   Sodium 134 (L) 135 - 145 mmol/L   Potassium 3.8 3.5 - 5.1 mmol/L   Chloride 102 96 - 112 mmol/L   CO2 26 19 - 32 mmol/L   Glucose, Bld 68 (L) 70 - 99 mg/dL   BUN 14  6 - 23 mg/dL   Creatinine, Ser 1.61 0.50 - 1.10 mg/dL   Calcium 8.4 8.4 - 09.6 mg/dL   GFR calc non Af Amer 55 (L) >90 mL/min   GFR calc Af Amer 64 (L) >90 mL/min   Anion gap 6 5 - 15  Glucose, capillary  Result Value Ref Range   Glucose-Capillary 117 (H) 70 - 99 mg/dL  Hemoglobin and hematocrit, blood  Result Value Ref Range   Hemoglobin 8.5 (L) 12.0 - 15.0 g/dL   HCT 04.5 (L) 40.9 - 81.1 %  Glucose, capillary  Result Value Ref Range   Glucose-Capillary 132 (H) 70 - 99 mg/dL  Glucose,  capillary  Result Value Ref Range   Glucose-Capillary 110 (H) 70 - 99 mg/dL   Comment 1 Notify RN    Comment 2 Document in Chart   Glucose, capillary  Result Value Ref Range   Glucose-Capillary 65 (L) 70 - 99 mg/dL   Comment 1 Notify RN    Comment 2 Document in Chart   Glucose, capillary  Result Value Ref Range   Glucose-Capillary 93 70 - 99 mg/dL  Glucose, capillary  Result Value Ref Range   Glucose-Capillary 62 (L) 70 - 99 mg/dL   Comment 1 Notify RN    Comment 2 Document in Chart   Glucose, capillary  Result Value Ref Range   Glucose-Capillary 119 (H) 70 - 99 mg/dL  I-STAT, chem 8  Result Value Ref Range   Sodium 140 135 - 145 mmol/L   Potassium 4.3 3.5 - 5.1 mmol/L   Chloride 107 96 - 112 mmol/L   BUN 31 (H) 6 - 23 mg/dL   Creatinine, Ser 9.14 0.50 - 1.10 mg/dL   Glucose, Bld 782 (H) 70 - 99 mg/dL   Calcium, Ion 9.56 2.13 - 1.30 mmol/L   TCO2 22 0 - 100 mmol/L   Hemoglobin 12.9 12.0 - 15.0 g/dL   HCT 08.6 57.8 - 46.9 %  Type and screen  Result Value Ref Range   ABO/RH(D) A POS    Antibody Screen NEG    Sample Expiration 03/10/2015    Unit Number [id number]    Blood Component Type RBC, LR IRR    Unit division 00    Status of Unit ISSUED,FINAL    Transfusion Status OK TO TRANSFUSE    Crossmatch Result Compatible    Unit Number [id number]    Blood Component Type RED CELLS,LR    Unit division 00    Status of Unit ISSUED,FINAL    Transfusion Status OK TO TRANSFUSE    Crossmatch Result Compatible    Unit Number [id number]    Blood Component Type RED CELLS,LR    Unit division 00    Status of Unit ISSUED,FINAL    Transfusion Status OK TO TRANSFUSE    Crossmatch Result Compatible   Prepare RBC  Result Value Ref Range   Order Confirmation ORDER PROCESSED BY BLOOD BANK   ABO/Rh  Result Value Ref Range   ABO/RH(D) A POS   Prepare RBC  Result Value Ref Range   Order Confirmation ORDER PROCESSED BY BLOOD BANK    Dg Hip Unilat With Pelvis 2-3 Views  Left  06/20/2016  CLINICAL DATA:  Larey Seat tonight getting in the bathtub, now with posterior hip pain. EXAM: DG HIP (WITH OR WITHOUT PELVIS) 2-3V LEFT COMPARISON:  None. FINDINGS: Negative for fracture dislocation. Mild arthritic changes are present about the hip. There is no bone lesion or bony destruction. Pubic symphysis and  sacroiliac joints appear unremarkable. IMPRESSION: Negative for acute fracture or dislocation. Mild left hip arthritic changes. Electronically Signed   By: Ellery Plunk M.D.   On: 06/20/2016 01:24    Medications  ketorolac (TORADOL) injection 60 mg (60 mg Intramuscular Given 06/20/16 0332)    Stable for discharge at this time.  No foreshortening or rotation has been ambulating on the leg.   Follow up with your PMD for recheck, strict return precautions given  I personally performed the services described in this documentation, which was scribed in my presence. The recorded information has been reviewed and is accurate.       Cy Blamer, MD 06/20/16 [phone]

## 2016-06-20 NOTE — ED Notes (Signed)
Patient presents for fall. Reports she was transferring from the bathtub to the w/c. Denies hitting head, no LOC, takes Plavix. C/o left hip pain, non tender with palpation.

## 2016-06-26 ENCOUNTER — Ambulatory Visit: Payer: Medicare Other | Admitting: Vascular Surgery

## 2016-06-30 ENCOUNTER — Other Ambulatory Visit (HOSPITAL_COMMUNITY): Payer: Medicare Other

## 2016-06-30 ENCOUNTER — Encounter (HOSPITAL_COMMUNITY): Payer: Medicare Other

## 2016-07-01 ENCOUNTER — Telehealth: Payer: Self-pay | Admitting: Neurology

## 2016-07-01 NOTE — Telephone Encounter (Signed)
Pt's daughter, Shanda BumpsJessica,  called requesting appt time be moved to later in the day. Pt has appt with pcp at the same time about FL2 and does not want to miss that appt either. Is there a way to work pt in at the end of the day? Please call Shanda BumpsJessica to advise thank you

## 2016-07-01 NOTE — Telephone Encounter (Signed)
I spoke to [place] and rescheduled the patient for a later day

## 2016-07-02 ENCOUNTER — Ambulatory Visit: Payer: Medicare Other | Admitting: Neurology

## 2016-07-02 ENCOUNTER — Encounter: Payer: Self-pay | Admitting: Neurology

## 2016-07-02 ENCOUNTER — Ambulatory Visit (INDEPENDENT_AMBULATORY_CARE_PROVIDER_SITE_OTHER): Payer: Medicare Other | Admitting: Neurology

## 2016-07-02 VITALS — BP 134/78 | HR 82 | Resp 16

## 2016-07-02 DIAGNOSIS — Z720 Tobacco use: Secondary | ICD-10-CM

## 2016-07-02 DIAGNOSIS — Z89511 Acquired absence of right leg below knee: Secondary | ICD-10-CM | POA: Diagnosis not present

## 2016-07-02 DIAGNOSIS — I63019 Cerebral infarction due to thrombosis of unspecified vertebral artery: Secondary | ICD-10-CM

## 2016-07-02 DIAGNOSIS — R609 Edema, unspecified: Secondary | ICD-10-CM | POA: Diagnosis not present

## 2016-07-02 DIAGNOSIS — G47411 Narcolepsy with cataplexy: Secondary | ICD-10-CM

## 2016-07-02 DIAGNOSIS — R600 Localized edema: Secondary | ICD-10-CM

## 2016-07-02 DIAGNOSIS — F17200 Nicotine dependence, unspecified, uncomplicated: Secondary | ICD-10-CM

## 2016-07-02 MED ORDER — MODAFINIL 100 MG PO TABS: 100.0000 mg | ORAL_TABLET | Freq: Every day | ORAL | Status: DC

## 2016-07-02 NOTE — Progress Notes (Signed)
Subjective:    Patient ID: Laurie [name] is a 70 y.o. female.  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 She is accompanied by her daughter today. I first met her on 02/02/14, at which time she reported a diagnosis of narcolepsy in 1997. She was previously tried on Adderall but had not been on any medication for this in the past several years. Adderall worked well, but she could not afford it any longer after her insurance changed. She had tried Ritalin, but it caused SEs. She has a Hx of mild cataplexy with laughing, but this was reported and infrequent and rather mild. She reported sleeping with disruption at night and dreaming vividly and probable hypnopompic hallucinations, with vivid images in her mind and tactile effects on her. 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 Symptoms date back to '93 and she was diagnosed in '97. She used to see Dr. Annamaria Boots. She had also seen Dr. Fayrene Helper in our office years ago.  I asked her to come back for sleep study testing and she had a NPSG and MSLT on 03/16/2014 as well as 03/17/2014, respectively and I went over her test results with her in detail today. Her baseline sleep study showed a sleep efficiency of 88.4% with a latency to sleep of 12 minutes and wake after sleep onset of 40.5 minutes with mild sleep fragmentation noted. She had arousal index which was normal at 2.8 per hour. She had a normal percentage of stage I and stage II sleep, an increased percentage of deep sleep, and a decreased percentage of REM sleep at 9.3%. She had a  prolonged REM latency. She had frequent PVCs and runs of trigeminy and quadrigeminy on the EKG. She had mild to moderate snoring. Her total AHI was 0.9 per hour. Baseline oxygen saturation was 92% with a nadir of 87%. She had an MSLT which showed a mean sleep latency of 2.8 minutes, with 3 REM onset naps reported. She had sleep during all 5 naps and REM sleep and nap #1, 2 and 5. I felt that even though she had a decreased REM percentage and increased REM latency during the baseline test, her clinical history, very short mean sleep latency during the MSLT and the fact that she achieved REM onset sleep in the last nap would still argue in favor of the diagnosis of narcolepsy.  Today, 07/02/2016: I have not seen her since February 2015 when I first met her. She is accompanied by her daughter today. She reports sleepiness, has apparently tried generic modafinil and Adderall with mixture unknown results. Daughter feels that the Adderall was not very helpful. She has been off of it for the past month or so as she ran out of it. She takes narcotic pain medication as needed. Daughter lives with her, but works fulltime. Unfortunately, patient had to have below knee amputation in 2016. Has swelling and redness in the left foot. She is practically WC bound. Daughter also reports the patient does not take care of herself. She does not drink enough water. She does not eat well. In fact, when I  walked in she was eating french fries from [employer]. She still smokes.  Previously:   02/02/14: Since her stroke, she has had improvement in her weakness and uses no cane. She has not fallen.  She goes to bed around 11 PM. She wakes around 8 AM. She takes no scheduled naps, but does fall asleep during the day. She has driven one time since the stroke.  She consumes multiple caffeinated beverages per day, usually in the form of 2 cups of coffee in the AM, and sweet tea all day long.  She quit smoking after the stroke.   Her  Past Medical History Is Significant For: Past Medical History  Diagnosis Date  . Hypertension   . Chest pain     Troponins up to 0.12 while hospitalized for E.coli PNA  . 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 Bacteremia   . Anemia   . Narcolepsy   . Family history of adverse reaction to anesthesia     "brother died 06-26-13; woke up w/headache, vomited blood; drilled holes in his head; bleeding stroke; never came back" (03/05/2015)  . High cholesterol   . Type II diabetes mellitus ([place])   . Walking pneumonia 04/2009    "double" Archie Endo 04/23/2011  . Stroke [facility]) 10/2014    "weaker on left side since" (03/05/2015)  . Headache     "when someone gets on my nerves" (03/05/2015)  . Bursitis     "left hip; sometimes left shoulder" (03/05/2015)  . Anxiety   . Depression     Her Past Surgical History Is Significant For: Past Surgical History  Procedure Laterality Date  . Amputation Right 11/03/2014    Procedure: AMPUTATION BELOW KNEE;  Surgeon: Elam Dutch, MD;  Location: [place];  Service: Vascular;  Laterality: Right;  . Lower extremity angiogram N/A 10/03/2014    Procedure: LOWER EXTREMITY ANGIOGRAM;  Surgeon: Laverda Page, MD;  Location: [facility] CATH LAB;  Service: Cardiovascular;  Laterality: N/A;  . Abdominal aortagram N/A 03/05/2015    Procedure: ABDOMINAL Maxcine Ham;  Surgeon: Angelia Mould, MD;  Location: [facility] CATH LAB;  Service: Cardiovascular;  Laterality: N/A;  . Femoral-tibial bypass graft Left 03/06/2015    Procedure: BYPASS GRAFT Left FEMORAL-POSTERIOR TIBIAL ARTERY with reversed saphenous vein;  Surgeon: Elam Dutch, MD;  Location: [place];  Service: Vascular;  Laterality: Left;  . Intraoperative arteriogram Left 03/06/2015    Procedure: INTRA OPERATIVE ARTERIOGRAM;  Surgeon: Elam Dutch, MD;  Location: [facility] OR;  Service: Vascular;  Laterality: Left;    Her Family History Is Significant For: Family History  Problem Relation Age of Onset  .  CAD Mother   . CAD Father     Her Social History Is Significant For: Social History   Social History  . Marital Status: Divorced    Spouse Name: N/A  . Number of Children: N/A  . Years of Education: N/A   Social History Main Topics  . Smoking status: Current Every Day Smoker -- 0.10 packs/day for 40 years    Types: Cigarettes  . Smokeless tobacco: Never Used  . Alcohol Use: 0.0 oz/week    0 Standard drinks or equivalent per week     Comment: 03/05/2015 "Might have a drink on holidays"  . Drug Use: No  . Sexual Activity: Not Currently   Other Topics Concern  . None   Social History Narrative   She lives with her sister and they do  not have a good relationship.    Her Allergies Are:  Allergies  Allergen Reactions  . Penicillins Hives    Childhood allergy could have been more reaction only remembers hives.   :   Her Current Medications Are:  Outpatient Encounter Prescriptions as of 07/02/2016  Medication Sig  . ALPRAZolam (XANAX) 0.25 MG tablet Take one tablet by mouth twice daily as needed for anxiety (Patient taking differently: Take 0.25 mg by mouth 2 (two) times daily. Take one tablet by mouth twice daily as needed for anxiety)  . aspirin EC 81 MG tablet Take 81 mg by mouth daily.  Marland Kitchen atorvastatin (LIPITOR) 10 MG tablet Take 1 tablet (10 mg total) by mouth daily at 6 PM.  . cephALEXin (KEFLEX) 500 MG capsule Take 1 capsule (500 mg total) by mouth 3 (three) times daily.  . Cholecalciferol (VITAMIN D3 PO) Take 1 tablet by mouth daily.  . cilostazol (PLETAL) 100 MG tablet Take 1 tablet (100 mg total) by mouth 2 (two) times daily.  . citalopram (CELEXA) 10 MG tablet Take 10 mg by mouth daily.  . clopidogrel (PLAVIX) 75 MG tablet Take 75 mg by mouth daily.  . Coenzyme Q10 (COQ10 PO) Take 1 capsule by mouth daily.  . diclofenac sodium (VOLTAREN) 1 % GEL Apply 4 g topically 4 (four) times daily.  . DULoxetine (CYMBALTA) 60 MG capsule Take 60 mg by mouth daily.  Marland Kitchen gabapentin  (NEURONTIN) 300 MG capsule Take 1 capsule (300 mg total) by mouth 2 (two) times daily. (Patient taking differently: Take 300 mg by mouth 3 (three) times daily. )  . hydrochlorothiazide (MICROZIDE) 12.5 MG capsule Take 1 capsule (12.5 mg total) by mouth daily.  Marland Kitchen HYDROcodone-acetaminophen (NORCO/VICODIN) 5-325 MG per tablet Take one tablet by mouth every 6 hours as needed for pain  . insulin glargine (LANTUS) 100 UNIT/ML injection Inject 0.2 mLs (20 Units total) into the skin at bedtime.  Marland Kitchen lisinopril (PRINIVIL,ZESTRIL) 20 MG tablet Take 1 tablet (20 mg total) by mouth daily.  Marland Kitchen oxyCODONE-acetaminophen (PERCOCET/ROXICET) 5-325 MG per tablet Take 1-2 tablets by mouth every 6 (six) hours as needed for severe pain.  Marland Kitchen SANTYL ointment Apply 1 application topically daily.   . silver sulfADIAZINE (SILVADENE) 1 % cream Apply 1 application topically daily.  Marland Kitchen sulfamethoxazole-trimethoprim (SEPTRA DS) 800-160 MG per tablet Take 1 tablet by mouth 2 (two) times daily.  Marland Kitchen amphetamine-dextroamphetamine (ADDERALL) 10 MG tablet Take 10 mg by mouth 2 (two) times daily with a meal. Reported on 07/02/2016  . [DISCONTINUED] modafinil (PROVIGIL) 200 MG tablet Take one tablet by mouth twice daily for narcolepsy   No facility-administered encounter medications on file as of 07/02/2016.  :  Review of Systems:  Out of a complete 14 point review of systems, all are reviewed and negative with the exception of these symptoms as listed below:   Review of Systems  Neurological:       Patient was taken off of Modafinil and put on Adderall by PCP. Daughter reports that the Adderall did not help her during the day. Patient has been off of Adderall for 30 days, due to needing appt before further refills.  Memory loss is a new concern.     Objective:  Neurologic Exam  Physical Exam   Physical Examination:   Filed Vitals:   07/02/16 1339  BP: 134/78  Pulse: 82  Resp: 16    General Examination: The patient is a very  pleasant 70 y.o. female in no acute distress.  She  more frail and deconditioned. She has lower extremity swelling on the left and is status post below-the-knee amputation on the right.   HEENT: Normocephalic, atraumatic, pupils are equal, round and reactive to light and accommodation. Extraocular tracking is good without limitation to gaze excursion or nystagmus noted. Normal smooth pursuit is noted. Hearing is grossly intact. Face is slightly asymmetric with L slight NLF depression and normal facial sensation. Speech is fairly clear with minimal dysarthria noted. There is no hypophonia. There is no lip, neck/head, jaw or voice tremor. Neck is supple with full range of passive and active motion. There are no carotid bruits on auscultation. Oropharynx exam reveals: moderate mouth dryness, dentures in the upper, no teeth in the lower jaw, and moderate airway crowding, due to larger tongue and redundant airway. Mallampati is class II. Tongue protrudes centrally and palate elevates symmetrically. Tonsils are 1+.   Chest: Clear to auscultation without wheezing, rhonchi or crackles noted.  Heart: S1+S2+0, regular and normal without murmurs, rubs or gallops noted.   Abdomen: Soft, non-tender and non-distended with normal bowel sounds appreciated on auscultation.  Extremities: There is pitting edema in the L distal lower extremity. Pedal pulses are intact.  Skin: Warm and dry without trophic changes noted. There are no varicose veins.  Musculoskeletal: exam reveals  new status post below-knee amputation on the right and significant swelling of the left foot and distal leg with redness and thickening of the skin noted.     Neurologically:  Mental status: The patient is awake, alert and oriented in all 4 spheres. Her immediate and remote memory, attention, language skills and fund of knowledge are appropriate. There is no evidence of aphasia, agnosia, apraxia or anomia. Speech is clear with normal prosody and  enunciation. Thought process is linear. Mood is normal and affect is normal.  Cranial nerves II - XII are as described above under HEENT exam. In addition: shoulder shrug is normal with equal shoulder height noted. Motor exam: Normal bulk, and tone is noted,  she has mild left-sided weakness. Reflexes are 1-2+ bilaterally. Gait, posture and balance are not testable. She is wheelchair-bound. Sensory exam: intact to light touch in the upper and lower extremities.               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 I have not seen her since February 2015. We did sleep study testing in March 2015 and results were supportive of narcolepsy. We talked about her test results in detail today. I will reinitiate symptomatic treatment of her daytime somnolence with generic Provigil 100 mg twice daily, second dose no later than 3 PM. She is strongly advised about stroke risk factors and risk factor reduction, secondary prevention. Furthermore, she is strongly advised to quit smoking as it is not only harmful to her health but also has because she has excessive somnolence it may fall asleep with his cigarette in her hand and set the house on fire. She is advised to be better hydrated with water and follow-up in 3 months. She can see one of our nurse practitioners at the time and I will see  her back after that. She is advised to improve her eating habits and avoid starchy foods, salty food, and processed meat. I provided a written prescription for modafinil 100 mg strength twice daily, and written  instructions. I answered all their questions today and the patient and her daughter were in agreement. I spent 30 minutes in total face-to-face time with the patient, more than 50% of which was spent in counseling and coordination of care, reviewing test results, reviewing medication and discussing or reviewing the diagnosis of narcolepsy, stroke, smoking, the prognosis and treatment options.

## 2016-07-02 NOTE — Patient Instructions (Addendum)
We will restart Provigil generic (aka modafinil) 100 mg: 1 pill up to 2 times a day. Avoid after 3 PM. Side effects include (but are not limited to): high blood pressure, headache, nervousness, palpitations, GI upset, tremor.   Follow up in 6 months, we will have you see one of our NPs.   You have to STOP SMOKING!!!  You may fall asleep with a cigarette in your hand and set the house on fire!

## 2016-07-04 ENCOUNTER — Telehealth: Payer: Self-pay | Admitting: Neurology

## 2016-07-04 NOTE — Telephone Encounter (Signed)
Daughter Shanda BumpsJessica called regarding modafinil (PROVIGIL) 100 MG tablet, [facility] [address] that this medication needs Prior Authorization.

## 2016-07-04 NOTE — Telephone Encounter (Signed)
Please initiate PA for Provigil 100 mg bid for narcolepsy with cataplexy. thx

## 2016-07-07 NOTE — Telephone Encounter (Signed)
Per CMM, request was approved. Waiting for approval fax from [employer] to fax to pharmacy.

## 2016-07-07 NOTE — Telephone Encounter (Signed)
PA started 07/07/16 on CoverMyMeds. Key: [id number]

## 2016-07-07 NOTE — Telephone Encounter (Signed)
[facility], I still have not received fax from insurance.

## 2016-07-09 NOTE — Telephone Encounter (Signed)
Received faxed approval from [employer]. Member ID: [id number]. Authorization good until 07/08/2018.

## 2016-07-14 ENCOUNTER — Encounter (HOSPITAL_COMMUNITY): Payer: Medicare Other

## 2016-07-14 ENCOUNTER — Other Ambulatory Visit (HOSPITAL_COMMUNITY): Payer: Medicare Other

## 2016-07-16 ENCOUNTER — Ambulatory Visit: Payer: Self-pay | Admitting: Neurology

## 2016-07-17 ENCOUNTER — Ambulatory Visit: Payer: Medicare Other | Admitting: Vascular Surgery

## 2016-08-14 ENCOUNTER — Other Ambulatory Visit (HOSPITAL_COMMUNITY): Payer: Medicare Other

## 2016-08-14 ENCOUNTER — Encounter (HOSPITAL_COMMUNITY): Payer: Medicare Other

## 2016-08-14 ENCOUNTER — Ambulatory Visit: Payer: Medicare Other | Admitting: Vascular Surgery

## 2016-11-21 ENCOUNTER — Encounter: Payer: Medicare Other | Attending: Nurse Practitioner | Admitting: Nurse Practitioner

## 2016-11-21 DIAGNOSIS — F329 Major depressive disorder, single episode, unspecified: Secondary | ICD-10-CM | POA: Diagnosis not present

## 2016-11-21 DIAGNOSIS — Z89511 Acquired absence of right leg below knee: Secondary | ICD-10-CM | POA: Diagnosis not present

## 2016-11-21 DIAGNOSIS — I10 Essential (primary) hypertension: Secondary | ICD-10-CM | POA: Diagnosis not present

## 2016-11-21 DIAGNOSIS — G47411 Narcolepsy with cataplexy: Secondary | ICD-10-CM | POA: Insufficient documentation

## 2016-11-21 DIAGNOSIS — E1152 Type 2 diabetes mellitus with diabetic peripheral angiopathy with gangrene: Secondary | ICD-10-CM | POA: Diagnosis not present

## 2016-11-21 DIAGNOSIS — Z8673 Personal history of transient ischemic attack (TIA), and cerebral infarction without residual deficits: Secondary | ICD-10-CM | POA: Insufficient documentation

## 2016-11-21 DIAGNOSIS — L89620 Pressure ulcer of left heel, unstageable: Secondary | ICD-10-CM | POA: Insufficient documentation

## 2016-11-21 DIAGNOSIS — F17210 Nicotine dependence, cigarettes, uncomplicated: Secondary | ICD-10-CM | POA: Diagnosis not present

## 2016-11-21 DIAGNOSIS — Z880 Allergy status to penicillin: Secondary | ICD-10-CM | POA: Diagnosis not present

## 2016-11-21 DIAGNOSIS — F419 Anxiety disorder, unspecified: Secondary | ICD-10-CM | POA: Diagnosis not present

## 2016-11-21 DIAGNOSIS — I739 Peripheral vascular disease, unspecified: Secondary | ICD-10-CM | POA: Diagnosis not present

## 2016-11-21 DIAGNOSIS — E785 Hyperlipidemia, unspecified: Secondary | ICD-10-CM | POA: Insufficient documentation

## 2016-11-21 DIAGNOSIS — Z86718 Personal history of other venous thrombosis and embolism: Secondary | ICD-10-CM | POA: Diagnosis not present

## 2016-11-21 DIAGNOSIS — F0390 Unspecified dementia without behavioral disturbance: Secondary | ICD-10-CM | POA: Diagnosis not present

## 2016-11-23 NOTE — Progress Notes (Signed)
ABISAI, COBLE ([id number]) Visit Report for 11/21/2016 Chief Complaint Document Details Patient Name: Laurie [name], Laurie [name] 11/21/2016 12:30 Date of Service: PM Medical Record [id number] Number: Patient Account Number: [id number] [birth year] (70 y.o. Treating RN: Huel Coventry Date of Birth/Sex: Female) Other Clinician: Primary Care Physician: Earlie Lou Treating Sherry Blackard Referring Physician: Earlie Lou Physician/Extender: Tania Ade in Treatment: 0 Information Obtained from: Patient Chief Complaint Ms. Donzetta Kohut clinic today for evaluation of left heel ulcer Electronic Signature(s) Signed: 11/21/2016 2:35:19 PM By: Penne Lash, NP, Dhyan Noah Entered By: Penne Lash, NP, Osamu Olguin on 11/21/2016 14:35:19 [name] ([id number]) -------------------------------------------------------------------------------- HPI Details Patient Name: SHAUNTELLE, JAMERSON 11/21/2016 12:30 Date of Service: PM Medical Record [id number] Number: Patient Account Number: [id number] 1946/01/23 (70 y.o. Treating RN: Huel Coventry Date of Birth/Sex: Female) Other Clinician: Primary Care Physician: Earlie Lou Treating Ysabelle Goodroe Referring Physician: Earlie Lou Physician/Extender: Tania Ade in Treatment: 0 History of Present Illness Location: left heel Quality: denies pain Severity: denies pain Duration: patient unclear regarding exact timeframe but states since admission to [facility] (admission date 07/27/2016) Timing: denies pain Context: pressure Modifying Factors: presents today with Multi-Podus boot HPI Description: 11/21/2016 Ms. Roberson arrives to the clinic today for evaluation of a left heel pressure ulcer. She is unclear as to when ulcer actually occurred but states it was since arrival to [facility] in August. No notes were sent from [facility] indicating onset of ulcer. She does arrive wearing a Multi-Podus boot. She is unaware of her recent blood sugar levels,  there are no records sent nor is there a recent A1c and Epic. Electronic Signature(s) Signed: 11/21/2016 2:46:19 PM By: Penne Lash, NP, Reggie Pile By: Penne Lash, NP, Adriel Desrosier on 11/21/2016 14:46:19 [name] ([id number]) -------------------------------------------------------------------------------- Physical Exam Details Patient Name: MONIE, SHERE 11/21/2016 12:30 Date of Service: PM Medical Record [id number] Number: Patient Account Number: [id number] 07-27-1946 (70 y.o. Treating RN: Huel Coventry Date of Birth/Sex: Female) Other Clinician: Primary Care Physician: Earlie Lou Treating Brenson Hartman Referring Physician: Earlie Lou Physician/Extender: Tania Ade in Treatment: 0 Constitutional BP within normal limits. well nourished; well developed; appears stated age;Marland Kitchen Respiratory non-labored respiratory effort. Cardiovascular LLE - faint audible doppler signal to PT and peroneal, unable to obtain Doppler signals to DP, unable to palpate or obtain Doppler signals to popliteal, palpable femoral. left lower extremity edema. Musculoskeletal arrives in wheelchair, able to transfer with assistance to chair, Multi-Podus boot and use to left foot. Integumentary (Hair, Skin) left heel is covered in dry eschar with the exception of the periphery which has exposed subcutaneous tissue, no periwound erythema, no signs of acute infection. no induration, no fluctuance. Psychiatric does not appear to fully comprehend disease process. oriented to time, place, person and situation. calm, pleasant, conversive. Electronic Signature(s) Signed: 11/21/2016 5:51:50 PM By: Penne Lash, NP, Reggie Pile By: Penne Lash, NP, Ismerai Bin on 11/21/2016 17:51:50 [name] ([id number]) -------------------------------------------------------------------------------- Physician Orders Details Patient Name: DEWAYNE, JUREK 11/21/2016 12:30 Date of Service: PM Medical Record [id number] Number: Patient Account  Number: [id number] Jun 24, 1946 (70 y.o. Treating RN: Huel Coventry Date of Birth/Sex: Female) Other Clinician: Primary Care Physician: Earlie Lou Treating Leman Martinek Referring Physician: Earlie Lou Physician/Extender: Tania Ade in Treatment: 0 Verbal / Phone Orders: Yes Clinician: Huel Coventry Read Back and Verified: Yes Diagnosis Coding Wound Cleansing Wound #1 Medial Calcaneus o Cleanse wound with mild soap and water Primary Wound Dressing Wound #1 Medial Calcaneus o Other: - Betadine Secondary Dressing Wound #1 Medial Calcaneus o Gauze and Kerlix/Conform - heel cup Dressing Change Frequency Wound #1  Medial Calcaneus o Change dressing every day. Follow-up Appointments o Return Appointment in 1 week. Consults o Vascular - Consult for surgical debridement / assess blood flow. Electronic Signature(s) Signed: 11/21/2016 4:57:06 PM By: Elliot Gurney RN, BSN, Kim RN, BSN Signed: 11/21/2016 8:21:50 PM By: Penne Lash, NP, Tanee Henery Entered By: Elliot Gurney RN, BSN, Kim on 11/21/2016 14:01:48 [name] ([id number]) -------------------------------------------------------------------------------- Problem List Details Patient Name: KATALINA, MAGRI 11/21/2016 12:30 Date of Service: PM Medical Record [id number] Number: Patient Account Number: [id number] 12-27-1945 (70 y.o. 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 11/21/2016 12:30 Date of Service: PM Medical Record [id number] Number: Patient Account Number: [id number] [birth year] (70 y.o. Treating RN: Huel Coventry Date of Birth/Sex: Female) Other Clinician: Primary Care Physician: Earlie Lou Treating Laraina Sulton Referring Physician: Earlie Lou Physician/Extender: Tania Ade in Treatment: 0 Subjective Chief Complaint Information obtained from Patient Ms. Donzetta Kohut clinic today for evaluation of left heel ulcer History of Present Illness (HPI) The following HPI elements were documented for the patient's wound: Location: left heel Quality: denies pain Severity: denies pain Duration: patient unclear regarding exact timeframe but states since admission to [facility] (admission date 07/27/2016) Timing: denies pain Context: pressure Modifying Factors: presents today with Multi-Podus boot 11/21/2016 Ms. Monsour arrives to the clinic today for evaluation of a left heel pressure ulcer. She is unclear as to when ulcer actually occurred but states it was since arrival to [facility] in August. No notes were sent from [facility] healthcare indicating onset of ulcer. She does arrive wearing a Multi-Podus boot. She is unaware of her recent blood sugar levels, there are no records sent nor is there a recent A1c and Epic. Wound History Patient presents with 1 open wound that has been present for approximately 2 months. Patient has been treating wound in the following manner: ca alg. Laboratory tests have not been performed in the last month. Patient reportedly has not tested positive for an antibiotic resistant organism. Patient reportedly has not tested  positive for osteomyelitis. Patient reportedly has had testing performed to evaluate circulation in the legs. Patient History Information obtained from Chart. Allergies penicillin AJWA, KIMBERLEY ([id number]) Family History Cancer - Father, Diabetes - Father, Mother, Siblings, No family history of Heart Disease. Social History Former smoker - 2 weeks ago, Marital Status - Single, Alcohol Use - Rarely, Drug Use - No History, Caffeine Use - Daily. Medical History Endocrine Patient has history of Type II Diabetes Genitourinary Denies history of End Stage Renal Disease Immunological Denies history of Lupus Erythematosus, Raynaud s, Scleroderma Integumentary (Skin) Denies history of History of Burn, History of pressure wounds Musculoskeletal Denies history of Gout, Rheumatoid Arthritis, Osteoarthritis, Osteomyelitis Neurologic Patient has history of Dementia, Neuropathy Denies history of Quadriplegia, Paraplegia, Seizure Disorder Oncologic Denies history of Received Chemotherapy, Received Radiation Psychiatric Denies history of Anorexia/bulimia, Confinement Anxiety Patient is treated with Insulin. Medical And Surgical History Notes Constitutional Symptoms (General Health) Right Leg amputation;  Type II DM; Narcolepsy; HTN; stroke; Hyperlipidema, Depression Review of Systems (ROS) Constitutional Symptoms (General Health) The patient has no complaints or symptoms. Eyes The patient has no complaints or symptoms. Ear/Nose/Mouth/Throat The patient has no complaints or symptoms. Hematologic/Lymphatic The patient has no complaints or symptoms. Respiratory The patient has no complaints or symptoms. Cardiovascular Complains or has symptoms of LE edema. Gastrointestinal The patient has no complaints or symptoms. Endocrine The patient has no complaints or symptoms. Genitourinary Druscilla BrowniePINKNEY, Annalei W. ([id number]) Complains or has symptoms of Incontinence/dribbling. Immunological The  patient has no complaints or symptoms. Integumentary (Skin) Complains or has symptoms of Wounds, Bleeding or bruising tendency. Denies complaints or symptoms of Breakdown, Swelling. Musculoskeletal The patient has no complaints or symptoms. Neurologic The patient has no complaints or symptoms. Oncologic The patient has no complaints or symptoms. Psychiatric The patient has no complaints or symptoms. General Notes: Patient is not good historian. Acquired most information from her chart. 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 Darrick [facility]. Objective Constitutional BP within normal limits. well nourished; well developed; appears stated age;Marland Kitchen. Vitals Time Taken: 12:51 PM, Height: 67 in, Weight: 185 lbs, BMI: 29, Pulse: 88 bpm, Respiratory Rate: 16 breaths/min, Blood Pressure: 128/78 mmHg. Respiratory non-labored respiratory effort. Cardiovascular LLE - faint audible doppler signal to PT and peroneal, unable to obtain Doppler signals to DP, unable to palpate or obtain Doppler signals to popliteal, palpable femoral. left lower extremity edema. Musculoskeletal arrives in wheelchair, able to transfer with assistance to chair, Multi-Podus boot and use to left foot. [name], Kaydra W. ([id number]) Psychiatric does not appear to fully comprehend disease process. oriented to time, place, person and situation. calm, pleasant, conversive. Integumentary (Hair, Skin) left heel is covered in dry eschar with the exception of the periphery which has exposed subcutaneous tissue, no periwound erythema,  no signs of acute infection. no induration, no fluctuance. Wound #1 status is Open. Original cause of wound was Pressure Injury. The wound is located on the Medial Calcaneus. The wound measures 7.5cm length x 11cm width x 0.1cm depth; 64.795cm^2 area and 6.48cm^3 volume. There is no tunneling or undermining noted. There is a small amount of serous drainage noted. The wound margin is flat and intact. There is no granulation within the wound bed. There is a large (67-100%) amount of necrotic tissue within the wound bed including Eschar. The periwound skin appearance exhibited: Dry/Scaly. Periwound temperature was noted as No Abnormality. The periwound has tenderness on palpation. 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 [name], Ivee W. ([id number]) Follow-up Appointments: Return Appointment in 1 week. Consults ordered were: Vascular - Consult for surgical debridement / assess blood flow. Follow-Up Appointments: A follow-up appointment should be scheduled. Medication Reconciliation completed and  provided to Patient/Care Provider. A Patient Clinical Summary of Care was provided to AP 1. Pain with Betadine daily 2. Refer to her vascular surgeon for possible debridement and evaluation of bypass site 3. Will follow-up next week 4. Continue with Multi-Podus boot Electronic Signature(s) Signed: 11/21/2016 5:55:47 PM By: Penne Lash, NP, Laranda Burkemper Previous Signature:  11/21/2016 5:52:48 PM Version By: Penne Lash, NP, Gerianne Simonet Entered By: Penne Lash, NP, Celica Kotowski on 11/21/2016 17:55:47 [name] ([id number]) -------------------------------------------------------------------------------- ROS/PFSH Details Patient Name: COLIE, JOSTEN 11/21/2016 12:30 Date of Service: PM Medical Record [id number] Number: Patient Account Number: [id number] September 22, 1946 (70 y.o. Treating RN: Huel Coventry Date of Birth/Sex: Female) Other Clinician: Primary Care Physician: Earlie Lou Treating Clete Kuch Referring Physician: Earlie Lou Physician/Extender: Tania Ade in Treatment: 0 Information Obtained From Chart Wound History Do you currently have one or more open woundso Yes How many open wounds do you currently haveo 1 Approximately how long have you had your woundso 2 months How have you been treating your wound(s) until nowo ca alg Has your wound(s) ever healed and then re-openedo No Have you had any lab work done in the past montho No Have you tested positive for an antibiotic resistant organism (MRSA, VRE)o No Have you tested positive for osteomyelitis (bone infection)o No Have you had any tests for circulation on your legso Yes Cardiovascular Complaints and Symptoms: Positive for: LE edema Genitourinary Complaints and Symptoms: Positive for: Incontinence/dribbling Medical History: Negative for: End Stage Renal Disease Integumentary (Skin) Complaints and Symptoms: Positive for: Wounds; Bleeding or bruising tendency Negative for: Breakdown; Swelling Medical History: Negative for: History of Burn; History of pressure wounds Constitutional Symptoms (General Health) Complaints and Symptoms: No Complaints or Symptoms KAMBREA, CARRASCO ([id number]) Medical History: Past Medical History Notes: Right Leg amputation; Type II DM; Narcolepsy; HTN; stroke; Hyperlipidema, Depression Eyes Complaints and Symptoms: No Complaints or  Symptoms Ear/Nose/Mouth/Throat Complaints and Symptoms: No Complaints or Symptoms Hematologic/Lymphatic Complaints and Symptoms: No Complaints or Symptoms Respiratory Complaints and Symptoms: No Complaints or Symptoms Gastrointestinal Complaints and Symptoms: No Complaints or Symptoms Endocrine Complaints and Symptoms: No Complaints or Symptoms Medical History: Positive for: Type II Diabetes Treated with: Insulin Immunological Complaints and Symptoms: No Complaints or Symptoms Medical History: Negative for: Lupus Erythematosus; Raynaudos; Scleroderma Musculoskeletal Complaints and Symptoms: No Complaints or Symptoms Medical History: JANIE, CAPP ([id number]) Negative for: Gout; Rheumatoid Arthritis; Osteoarthritis; Osteomyelitis Neurologic Complaints and Symptoms: No Complaints or Symptoms Medical History: Positive for: Dementia; Neuropathy Negative for: Quadriplegia; Paraplegia; Seizure Disorder Oncologic Complaints and Symptoms: No Complaints or Symptoms Medical History: Negative for: Received Chemotherapy; Received Radiation Psychiatric Complaints and Symptoms: No Complaints or Symptoms Medical History: Negative for: Anorexia/bulimia; Confinement Anxiety Family and Social History Cancer: Yes - Father; Diabetes: Yes - Father, Mother, Siblings; Heart Disease: No; Former smoker - 2 weeks ago; Marital Status - Single; Alcohol Use: Rarely; Drug Use: No History; Caffeine Use: Daily Notes Patient is not good historian. Acquired most information from her chart. Electronic Signature(s) Signed: 11/21/2016 4:57:06 PM By: Elliot Gurney RN, BSN, Kim RN, BSN Signed: 11/21/2016 8:21:50 PM By: Penne Lash, NP, Ziasia Lenoir Entered By: Elliot Gurney RN, BSN, Kim on 11/21/2016 13:18:51 [name] ([id number]) -------------------------------------------------------------------------------- SuperBill Details Patient Name: [name] Date of Service: 11/21/2016 Medical Record Number:  [id number] Patient Account Number: [id number] Date of Birth/Sex: [birth year] (70 y.o. 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 Facility Procedures CPT4 Code: [id number] Description: 99214 - WOUND CARE VISIT-LEV 4 EST PT Modifier: Quantity: 1 Physician Procedures CPT4 Code Description: [id number] 99214 - WC PHYS LEVEL 4 - EST PT ICD-10 Description Diagnosis E11.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

## 2016-11-23 NOTE — Progress Notes (Signed)
Laurie [name], Laurie W. ([id number]) Visit Report for 11/21/2016 Abuse/Suicide Risk Screen Details Patient Name: Laurie [name], Laurie W. 11/21/2016 12:30 Date of Service: PM Medical Record [id number] Number: Patient Account Number: [id number] 1946/05/07 (70 y.o. Treating RN: Huel CoventryWoody, Kim Date of Birth/Sex: Female) Other Clinician: Primary Care Physician: Earlie LouGARBA, MOHAMMAD Treating Coulter, Leah Referring Physician: Earlie LouGARBA, MOHAMMAD Physician/Extender: Tania AdeWeeks in Treatment: 0 Abuse/Suicide Risk Screen Items Answer ABUSE/SUICIDE RISK SCREEN: Has anyone close to you tried to hurt or harm you recentlyo No Do you feel uncomfortable with anyone in your familyo No Has anyone forced you do things that you didnot want to doo No Do you have any thoughts of harming yourselfo No Patient displays signs or symptoms of abuse and/or neglect. No Electronic Signature(s) Signed: 11/21/2016 4:57:06 PM By: Elliot GurneyWoody, RN, BSN, Kim RN, BSN Entered By: Elliot GurneyWoody, RN, BSN, Kim on 11/21/2016 13:06:15 Laurie BrowniePINKNEY, Lenisha W. ([id number]) -------------------------------------------------------------------------------- Activities of Daily Living Details Patient Name: Laurie [name], Laurie W. 11/21/2016 12:30 Date of Service: PM Medical Record [id number] Number: Patient Account Number: [id number] 1946/05/07 (70 y.o. Treating RN: Huel CoventryWoody, Kim Date of Birth/Sex: Female) Other Clinician: Primary Care Physician: Earlie LouGARBA, MOHAMMAD Treating Coulter, Leah Referring Physician: Earlie LouGARBA, MOHAMMAD Physician/Extender: Tania AdeWeeks in Treatment: 0 Activities of Daily Living Items Answer Activities of Daily Living (Please select one for each item) Drive Automobile Not Able Take Medications Need Assistance Use Telephone Completely Able Care for Appearance Need Assistance Use Toilet Need Assistance Bath / Shower Need Assistance Dress Self Need Assistance Feed Self Need Assistance Walk Need Assistance Get In / Out Bed Need Assistance Housework Need Assistance Prepare  Meals Need Assistance Handle Money Need Assistance Shop for Self Need Assistance Electronic Signature(s) Signed: 11/21/2016 4:57:06 PM By: Elliot GurneyWoody, RN, BSN, Kim RN, BSN Entered By: Elliot GurneyWoody, RN, BSN, Kim on 11/21/2016 13:07:13 Laurie BrowniePINKNEY, Kiamesha W. ([id number]) -------------------------------------------------------------------------------- Education Assessment Details Patient Name: Laurie [name], Laurie W. 11/21/2016 12:30 Date of Service: PM Medical Record [id number] Number: Patient Account Number: [id number] 1946/05/07 (70 y.o. Treating RN: Huel CoventryWoody, Kim Date of Birth/Sex: Female) Other Clinician: Primary Care Physician: Earlie LouGARBA, MOHAMMAD Treating Coulter, Leah Referring Physician: Earlie LouGARBA, MOHAMMAD Physician/Extender: Tania AdeWeeks in Treatment: 0 Primary Learner Assessed: Patient Learning Preferences/Education Level/Primary Language Learning Preference: Explanation Highest Education Level: College or Above Preferred Language: English Cognitive Barrier Assessment/Beliefs Language Barrier: No Translator Needed: No Memory Deficit: No Emotional Barrier: No Cultural/Religious Beliefs Affecting Medical No Care: Physical Barrier Assessment Impaired Vision: Yes Glasses Impaired Hearing: No Decreased Hand dexterity: No Knowledge/Comprehension Assessment Knowledge Level: Low Comprehension Level: Low Ability to understand written Low instructions: Ability to understand verbal Low instructions: Motivation Assessment Anxiety Level: Anxious Cooperation: Cooperative Education Importance: Acknowledges Need Interest in Health Problems: Asks Questions Perception: Confused Willingness to Engage in Self- Medium Management Activities: Readiness to Engage in Self- Medium Management Activities: Laurie [name], Marien W. ([id number]) Electronic Signature(s) Signed: 11/21/2016 4:57:06 PM By: Elliot GurneyWoody, RN, BSN, Kim RN, BSN Entered By: Elliot GurneyWoody, RN, BSN, Kim on 11/21/2016 13:07:54 Laurie BrowniePINKNEY, [name]  ([id number]) -------------------------------------------------------------------------------- Fall Risk Assessment Details Patient Name: Laurie BrowniePINKNEY, Laurie W. 11/21/2016 12:30 Date of Service: PM Medical Record [id number] Number: Patient Account Number: [id number] 1946/05/07 (70 y.o. Treating RN: Huel CoventryWoody, Kim Date of Birth/Sex: Female) Other Clinician: Primary Care Physician: Earlie LouGARBA, MOHAMMAD Treating Coulter, Leah Referring Physician: Earlie LouGARBA, MOHAMMAD Physician/Extender: Tania AdeWeeks in Treatment: 0 Fall Risk Assessment Items Have you had 2 or more falls in the last 12 monthso 0 Yes Have you had any fall that resulted in injury in the last 12 monthso 0 Yes FALL RISK ASSESSMENT: History of falling - immediate or within  3 months 25 Yes Secondary diagnosis 0 No Ambulatory aid None/bed rest/wheelchair/nurse 0 Yes Crutches/cane/walker 0 No Furniture 0 No IV Access/Saline Lock 0 No Gait/Training Normal/bed rest/immobile 0 No Weak 0 No Impaired 20 Yes Mental Status Oriented to own ability 0 No Electronic Signature(s) Signed: 11/21/2016 4:57:06 PM By: Elliot GurneyWoody, RN, BSN, Kim RN, BSN Entered By: Elliot GurneyWoody, RN, BSN, Kim on 11/21/2016 13:08:21 Laurie BrowniePINKNEY, Astria W. ([id number]) -------------------------------------------------------------------------------- Foot Assessment Details Patient Name: Laurie [name], Laurie W. 11/21/2016 12:30 Date of Service: PM Medical Record [id number] Number: Patient Account Number: [id number] [birth year] (70 y.o. 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 ([id number]) Entered By: Elliot GurneyWoody, RN, BSN, Kim on 11/21/2016 13:10:52 Laurie BrowniePINKNEY, Ellary W. ([id number]) -------------------------------------------------------------------------------- Nutrition Risk Assessment Details Patient Name: Laurie [name], Doryce W. 11/21/2016 12:30 Date of Service: PM Medical Record [id number] Number: Patient Account Number: [id number] [birth year] (70 y.o. Treating RN: Huel CoventryWoody, Kim Date of Birth/Sex: Female) Other Clinician: Primary Care Physician: Earlie LouGARBA, MOHAMMAD Treating Coulter, Leah Referring Physician: Earlie LouGARBA, MOHAMMAD Physician/Extender: Tania AdeWeeks in Treatment: 0 Height (in): 67 Weight (lbs): 185 Body Mass Index (BMI): 29 Nutrition Risk Assessment Items NUTRITION RISK SCREEN: I have an illness or condition that made me change the kind and/or 0 No amount of food I eat I eat fewer than two meals per day 0 No I eat few fruits and vegetables, or milk products 0 No I have three or more drinks of beer, liquor or wine almost every day 0 No I have tooth or mouth problems that make it hard for me to eat 0 No I don't always have enough money to buy the food I need 0 No I eat alone most of the time 0 No I take three or more different prescribed or over-the-counter drugs a 1 Yes day Without wanting to, I have lost or gained 10 pounds in the last six 0 No months I am not always physically able to shop, cook and/or feed myself 0 No Nutrition Protocols Good Risk Protocol Provide education on elevated blood sugars Moderate Risk Protocol 0 and impact on wound healing, as applicable Electronic Signature(s) Signed: 11/21/2016 4:57:06 PM By: Elliot GurneyWoody, RN, BSN, Kim RN, BSN Entered By: Elliot GurneyWoody, RN,  BSN, Kim on 11/21/2016 13:08:43
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
## 2016-11-24 ENCOUNTER — Encounter: Payer: Self-pay | Admitting: Vascular Surgery

## 2016-11-26 ENCOUNTER — Other Ambulatory Visit: Payer: Self-pay

## 2016-11-26 DIAGNOSIS — I70244 Atherosclerosis of native arteries of left leg with ulceration of heel and midfoot: Secondary | ICD-10-CM

## 2016-11-26 DIAGNOSIS — Z95828 Presence of other vascular implants and grafts: Secondary | ICD-10-CM

## 2016-11-27 ENCOUNTER — Ambulatory Visit (INDEPENDENT_AMBULATORY_CARE_PROVIDER_SITE_OTHER)
Admission: RE | Admit: 2016-11-27 | Discharge: 2016-11-27 | Disposition: A | Discharge status: Home / Self Care | Payer: Medicare Other | Source: Ambulatory Visit | Attending: Vascular Surgery | Admitting: Vascular Surgery

## 2016-11-27 ENCOUNTER — Encounter: Payer: Self-pay | Admitting: Vascular Surgery

## 2016-11-27 ENCOUNTER — Ambulatory Visit (HOSPITAL_COMMUNITY)
Admission: RE | Admit: 2016-11-27 | Discharge: 2016-11-27 | Disposition: A | Discharge status: Home / Self Care | Payer: Medicare Other | Source: Ambulatory Visit | Attending: Vascular Surgery | Admitting: Vascular Surgery

## 2016-11-27 ENCOUNTER — Ambulatory Visit (INDEPENDENT_AMBULATORY_CARE_PROVIDER_SITE_OTHER): Payer: Medicare Other | Admitting: Vascular Surgery

## 2016-11-27 VITALS — BP 151/71 | HR 74 | Temp 98.6°F | Resp 16 | Ht 68.0 in | Wt 170.0 lb

## 2016-11-27 DIAGNOSIS — Z95828 Presence of other vascular implants and grafts: Secondary | ICD-10-CM

## 2016-11-27 DIAGNOSIS — Z48812 Encounter for surgical aftercare following surgery on the circulatory system: Secondary | ICD-10-CM | POA: Diagnosis not present

## 2016-11-27 DIAGNOSIS — I70244 Atherosclerosis of native arteries of left leg with ulceration of heel and midfoot: Secondary | ICD-10-CM | POA: Diagnosis not present

## 2016-11-27 DIAGNOSIS — I739 Peripheral vascular disease, unspecified: Secondary | ICD-10-CM | POA: Diagnosis not present

## 2016-11-27 DIAGNOSIS — Z89511 Acquired absence of right leg below knee: Secondary | ICD-10-CM | POA: Diagnosis not present

## 2016-11-27 NOTE — Progress Notes (Signed)
Vitals:   11/27/16 1408  BP: (!) 160/77  Pulse: 74  Resp: 16  Temp: 98.6 F (37 C)  SpO2: 98%  Weight: 170 lb (77.1 kg)  Height: 5\' 8"  (1.727 m)

## 2016-11-27 NOTE — Progress Notes (Signed)
      HPI:  This is a 70 y.o. female who is s/p Left femoral to posterior tibial bypass with reverse great saph vein 03/06/2015.  She returns today for follow-up . She has had a previous right BKA. She is nonambulatory. 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 She has never been fitted for prosthetic on the right leg. She is fairly unstable and has had several falls. She does not complain of rest pain or ulcers in the left foot. She has chronic edema in the left leg.  She now has developed a large left heel decubitus ulcer. She states she has some pain from this.   Physical Exam:  Vitals:   11/27/16 1408 11/27/16 1415  BP: (!) 160/77 (!) 151/71  Pulse: 74 74  Resp: 16   Temp: 98.6 F (37 C)   SpO2: 98%   Weight: 170 lb (77.1 kg)   Height: 5\' 8"  (1.727 m)      Extremities:  Left LE edema min to moderate seems to be dependent at this point, 7 x 7 left heel decubitus ulcer with black eschar   Data: Duplex ultrasound today showed a patent left femoral to posterior tibial artery bypass graft with monophasic waveforms ABI of 1.01. 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   Assessment/Plan:   Patient with large left heel decubitus with patent bypass graft. At this point, I believe the only options would be conservative measures with offloading the heel dry dressings hopefully this will heal with time. I did discuss with the patient and her daughter today that most likely will not heal and eventually she will come to a left above-knee amputation. Due to her current state of debility, she is not a candidate for further revascularization.   She will follow-up with us in 6 weeks' time or sooner if worsening pain or infection  Fabienne Brunsharles [name], MD [facility] [place] Office: [phone] Pager: [phone]

## 2016-11-28 ENCOUNTER — Encounter: Payer: Medicare Other | Admitting: Surgery

## 2016-11-28 DIAGNOSIS — L89620 Pressure ulcer of left heel, unstageable: Secondary | ICD-10-CM | POA: Diagnosis not present

## 2016-11-29 NOTE — Progress Notes (Addendum)
Laurie BrownieINKNEY, Lillianna W. ([id number]) Visit Report for 11/28/2016 Arrival Information Details Patient Name: Laurie BrownieINKNEY, Elton W. Date of Service: 11/28/2016 1:15 PM Medical Record Number: [id number] Patient Account Number: [id number] Date of Birth/Sex: 08-14-1946 (70 y.o. Female) Treating RN: Curtis Sitesorthy, Joanna Primary Care Physician: Earlie LouGARBA, MOHAMMAD Other Clinician: Referring Physician: Earlie LouGARBA, MOHAMMAD Treating Physician/Extender: Rudene ReBritto, Errol Weeks in Treatment: 1 Visit Information History Since Last Visit Added or deleted any medications: No Patient Arrived: Wheel Chair Any new allergies or adverse reactions: No Arrival Time: 13:21 Had a fall or experienced change in No Accompanied By: self activities of daily living that may affect Transfer Assistance: Manual risk of falls: Patient Identification Verified: Yes Signs or symptoms of abuse/neglect since last No Secondary Verification Process Yes visito Completed: Hospitalized since last visit: No Patient Requires Transmission- No Has Dressing in Place as Prescribed: Yes Based Precautions: Pain Present Now: Yes Patient Has Alerts: Yes Patient Alerts: Patient on Blood Thinner DM Type 2 Electronic Signature(s) Signed: 11/28/2016 3:38:57 PM By: Curtis Sitesorthy, Joanna Entered By: Curtis Sitesorthy, Joanna on 11/28/2016 13:25:27 Laurie BrowniePINKNEY, Allayah W. ([id number]) -------------------------------------------------------------------------------- Clinic Level of Care Assessment Details Patient Name: Laurie BrowniePINKNEY, Naava W. Date of Service: 11/28/2016 1:15 PM Medical Record Number: [id number] Patient Account Number: [id number] Date of Birth/Sex: 08-14-1946 (70 y.o. Female) Treating RN: Curtis Sitesorthy, Joanna Primary Care Physician: Earlie LouGARBA, MOHAMMAD Other Clinician: Referring Physician: Earlie LouGARBA, MOHAMMAD Treating Physician/Extender: Rudene ReBritto, Errol Weeks in Treatment: 1 Clinic Level of Care Assessment Items TOOL 4 Quantity Score []  - Use when only an EandM is performed on FOLLOW-UP visit  0 ASSESSMENTS - Nursing Assessment / Reassessment X - Reassessment of Co-morbidities (includes updates in patient status) 1 10 X - Reassessment of Adherence to Treatment Plan 1 5 ASSESSMENTS - Wound and Skin Assessment / Reassessment X - Simple Wound Assessment / Reassessment - one wound 1 5 []  - Complex Wound Assessment / Reassessment - multiple wounds 0 []  - Dermatologic / Skin Assessment (not related to wound area) 0 ASSESSMENTS - Focused Assessment []  - Circumferential Edema Measurements - multi extremities 0 []  - Nutritional Assessment / Counseling / Intervention 0 X - Lower Extremity Assessment (monofilament, tuning fork, pulses) 1 5 []  - Peripheral Arterial Disease Assessment (using hand held doppler) 0 ASSESSMENTS - Ostomy and/or Continence Assessment and Care []  - Incontinence Assessment and Management 0 []  - Ostomy Care Assessment and Management (repouching, etc.) 0 PROCESS - Coordination of Care X - Simple Patient / Family Education for ongoing care 1 15 []  - Complex (extensive) Patient / Family Education for ongoing care 0 []  - Staff obtains ChiropractorConsents, Records, Test Results / Process Orders 0 []  - Staff telephones HHA, Nursing Homes / Clarify orders / etc 0 []  - Routine Transfer to another Facility (non-emergent condition) 0 Laurie BrownieINKNEY, Arelia W. ([id number]) []  - Routine Hospital Admission (non-emergent condition) 0 []  - New Admissions / Manufacturing engineernsurance Authorizations / Ordering NPWT, Apligraf, etc. 0 []  - Emergency Hospital Admission (emergent condition) 0 X - Simple Discharge Coordination 1 10 []  - Complex (extensive) Discharge Coordination 0 PROCESS - Special Needs []  - Pediatric / Minor Patient Management 0 []  - Isolation Patient Management 0 []  - Hearing / Language / Visual special needs 0 []  - Assessment of Community assistance (transportation, D/C planning, etc.) 0 []  - Additional assistance / Altered mentation 0 []  - Support Surface(s) Assessment (bed, cushion, seat, etc.)  0 INTERVENTIONS - Wound Cleansing / Measurement X - Simple Wound Cleansing - one wound 1 5 []  - Complex Wound Cleansing - multiple wounds 0 X - Wound  Imaging (photographs - any number of wounds) 1 5 []  - Wound Tracing (instead of photographs) 0 X - Simple Wound Measurement - one wound 1 5 []  - Complex Wound Measurement - multiple wounds 0 INTERVENTIONS - Wound Dressings X - Small Wound Dressing one or multiple wounds 1 10 []  - Medium Wound Dressing one or multiple wounds 0 []  - Large Wound Dressing one or multiple wounds 0 []  - Application of Medications - topical 0 []  - Application of Medications - injection 0 INTERVENTIONS - Miscellaneous []  - External ear exam 0 DAYAN, KREIS ([id number]) []  - Specimen Collection (cultures, biopsies, blood, body fluids, etc.) 0 []  - Specimen(s) / Culture(s) sent or taken to Lab for analysis 0 []  - Patient Transfer (multiple staff / Michiel Sites Lift / Similar devices) 0 []  - Simple Staple / Suture removal (25 or less) 0 []  - Complex Staple / Suture removal (26 or more) 0 []  - Hypo / Hyperglycemic Management (close monitor of Blood Glucose) 0 []  - Ankle / Brachial Index (ABI) - do not check if billed separately 0 X - Vital Signs 1 5 Has the patient been seen at the hospital within the last three years: Yes Total Score: 80 Level Of Care: New/Established - Level 3 Electronic Signature(s) Signed: 11/28/2016 3:38:57 PM By: Curtis Sites Entered By: Curtis Sites on 11/28/2016 13:49:41 Laurie [name] ([id number]) -------------------------------------------------------------------------------- Encounter Discharge Information Details Patient Name: Laurie [name] Date of Service: 11/28/2016 1:15 PM Medical Record Number: [id number] Patient Account Number: [id number] Date of Birth/Sex: 1946-10-11 (70 y.o. Female) Treating RN: Curtis Sites Primary Care Physician: Earlie Lou Other Clinician: Referring Physician: Earlie Lou Treating  Physician/Extender: Rudene Re in Treatment: 1 Encounter Discharge Information Items Discharge Pain Level: 0 Discharge Condition: Stable Ambulatory Status: Wheelchair Discharge Destination: Nursing Home Transportation: Private Auto Accompanied By: self Schedule Follow-up Appointment: Yes Medication Reconciliation completed and provided to Patient/Care No Evadene Wardrip: Provided on Clinical Summary of Care: 11/28/2016 Form Type Recipient Paper Patient AP Electronic Signature(s) Signed: 11/28/2016 3:07:29 PM By: Curtis Sites Previous Signature: 11/28/2016 1:57:51 PM Version By: Gwenlyn Perking Entered By: Curtis Sites on 11/28/2016 15:07:29 Laurie [name] ([id number]) -------------------------------------------------------------------------------- Lower Extremity Assessment Details Patient Name: Laurie [name] Date of Service: 11/28/2016 1:15 PM Medical Record Number: [id number] Patient Account Number: [id number] Date of Birth/Sex: 15-May-1946 (70 y.o. Female) Treating RN: Curtis Sites Primary Care Physician: Earlie Lou Other Clinician: Referring Physician: Earlie Lou Treating Physician/Extender: Rudene Re in Treatment: 1 Edema Assessment Assessed: [Left: No] [Right: No] Edema: [Left: Ye] [Right: s] Calf Left: Right: Point of Measurement: 34 cm From Medial Instep 46 cm cm Ankle Left: Right: Point of Measurement: 13 cm From Medial Instep 30.7 cm cm Vascular Assessment Pulses: Posterior Tibial Palpable: [Left:No] Doppler: [Left:Inaudible] Dorsalis Pedis Palpable: [Left:No] Doppler: [Left:Inaudible] Extremity colors, hair growth, and conditions: Extremity Color: [Left:Hyperpigmented] Hair Growth on Extremity: [Left:No] Temperature of Extremity: [Left:Warm] Capillary Refill: [Left:< 3 seconds] Electronic Signature(s) Signed: 11/28/2016 3:38:57 PM By: Curtis Sites Entered By: Curtis Sites on 11/28/2016 13:35:22 Laurie [name]  ([id number]) -------------------------------------------------------------------------------- Multi Wound Chart Details Patient Name: Laurie [name] Date of Service: 11/28/2016 1:15 PM Medical Record Number: [id number] Patient Account Number: [id number] Date of Birth/Sex: [birth year] (70 y.o. Female) Treating RN: Curtis Sites Primary Care Physician: Earlie Lou Other Clinician: Referring Physician: Earlie Lou Treating Physician/Extender: Rudene Re in Treatment: 1 Vital Signs Height(in): 67 Pulse(bpm): 84 Weight(lbs): 185 Blood Pressure 172/71 (mmHg): Body Mass Index(BMI): 29 Temperature(F): Respiratory Rate 18 (breaths/min): Photos: [N/A:N/A]  Wound Location: Calcaneus - Medial N/A N/A Wounding Event: Pressure Injury N/A N/A Primary Etiology: Pressure Ulcer N/A N/A Comorbid History: Type II Diabetes, N/A N/A Dementia, Neuropathy Date Acquired: 09/22/2016 N/A N/A Weeks of Treatment: 1 N/A N/A Wound Status: Open N/A N/A Measurements L x W x D 7.5x11.3x0.1 N/A N/A (cm) Area (cm) : 21.308 N/A N/A Volume (cm) : 6.656 N/A N/A % Reduction in Area: -2.70% N/A N/A % Reduction in Volume: -2.70% N/A N/A Classification: Unstageable/Unclassified N/A N/A HBO Classification: Grade 1 N/A N/A Exudate Amount: Medium N/A N/A Exudate Type: Serous N/A N/A Exudate Color: amber N/A N/A Wound Margin: Flat and Intact N/A N/A Granulation Amount: None Present (0%) N/A N/A Necrotic Amount: Large (67-100%) N/A N/A Laurie [name] ([id number]) Necrotic Tissue: Eschar N/A N/A Epithelialization: None N/A N/A Periwound Skin Texture: No Abnormalities Noted N/A N/A Periwound Skin Dry/Scaly: Yes N/A N/A Moisture: Periwound Skin Color: No Abnormalities Noted N/A N/A Temperature: No Abnormality N/A N/A Tenderness on Yes N/A N/A Palpation: Wound Preparation: Ulcer Cleansing: N/A N/A Rinsed/Irrigated with Saline Topical Anesthetic Applied: None Treatment  Notes Electronic Signature(s) Signed: 11/28/2016 3:38:57 PM By: Curtis Sites Entered By: Curtis Sites on 11/28/2016 13:47:20 Laurie [name] ([id number]) -------------------------------------------------------------------------------- Multi-Disciplinary Care Plan Details Patient Name: Laurie [name] Date of Service: 11/28/2016 1:15 PM Medical Record Number: [id number] Patient Account Number: [id number] Date of Birth/Sex: [birth year] (70 y.o. Female) Treating RN: Curtis Sites Primary Care Physician: Earlie Lou Other Clinician: Referring Physician: Earlie Lou Treating Physician/Extender: Rudene Re in Treatment: 1 Active Inactive Electronic Signature(s) Signed: 12/09/2016 2:16:14 PM By: Elliot Gurney RN, BSN, Kim RN, BSN Signed: 12/09/2016 4:41:00 PM By: Curtis Sites Previous Signature: 11/28/2016 3:38:57 PM Version By: Curtis Sites Entered By: Elliot Gurney RN, BSN, Kim on 12/09/2016 14:16:14 Laurie [name] ([id number]) -------------------------------------------------------------------------------- Pain Assessment Details Patient Name: Laurie [name] Date of Service: 11/28/2016 1:15 PM Medical Record Number: [id number] Patient Account Number: [id number] Date of Birth/Sex: 07-Jan-1946 (70 y.o. Female) Treating RN: Curtis Sites Primary Care Physician: Earlie Lou Other Clinician: Referring Physician: Earlie Lou Treating Physician/Extender: Rudene Re in Treatment: 1 Active Problems Location of Pain Severity and Description of Pain Patient Has Paino Yes Site Locations Pain Location: Pain in Ulcers With Dressing Change: Yes Duration of the Pain. Constant / Intermittento Constant Pain Management and Medication Current Pain Management: Notes Topical or injectable lidocaine is offered to patient for acute pain when surgical debridement is performed. If needed, Patient is instructed to use over the counter pain medication for the following  24-48 hours after debridement. Wound care MDs do not prescribed pain medications. Patient has chronic pain or uncontrolled pain. Patient has been instructed to make an appointment with their Primary Care Physician for pain management. Electronic Signature(s) Signed: 11/28/2016 3:38:57 PM By: Curtis Sites Entered By: Curtis Sites on 11/28/2016 13:26:14 Laurie [name] ([id number]) -------------------------------------------------------------------------------- Patient/Caregiver Education Details Patient Name: Laurie [name] Date of Service: 11/28/2016 1:15 PM Medical Record Number: [id number] Patient Account Number: [id number] Date of Birth/Gender: 09/12/1946 (70 y.o. Female) Treating RN: Curtis Sites Primary Care Physician: Earlie Lou Other Clinician: Referring Physician: Earlie Lou Treating Physician/Extender: Rudene Re in Treatment: 1 Education Assessment Education Provided To: Patient Education Topics Provided Wound/Skin Impairment: Handouts: Other: wound care as ordered Methods: Demonstration, Explain/Verbal Responses: State content correctly Electronic Signature(s) Signed: 11/28/2016 3:38:57 PM By: Curtis Sites Entered By: Curtis Sites on 11/28/2016 15:07:53 Laurie [name] ([id number]) -------------------------------------------------------------------------------- Wound Assessment Details Patient Name: Laurie [name] Date of Service: 11/28/2016 1:15 PM Medical  Record Number: [id number] Patient Account Number: [id number] Date of Birth/Sex: 06/28/1946 (70 y.o. Female) Treating RN: Curtis Sitesorthy, Joanna Primary Care Physician: Earlie LouGARBA, MOHAMMAD Other Clinician: Referring Physician: Earlie LouGARBA, MOHAMMAD Treating Physician/Extender: Rudene ReBritto, Errol Weeks in Treatment: 1 Wound Status Wound Number: 1 Primary Pressure Ulcer Etiology: Wound Location: Calcaneus - Medial Wound Status: Open Wounding Event: Pressure Injury Comorbid Type II Diabetes,  Dementia, Date Acquired: 09/22/2016 History: Neuropathy Weeks Of Treatment: 1 Clustered Wound: No Photos Wound Measurements Length: (cm) 7.5 Width: (cm) 11.3 Depth: (cm) 0.1 Area: (cm) 66.562 Volume: (cm) 6.656 % Reduction in Area: -2.7% % Reduction in Volume: -2.7% Epithelialization: None Tunneling: No Undermining: No Wound Description Classification: Unstageable/Unclassified Foul Od Diabetic Severity (Wagner): Grade 1 Wound Margin: Flat and Intact Exudate Amount: Medium Exudate Type: Serous Exudate Color: amber or After Cleansing: No Wound Bed Granulation Amount: None Present (0%) Necrotic Amount: Large (67-100%) Necrotic Quality: Eschar Periwound Skin Texture Laurie BrownieINKNEY, Tenesia W. ([id number]) Texture Color No Abnormalities Noted: No No Abnormalities Noted: No Moisture Temperature / Pain No Abnormalities Noted: No Temperature: No Abnormality Dry / Scaly: Yes Tenderness on Palpation: Yes Wound Preparation Ulcer Cleansing: Rinsed/Irrigated with Saline Topical Anesthetic Applied: None Electronic Signature(s) Signed: 11/28/2016 3:38:57 PM By: Curtis Sitesorthy, Joanna Entered By: Curtis Sitesorthy, Joanna on 11/28/2016 13:46:59 Laurie BrowniePINKNEY, Ineze W. ([id number]) -------------------------------------------------------------------------------- Vitals Details Patient Name: Laurie BrowniePINKNEY, Shawntel W. Date of Service: 11/28/2016 1:15 PM Medical Record Number: [id number] Patient Account Number: [id number] Date of Birth/Sex: 06/28/1946 (70 y.o. Female) Treating RN: Curtis Sitesorthy, Joanna Primary Care Physician: Earlie LouGARBA, MOHAMMAD Other Clinician: Referring Physician: Earlie LouGARBA, MOHAMMAD Treating Physician/Extender: Rudene ReBritto, Errol Weeks in Treatment: 1 Vital Signs Time Taken: 13:26 Pulse (bpm): 84 Height (in): 67 Respiratory Rate (breaths/min): 18 Weight (lbs): 185 Blood Pressure (mmHg): 172/71 Body Mass Index (BMI): 29 Reference Range: 80 - 120 mg / dl Electronic Signature(s) Signed: 11/28/2016 3:38:57 PM By: Curtis Sitesorthy,  Joanna Entered By: Curtis Sitesorthy, Joanna on 11/28/2016 13:27:01

## 2016-11-29 NOTE — Progress Notes (Addendum)
Laurie [name], Laurie W. ([id number]) Visit Report for 11/28/2016 Chief Complaint Document Details Patient Name: Laurie [name], Laurie W. 11/28/2016 1:15 Date of Service: PM Medical Record [id number] Number: Patient Account Number: [id number] June 29, 1946 (70 y.o. Treating RN: Curtis Sitesorthy, Joanna Date of Birth/Sex: Female) Other Clinician: Primary Care Physician: Earlie LouGARBA, MOHAMMAD Treating Evlyn KannerBritto, Calvyn Kurtzman Referring Physician: Earlie LouGARBA, MOHAMMAD Physician/Extender: Tania AdeWeeks in Treatment: 1 Information Obtained from: Patient Chief Complaint [facility] today for evaluation of left heel ulcer Electronic Signature(s) Signed: 11/28/2016 2:08:46 PM By: Evlyn KannerBritto, Annasofia Pohl MD, FACS Entered By: Evlyn KannerBritto, Eberardo Demello on 11/28/2016 14:08:46 Laurie [name], Laurie W. ([id number]) -------------------------------------------------------------------------------- HPI Details Patient Name: Laurie [name], Laurie W. 11/28/2016 1:15 Date of Service: PM Medical Record [id number] Number: Patient Account Number: [id number] June 29, 1946 (70 y.o. Treating RN: Curtis Sitesorthy, Joanna Date of Birth/Sex: Female) Other Clinician: Primary Care Physician: Earlie LouGARBA, MOHAMMAD Treating Verner Mccrone Referring Physician: Earlie LouGARBA, MOHAMMAD Physician/Extender: Weeks in Treatment: 1 History of Present Illness Location: left heel Quality: denies pain Severity: denies pain Duration: patient unclear regarding exact timeframe but states since admission to [facility] (admission date 07/27/2016) Timing: denies pain Context: pressure Modifying Factors: presents today with Multi-Podus boot HPI Description: 11/21/2016 Ms. Tacker arrives to the clinic today for evaluation of a left heel pressure ulcer. She is unclear as to when ulcer actually occurred but states it was since arrival to [facility] in August. [facility] indicating onset of ulcer. She does arrive wearing a Multi-Podus boot. She is unaware of her recent blood sugar  levels, there are no records sent nor is there a recent A1c and Epic. 11/28/2016 -- the patient is known to have a left femoral to posterior tibial bypass with reverse saphenous vein graft in March 2016. He had had a previous right BKA. the patient was seen by Dr. Leonette Mostharles fields on 11/27/2016 who did a duplex ultrasound and showed a patent left femoral to posterior tibial artery bypass graft with monophasic waveform and ABI of 1.01. 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 He recommended consultative measures with offloading and if this wound continues to be opened she may eventually need a left above-knee amputation. she is not a candidate for further revascularization. He would see her back in 6 weeks' time. Of note she is a current every day smoker. 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 ([id number]) -------------------------------------------------------------------------------- Physical Exam Details Patient Name: Laurie [name], Laurie W. 11/28/2016 1:15 Date of Service: PM Medical Record [id number] Number: Patient Account Number: [id number] June 29, 1946 (70 y.o. Treating RN: Curtis Sitesorthy, Joanna Date of Birth/Sex: Female) Other Clinician: Primary Care Physician: Earlie LouGARBA, MOHAMMAD Treating Evlyn KannerBritto, Morenike Cuff Referring Physician: Earlie LouGARBA, MOHAMMAD Physician/Extender: Weeks in Treatment: 1 Constitutional . Pulse regular. Respirations normal and unlabored. Afebrile. . Eyes Nonicteric. Reactive to  light. Ears, Nose, Mouth, and Throat Lips, teeth, and gums WNL.Marland Kitchen. Moist mucosa without lesions. Neck supple and nontender. No palpable supraclavicular or cervical adenopathy. Normal sized without goiter. Respiratory WNL. No retractions.. Cardiovascular Pedal Pulses WNL. No clubbing, cyanosis or edema. Lymphatic No adneopathy. No adenopathy. No adenopathy. Musculoskeletal Adexa without tenderness or enlargement.. Digits and  nails w/o clubbing, cyanosis, infection, petechiae, ischemia, or inflammatory conditions.. Integumentary (Hair, Skin) No suspicious lesions. No crepitus or fluctuance. No peri-wound warmth or erythema. No masses.Marland Kitchen Psychiatric Judgement and insight Intact.. No evidence of depression, anxiety, or agitation.. Notes the patient has a area of full-thickness dry eschar on her left heel and this is not infected or not surrounded by cellulitis. No sharp debridement was recommended Electronic Signature(s) Signed: 11/28/2016 2:10:21 PM By: Evlyn Kanner MD, FACS Entered By: Evlyn Kanner on 11/28/2016 14:10:20 Laurie [name] ([id number]) -------------------------------------------------------------------------------- Physician Orders Details Patient Name: Laurie [name], Laurie [name] 11/28/2016 1:15 Date of Service: PM Medical Record [id number] Number: Patient Account Number: [id number] 09-Sep-1946 (70 y.o. Treating RN: Curtis Sites Date of Birth/Sex: Female) Other Clinician: Primary Care Physician: Earlie Lou Treating Karlis Cregg Referring Physician: Earlie Lou Physician/Extender: Tania Ade in Treatment: 1 Verbal / Phone Orders: Yes Clinician: Curtis Sites Read Back and Verified: Yes Diagnosis Coding Wound Cleansing Wound #1 Medial Calcaneus o Cleanse wound with mild soap and water Primary Wound Dressing Wound #1 Medial Calcaneus o Other: - Betadine Secondary Dressing Wound #1 Medial Calcaneus o Gauze and Kerlix/Conform - heel cup Dressing Change  Frequency Wound #1 Medial Calcaneus o Change dressing every day. Follow-up Appointments o Return Appointment in 1 week. Radiology o X-ray, heel - left - mobile xray is fine - facility to arrange for xray Electronic Signature(s) Signed: 11/28/2016 3:38:57 PM By: Curtis Sites Signed: 11/28/2016 3:45:07 PM By: Evlyn Kanner MD, FACS Entered By: Curtis Sites on 11/28/2016 13:52:58 Laurie [name] ([id number]) -------------------------------------------------------------------------------- Problem List Details Patient Name: FRANCENIA, CHIMENTI 11/28/2016 1:15 Date of Service: PM Medical Record [id number] Number: Patient Account Number: [id number] 1946/02/20 (70 y.o. 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 Treating RN: Curtis Sites Date of Birth/Sex: Female) Other Clinician: Primary Care Physician: Earlie Lou  Treating Evlyn Kanner Referring Physician: Earlie Lou Physician/Extender: Tania Ade in Treatment: 1 Subjective Chief Complaint Information obtained from Patient Laurie [name] clinic today for evaluation of left heel ulcer History of Present Illness (HPI) The following HPI elements were documented for the patient's wound: Location: left heel Quality: denies pain Severity: denies pain Duration: patient unclear regarding exact timeframe but states since admission to [facility] (admission date 07/27/2016) Timing: denies pain Context: pressure Modifying Factors: presents today with Multi-Podus boot 11/21/2016 Ms. Wattley arrives to the clinic today for evaluation of a left heel pressure ulcer. She is unclear as to when ulcer actually occurred but states it was since arrival to [facility] in August. No notes were sent from [facility] indicating onset of ulcer. She does arrive wearing a Multi-Podus boot. She is unaware of her recent blood sugar levels, there are no records sent nor is there a recent A1c and Epic. 11/28/2016 -- the  patient is known to have a left femoral to posterior tibial bypass with reverse saphenous vein graft in March 2016. He had had a previous right BKA. the patient was seen by Dr. Leonette Most fields on 11/27/2016 who did a duplex ultrasound and showed a patent left femoral to posterior tibial artery bypass graft with monophasic waveform and ABI of 1.01. 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 He recommended consultative measures with offloading and if this wound continues to be opened she may eventually need a left above-knee amputation. she is not a candidate for further revascularization. He would see her back in 6 weeks' time. Of note she is a current every day smoker. 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 ([id number]) Objective Constitutional Pulse regular. Respirations normal and unlabored. Afebrile. Vitals Time Taken: 1:26 PM, Height: 67 in, Weight: 185 lbs, BMI: 29, Pulse: 84 bpm, Respiratory Rate: 18 breaths/min, Blood Pressure: 172/71 mmHg. Eyes Nonicteric. Reactive to light. Ears, Nose, Mouth, and Throat Lips, teeth, and gums WNL.Marland Kitchen Moist mucosa without lesions. Neck supple and nontender. No palpable supraclavicular or cervical adenopathy. Normal sized without goiter. Respiratory WNL. No retractions.. Cardiovascular Pedal Pulses WNL. No clubbing, cyanosis or edema. Lymphatic No adneopathy. No adenopathy. No adenopathy. Musculoskeletal Adexa without tenderness or enlargement.. Digits and nails w/o clubbing, cyanosis, infection, petechiae, ischemia, or inflammatory conditions.Marland Kitchen Psychiatric Judgement and insight Intact.. No evidence of depression, anxiety, or agitation.. General Notes: the patient has a area of full-thickness dry eschar on her left heel and this is not infected or not surrounded by cellulitis. No sharp debridement was recommended Integumentary (Hair, Skin) No suspicious lesions. No crepitus or fluctuance. No peri-wound warmth or erythema. No masses.. Wound #1 status is Open. Original cause of wound was Pressure Injury. The wound is located on the Medial Calcaneus. The wound measures 7.5cm length x 11.3cm width x 0.1cm depth; 66.562cm^2 area and 6.656cm^3 volume. There is no tunneling or undermining noted. There is a medium amount of serous drainage noted. The wound margin is flat and intact. There is no granulation within the wound bed. There is a large (67-100%) amount of necrotic tissue within the wound bed including Eschar. The periwound skin appearance exhibited: Dry/Scaly.  Periwound temperature was noted as No Abnormality. The periwound has TYRONE, BALASH. ([id number]) tenderness on palpation. 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 Present are have recommended: 1. Painting this heal with Betadine and offloading it to the best possible way. 2. good control of her diabetes mellitus 3. x-ray of the left heel and foot 4. I have spent 3 minutes discussing with her the need to completely give up smoking and I discussed the risks benefits and alternatives the patient is not a candidate for hyperbaric oxygen therapy especially because she has no vascular options to improve the blood flow and the fact that her  heel is involved. Plan Wound Cleansing: Wound #1 Medial Calcaneus: Cleanse wound with mild soap and water Primary Wound Dressing: Wound #1 Medial Calcaneus: Other: - Betadine Secondary Dressing: Wound #1 Medial Calcaneus: Gauze and Kerlix/Conform - heel cup Dressing Change Frequency: Wound #1 Medial Calcaneus: Change dressing every day. Laurie [name], Shanicka W. ([id number]) Follow-up Appointments: Return Appointment in 1 week. Radiology ordered were: X-ray, heel - left - mobile xray is fine - facility to arrange for xray this 70 year old diabetic who has a history of nicotine addiction and severe peripheral vascular disease was recently seen by her vascular surgeon who does not recommend any revascularization at this stage. Present are have recommended: 1.  Painting this heal with Betadine and offloading it to the best possible way. 2. good control of her diabetes mellitus 3. x-ray of the left heel and foot 4. I have spent 3 minutes discussing with her the need to completely give up smoking and I discussed the risks benefits and alternatives the patient is not a candidate for hyperbaric oxygen therapy especially because she has no vascular options to improve the blood flow and the fact that her heel is involved. Electronic Signature(s) Signed: 11/28/2016 2:13:17 PM By: Evlyn KannerBritto, Pati Thinnes MD, FACS Entered By: Evlyn KannerBritto, Melbourne Jakubiak on 11/28/2016 14:13:16 Laurie [name], Oksana W. ([id number]) -------------------------------------------------------------------------------- SuperBill Details Patient Name: Laurie [name], Sharnae W. Date of Service: 11/28/2016 Medical Record Number: [id number] Patient Account Number: [id number] Date of Birth/Sex: [birth year] (70 y.o. 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, cigarettes, with other nicotine-induced disorders Facility Procedures CPT4 Code Description: [id number] 99213 - WOUND CARE VISIT-LEV 3 EST PT Modifier: Quantity: 1 CPT4 Code Description: [id number] 99406-SMOKING CESSATION 3-10MINS ICD-10 Description Diagnosis E11.52 Type 2 diabetes mellitus with diabetic peripheral a L89.620 Pressure ulcer of left heel, unstageable F17.218 Nicotine dependence, cigarettes, with other  nicotin Modifier: ngiopathy wi e-induced di Quantity: 1 th gangrene sorders Physician Procedures CPT4 Code Description:  [id number] 99213 - WC PHYS LEVEL 3 - EST PT ICD-10 Description Diagnosis E11.52 Type 2 diabetes mellitus with diabetic peripheral a L89.620 Pressure ulcer of left heel, unstageable I73.9 Peripheral vascular disease, unspecified Modifier: ngiopathy wit Quantity: 1 h gangrene CPT4 Code Description: [phone] 99406- SMOKING CESSATION 3-10 MINS ICD-10 Description Diagnosis E11.52 Type 2 diabetes mellitus with diabetic peripheral a L89.620 Pressure ulcer of left heel, unstageable F17.218 Nicotine dependence, cigarettes, with other  nicotin Laurie [name], Keianna W. ([id number]) Modifier: ngiopathy wit e-induced dis Quantity: 1 h gangrene orders Electronic Signature(s) Signed: 11/28/2016 2:13:41 PM By: Evlyn KannerBritto, Ariani Seier MD, FACS Entered By: Evlyn KannerBritto, Kervin Bones on 11/28/2016 14:13:41

## 2016-12-02 ENCOUNTER — Emergency Department: Payer: Medicare Other

## 2016-12-02 ENCOUNTER — Inpatient Hospital Stay
Admission: EM | Admit: 2016-12-02 | Discharge: 2016-12-08 | DRG: 300 | Disposition: A | Discharge status: Skilled Nursing Facility | Payer: Medicare Other | Attending: Internal Medicine | Admitting: Internal Medicine

## 2016-12-02 DIAGNOSIS — L97429 Non-pressure chronic ulcer of left heel and midfoot with unspecified severity: Secondary | ICD-10-CM

## 2016-12-02 DIAGNOSIS — I70248 Atherosclerosis of native arteries of left leg with ulceration of other part of lower left leg: Secondary | ICD-10-CM | POA: Diagnosis not present

## 2016-12-02 DIAGNOSIS — E11621 Type 2 diabetes mellitus with foot ulcer: Secondary | ICD-10-CM | POA: Diagnosis present

## 2016-12-02 DIAGNOSIS — Z79891 Long term (current) use of opiate analgesic: Secondary | ICD-10-CM

## 2016-12-02 DIAGNOSIS — L03116 Cellulitis of left lower limb: Secondary | ICD-10-CM

## 2016-12-02 DIAGNOSIS — L89629 Pressure ulcer of left heel, unspecified stage: Secondary | ICD-10-CM | POA: Diagnosis present

## 2016-12-02 DIAGNOSIS — F329 Major depressive disorder, single episode, unspecified: Secondary | ICD-10-CM | POA: Diagnosis present

## 2016-12-02 DIAGNOSIS — R739 Hyperglycemia, unspecified: Secondary | ICD-10-CM

## 2016-12-02 DIAGNOSIS — I10 Essential (primary) hypertension: Secondary | ICD-10-CM | POA: Diagnosis present

## 2016-12-02 DIAGNOSIS — G47419 Narcolepsy without cataplexy: Secondary | ICD-10-CM | POA: Diagnosis present

## 2016-12-02 DIAGNOSIS — I70261 Atherosclerosis of native arteries of extremities with gangrene, right leg: Secondary | ICD-10-CM | POA: Diagnosis not present

## 2016-12-02 DIAGNOSIS — Z7982 Long term (current) use of aspirin: Secondary | ICD-10-CM

## 2016-12-02 DIAGNOSIS — Z794 Long term (current) use of insulin: Secondary | ICD-10-CM

## 2016-12-02 DIAGNOSIS — F419 Anxiety disorder, unspecified: Secondary | ICD-10-CM | POA: Diagnosis present

## 2016-12-02 DIAGNOSIS — I96 Gangrene, not elsewhere classified: Secondary | ICD-10-CM | POA: Diagnosis present

## 2016-12-02 DIAGNOSIS — Z791 Long term (current) use of non-steroidal anti-inflammatories (NSAID): Secondary | ICD-10-CM

## 2016-12-02 DIAGNOSIS — I739 Peripheral vascular disease, unspecified: Secondary | ICD-10-CM | POA: Diagnosis present

## 2016-12-02 DIAGNOSIS — F17210 Nicotine dependence, cigarettes, uncomplicated: Secondary | ICD-10-CM | POA: Diagnosis present

## 2016-12-02 DIAGNOSIS — Z8249 Family history of ischemic heart disease and other diseases of the circulatory system: Secondary | ICD-10-CM

## 2016-12-02 DIAGNOSIS — Z79899 Other long term (current) drug therapy: Secondary | ICD-10-CM

## 2016-12-02 DIAGNOSIS — Z89511 Acquired absence of right leg below knee: Secondary | ICD-10-CM

## 2016-12-02 DIAGNOSIS — Z7902 Long term (current) use of antithrombotics/antiplatelets: Secondary | ICD-10-CM

## 2016-12-02 DIAGNOSIS — L97409 Non-pressure chronic ulcer of unspecified heel and midfoot with unspecified severity: Secondary | ICD-10-CM

## 2016-12-02 DIAGNOSIS — IMO0002 Reserved for concepts with insufficient information to code with codable children: Secondary | ICD-10-CM

## 2016-12-02 DIAGNOSIS — E785 Hyperlipidemia, unspecified: Secondary | ICD-10-CM | POA: Diagnosis present

## 2016-12-02 DIAGNOSIS — L089 Local infection of the skin and subcutaneous tissue, unspecified: Secondary | ICD-10-CM | POA: Diagnosis present

## 2016-12-02 DIAGNOSIS — I69344 Monoplegia of lower limb following cerebral infarction affecting left non-dominant side: Secondary | ICD-10-CM

## 2016-12-02 DIAGNOSIS — L8990 Pressure ulcer of unspecified site, unspecified stage: Secondary | ICD-10-CM | POA: Insufficient documentation

## 2016-12-02 DIAGNOSIS — E1165 Type 2 diabetes mellitus with hyperglycemia: Secondary | ICD-10-CM | POA: Diagnosis present

## 2016-12-02 DIAGNOSIS — I708 Atherosclerosis of other arteries: Secondary | ICD-10-CM | POA: Diagnosis present

## 2016-12-02 DIAGNOSIS — E119 Type 2 diabetes mellitus without complications: Secondary | ICD-10-CM | POA: Diagnosis not present

## 2016-12-02 DIAGNOSIS — R51 Headache: Secondary | ICD-10-CM | POA: Diagnosis present

## 2016-12-02 DIAGNOSIS — M6281 Muscle weakness (generalized): Secondary | ICD-10-CM

## 2016-12-02 DIAGNOSIS — Z880 Allergy status to penicillin: Secondary | ICD-10-CM | POA: Diagnosis not present

## 2016-12-02 DIAGNOSIS — D638 Anemia in other chronic diseases classified elsewhere: Secondary | ICD-10-CM | POA: Diagnosis present

## 2016-12-02 DIAGNOSIS — Z792 Long term (current) use of antibiotics: Secondary | ICD-10-CM

## 2016-12-02 DIAGNOSIS — E1152 Type 2 diabetes mellitus with diabetic peripheral angiopathy with gangrene: Principal | ICD-10-CM | POA: Diagnosis present

## 2016-12-02 DIAGNOSIS — E876 Hypokalemia: Secondary | ICD-10-CM | POA: Diagnosis present

## 2016-12-02 DIAGNOSIS — R262 Difficulty in walking, not elsewhere classified: Secondary | ICD-10-CM

## 2016-12-02 LAB — BASIC METABOLIC PANEL
Anion gap: 10 (ref 5–15)
BUN: 23 mg/dL — AB (ref 6–20)
CO2: 31 mmol/L (ref 22–32)
CREATININE: 1.13 mg/dL — AB (ref 0.44–1.00)
Calcium: 8.6 mg/dL — ABNORMAL LOW (ref 8.9–10.3)
Chloride: 88 mmol/L — ABNORMAL LOW (ref 101–111)
GFR calc Af Amer: 57 mL/min — ABNORMAL LOW (ref >60)
GFR, EST NON AFRICAN AMERICAN: 49 mL/min — AB (ref >60)
GLUCOSE: 584 mg/dL — AB (ref 65–99)
Potassium: 3.7 mmol/L (ref 3.5–5.1)
SODIUM: 129 mmol/L — AB (ref 135–145)

## 2016-12-02 LAB — CBC WITH DIFFERENTIAL/PLATELET
Basophils Absolute: 0.1 10*3/uL (ref 0–0.1)
Basophils Relative: 1 %
EOS PCT: 1 %
Eosinophils Absolute: 0.1 10*3/uL (ref 0–0.7)
HCT: 34.4 % — ABNORMAL LOW (ref 35.0–47.0)
Hemoglobin: 11.5 g/dL — ABNORMAL LOW (ref 12.0–16.0)
LYMPHS ABS: 3 10*3/uL (ref 1.0–3.6)
Lymphocytes Relative: 32 %
MCH: 31.2 pg (ref 26.0–34.0)
MCHC: 33.5 g/dL (ref 32.0–36.0)
MCV: 93 fL (ref 80.0–100.0)
MONOS PCT: 8 %
Monocytes Absolute: 0.7 10*3/uL (ref 0.2–0.9)
Neutro Abs: 5.4 10*3/uL (ref 1.4–6.5)
Neutrophils Relative %: 58 %
PLATELETS: 283 10*3/uL (ref 150–440)
RBC: 3.7 MIL/uL — ABNORMAL LOW (ref 3.80–5.20)
RDW: 12.2 % (ref 11.5–14.5)
WBC: 9.3 10*3/uL (ref 3.6–11.0)

## 2016-12-02 LAB — GLUCOSE, CAPILLARY
GLUCOSE-CAPILLARY: 421 mg/dL — AB (ref 65–99)
GLUCOSE-CAPILLARY: 423 mg/dL — AB (ref 65–99)

## 2016-12-02 MED ORDER — DULOXETINE HCL 60 MG PO CPEP
60.0000 mg | ORAL_CAPSULE | Freq: Every day | ORAL | Status: DC
  Administered 2016-12-02 – 2016-12-08 (×6): 60 mg via ORAL
  Filled 2016-12-02 (×6): qty 1

## 2016-12-02 MED ORDER — ACETAMINOPHEN 325 MG PO TABS: 650.0000 mg | ORAL_TABLET | Freq: Four times a day (QID) | ORAL | Status: DC | PRN

## 2016-12-02 MED ORDER — AMPHETAMINE-DEXTROAMPHETAMINE 5 MG PO TABS
10.0000 mg | ORAL_TABLET | Freq: Two times a day (BID) | ORAL | Status: DC
  Administered 2016-12-04 – 2016-12-08 (×7): 10 mg via ORAL
  Filled 2016-12-02 (×7): qty 2

## 2016-12-02 MED ORDER — INSULIN ASPART 100 UNIT/ML ~~LOC~~ SOLN
5.0000 [IU] | Freq: Once | SUBCUTANEOUS | Status: AC
  Administered 2016-12-02: 5 [IU] via INTRAVENOUS
  Filled 2016-12-02: qty 5

## 2016-12-02 MED ORDER — CITALOPRAM HYDROBROMIDE 20 MG PO TABS
10.0000 mg | ORAL_TABLET | Freq: Every day | ORAL | Status: DC
  Administered 2016-12-02: 10 mg via ORAL
  Filled 2016-12-02: qty 1

## 2016-12-02 MED ORDER — HYDROCODONE-ACETAMINOPHEN 5-325 MG PO TABS
1.0000 | ORAL_TABLET | ORAL | Status: DC | PRN
  Administered 2016-12-03 – 2016-12-07 (×4): 2 via ORAL
  Administered 2016-12-08: 1 via ORAL
  Filled 2016-12-02 (×2): qty 2
  Filled 2016-12-02: qty 1
  Filled 2016-12-02: qty 2

## 2016-12-02 MED ORDER — SODIUM CHLORIDE 0.9 % IV BOLUS (SEPSIS)
500.0000 mL | Freq: Once | INTRAVENOUS | Status: AC
  Administered 2016-12-02: 500 mL via INTRAVENOUS

## 2016-12-02 MED ORDER — CEFEPIME-DEXTROSE 2 GM/50ML IV SOLR
2.0000 g | INTRAVENOUS | Status: AC
  Administered 2016-12-02: 2 g via INTRAVENOUS
  Filled 2016-12-02: qty 50

## 2016-12-02 MED ORDER — INSULIN ASPART 100 UNIT/ML ~~LOC~~ SOLN
0.0000 [IU] | Freq: Every day | SUBCUTANEOUS | Status: DC
  Administered 2016-12-02: 5 [IU] via SUBCUTANEOUS
  Administered 2016-12-04: 2 [IU] via SUBCUTANEOUS
  Administered 2016-12-06: 4 [IU] via SUBCUTANEOUS
  Administered 2016-12-07: 3 [IU] via SUBCUTANEOUS
  Filled 2016-12-02: qty 2
  Filled 2016-12-02: qty 5
  Filled 2016-12-02: qty 3
  Filled 2016-12-02: qty 4

## 2016-12-02 MED ORDER — KETOROLAC TROMETHAMINE 15 MG/ML IJ SOLN
15.0000 mg | Freq: Four times a day (QID) | INTRAMUSCULAR | Status: AC | PRN
Start: 2016-12-02 — End: 2016-12-07

## 2016-12-02 MED ORDER — INSULIN ASPART 100 UNIT/ML ~~LOC~~ SOLN
0.0000 [IU] | Freq: Three times a day (TID) | SUBCUTANEOUS | Status: DC
  Administered 2016-12-03: 2 [IU] via SUBCUTANEOUS
  Administered 2016-12-03: 5 [IU] via SUBCUTANEOUS
  Administered 2016-12-04: 3 [IU] via SUBCUTANEOUS
  Administered 2016-12-04: 5 [IU] via SUBCUTANEOUS
  Administered 2016-12-04 – 2016-12-05 (×2): 2 [IU] via SUBCUTANEOUS
  Administered 2016-12-06 (×3): 3 [IU] via SUBCUTANEOUS
  Administered 2016-12-07: 5 [IU] via SUBCUTANEOUS
  Administered 2016-12-07: 2 [IU] via SUBCUTANEOUS
  Administered 2016-12-07 – 2016-12-08 (×2): 3 [IU] via SUBCUTANEOUS
  Administered 2016-12-08: 2 [IU] via SUBCUTANEOUS
  Filled 2016-12-02 (×2): qty 3
  Filled 2016-12-02: qty 2
  Filled 2016-12-02: qty 3
  Filled 2016-12-02: qty 5
  Filled 2016-12-02: qty 2
  Filled 2016-12-02: qty 5
  Filled 2016-12-02: qty 2
  Filled 2016-12-02: qty 3
  Filled 2016-12-02: qty 2
  Filled 2016-12-02: qty 5
  Filled 2016-12-02 (×2): qty 3
  Filled 2016-12-02: qty 2

## 2016-12-02 MED ORDER — CLOPIDOGREL BISULFATE 75 MG PO TABS
75.0000 mg | ORAL_TABLET | Freq: Every day | ORAL | Status: DC
  Administered 2016-12-02: 75 mg via ORAL
  Filled 2016-12-02: qty 1

## 2016-12-02 MED ORDER — ATORVASTATIN CALCIUM 10 MG PO TABS
10.0000 mg | ORAL_TABLET | Freq: Every day | ORAL | Status: DC
  Administered 2016-12-03 – 2016-12-07 (×5): 10 mg via ORAL
  Filled 2016-12-02 (×5): qty 1

## 2016-12-02 MED ORDER — INSULIN GLARGINE 100 UNIT/ML ~~LOC~~ SOLN
20.0000 [IU] | Freq: Every day | SUBCUTANEOUS | Status: DC
  Administered 2016-12-02: 20 [IU] via SUBCUTANEOUS
  Filled 2016-12-02 (×3): qty 0.2

## 2016-12-02 MED ORDER — SODIUM CHLORIDE 0.9 % IV SOLN
INTRAVENOUS | Status: DC
  Administered 2016-12-02 – 2016-12-06 (×7): via INTRAVENOUS

## 2016-12-02 MED ORDER — HYDROCHLOROTHIAZIDE 12.5 MG PO CAPS
12.5000 mg | ORAL_CAPSULE | Freq: Every day | ORAL | Status: DC
  Administered 2016-12-02: 12.5 mg via ORAL
  Filled 2016-12-02: qty 1

## 2016-12-02 MED ORDER — MODAFINIL 100 MG PO TABS
100.0000 mg | ORAL_TABLET | Freq: Every day | ORAL | Status: DC
  Administered 2016-12-03: 100 mg via ORAL
  Filled 2016-12-02: qty 1

## 2016-12-02 MED ORDER — VANCOMYCIN HCL IN DEXTROSE 750-5 MG/150ML-% IV SOLN
750.0000 mg | Freq: Two times a day (BID) | INTRAVENOUS | Status: DC
  Administered 2016-12-03 – 2016-12-05 (×6): 750 mg via INTRAVENOUS
  Filled 2016-12-02 (×10): qty 150

## 2016-12-02 MED ORDER — DEXTROSE 5 % IV SOLN
2.0000 g | Freq: Two times a day (BID) | INTRAVENOUS | Status: DC
  Administered 2016-12-03 – 2016-12-04 (×3): 2 g via INTRAVENOUS
  Filled 2016-12-02 (×5): qty 2

## 2016-12-02 MED ORDER — ONDANSETRON HCL 4 MG/2ML IJ SOLN: 4.0000 mg | Freq: Four times a day (QID) | INTRAMUSCULAR | Status: DC | PRN

## 2016-12-02 MED ORDER — ONDANSETRON HCL 4 MG PO TABS: 4.0000 mg | ORAL_TABLET | Freq: Four times a day (QID) | ORAL | Status: DC | PRN

## 2016-12-02 MED ORDER — ALPRAZOLAM 0.25 MG PO TABS
0.2500 mg | ORAL_TABLET | Freq: Two times a day (BID) | ORAL | Status: DC
  Administered 2016-12-02 – 2016-12-08 (×11): 0.25 mg via ORAL
  Filled 2016-12-02 (×11): qty 1

## 2016-12-02 MED ORDER — ASPIRIN EC 81 MG PO TBEC
81.0000 mg | DELAYED_RELEASE_TABLET | Freq: Every day | ORAL | Status: DC
  Administered 2016-12-02 – 2016-12-08 (×6): 81 mg via ORAL
  Filled 2016-12-02 (×6): qty 1

## 2016-12-02 MED ORDER — ACETAMINOPHEN 650 MG RE SUPP: 650.0000 mg | Freq: Four times a day (QID) | RECTAL | Status: DC | PRN

## 2016-12-02 MED ORDER — ENOXAPARIN SODIUM 40 MG/0.4ML ~~LOC~~ SOLN
40.0000 mg | SUBCUTANEOUS | Status: DC
  Administered 2016-12-02 – 2016-12-07 (×5): 40 mg via SUBCUTANEOUS
  Filled 2016-12-02 (×5): qty 0.4

## 2016-12-02 MED ORDER — VANCOMYCIN HCL IN DEXTROSE 1-5 GM/200ML-% IV SOLN
1000.0000 mg | INTRAVENOUS | Status: AC
  Administered 2016-12-02: 1000 mg via INTRAVENOUS
  Filled 2016-12-02: qty 200

## 2016-12-02 MED ORDER — LISINOPRIL 20 MG PO TABS
20.0000 mg | ORAL_TABLET | Freq: Every day | ORAL | Status: DC
  Administered 2016-12-03 – 2016-12-08 (×6): 20 mg via ORAL
  Filled 2016-12-02 (×6): qty 1

## 2016-12-02 MED ORDER — SENNOSIDES-DOCUSATE SODIUM 8.6-50 MG PO TABS: 1.0000 | ORAL_TABLET | Freq: Every evening | ORAL | Status: DC | PRN

## 2016-12-02 MED ORDER — OXYCODONE-ACETAMINOPHEN 5-325 MG PO TABS
1.0000 | ORAL_TABLET | Freq: Four times a day (QID) | ORAL | Status: DC | PRN
  Administered 2016-12-02: 2 via ORAL
  Administered 2016-12-03: 1 via ORAL
  Administered 2016-12-04 – 2016-12-06 (×5): 2 via ORAL
  Filled 2016-12-02 (×5): qty 2
  Filled 2016-12-02: qty 1
  Filled 2016-12-02 (×2): qty 2

## 2016-12-02 MED ORDER — GABAPENTIN 300 MG PO CAPS
300.0000 mg | ORAL_CAPSULE | Freq: Three times a day (TID) | ORAL | Status: DC
  Administered 2016-12-02 – 2016-12-03 (×2): 300 mg via ORAL
  Filled 2016-12-02 (×2): qty 1

## 2016-12-02 NOTE — ED Provider Notes (Signed)
[facility] Emergency Department Provider Note  ____________________________________________   First MD Initiated Contact with Patient 12/02/16 1533     (approximate)  I have reviewed the triage vital signs and the nursing notes.   HISTORY  Chief Complaint Skin Ulcer   HPI Laurie [name] is a 70 y.o. female with a history of diabetes as well as peripheral vascular disease was present with a left heel ulcer. She is here with her daughter says that there is an increase in swelling of the left lower extremity as well as left foot over the past week with a discoloration. The skin has darkened of the left foot. There are also concerned about the left heel which has an ulcer which has become black and necrotic. The patient says that she is also having pain when there is pressure over the heel ulcer.   Past Medical History:  Diagnosis Date  . Anemia   . Anxiety   . Bacteremia   . Bursitis    "left hip; sometimes left shoulder" (03/05/2015)  . Chest pain    Troponins up to 0.12 while hospitalized for E.coli PNA  . Depression   . Family history of adverse reaction to anesthesia    "brother died 07/11/2013; woke up w/headache, vomited blood; drilled holes in his head; bleeding stroke; never came back" (03/05/2015)  . Headache    "when someone gets on my nerves" (03/05/2015)  . High cholesterol   . Hypertension   . Narcolepsy   . 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 Stroke [facility]) 10/2014   "weaker on left side since" (03/05/2015)  . Type II diabetes mellitus (HCC)   . Walking pneumonia 04/2009   "double" /notes 04/23/2011    Patient Active Problem List   Diagnosis Date Noted  . PVD (peripheral vascular disease) (HCC) 03/05/2015  . S/P BKA (below knee amputation) (HCC) 11/23/2014  . Depression 11/11/2014  . Gangrene of foot (HCC)   . Hypokalemia   . Peripheral vascular disease (HCC) 11/02/2014  . Gangrene (HCC) 11/02/2014  . Wound,  surgical, infected 11/01/2014  . Diabetic foot ulcer (HCC) 11/01/2014  . Trochanteric bursitis of left hip 05/29/2014  . Spastic hemiplegia affecting nondominant side (HCC) 12/02/2013  . Chronic diastolic heart failure, grade 2 10/26/2013  . Dyslipidemia 10/26/2013  . CVA (cerebral infarction) 10/25/2013  . Arthralgia of hip 10/25/2013  . Anemia 05/17/2009  . Diabetes mellitus type 2 with peripheral artery disease (HCC) 05/08/2009  . Anxiety state 05/08/2009  . Essential hypertension 05/08/2009    Past Surgical History:  Procedure Laterality Date  . ABDOMINAL AORTAGRAM N/A 03/05/2015   Procedure: ABDOMINAL Ronny Flurry;  Surgeon: Chuck Hint, MD;  Location: [facility] CATH LAB;  Service: Cardiovascular;  Laterality: N/A;  . AMPUTATION Right 11/03/2014   Procedure: AMPUTATION BELOW KNEE;  Surgeon: Sherren Kerns, MD;  Location: [facility] OR;  Service: Vascular;  Laterality: Right;  . FEMORAL-TIBIAL BYPASS GRAFT Left 03/06/2015   Procedure: BYPASS GRAFT Left FEMORAL-POSTERIOR TIBIAL ARTERY with reversed saphenous vein;  Surgeon: Sherren Kerns, MD;  Location: [facility] OR;  Service: Vascular;  Laterality: Left;  . INTRAOPERATIVE ARTERIOGRAM Left 03/06/2015   Procedure: INTRA OPERATIVE ARTERIOGRAM;  Surgeon: Sherren Kerns, MD;  Location: [facility] OR;  Service: Vascular;  Laterality: Left;  . LOWER EXTREMITY ANGIOGRAM N/A 10/03/2014   Procedure: LOWER EXTREMITY ANGIOGRAM;  Surgeon: Pamella Pert, MD;  Location: [facility] CATH LAB;  Service: Cardiovascular;  Laterality: N/A;  Prior to Admission medications   Medication Sig Start Date End Date Taking? Authorizing Provider  ALPRAZolam Prudy Feeler(XANAX) 0.25 MG tablet Take one tablet by mouth twice daily as needed for anxiety Patient taking differently: Take 0.25 mg by mouth 2 (two) times daily. Take one tablet by mouth twice daily as needed for anxiety 11/10/14   Tiffany L Reed, DO  amphetamine-dextroamphetamine (ADDERALL) 10 MG tablet Take 10 mg by mouth 2 (two) times  daily with a meal. Reported on 07/02/2016    Historical Provider, MD  aspirin EC 81 MG tablet Take 81 mg by mouth daily.    Historical Provider, MD  atorvastatin (LIPITOR) 10 MG tablet Take 1 tablet (10 mg total) by mouth daily at 6 PM. 11/04/13   Evlyn KannerPamela S Love, PA-C  cephALEXin (KEFLEX) 500 MG capsule Take 1 capsule (500 mg total) by mouth 3 (three) times daily. Patient not taking: Reported on 11/27/2016 01/30/15   Vivi BarrackMatthew R Wagoner, DPM  Cholecalciferol (VITAMIN D3 PO) Take 1 tablet by mouth daily.    Historical Provider, MD  cilostazol (PLETAL) 100 MG tablet Take 1 tablet (100 mg total) by mouth 2 (two) times daily. Patient not taking: Reported on 11/27/2016 10/03/14   Yates DecampJay Ganji, MD  citalopram (CELEXA) 10 MG tablet Take 10 mg by mouth daily.    Historical Provider, MD  clopidogrel (PLAVIX) 75 MG tablet Take 75 mg by mouth daily.    Historical Provider, MD  Coenzyme Q10 (COQ10 PO) Take 1 capsule by mouth daily.    Historical Provider, MD  diclofenac sodium (VOLTAREN) 1 % GEL Apply 4 g topically 4 (four) times daily. 06/20/16   April Palumbo, MD  DULoxetine (CYMBALTA) 60 MG capsule Take 60 mg by mouth daily.    Historical Provider, MD  gabapentin (NEURONTIN) 300 MG capsule Take 1 capsule (300 mg total) by mouth 2 (two) times daily. Patient taking differently: Take 300 mg by mouth 3 (three) times daily.  03/13/14   Erick ColaceAndrew E Kirsteins, MD  hydrochlorothiazide (MICROZIDE) 12.5 MG capsule Take 1 capsule (12.5 mg total) by mouth daily. 11/07/14   Jeralyn BennettEzequiel Zamora, MD  HYDROcodone-acetaminophen (NORCO/VICODIN) 5-325 MG per tablet Take one tablet by mouth every 6 hours as needed for pain 11/21/14   Oneal GroutMahima Pandey, MD  insulin glargine (LANTUS) 100 UNIT/ML injection Inject 0.2 mLs (20 Units total) into the skin at bedtime. 11/07/14   Jeralyn BennettEzequiel Zamora, MD  lisinopril (PRINIVIL,ZESTRIL) 20 MG tablet Take 1 tablet (20 mg total) by mouth daily. 11/07/14   Jeralyn BennettEzequiel Zamora, MD  modafinil (PROVIGIL) 100 MG tablet Take 1  tablet (100 mg total) by mouth daily. 07/02/16   Huston FoleySaima Athar, MD  oxyCODONE-acetaminophen (PERCOCET/ROXICET) 5-325 MG per tablet Take 1-2 tablets by mouth every 6 (six) hours as needed for severe pain. 03/10/15   Raymond GurneyKimberly A Trinh, PA-C  SANTYL ointment Apply 1 application topically daily.  10/26/14   Historical Provider, MD  silver sulfADIAZINE (SILVADENE) 1 % cream Apply 1 application topically daily. Patient not taking: Reported on 11/27/2016 01/30/15   Vivi BarrackMatthew R Wagoner, DPM  sulfamethoxazole-trimethoprim (SEPTRA DS) 800-160 MG per tablet Take 1 tablet by mouth 2 (two) times daily. 02/23/15   Santiago GladHeather Laisure, PA-C    Allergies Penicillins  Family History  Problem Relation Age of Onset  . CAD Mother   . CAD Father     Social History Social History  Substance Use Topics  . Smoking status: Current Every Day Smoker    Packs/day: 0.10    Years: 40.00  Types: Cigarettes  . Smokeless tobacco: Never Used  . Alcohol use 0.0 oz/week     Comment: 03/05/2015 "Might have a drink on holidays"    Review of Systems Constitutional: No fever/chills Eyes: No visual changes. ENT: No sore throat. Cardiovascular: Denies chest pain. Respiratory: Denies shortness of breath. Gastrointestinal: No abdominal pain.  No nausea, no vomiting.  No diarrhea.  No constipation. Genitourinary: Negative for dysuria. Musculoskeletal: Negative for back pain. Skin: Negative for rash. Neurological: Negative for headaches, focal weakness or numbness.  10-point ROS otherwise negative.  ____________________________________________   PHYSICAL EXAM:  VITAL SIGNS: ED Triage Vitals  Enc Vitals Group     BP 12/02/16 1522 103/66     Pulse Rate 12/02/16 1523 85     Resp --      Temp 12/02/16 1522 97.9 F (36.6 C)     Temp src --      SpO2 12/02/16 1523 96 %     Weight 12/02/16 1523 190 lb (86.2 kg)     Height 12/02/16 1523 5\' 8"  (1.727 m)     Head Circumference --      Peak Flow --      Pain Score 12/02/16  1524 10     Pain Loc --      Pain Edu? --      Excl. in GC? --     Constitutional: Alert and oriented. Well appearing and in no acute distress. Eyes: Conjunctivae are normal. PERRL. EOMI. Head: Atraumatic. Nose: No congestion/rhinnorhea. Mouth/Throat: Mucous membranes are moist.   Neck: No stridor.   Cardiovascular: Normal rate, regular rhythm. Grossly normal heart sounds.  Good peripheral circulation with dorsalis pedis pulse present in the left foot. Respiratory: Normal respiratory effort.  No retractions. Lungs CTAB. Gastrointestinal: Soft and nontender. No distention. No abdominal bruits. No CVA tenderness. Musculoskeletal: Right lower extremity BKA. Left lower extremity edema with necrotic heel ulcer. After foot is swollen and edematous and is warm to touch. Neurologic:  Normal speech and language. No gross focal neurologic deficits are appreciated.  Skin:  Skin is warm, dry and intact. No rash noted. Psychiatric: Mood and affect are normal. Speech and behavior are normal.  ____________________________________________   LABS (all labs ordered are listed, but only abnormal results are displayed)  Labs Reviewed  CBC WITH DIFFERENTIAL/PLATELET - Abnormal; Notable for the following:       Result Value   RBC 3.70 (*)    Hemoglobin 11.5 (*)    HCT 34.4 (*)    All other components within normal limits  BASIC METABOLIC PANEL - Abnormal; Notable for the following:    Sodium 129 (*)    Chloride 88 (*)    Glucose, Bld 584 (*)    BUN 23 (*)    Creatinine, Ser 1.13 (*)    Calcium 8.6 (*)    GFR calc non Af Amer 49 (*)    GFR calc Af Amer 57 (*)    All other components within normal limits  CULTURE, BLOOD (ROUTINE X 2)  CULTURE, BLOOD (ROUTINE X 2)   ____________________________________________  EKG   ____________________________________________  RADIOLOGY  DG Foot Complete Left (Final result)  Result time 12/02/16 16:14:20  Final result by Aurther Lofthomas Register Jr., MD  (12/02/16 16:14:20)           Narrative:   CLINICAL DATA: Open wound.  EXAM: LEFT FOOT - COMPLETE 3+ VIEW  COMPARISON: 08/30/2009.  FINDINGS: Small radiopacity is noted over the medial left foot. These could represent  foreign bodies. Diffuse soft tissue swelling. No acute or focal bony abnormality identified.  IMPRESSION: Small radiopacity is noted over the medial left foot. These could represent small foreign bodies. Diffuse soft tissue swelling noted. No acute bony or joint abnormality identified.   Electronically Signed By: Maisie Fus Register On: 12/02/2016 16:14          ____________________________________________   PROCEDURES  Procedure(s) performed:   Procedures  Critical Care performed:   ____________________________________________   INITIAL IMPRESSION / ASSESSMENT AND PLAN / ED COURSE  Pertinent labs & imaging results that were available during my care of the patient were reviewed by me and considered in my medical decision making (see chart for details).    Clinical Course   ----------------------------------------- 4:54 PM on 12/02/2016 -----------------------------------------  Patient with cellulitis extending from her left necrotic heel ulcer. Discussed case with Dr. Ether Griffins of podiatry who recommends discussing the case with vascular surgery. [facility]ridge surgery at this time and I'm waiting call back. Patient will be admitted to the hospital with IV antibiotics. Signed out to Dr. Tildon Husky.   ____________________________________________   FINAL CLINICAL IMPRESSION(S) / ED DIAGNOSES  Left lower extremity cellulitis. Necrotic heel ulcer.    NEW MEDICATIONS STARTED DURING THIS VISIT:  New Prescriptions   No medications on file     Note:  This document was prepared using Dragon voice recognition software and may include unintentional dictation errors.    Myrna Blazer, MD 12/02/16 [phone]

## 2016-12-02 NOTE — ED Provider Notes (Signed)
  Discussed the case with Dr. due of vascular surgery who recommends giving the patient nothing by mouth after midnight for possible angiography in the morning.   Myrna Blazeravid Matthew Owynn Mosqueda, MD 12/02/16 [phone]

## 2016-12-02 NOTE — ED Triage Notes (Signed)
Pt from [facility] via EMS, pt presents with ulcer on L foot

## 2016-12-02 NOTE — Progress Notes (Signed)
[facility]  Daily Progress Note   Will plan on left lower extremity angiogram on Thursday with Dr. Wyn Quakerew  Full consult to follow.  Cleda DaubKimberly Brittyn Salaz PA-C 12/02/2016 5:43 PM

## 2016-12-02 NOTE — Consult Note (Signed)
Pharmacy Antibiotic Note  Laurie [name] is a 70 y.o. female admitted on 12/02/2016 with gangrene foot.  Pharmacy has been consulted for cefepime and vancomycin dosing.  Plan: Pt received 1g of vancomycin in the ED. Will give next dose in 6 hours for stacked dosing Vancomycin 750mg  IV every 12hr hours.  Goal trough 15-20 mcg/mL. cefepime 2g q 12hr  Trough prior to the 5th dose 12/14 @ 1230  Height: 5\' 8"  (172.7 cm) Weight: 190 lb (86.2 kg) IBW/kg (Calculated) : 63.9  Temp (24hrs), Avg:97.9 F (36.6 C), Min:97.9 F (36.6 C), Max:97.9 F (36.6 C)   Recent Labs Lab 12/02/16 1539  WBC 9.3  CREATININE 1.13*    Estimated Creatinine Clearance: 54.8 mL/min (by C-G formula based on SCr of 1.13 mg/dL (H)).    Allergies  Allergen Reactions  . Penicillins Hives    Childhood allergy could have been more reaction only remembers hives.     Antimicrobials this admission: vancomycin 12/112 >>  cefepime 12/12 >>   Dose adjustments this admission:   Microbiology results: 12/12 BCx:    Thank you for allowing pharmacy to be a part of this patient's care.  Olene FlossMelissa D Percy Winterrowd, Pharm.D, BCPS Clinical Pharmacist  12/02/2016 8:28 PM

## 2016-12-02 NOTE — Progress Notes (Signed)
Family Meeting Note  Advance Directive:no  Today a meeting took place with the Patient.   The following clinical team members were present during this meeting:MD  The following were discussed:Patient's diagnosis: Diabetic foot ulcer with gangrene, Patient's progosis: Unable to determine and Goals for treatment: Full Code  Additional follow-up to be provided: Needs advanced directives and palliative care at discharge  Time spent during discussion:16 minutes  Alizza Sacra, MD

## 2016-12-02 NOTE — H&P (Signed)
[facility] [place] [facility]   PATIENT NAME: Laurie [name]    MR#:  [id number]  DATE OF BIRTH:  22-Feb-1946  DATE OF ADMISSION:  12/02/2016  PRIMARY CARE PHYSICIAN: Lonia Blood, MD   REQUESTING/REFERRING PHYSICIAN: sr shaevitz  CHIEF COMPLAINT:   Left heel ulcer HISTORY OF PRESENT ILLNESS:  Laurie [name]  is a 70 y.o. female with a known history of PAD status post left femoral to posterior tibial bypass, Diabetes right BKA, CVA and essential hypertension who presents from [facility] healthcare with large left heel decubitus ulcer with purulent discharge and foul smelling. She was last seen by vascular surgery on December 7 and wound care on December 8. When she saw her vascular surgeon on December 7 plan of care was to have conservative measures due to her current state of debility she was deemed not a candidate for further revascularization and plans to offload the heel with hopes that it would heal with time. Duplex ultrasound today showed a patent left femoral to posterior tibial artery bypass graft with monophasic waveforms ABI of 1.01. 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 She is started on cefepime and vancomycin in the emergency room.   PAST MEDICAL HISTORY:   Past Medical History:  Diagnosis Date  . Anemia   . Anxiety   . Bacteremia   . Bursitis    "left hip; sometimes left shoulder" (03/05/2015)  . Chest pain    Troponins up to 0.12 while hospitalized for E.coli PNA  . Depression   . Family history of adverse reaction to anesthesia    "brother died 2013-07-16; woke up w/headache, vomited blood; drilled holes in his head; bleeding stroke; never came back" (03/05/2015)  . Headache    "when someone gets on my nerves" (03/05/2015)  . High cholesterol   . Hypertension   . Narcolepsy   . 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 Stroke [facility]) 10/2014   "weaker on left side since" (03/05/2015)  . Type II diabetes mellitus (HCC)   . Walking pneumonia 04/2009   "double" /notes 04/23/2011    PAST SURGICAL HISTORY:   Past Surgical History:  Procedure Laterality Date  . ABDOMINAL AORTAGRAM N/A 03/05/2015   Procedure: ABDOMINAL Ronny Flurry;  Surgeon: Chuck Hint, MD;  Location: [facility] CATH LAB;  Service: Cardiovascular;  Laterality: N/A;  . AMPUTATION Right 11/03/2014   Procedure: AMPUTATION BELOW KNEE;  Surgeon: Sherren Kerns, MD;  Location: [facility] OR;  Service: Vascular;  Laterality: Right;  . FEMORAL-TIBIAL BYPASS GRAFT Left 03/06/2015   Procedure: BYPASS GRAFT Left FEMORAL-POSTERIOR TIBIAL ARTERY with reversed saphenous vein;  Surgeon: Sherren Kerns, MD;  Location: [facility] OR;  Service: Vascular;  Laterality: Left;  . INTRAOPERATIVE ARTERIOGRAM Left 03/06/2015   Procedure: INTRA OPERATIVE ARTERIOGRAM;  Surgeon: Sherren Kerns, MD;  Location: [facility] OR;  Service: Vascular;  Laterality: Left;  . LOWER EXTREMITY ANGIOGRAM N/A 10/03/2014   Procedure: LOWER EXTREMITY ANGIOGRAM;  Surgeon: Pamella Pert, MD;  Location: [facility] CATH LAB;  Service: Cardiovascular;  Laterality: N/A;    SOCIAL HISTORY:   Social History  Substance Use Topics  . Smoking status: Current Every Day Smoker    Packs/day: 0.10    Years: 40.00    Types: Cigarettes  . Smokeless tobacco: Never Used  . Alcohol use 0.0 oz/week  Comment: 03/05/2015 "Might have a drink on holidays"    FAMILY HISTORY:   Family History  Problem Relation Age of Onset  . CAD Mother   . CAD Father     DRUG ALLERGIES:   Allergies  Allergen Reactions  . Penicillins Hives    Childhood allergy could have been more reaction only remembers hives.     REVIEW OF SYSTEMS:   Review of Systems  Constitutional: Negative.  Negative for chills, fever and malaise/fatigue.  HENT: Negative.  Negative for ear discharge, ear pain, hearing loss,  nosebleeds and sore throat.   Eyes: Negative.  Negative for blurred vision and pain.  Respiratory: Negative.  Negative for cough, hemoptysis, shortness of breath and wheezing.   Cardiovascular: Positive for leg swelling. Negative for chest pain and palpitations.  Gastrointestinal: Negative.  Negative for abdominal pain, blood in stool, diarrhea, nausea and vomiting.  Genitourinary: Negative.  Negative for dysuria.  Musculoskeletal: Negative.  Negative for back pain.       Right AKA  Skin: Negative.        Large left heel ulcer foul-smelling with purulent discharge and necrotic  Neurological: Negative for dizziness, tremors, speech change, focal weakness, seizures and headaches.  Endo/Heme/Allergies: Negative.  Does not bruise/bleed easily.  Psychiatric/Behavioral: Negative.  Negative for depression, hallucinations and suicidal ideas.    MEDICATIONS AT HOME:   Prior to Admission medications   Medication Sig Start Date End Date Taking? Authorizing Provider  atorvastatin (LIPITOR) 10 MG tablet Take 1 tablet (10 mg total) by mouth daily at 6 PM. 11/04/13  Yes Evlyn KannerPamela S Love, PA-C  clopidogrel (PLAVIX) 75 MG tablet Take 75 mg by mouth daily.   Yes Historical Provider, MD  ALPRAZolam Prudy Feeler(XANAX) 0.25 MG tablet Take one tablet by mouth twice daily as needed for anxiety Patient taking differently: Take 0.25 mg by mouth 2 (two) times daily. Take one tablet by mouth twice daily as needed for anxiety 11/10/14   Tiffany L Reed, DO  amphetamine-dextroamphetamine (ADDERALL) 10 MG tablet Take 10 mg by mouth 2 (two) times daily with a meal. Reported on 07/02/2016    Historical Provider, MD  aspirin EC 81 MG tablet Take 81 mg by mouth daily.    Historical Provider, MD  Cholecalciferol (VITAMIN D3 PO) Take 1 tablet by mouth daily.    Historical Provider, MD  citalopram (CELEXA) 10 MG tablet Take 10 mg by mouth daily.    Historical Provider, MD  Coenzyme Q10 (COQ10 PO) Take 1 capsule by mouth daily.    Historical  Provider, MD  diclofenac sodium (VOLTAREN) 1 % GEL Apply 4 g topically 4 (four) times daily. 06/20/16   April Palumbo, MD  DULoxetine (CYMBALTA) 60 MG capsule Take 60 mg by mouth daily.    Historical Provider, MD  gabapentin (NEURONTIN) 300 MG capsule Take 1 capsule (300 mg total) by mouth 2 (two) times daily. Patient taking differently: Take 300 mg by mouth 3 (three) times daily.  03/13/14   Erick ColaceAndrew E Kirsteins, MD  hydrochlorothiazide (MICROZIDE) 12.5 MG capsule Take 1 capsule (12.5 mg total) by mouth daily. 11/07/14   Jeralyn BennettEzequiel Zamora, MD  HYDROcodone-acetaminophen (NORCO/VICODIN) 5-325 MG per tablet Take one tablet by mouth every 6 hours as needed for pain 11/21/14   Oneal GroutMahima Pandey, MD  insulin glargine (LANTUS) 100 UNIT/ML injection Inject 0.2 mLs (20 Units total) into the skin at bedtime. 11/07/14   Jeralyn BennettEzequiel Zamora, MD  lisinopril (PRINIVIL,ZESTRIL) 20 MG tablet Take 1 tablet (20 mg total) by mouth daily.  11/07/14   Jeralyn BennettEzequiel Zamora, MD  modafinil (PROVIGIL) 100 MG tablet Take 1 tablet (100 mg total) by mouth daily. 07/02/16   Huston FoleySaima Athar, MD  oxyCODONE-acetaminophen (PERCOCET/ROXICET) 5-325 MG per tablet Take 1-2 tablets by mouth every 6 (six) hours as needed for severe pain. 03/10/15   Raymond GurneyKimberly A Trinh, PA-C  SANTYL ointment Apply 1 application topically daily.  10/26/14   Historical Provider, MD  silver sulfADIAZINE (SILVADENE) 1 % cream Apply 1 application topically daily. Patient not taking: Reported on 11/27/2016 01/30/15   Vivi BarrackMatthew R Wagoner, DPM  sulfamethoxazole-trimethoprim (SEPTRA DS) 800-160 MG per tablet Take 1 tablet by mouth 2 (two) times daily. 02/23/15   Heather Laisure, PA-C      VITAL SIGNS:  Blood pressure 103/66, pulse 85, temperature 97.9 F (36.6 C), height 5\' 8"  (1.727 m), weight 86.2 kg (190 lb), SpO2 96 %.  PHYSICAL EXAMINATION:   Physical Exam  Constitutional: She is oriented to person, place, and time and well-developed, well-nourished, and in no distress. No distress.   HENT:  Head: Normocephalic.  Eyes: No scleral icterus.  Neck: Normal range of motion. Neck supple. No JVD present. No tracheal deviation present.  Cardiovascular: Normal rate, regular rhythm and normal heart sounds.  Exam reveals no gallop and no friction rub.   No murmur heard. Pulmonary/Chest: Effort normal and breath sounds normal. No respiratory distress. She has no wheezes. She has no rales. She exhibits no tenderness.  Abdominal: Soft. Bowel sounds are normal. She exhibits no distension and no mass. There is no tenderness. There is no rebound and no guarding.  Musculoskeletal: Normal range of motion. She exhibits edema.  Right BKA  Neurological: She is alert and oriented to person, place, and time.  Skin: Skin is warm. No rash noted. No erythema.  Large left heel ulcer which is necrotic   Psychiatric: Affect and judgment normal.      LABORATORY PANEL:   CBC  Recent Labs Lab 12/02/16 1539  WBC 9.3  HGB 11.5*  HCT 34.4*  PLT 283   ------------------------------------------------------------------------------------------------------------------  Chemistries   Recent Labs Lab 12/02/16 1539  NA 129*  K 3.7  CL 88*  CO2 31  GLUCOSE 584*  BUN 23*  CREATININE 1.13*  CALCIUM 8.6*   ------------------------------------------------------------------------------------------------------------------  Cardiac Enzymes No results for input(s): TROPONINI in the last 168 hours. ------------------------------------------------------------------------------------------------------------------  RADIOLOGY:  Dg Foot Complete Left  Result Date: 12/02/2016 CLINICAL DATA:  Open wound. EXAM: LEFT FOOT - COMPLETE 3+ VIEW COMPARISON:  08/30/2009. FINDINGS: Small radiopacity is noted over the medial left foot. These could represent foreign bodies. Diffuse soft tissue swelling. No acute or focal bony abnormality identified. IMPRESSION: Small radiopacity is noted over the medial left  foot. These could represent small foreign bodies. Diffuse soft tissue swelling noted. No acute bony or joint abnormality identified. Electronically Signed   By: Maisie Fushomas  Register   On: 12/02/2016 16:14    EKG:     IMPRESSION AND PLAN:    70 year old female with history of PAD recently seen by her vascular surgeon for left decubitus/necrotic heel ulcer who presents with foul smelling and painful left decubitus ulcer.  1. Diabetic left heel decubitus ulcer likely requiring angiogram and failed outpatient treatment on Bactrim Vascular surgery consulted Continue cefepime and vancomycin Wound care consult  2. Uncontrolled Diabetes insulin dependent worsened from infection: Continue IV fluids, sliding scale insulin Requested diabetes coordinator consult Continue outpatient diabetic regimen  3. Essential hypertension: Continue lisinopril 4. PAD on Plavix   5. Hyperlipidemia  on atorvastatin    All the records are reviewed and case discussed with ED provider. Management plans discussed with the patient and she is in agreement  CODE STATUS: full  TOTAL TIME TAKING CARE OF THIS PATIENT: 45 minutes.    Nasiah Lehenbauer M.D on 12/02/2016 at 5:19 PM  Between 7am to 6pm - Pager - [phone]  After 6pm go to [email or web address] - Social research officer, government  [facility]  Office  [phone]  CC: Primary care physician; Lonia Blood, MD

## 2016-12-03 ENCOUNTER — Encounter
Admission: EM | Disposition: A | Discharge status: Skilled Nursing Facility | Payer: Self-pay | Source: Home / Self Care | Attending: Internal Medicine

## 2016-12-03 DIAGNOSIS — L8990 Pressure ulcer of unspecified site, unspecified stage: Secondary | ICD-10-CM | POA: Insufficient documentation

## 2016-12-03 DIAGNOSIS — L97429 Non-pressure chronic ulcer of left heel and midfoot with unspecified severity: Secondary | ICD-10-CM

## 2016-12-03 DIAGNOSIS — I70261 Atherosclerosis of native arteries of extremities with gangrene, right leg: Secondary | ICD-10-CM

## 2016-12-03 DIAGNOSIS — I70248 Atherosclerosis of native arteries of left leg with ulceration of other part of lower left leg: Secondary | ICD-10-CM

## 2016-12-03 DIAGNOSIS — E119 Type 2 diabetes mellitus without complications: Secondary | ICD-10-CM

## 2016-12-03 HISTORY — PX: PERIPHERAL VASCULAR CATHETERIZATION: SHX172C

## 2016-12-03 LAB — GLUCOSE, CAPILLARY
GLUCOSE-CAPILLARY: 289 mg/dL — AB (ref 65–99)
GLUCOSE-CAPILLARY: 151 mg/dL — AB (ref 65–99)
GLUCOSE-CAPILLARY: 193 mg/dL — AB (ref 65–99)
Glucose-Capillary: 188 mg/dL — ABNORMAL HIGH (ref 65–99)
Glucose-Capillary: 175 mg/dL — ABNORMAL HIGH (ref 65–99)

## 2016-12-03 LAB — CBC
HCT: 31.7 % — ABNORMAL LOW (ref 35.0–47.0)
Hemoglobin: 11 g/dL — ABNORMAL LOW (ref 12.0–16.0)
MCH: 31.6 pg (ref 26.0–34.0)
MCHC: 34.5 g/dL (ref 32.0–36.0)
MCV: 91.6 fL (ref 80.0–100.0)
Platelets: 275 K/uL (ref 150–440)
RBC: 3.47 MIL/uL — ABNORMAL LOW (ref 3.80–5.20)
RDW: 12 % (ref 11.5–14.5)
WBC: 9.3 K/uL (ref 3.6–11.0)

## 2016-12-03 LAB — BLOOD CULTURE ID PANEL (REFLEXED)
Acinetobacter baumannii: NOT DETECTED
CANDIDA GLABRATA: NOT DETECTED
CANDIDA KRUSEI: NOT DETECTED
Candida albicans: NOT DETECTED
Candida parapsilosis: NOT DETECTED
Candida tropicalis: NOT DETECTED
ENTEROBACTER CLOACAE COMPLEX: NOT DETECTED
ESCHERICHIA COLI: NOT DETECTED
Enterobacteriaceae species: NOT DETECTED
Enterococcus species: NOT DETECTED
Haemophilus influenzae: NOT DETECTED
KLEBSIELLA OXYTOCA: NOT DETECTED
Klebsiella pneumoniae: NOT DETECTED
Listeria monocytogenes: NOT DETECTED
Methicillin resistance: NOT DETECTED
Neisseria meningitidis: NOT DETECTED
PROTEUS SPECIES: NOT DETECTED
Pseudomonas aeruginosa: NOT DETECTED
SERRATIA MARCESCENS: NOT DETECTED
STAPHYLOCOCCUS AUREUS BCID: NOT DETECTED
STAPHYLOCOCCUS SPECIES: DETECTED — AB
STREPTOCOCCUS AGALACTIAE: NOT DETECTED
STREPTOCOCCUS PNEUMONIAE: NOT DETECTED
Streptococcus pyogenes: NOT DETECTED
Streptococcus species: NOT DETECTED

## 2016-12-03 LAB — BASIC METABOLIC PANEL
ANION GAP: 8 (ref 5–15)
BUN: 20 mg/dL (ref 6–20)
CALCIUM: 8.2 mg/dL — AB (ref 8.9–10.3)
CO2: 31 mmol/L (ref 22–32)
Chloride: 96 mmol/L — ABNORMAL LOW (ref 101–111)
Creatinine, Ser: 0.91 mg/dL (ref 0.44–1.00)
Glucose, Bld: 352 mg/dL — ABNORMAL HIGH (ref 65–99)
POTASSIUM: 3.1 mmol/L — AB (ref 3.5–5.1)
Sodium: 135 mmol/L (ref 135–145)

## 2016-12-03 LAB — MRSA PCR SCREENING: MRSA by PCR: NEGATIVE

## 2016-12-03 LAB — SEDIMENTATION RATE: Sed Rate: 75 mm/hr — ABNORMAL HIGH (ref 0–30)

## 2016-12-03 LAB — MAGNESIUM: Magnesium: 1.6 mg/dL — ABNORMAL LOW (ref 1.7–2.4)

## 2016-12-03 SURGERY — LOWER EXTREMITY ANGIOGRAPHY
Anesthesia: Moderate Sedation | Laterality: Left

## 2016-12-03 MED ORDER — SODIUM CHLORIDE 0.9 % IJ SOLN
INTRAMUSCULAR | Status: AC
  Filled 2016-12-03: qty 50

## 2016-12-03 MED ORDER — TIROFIBAN HCL IN NACL 5-0.9 MG/100ML-% IV SOLN
0.1500 ug/kg/min | INTRAVENOUS | Status: AC
  Administered 2016-12-04: 0.15 ug/kg/min via INTRAVENOUS
  Filled 2016-12-03 (×2): qty 100

## 2016-12-03 MED ORDER — LIDOCAINE-EPINEPHRINE 1 %-1:100000 IJ SOLN
INTRAMUSCULAR | Status: AC
  Filled 2016-12-03: qty 1

## 2016-12-03 MED ORDER — FENTANYL CITRATE (PF) 100 MCG/2ML IJ SOLN
INTRAMUSCULAR | Status: AC
  Filled 2016-12-03: qty 2

## 2016-12-03 MED ORDER — HEPARIN SODIUM (PORCINE) 1000 UNIT/ML IJ SOLN
INTRAMUSCULAR | Status: AC
  Filled 2016-12-03: qty 1

## 2016-12-03 MED ORDER — MORPHINE SULFATE (PF) 2 MG/ML IV SOLN
2.0000 mg | INTRAVENOUS | Status: DC | PRN
  Administered 2016-12-04 – 2016-12-07 (×2): 2 mg via INTRAVENOUS
  Filled 2016-12-03 (×4): qty 1

## 2016-12-03 MED ORDER — HYDROCODONE-ACETAMINOPHEN 5-325 MG PO TABS
ORAL_TABLET | ORAL | Status: AC
  Filled 2016-12-03: qty 2

## 2016-12-03 MED ORDER — INSULIN GLARGINE 100 UNIT/ML ~~LOC~~ SOLN
26.0000 [IU] | Freq: Every day | SUBCUTANEOUS | Status: DC
Start: 2016-12-03 — End: 2016-12-06
  Administered 2016-12-03 – 2016-12-05 (×3): 26 [IU] via SUBCUTANEOUS
  Filled 2016-12-03 (×4): qty 0.26

## 2016-12-03 MED ORDER — NYSTATIN 100000 UNIT/GM EX POWD
Freq: Two times a day (BID) | CUTANEOUS | Status: DC
  Administered 2016-12-03 – 2016-12-07 (×10): via TOPICAL
  Filled 2016-12-03: qty 15

## 2016-12-03 MED ORDER — TIROFIBAN HCL IV 12.5 MG/250 ML
INTRAVENOUS | Status: AC
  Administered 2016-12-03: 2167.5 ug via INTRAVENOUS
  Filled 2016-12-03: qty 250

## 2016-12-03 MED ORDER — CLINDAMYCIN PHOSPHATE 300 MG/50ML IV SOLN
300.0000 mg | Freq: Once | INTRAVENOUS | Status: AC
  Administered 2016-12-03: 300 mg via INTRAVENOUS
  Filled 2016-12-03: qty 50

## 2016-12-03 MED ORDER — CLOPIDOGREL BISULFATE 75 MG PO TABS
75.0000 mg | ORAL_TABLET | Freq: Every day | ORAL | Status: DC
  Administered 2016-12-04 – 2016-12-08 (×4): 75 mg via ORAL
  Filled 2016-12-03 (×4): qty 1

## 2016-12-03 MED ORDER — TIROFIBAN (AGGRASTAT) BOLUS VIA INFUSION
25.0000 ug/kg | Freq: Once | INTRAVENOUS | Status: AC
  Administered 2016-12-03: 2167.5 ug via INTRAVENOUS

## 2016-12-03 MED ORDER — SODIUM CHLORIDE 0.9% FLUSH
3.0000 mL | INTRAVENOUS | Status: DC | PRN
  Administered 2016-12-04: 3 mL via INTRAVENOUS
  Filled 2016-12-03: qty 3

## 2016-12-03 MED ORDER — NITROGLYCERIN 1 MG/10 ML FOR IR/CATH LAB
INTRA_ARTERIAL | Status: DC | PRN
  Administered 2016-12-03 (×2): 500 ug

## 2016-12-03 MED ORDER — CLONIDINE HCL 0.1 MG PO TABS: 0.1000 mg | ORAL_TABLET | Freq: Three times a day (TID) | ORAL | Status: DC

## 2016-12-03 MED ORDER — NITROGLYCERIN 5 MG/ML IV SOLN
INTRAVENOUS | Status: AC
  Filled 2016-12-03: qty 10

## 2016-12-03 MED ORDER — SODIUM CHLORIDE 0.9% FLUSH
3.0000 mL | Freq: Two times a day (BID) | INTRAVENOUS | Status: DC
  Administered 2016-12-03 – 2016-12-07 (×7): 3 mL via INTRAVENOUS

## 2016-12-03 MED ORDER — FENTANYL CITRATE (PF) 100 MCG/2ML IJ SOLN
INTRAMUSCULAR | Status: DC | PRN
  Administered 2016-12-03 (×2): 25 ug via INTRAVENOUS
  Administered 2016-12-03: 50 ug via INTRAVENOUS
  Administered 2016-12-03: 25 ug via INTRAVENOUS

## 2016-12-03 MED ORDER — CLONIDINE HCL 0.1 MG PO TABS
0.1000 mg | ORAL_TABLET | Freq: Two times a day (BID) | ORAL | Status: DC
  Administered 2016-12-04 – 2016-12-08 (×9): 0.1 mg via ORAL
  Filled 2016-12-03 (×9): qty 1

## 2016-12-03 MED ORDER — MIDAZOLAM HCL 2 MG/2ML IJ SOLN
INTRAMUSCULAR | Status: DC | PRN
  Administered 2016-12-03 (×4): 1 mg via INTRAVENOUS

## 2016-12-03 MED ORDER — HEPARIN SODIUM (PORCINE) 1000 UNIT/ML IJ SOLN
INTRAMUSCULAR | Status: DC | PRN
  Administered 2016-12-03: 4000 [IU] via INTRAVENOUS

## 2016-12-03 MED ORDER — MIDAZOLAM HCL 2 MG/2ML IJ SOLN
INTRAMUSCULAR | Status: AC
  Filled 2016-12-03: qty 4

## 2016-12-03 MED ORDER — POTASSIUM CHLORIDE CRYS ER 20 MEQ PO TBCR
40.0000 meq | EXTENDED_RELEASE_TABLET | Freq: Once | ORAL | Status: AC
  Administered 2016-12-03: 40 meq via ORAL
  Filled 2016-12-03: qty 2

## 2016-12-03 MED ORDER — IOPAMIDOL (ISOVUE-300) INJECTION 61%
INTRAVENOUS | Status: DC | PRN
  Administered 2016-12-03: 70 mL via INTRA_ARTERIAL

## 2016-12-03 MED ORDER — GABAPENTIN 300 MG PO CAPS
300.0000 mg | ORAL_CAPSULE | Freq: Two times a day (BID) | ORAL | Status: DC
  Administered 2016-12-03 – 2016-12-08 (×10): 300 mg via ORAL
  Filled 2016-12-03 (×10): qty 1

## 2016-12-03 SURGICAL SUPPLY — 16 items
BALLN ULTRVRSE 3X100X150 (BALLOONS) ×3
BALLOON ULTRVRSE 3X100X150 (BALLOONS) IMPLANT
CATH CXI SUPP ANG 4FR 135 (MICROCATHETER) IMPLANT
CATH CXI SUPP ANG 4FR 135CM (MICROCATHETER) ×3
CATH PIG 70CM (CATHETERS) ×3 IMPLANT
CATH VERT 100CM (CATHETERS) ×2 IMPLANT
DEVICE PRESTO INFLATION (MISCELLANEOUS) ×2 IMPLANT
DEVICE STARCLOSE SE CLOSURE (Vascular Products) ×2 IMPLANT
GLIDEWIRE ADV .035X260CM (WIRE) ×2 IMPLANT
PACK ANGIOGRAPHY (CUSTOM PROCEDURE TRAY) ×3 IMPLANT
SHEATH ANL2 6FRX45 HC (SHEATH) ×2 IMPLANT
SHEATH BRITE TIP 5FRX11 (SHEATH) ×3 IMPLANT
SYR MEDRAD MARK V 150ML (SYRINGE) ×3 IMPLANT
TUBING CONTRAST HIGH PRESS 72 (TUBING) ×3 IMPLANT
WIRE G V18X300CM (WIRE) ×2 IMPLANT
WIRE J 3MM .035X145CM (WIRE) ×3 IMPLANT

## 2016-12-03 NOTE — OR Nursing (Signed)
No additional antibiotic during procedure per dr dew

## 2016-12-03 NOTE — Progress Notes (Signed)
Pt remains clinically stable post angiogram with intervention, right groin without bleeding nor hematoma, pt alert and oriented. Dr Wyn Quakerew out to speak with pt with questions answered sr per monitor, AggraStat bolus and gtt started per orders.denies complaints

## 2016-12-03 NOTE — Progress Notes (Addendum)
New Admit  Arrival Method: From ED Mental Orientation: Alert and oriented X4 Assessment: clear lungs. No adventitious heart sounds. Skin: Left heel ulcer with black eschar. Heel elevated on pillows. Right BKA. Sacral foam applied. Iv: 20 G Left hand Pain: 8/10 in Left heel, Medication given see MAR Safety Measures: Bed alarm on.  Admission: Complete Family: Updated by patient  Patient has been NPO since midnight for Angiogram

## 2016-12-03 NOTE — Consult Note (Signed)
ORTHOPAEDIC CONSULTATION  REQUESTING PHYSICIAN: Alford Highlandichard Wieting, MD  Chief Complaint: Left heel ulceration  HPI: Laurie [name] is a 70 y.o. female who complains of  Ulcer left heel. Followed by vascular and wound care.  Noted worsening drainage to left heel and admitted.  Vascular has performed revascularization to left leg today.    Past Medical History:  Diagnosis Date  . Anemia   . Anxiety   . Bacteremia   . Bursitis    "left hip; sometimes left shoulder" (03/05/2015)  . Chest pain    Troponins up to 0.12 while hospitalized for E.coli PNA  . Depression   . Family history of adverse reaction to anesthesia    "brother died 06/24/2013; woke up w/headache, vomited blood; drilled holes in his head; bleeding stroke; never came back" (03/05/2015)  . Headache    "when someone gets on my nerves" (03/05/2015)  . High cholesterol   . Hypertension   . Narcolepsy   . 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 Stroke [facility](HCC) 10/2014   "weaker on left side since" (03/05/2015)  . Type II diabetes mellitus (HCC)   . Walking pneumonia 04/2009   "double" /notes 04/23/2011   Past Surgical History:  Procedure Laterality Date  . ABDOMINAL AORTAGRAM N/A 03/05/2015   Procedure: ABDOMINAL Ronny FlurryAORTAGRAM;  Surgeon: Chuck Hinthristopher S Dickson, MD;  [facility];  Service: Cardiovascular;  Laterality: N/A;  . AMPUTATION Right 11/03/2014   Procedure: AMPUTATION BELOW KNEE;  Surgeon: Sherren Kernsharles E Fields, MD;  [facility];  Service: Vascular;  Laterality: Right;  . FEMORAL-TIBIAL BYPASS GRAFT Left 03/06/2015   Procedure: BYPASS GRAFT Left FEMORAL-POSTERIOR TIBIAL ARTERY with reversed saphenous vein;  Surgeon: Sherren Kernsharles E Fields, MD;  [facility];  Service: Vascular;  Laterality: Left;  . INTRAOPERATIVE ARTERIOGRAM Left 03/06/2015   Procedure: INTRA OPERATIVE ARTERIOGRAM;  Surgeon: Sherren Kernsharles E Fields, MD;  [facility];  Service: Vascular;  Laterality: Left;  . LOWER EXTREMITY ANGIOGRAM N/A  10/03/2014   Procedure: LOWER EXTREMITY ANGIOGRAM;  Surgeon: Pamella PertJagadeesh R Ganji, MD;  [facility];  Service: Cardiovascular;  Laterality: N/A;   Social History   Social History  . Marital status: Divorced    Spouse name: N/A  . Number of children: N/A  . Years of education: N/A   Social History Main Topics  . Smoking status: Former Smoker    Packs/day: 0.10    Years: 40.00    Types: Cigarettes  . Smokeless tobacco: Never Used     Comment: pt just quit 1 month ago   . Alcohol use 0.0 oz/week     Comment: 03/05/2015 "Might have a drink on holidays"  . Drug use: No  . Sexual activity: Not Currently   Other Topics Concern  . None   Social History Narrative   She lives with her sister and they do not have a good relationship.   Family History  Problem Relation Age of Onset  . CAD Mother   . CAD Father    Allergies  Allergen Reactions  . Penicillins Hives    Childhood allergy could have been more reaction only remembers hives.    Prior to Admission medications   Medication Sig Start Date End Date Taking? Authorizing Provider  ALPRAZolam Prudy Feeler(XANAX) 0.25 MG tablet Take one tablet by mouth twice daily as needed for anxiety Patient taking differently: Take 0.25 mg by mouth 2 (two) times daily. Take one tablet by mouth twice daily as  needed for anxiety 11/10/14  Yes Tiffany L Reed, DO  amphetamine-dextroamphetamine (ADDERALL) 10 MG tablet Take 10 mg by mouth 2 (two) times daily with a meal. Reported on 07/02/2016   Yes Historical Provider, MD  atorvastatin (LIPITOR) 10 MG tablet Take 1 tablet (10 mg total) by mouth daily at 6 PM. 11/04/13  Yes Evlyn KannerPamela S Love, PA-C  cloNIDine (CATAPRES) 0.2 MG tablet Take 0.2 mg by mouth 3 (three) times daily.   Yes Historical Provider, MD  clopidogrel (PLAVIX) 75 MG tablet Take 75 mg by mouth daily.   Yes Historical Provider, MD  diclofenac sodium (VOLTAREN) 1 % GEL Apply 4 g topically 4 (four) times daily. 06/20/16  Yes April Palumbo, MD   DULoxetine (CYMBALTA) 60 MG capsule Take 60 mg by mouth daily.   Yes Historical Provider, MD  gabapentin (NEURONTIN) 300 MG capsule Take 1 capsule (300 mg total) by mouth 2 (two) times daily. 03/13/14  Yes Erick ColaceAndrew E Kirsteins, MD  insulin glargine (LANTUS) 100 UNIT/ML injection Inject 0.2 mLs (20 Units total) into the skin at bedtime. 11/07/14  Yes Jeralyn BennettEzequiel Zamora, MD  lisinopril (PRINIVIL,ZESTRIL) 20 MG tablet Take 1 tablet (20 mg total) by mouth daily. 11/07/14  Yes Jeralyn BennettEzequiel Zamora, MD  oxybutynin (DITROPAN-XL) 5 MG 24 hr tablet Take 5 mg by mouth at bedtime.   Yes Historical Provider, MD  oxyCODONE-acetaminophen (PERCOCET/ROXICET) 5-325 MG per tablet Take 1-2 tablets by mouth every 6 (six) hours as needed for severe pain. Patient taking differently: Take 1-2 tablets by mouth 2 (two) times daily.  03/10/15  Yes Raymond GurneyKimberly A Trinh, PA-C  torsemide (DEMADEX) 100 MG tablet Take 100 mg by mouth daily.   Yes Historical Provider, MD   Dg Foot Complete Left  Result Date: 12/02/2016 CLINICAL DATA:  Open wound. EXAM: LEFT FOOT - COMPLETE 3+ VIEW COMPARISON:  08/30/2009. FINDINGS: Small radiopacity is noted over the medial left foot. These could represent foreign bodies. Diffuse soft tissue swelling. No acute or focal bony abnormality identified. IMPRESSION: Small radiopacity is noted over the medial left foot. These could represent small foreign bodies. Diffuse soft tissue swelling noted. No acute bony or joint abnormality identified. Electronically Signed   By: Maisie Fushomas  Register   On: 12/02/2016 16:14    Positive ROS: All other systems have been reviewed and were otherwise negative with the exception of those mentioned in the HPI and as above.  12 point ROS was performed.  Physical Exam: General: Alert and oriented.  No apparent distress.  Vascular:  Left foot:Dorsalis Pedis:  absent Posterior Tibial:  absent  Right foot: BKA right side.  Neuro:absent sensation  Derm:Very large necrotic decubitus  heel ulceration with drainage. Very difficult to assess as pt in flexion contracture to left le  Ortho/MS: Flexion contracture to left lower leg.  Diffuse edema to lower leg. BKA right side   Assessment: Large posterior heel decubitus ulceration.  Plan: Pt is very high risk of amputation left side.  Large necrotic heel ulcer with minimal chance of salvage.  Will order MRI to evaluate for osteomyelitis.  Will d/w vascular surgery tomorrow.  Padded dressing with pressure reducing boots for now.      Irean HongFowler, Carolos Fecher A, DPM Cell [phone]   12/03/2016 6:35 PM
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
## 2016-12-03 NOTE — Progress Notes (Signed)
PHARMACY - PHYSICIAN COMMUNICATION CRITICAL VALUE ALERT - BLOOD CULTURE IDENTIFICATION (BCID)  Results for orders placed or performed during the hospital encounter of 12/02/16  Blood Culture ID Panel (Reflexed) (Collected: 12/02/2016  3:39 PM)  Result Value Ref Range   Enterococcus species NOT DETECTED NOT DETECTED   Listeria monocytogenes NOT DETECTED NOT DETECTED   Staphylococcus species DETECTED (A) NOT DETECTED   Staphylococcus aureus NOT DETECTED NOT DETECTED   Methicillin resistance NOT DETECTED NOT DETECTED   Streptococcus species NOT DETECTED NOT DETECTED   Streptococcus agalactiae NOT DETECTED NOT DETECTED   Streptococcus pneumoniae NOT DETECTED NOT DETECTED   Streptococcus pyogenes NOT DETECTED NOT DETECTED   Acinetobacter baumannii NOT DETECTED NOT DETECTED   Enterobacteriaceae species NOT DETECTED NOT DETECTED   Enterobacter cloacae complex NOT DETECTED NOT DETECTED   Escherichia coli NOT DETECTED NOT DETECTED   Klebsiella oxytoca NOT DETECTED NOT DETECTED   Klebsiella pneumoniae NOT DETECTED NOT DETECTED   Proteus species NOT DETECTED NOT DETECTED   Serratia marcescens NOT DETECTED NOT DETECTED   Haemophilus influenzae NOT DETECTED NOT DETECTED   Neisseria meningitidis NOT DETECTED NOT DETECTED   Pseudomonas aeruginosa NOT DETECTED NOT DETECTED   Candida albicans NOT DETECTED NOT DETECTED   Candida glabrata NOT DETECTED NOT DETECTED   Candida krusei NOT DETECTED NOT DETECTED   Candida parapsilosis NOT DETECTED NOT DETECTED   Candida tropicalis NOT DETECTED NOT DETECTED    Name of physician (or Provider) Contacted: text page sent to Dr. Renae GlossWieting  Changes to prescribed antibiotics required: None- could be a a contaminate as only 1/4 bottles, pt already on vancomycin and cefepime for gangrene of foot.  Olene FlossMelissa D Jenisse Vullo, Pharm.D, BCPS Clinical Pharmacist  12/03/2016  4:54 PM

## 2016-12-03 NOTE — H&P (Signed)
[facility] History & Physical Update  The patient was interviewed and re-examined.  The patient's previous History and Physical has been reviewed and is unchanged.  There is no change in the plan of care. We plan to proceed with the scheduled procedure.  Festus BarrenJason Sharalee Witman, MD  12/03/2016, 4:10 PM

## 2016-12-03 NOTE — Consult Note (Signed)
[facility] Vascular Consult Note  MRN : [id number]  Laurie [name] is a 70 y.o. ([birth year]) female who presents with chief complaint of  Chief Complaint  Patient presents with  . Skin Ulcer   History of Present Illness:  Laurie [name]  is a 70 year old female with a known history of PAD status post left femoral to posterior tibial bypass, diabetes, s/p right BKA, CVA and essential hypertension who presented from [facility] to the [facility] ED with large left heel decubitus ulcer. Patient reports an increase in purulent discharge and worsening foul smell. She was last seen by [facility] on 11/27/16 and wound care on 11/28/16. Patient states her vascular surgeon's plan of care was to have "conservative measures" due to her current state of debility. States she was deemed not a candidate for further revascularization. Wound care plan was to offload the heel with hopes that it would heal with time. Patient denies any fever, nausea or vomiting. Right BKA site without issue. Patient is very nervous she is going to lose her left lower extremity.   Consulted by primary team Dr. Juliene Pina for further recommendation regarding possible endovascular intervention.   Current Facility-Administered Medications  Medication Dose Route Frequency Provider Last Rate Last Dose  . 0.9 %  sodium chloride infusion   Intravenous Continuous Adrian Saran, MD 100 mL/hr at 12/02/16 2116    . acetaminophen (TYLENOL) tablet 650 mg  650 mg Oral Q6H PRN Adrian Saran, MD       Or  . acetaminophen (TYLENOL) suppository 650 mg  650 mg Rectal Q6H PRN Adrian Saran, MD      . ALPRAZolam Prudy Feeler) tablet 0.25 mg  0.25 mg Oral BID Adrian Saran, MD   Stopped at 12/03/16 1029  . amphetamine-dextroamphetamine (ADDERALL) tablet 10 mg  10 mg Oral BID WC Adrian Saran, MD      . aspirin EC tablet 81 mg  81 mg Oral Daily Adrian Saran, MD   81 mg at 12/02/16 2116  . atorvastatin (LIPITOR) tablet 10 mg  10 mg Oral  q1800 Sital Mody, MD      . ceFEPIme (MAXIPIME) 2 g in dextrose 5 % 50 mL IVPB  2 g Intravenous Q12H Olene Floss, RPH   2 g at 12/03/16 0620  . citalopram (CELEXA) tablet 10 mg  10 mg Oral Daily Adrian Saran, MD   10 mg at 12/02/16 2114  . DULoxetine (CYMBALTA) DR capsule 60 mg  60 mg Oral Daily Adrian Saran, MD   60 mg at 12/02/16 2113  . enoxaparin (LOVENOX) injection 40 mg  40 mg Subcutaneous Q24H Adrian Saran, MD   40 mg at 12/02/16 2115  . gabapentin (NEURONTIN) capsule 300 mg  300 mg Oral TID Adrian Saran, MD   300 mg at 12/02/16 2113  . hydrochlorothiazide (MICROZIDE) capsule 12.5 mg  12.5 mg Oral Daily Adrian Saran, MD   12.5 mg at 12/02/16 2113  . HYDROcodone-acetaminophen (NORCO/VICODIN) 5-325 MG per tablet 1-2 tablet  1-2 tablet Oral Q4H PRN Adrian Saran, MD      . insulin aspart (novoLOG) injection 0-5 Units  0-5 Units Subcutaneous QHS Adrian Saran, MD   5 Units at 12/02/16 2115  . insulin aspart (novoLOG) injection 0-9 Units  0-9 Units Subcutaneous TID WC Adrian Saran, MD   5 Units at 12/03/16 0900  . insulin glargine (LANTUS) injection 20 Units  20 Units Subcutaneous QHS Adrian Saran, MD   20 Units at 12/02/16 2147  .  ketorolac (TORADOL) 15 MG/ML injection 15 mg  15 mg Intravenous Q6H PRN Sital Mody, MD      . lisinopril (PRINIVIL,ZESTRIL) tablet 20 mg  20 mg Oral Daily Sital Mody, MD      . modafinil (PROVIGIL) tablet 100 mg  100 mg Oral Daily Sital Mody, MD      . ondansetron (ZOFRAN) tablet 4 mg  4 mg Oral Q6H PRN Adrian SaranSital Mody, MD       Or  . ondansetron (ZOFRAN) injection 4 mg  4 mg Intravenous Q6H PRN Adrian SaranSital Mody, MD      . oxyCODONE-acetaminophen (PERCOCET/ROXICET) 5-325 MG per tablet 1-2 tablet  1-2 tablet Oral Q6H PRN Adrian SaranSital Mody, MD   2 tablet at 12/02/16 2111  . potassium chloride SA (K-DUR,KLOR-CON) CR tablet 40 mEq  40 mEq Oral Once Alford Highlandichard Wieting, MD      . senna-docusate (Senokot-S) tablet 1 tablet  1 tablet Oral QHS PRN Adrian SaranSital Mody, MD      . vancomycin (VANCOCIN) IVPB 750 mg/150 ml  premix  750 mg Intravenous Q12H Olene FlossMelissa D Maccia, RPH   750 mg at 12/03/16 0051   Past Medical History:  Diagnosis Date  . Anemia   . Anxiety   . Bacteremia   . Bursitis    "left hip; sometimes left shoulder" (03/05/2015)  . Chest pain    Troponins up to 0.12 while hospitalized for E.coli PNA  . Depression   . Family history of adverse reaction to anesthesia    "brother died 06/24/2013; woke up w/headache, vomited blood; drilled holes in his head; bleeding stroke; never came back" (03/05/2015)  . Headache    "when someone gets on my nerves" (03/05/2015)  . High cholesterol   . Hypertension   . Narcolepsy   . 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 Stroke [facility] 10/2014   "weaker on left side since" (03/05/2015)  . Type II diabetes mellitus (HCC)   . Walking pneumonia 04/2009   "double" /notes 04/23/2011   Past Surgical History:  Procedure Laterality Date  . ABDOMINAL AORTAGRAM N/A 03/05/2015   Procedure: ABDOMINAL Ronny FlurryAORTAGRAM;  Surgeon: Chuck Hinthristopher S Dickson, MD;  [facility];  Service: Cardiovascular;  Laterality: N/A;  . AMPUTATION Right 11/03/2014   Procedure: AMPUTATION BELOW KNEE;  Surgeon: Sherren Kernsharles E Fields, MD;  [facility];  Service: Vascular;  Laterality: Right;  . FEMORAL-TIBIAL BYPASS GRAFT Left 03/06/2015   Procedure: BYPASS GRAFT Left FEMORAL-POSTERIOR TIBIAL ARTERY with reversed saphenous vein;  Surgeon: Sherren Kernsharles E Fields, MD;  [facility];  Service: Vascular;  Laterality: Left;  . INTRAOPERATIVE ARTERIOGRAM Left 03/06/2015   Procedure: INTRA OPERATIVE ARTERIOGRAM;  Surgeon: Sherren Kernsharles E Fields, MD;  [facility];  Service: Vascular;  Laterality: Left;  . LOWER EXTREMITY ANGIOGRAM N/A 10/03/2014   Procedure: LOWER EXTREMITY ANGIOGRAM;  Surgeon: Pamella PertJagadeesh R Ganji, MD;  [facility];  Service: Cardiovascular;  Laterality: N/A;   Social History Social History  Substance Use Topics  . Smoking status: Current Every Day Smoker     Packs/day: 0.10    Years: 40.00    Types: Cigarettes  . Smokeless tobacco: Never Used  . Alcohol use 0.0 oz/week     Comment: 03/05/2015 "Might have a drink on holidays"   Family History Family History  Problem Relation Age of Onset  . CAD Mother   . CAD Father   Denies a family history of PAD, renal disease, clotting or bleeding disorders.  Allergies  Allergen Reactions  . Penicillins Hives    Childhood allergy could have been more reaction only remembers hives.    REVIEW OF SYSTEMS (Negative unless checked)  Constitutional: [] Weight loss  [] Fever  [] Chills Cardiac: [] Chest pain   [] Chest pressure   [] Palpitations   [] Shortness of breath when laying flat   [] Shortness of breath at rest   [] Shortness of breath with exertion. Vascular:  [x] Pain in legs with walking   [x] Pain in legs at rest   [x] Pain in legs when laying flat   [x] Claudication   [x] Pain in feet when walking  [x] Pain in feet at rest  [x] Pain in feet when laying flat   [] History of DVT   [] Phlebitis   [x] Swelling in legs   [] Varicose veins   [x] Non-healing ulcers Pulmonary:   [] Uses home oxygen   [] Productive cough   [] Hemoptysis   [] Wheeze  [] COPD   [] Asthma Neurologic:  [] Dizziness  [] Blackouts   [] Seizures   [] History of stroke   [] History of TIA  [] Aphasia   [] Temporary blindness   [] Dysphagia   [] Weakness or numbness in arms   [] Weakness or numbness in legs Musculoskeletal:  [] Arthritis   [] Joint swelling   [] Joint pain   [] Low back pain Hematologic:  [] Easy bruising  [] Easy bleeding   [] Hypercoagulable state   [] Anemic  [] Hepatitis Gastrointestinal:  [] Blood in stool   [] Vomiting blood  [] Gastroesophageal reflux/heartburn   [] Difficulty swallowing. Genitourinary:  [] Chronic kidney disease   [] Difficult urination  [] Frequent urination  [] Burning with urination   [] Blood in urine Skin:  [] Rashes   [x] Ulcers   [x] Wounds Psychological:  [] History of anxiety   []  History of major depression.  Physical  Examination  Vitals:   12/02/16 2038 12/02/16 2111 12/03/16 0512 12/03/16 0717  BP:   (!) 133/56 (!) 142/57  Pulse:   88 86  Resp:   18 19  Temp:   99.1 F (37.3 C) 99.2 F (37.3 C)  TempSrc:   Oral Oral  SpO2:   97% 96%  Weight: 191 lb 1.6 oz (86.7 kg)     Height:  5\' 8"  (1.727 m)     Body mass index is 29.06 kg/m. Gen:  WD/WN, NAD Head: [place]/AT, No temporalis wasting. Prominent temp pulse not noted. Ear/Nose/Throat: Hearing grossly intact, nares w/o erythema or drainage, oropharynx w/o Erythema/Exudate Eyes: Sclera non-icteric, conjunctiva clear Neck: Trachea midline.  No JVD.  Pulmonary:  Good air movement, respirations not labored, equal bilaterally.  Cardiac: RRR, normal S1, S2. Vascular:  Vessel Right Left  Radial Palpable Palpable  Ulnar Palpable Palpable  Brachial Palpable Palpable  Carotid Palpable, without bruit Palpable, without bruit  Aorta Not palpable N/A  Femoral Palpable Palpable  Popliteal BKA Palpable  PT BKA Palpable  DP BKA Palpable   Left Lower Extremity: Large left heel ulcer foul-smelling with purulent discharge and necrotic tissue noted  Gastrointestinal: soft, non-tender/non-distended. No guarding/reflex.  Musculoskeletal: M/S 5/5 throughout. Left foot edematous. Neurologic: Sensation grossly intact in extremities.  Symmetrical.  Speech is fluent. Motor exam as listed above. Psychiatric: Judgment intact, Mood & affect appropriate for pt's clinical situation. Lymph : No Cervical, Axillary, or Inguinal lymphadenopathy.  CBC Lab Results  Component Value Date   WBC 9.3 12/03/2016   HGB 11.0 (L) 12/03/2016   HCT 31.7 (L) 12/03/2016   MCV 91.6 12/03/2016   PLT 275 12/03/2016   BMET    Component Value Date/Time   NA 135 12/03/2016 0431   K 3.1 (L) 12/03/2016 1610  CL 96 (L) 12/03/2016 0431   CO2 31 12/03/2016 0431   GLUCOSE 352 (H) 12/03/2016 0431   BUN 20 12/03/2016 0431   CREATININE 0.91 12/03/2016 0431   CALCIUM 8.2 (L) 12/03/2016  0431   GFRNONAA >60 12/03/2016 0431   GFRAA >60 12/03/2016 0431   Estimated Creatinine Clearance: 68.2 mL/min (by C-G formula based on SCr of 0.91 mg/dL).  COAG Lab Results  Component Value Date   INR 1.03 10/03/2014   INR 0.99 10/24/2013   INR 1.2 04/27/2009   Radiology Dg Foot Complete Left  Result Date: 12/02/2016 CLINICAL DATA:  Open wound. EXAM: LEFT FOOT - COMPLETE 3+ VIEW COMPARISON:  08/30/2009. FINDINGS: Small radiopacity is noted over the medial left foot. These could represent foreign bodies. Diffuse soft tissue swelling. No acute or focal bony abnormality identified. IMPRESSION: Small radiopacity is noted over the medial left foot. These could represent small foreign bodies. Diffuse soft tissue swelling noted. No acute bony or joint abnormality identified. Electronically Signed   By: Maisie Fushomas  Register   On: 12/02/2016 16:14   Assessment/Plan 70 year old female with a known history of PAD status post left femoral to posterior tibial bypass, diabetes, s/p right BKA, CVA and essential hypertension who [facility] from [facility] with large left heel decubitus ulcer - stable 1. Atherosclerotic disease with LLE heel ulceration: Recommend LLE angiogram in an attempt to revascularize the extremity. Procedure, risks and benefits explained to patient. All questions answered. Patient wishes to proceed.  2. Wound Care: Would consult wound nurse for local wound care recommendations while in house and upon discharge. 3. DM: Encouraged good control as its slows the progression of atherosclerotic disease and impedes wound healing. 4. Hyperlipidemia: Encouraged good control as its slows the progression of atherosclerotic disease  Discussed with Dr. Weldon Inchesew  KIMBERLY A STEGMAYER, PA-C  12/03/2016 10:30 AM

## 2016-12-03 NOTE — Progress Notes (Signed)
Patient ID: Laurie [name], female   DOB: [birth year], 70 y.o.   MRN: [id number]  [employer] PROGRESS NOTE  Laurie [name]:[id number] DOB: 1946/09/10 DOA: 12/02/2016 PCP: Lonia Blood, MD  HPI/Subjective: Patient seen earlier. Patient in a lot of pain in her heel. Not feeling well.  Objective: Vitals:   12/03/16 1516 12/03/16 1530  BP: (!) 126/55 109/78  Pulse: 89 91  Resp: 18 16  Temp: 98.7 F (37.1 C) 98.2 F (36.8 C)    Filed Weights   12/02/16 1523 12/02/16 2038 12/03/16 1530  Weight: 86.2 kg (190 lb) 86.7 kg (191 lb 1.6 oz) 86.7 kg (191 lb 1 oz)    ROS: Review of Systems  Constitutional: Negative for chills and fever.  Eyes: Negative for blurred vision.  Respiratory: Negative for cough and shortness of breath.   Cardiovascular: Negative for chest pain.  Gastrointestinal: Negative for abdominal pain, constipation, diarrhea, nausea and vomiting.  Genitourinary: Negative for dysuria.  Musculoskeletal: Negative for joint pain.  Neurological: Negative for dizziness and headaches.   Exam: Physical Exam  Constitutional: She is oriented to person, place, and time.  HENT:  Nose: No mucosal edema.  Mouth/Throat: No oropharyngeal exudate or posterior oropharyngeal edema.  Eyes: Conjunctivae, EOM and lids are normal. Pupils are equal, round, and reactive to light.  Neck: No JVD present. Carotid bruit is not present. No edema present. No thyroid mass and no thyromegaly present.  Cardiovascular: S1 normal and S2 normal.  Exam reveals no gallop.   Murmur heard.  Systolic murmur is present with a grade of 2/6  Pulses:      Dorsalis pedis pulses are 2+ on the right side, and 2+ on the left side.  Respiratory: No respiratory distress. She has no wheezes. She has no rhonchi. She has no rales.  GI: Soft. Bowel sounds are normal. There is no tenderness.  Musculoskeletal:       Left ankle: She exhibits swelling.  Lymphadenopathy:    She has no cervical adenopathy.   Neurological: She is alert and oriented to person, place, and time. No cranial nerve deficit.  Skin: Skin is warm. Nails show no clubbing.  Left heel gangrene. Warmth of the left leg. No erythema seen on the leg but surrounding on the left heel.  Psychiatric: She has a normal mood and affect.      Data Reviewed: Basic Metabolic Panel:  Recent Labs Lab 12/02/16 1539 12/03/16 0431  NA 129* 135  K 3.7 3.1*  CL 88* 96*  CO2 31 31  GLUCOSE 584* 352*  BUN 23* 20  CREATININE 1.13* 0.91  CALCIUM 8.6* 8.2*  MG  --  1.6*   Liver Function Tests: No results for input(s): AST, ALT, ALKPHOS, BILITOT, PROT, ALBUMIN in the last 168 hours. No results for input(s): LIPASE, AMYLASE in the last 168 hours. No results for input(s): AMMONIA in the last 168 hours. CBC:  Recent Labs Lab 12/02/16 1539 12/03/16 0431  WBC 9.3 9.3  NEUTROABS 5.4  --   HGB 11.5* 11.0*  HCT 34.4* 31.7*  MCV 93.0 91.6  PLT 283 275    CBG:  Recent Labs Lab 12/02/16 1734 12/02/16 2048 12/03/16 0716 12/03/16 1113  GLUCAP 421* 423* 289* 188*    Recent Results (from the past 240 hour(s))  Blood culture (routine x 2)     Status: None (Preliminary result)   Collection Time: 12/02/16  3:39 PM  Result Value Ref Range Status   Specimen Description BLOOD LEFT ARM  Final   Special Requests BOTTLES DRAWN AEROBIC AND ANAEROBIC 2MLAERO,2MLANA  Final   Culture NO GROWTH < 24 HOURS  Final   Report Status PENDING  Incomplete  Blood culture (routine x 2)     Status: None (Preliminary result)   Collection Time: 12/02/16  3:39 PM  Result Value Ref Range Status   Specimen Description BLOOD LEFT HAND  Final   Special Requests BOTTLES DRAWN AEROBIC AND ANAEROBIC 2MLAERO,1MLANA  Final   Culture  Setup Time Organism ID to follow  Final   Culture NO GROWTH < 24 HOURS  Final   Report Status PENDING  Incomplete  MRSA PCR Screening     Status: None   Collection Time: 12/03/16  1:00 AM  Result Value Ref Range Status    MRSA by PCR NEGATIVE NEGATIVE Final    Comment:        The GeneXpert MRSA Assay (FDA approved for NASAL specimens only), is one component of a comprehensive MRSA colonization surveillance program. It is not intended to diagnose MRSA infection nor to guide or monitor treatment for MRSA infections.      Studies: Dg Foot Complete Left  Result Date: 12/02/2016 CLINICAL DATA:  Open wound. EXAM: LEFT FOOT - COMPLETE 3+ VIEW COMPARISON:  08/30/2009. FINDINGS: Small radiopacity is noted over the medial left foot. These could represent foreign bodies. Diffuse soft tissue swelling. No acute or focal bony abnormality identified. IMPRESSION: Small radiopacity is noted over the medial left foot. These could represent small foreign bodies. Diffuse soft tissue swelling noted. No acute bony or joint abnormality identified. Electronically Signed   By: Maisie Fushomas  Register   On: 12/02/2016 16:14    Scheduled Meds: . [MAR Hold] ALPRAZolam  0.25 mg Oral BID  . [MAR Hold] amphetamine-dextroamphetamine  10 mg Oral BID WC  . [MAR Hold] aspirin EC  81 mg Oral Daily  . [MAR Hold] atorvastatin  10 mg Oral q1800  . [MAR Hold] ceFEPime (MAXIPIME) IV  2 g Intravenous Q12H  . [MAR Hold] cloNIDine  0.1 mg Oral TID  . [MAR Hold] DULoxetine  60 mg Oral Daily  . [MAR Hold] enoxaparin (LOVENOX) injection  40 mg Subcutaneous Q24H  . [MAR Hold] gabapentin  300 mg Oral BID  . [MAR Hold] insulin aspart  0-5 Units Subcutaneous QHS  . [MAR Hold] insulin aspart  0-9 Units Subcutaneous TID WC  . [MAR Hold] insulin glargine  26 Units Subcutaneous QHS  . [MAR Hold] lisinopril  20 mg Oral Daily  . [MAR Hold] nystatin   Topical BID  . [MAR Hold] vancomycin  750 mg Intravenous Q12H   Continuous Infusions: . sodium chloride 50 mL/hr at 12/03/16 1545    Assessment/Plan:  1. Gangrene left heel with history of peripheral vascular disease and diabetes. Vascular surgery currently doing an angiogram. Patient on cefepime and  vancomycin. Patient will need surgery either with an amputation depending on blood supply or debridement of the heel. Sedimentation rate elevated at 73.  2. Type 2 diabetes mellitus. Hemoglobin A1c added on. Lantus increased to 26 units. Sliding scale. 3. Essential hypertension. Continue lisinopril and restart clonidine at lower dose. Blood pressure very variable. 4. Fungal infection groin nystatin powder. 5. Hypokalemia and hypomagnesemia replace electrolytes today and recheck tomorrow 6. History of stroke with left leg weakness. Hold Plavix just in case amputation needed continue aspirin 7. History of anxiety on Xanax 8. History of narcolepsy  Code Status:     Code Status Orders  Start     Ordered   12/02/16 2032  Full code  Continuous     12/02/16 2032    Code Status History    Date Active Date Inactive Code Status Order ID Comments User Context   03/06/2015  6:47 PM 03/10/2015  4:36 PM Full Code [id number]  Lars MageEmma M Collins, PA-C Inpatient   03/05/2015  1:12 PM 03/06/2015  6:47 PM Full Code [id number]  Chuck Hinthristopher S Dickson, MD Inpatient   11/01/2014  8:31 PM 11/07/2014  8:37 PM Full Code [id number]  Penny Piarlando Vega, MD Inpatient   10/03/2014 10:21 AM 10/03/2014  5:18 PM Full Code [id number]  Yates DecampJay Ganji, MD Inpatient   10/26/2013  5:09 PM 11/05/2013  1:40 PM Full Code [id number]  Jacquelynn Creeamela S Love, PA-C Inpatient   10/25/2013  3:55 AM 10/26/2013  5:09 PM Full Code [id number]  Lynden OxfordPranav Patel, MD Inpatient     Disposition Plan: Patient would like a another facility to go to.  Consultants:  Vascular surgery  Podiatry  Procedures:  Angiogram  Antibiotics:  Vancomycin  Maxipime  Time spent: 28 minutes  Alford HighlandWIETING, Neidra Girvan  [employer]

## 2016-12-03 NOTE — Clinical Social Work Placement (Signed)
   CLINICAL SOCIAL WORK PLACEMENT  NOTE  Date:  12/03/2016  Patient Details  Name: Laurie [name] MRN: [id number] Date of Birth: 1946-08-07  Clinical Social Work is seeking post-discharge placement for this patient at the Skilled  Nursing Facility level of care (*CSW will initial, date and re-position this form in  chart as items are completed):  Yes   Patient/family provided with [facility] Clinical Social Work Department's list of facilities offering this level of care within the geographic area requested by the patient (or if unable, by the patient's family).  Yes   Patient/family informed of their freedom to choose among providers that offer the needed level of care, that participate in Medicare, Medicaid or managed care program needed by the patient, have an available bed and are willing to accept the patient.      Patient/family informed of [facility] ownership interest [facility] and [facility], as well as of the fact that they are under no obligation to receive care at these facilities.  PASRR submitted to EDS on       PASRR number received on       Existing PASRR number confirmed on 12/03/16     FL2 transmitted to all facilities in geographic area requested by pt/family on 12/03/16     FL2 transmitted to all facilities within larger geographic area on       Patient informed that his/her managed care company has contracts with or will negotiate with certain facilities, including the following:            Patient/family informed of bed offers received.  Patient chooses bed at       Physician recommends and patient chooses bed at      Patient to be transferred to   on  .  Patient to be transferred to facility by       Patient family notified on   of transfer.  Name of family member notified:        PHYSICIAN       Additional Comment:    _______________________________________________ Mycah Formica, Darleen CrockerBailey M, LCSW 12/03/2016, 9:51 PM

## 2016-12-03 NOTE — Progress Notes (Signed)
Patient still in Specials as she will be requiring anAggrastat Drip Post-Procedure. Patient being transferred to 2A. Specials RN to transport patient to 2A post procedure. Report given to Koleen NimrodAdrian, Charity fundraiserN. Patient's belongings gathered from room- to be transferred to new room, 237. Care transferred.

## 2016-12-03 NOTE — NC FL2 (Signed)
[place] MEDICAID FL2 LEVEL OF CARE SCREENING TOOL     IDENTIFICATION  Patient Name: Laurie [name] Birthdate: June 11, 1946 Sex: female Admission Date (Current Location): 12/02/2016  [facility] [place]  [place]  ([id number]) Facility and Address:  [facility], [address], [place], [place] [address]      Provider Number: [id number]  Attending Physician Name and Address:  Alford Highlandichard Wieting, MD  Relative Name and Phone Number:       Current Level of Care: Hospital Recommended Level of Care: Skilled Nursing Facility Prior Approval Number:    Date Approved/Denied:   PASRR Number:  ( [id number] )  Discharge Plan: SNF    Current Diagnoses: Patient Active Problem List   Diagnosis Date Noted  . Pressure injury of skin 12/03/2016  . PVD (peripheral vascular disease) (HCC) 03/05/2015  . S/P BKA (below knee amputation) (HCC) 11/23/2014  . Depression 11/11/2014  . Gangrene of foot (HCC)   . Hypokalemia   . Peripheral vascular disease (HCC) 11/02/2014  . Gangrene (HCC) 11/02/2014  . Wound, surgical, infected 11/01/2014  . Diabetic foot ulcer (HCC) 11/01/2014  . Trochanteric bursitis of left hip 05/29/2014  . Spastic hemiplegia affecting nondominant side (HCC) 12/02/2013  . Chronic diastolic heart failure, grade 2 10/26/2013  . Dyslipidemia 10/26/2013  . CVA (cerebral infarction) 10/25/2013  . Arthralgia of hip 10/25/2013  . Anemia 05/17/2009  . Diabetes mellitus type 2 with peripheral artery disease (HCC) 05/08/2009  . Anxiety state 05/08/2009  . Essential hypertension 05/08/2009    Orientation RESPIRATION BLADDER Height & Weight     Self, Time, Situation, Place  Normal Incontinent Weight: 191 lb 1.6 oz (86.7 kg) Height:  5\' 8"  (172.7 cm)  BEHAVIORAL SYMPTOMS/MOOD NEUROLOGICAL BOWEL NUTRITION STATUS   (none)  (none) Continent Diet (NPO to be advanced (Please see D/C Summary for updated diet) )  AMBULATORY STATUS COMMUNICATION OF  NEEDS Skin   Extensive Assist Verbally PU Stage and Appropriate Care (Unstagable pressure ulcer on left heel. )                       Personal Care Assistance Level of Assistance  Bathing, Feeding, Dressing Bathing Assistance: Limited assistance Feeding assistance: Independent Dressing Assistance: Limited assistance     Functional Limitations Info  Sight, Hearing, Speech Sight Info: Adequate Hearing Info: Adequate Speech Info: Adequate    SPECIAL CARE FACTORS FREQUENCY                       Contractures      Additional Factors Info  Code Status, Allergies, Insulin Sliding Scale Code Status Info:  (Full Code. ) Allergies Info:  (Penicillins)   Insulin Sliding Scale Info:  (NovoLog Insulin Injections. )       Current Medications (12/03/2016):  This is the current hospital active medication list Current Facility-Administered Medications  Medication Dose Route Frequency Provider Last Rate Last Dose  . 0.9 %  sodium chloride infusion   Intravenous Continuous Adrian SaranSital Mody, MD 100 mL/hr at 12/02/16 2116    . acetaminophen (TYLENOL) tablet 650 mg  650 mg Oral Q6H PRN Adrian SaranSital Mody, MD       Or  . acetaminophen (TYLENOL) suppository 650 mg  650 mg Rectal Q6H PRN Adrian SaranSital Mody, MD      . ALPRAZolam Prudy Feeler(XANAX) tablet 0.25 mg  0.25 mg Oral BID Adrian SaranSital Mody, MD   0.25 mg at 12/02/16 2113  . amphetamine-dextroamphetamine (ADDERALL) tablet 10 mg  10 mg Oral BID WC Adrian SaranSital Mody, MD      . aspirin EC tablet 81 mg  81 mg Oral Daily Adrian SaranSital Mody, MD   81 mg at 12/02/16 2116  . atorvastatin (LIPITOR) tablet 10 mg  10 mg Oral q1800 Sital Mody, MD      . ceFEPIme (MAXIPIME) 2 g in dextrose 5 % 50 mL IVPB  2 g Intravenous Q12H Olene FlossMelissa D Maccia, RPH   2 g at 12/03/16 0620  . citalopram (CELEXA) tablet 10 mg  10 mg Oral Daily Adrian SaranSital Mody, MD   10 mg at 12/02/16 2114  . DULoxetine (CYMBALTA) DR capsule 60 mg  60 mg Oral Daily Adrian SaranSital Mody, MD   60 mg at 12/02/16 2113  . enoxaparin (LOVENOX) injection 40  mg  40 mg Subcutaneous Q24H Adrian SaranSital Mody, MD   40 mg at 12/02/16 2115  . gabapentin (NEURONTIN) capsule 300 mg  300 mg Oral TID Adrian SaranSital Mody, MD   300 mg at 12/02/16 2113  . hydrochlorothiazide (MICROZIDE) capsule 12.5 mg  12.5 mg Oral Daily Adrian SaranSital Mody, MD   12.5 mg at 12/02/16 2113  . HYDROcodone-acetaminophen (NORCO/VICODIN) 5-325 MG per tablet 1-2 tablet  1-2 tablet Oral Q4H PRN Adrian SaranSital Mody, MD      . insulin aspart (novoLOG) injection 0-5 Units  0-5 Units Subcutaneous QHS Adrian SaranSital Mody, MD   5 Units at 12/02/16 2115  . insulin aspart (novoLOG) injection 0-9 Units  0-9 Units Subcutaneous TID WC Adrian SaranSital Mody, MD   5 Units at 12/03/16 0900  . insulin glargine (LANTUS) injection 20 Units  20 Units Subcutaneous QHS Adrian SaranSital Mody, MD   20 Units at 12/02/16 2147  . ketorolac (TORADOL) 15 MG/ML injection 15 mg  15 mg Intravenous Q6H PRN Sital Mody, MD      . lisinopril (PRINIVIL,ZESTRIL) tablet 20 mg  20 mg Oral Daily Sital Mody, MD      . modafinil (PROVIGIL) tablet 100 mg  100 mg Oral Daily Sital Mody, MD      . ondansetron (ZOFRAN) tablet 4 mg  4 mg Oral Q6H PRN Adrian SaranSital Mody, MD       Or  . ondansetron (ZOFRAN) injection 4 mg  4 mg Intravenous Q6H PRN Adrian SaranSital Mody, MD      . oxyCODONE-acetaminophen (PERCOCET/ROXICET) 5-325 MG per tablet 1-2 tablet  1-2 tablet Oral Q6H PRN Adrian SaranSital Mody, MD   2 tablet at 12/02/16 2111  . potassium chloride SA (K-DUR,KLOR-CON) CR tablet 40 mEq  40 mEq Oral Once Alford Highlandichard Wieting, MD      . senna-docusate (Senokot-S) tablet 1 tablet  1 tablet Oral QHS PRN Adrian SaranSital Mody, MD      . vancomycin (VANCOCIN) IVPB 750 mg/150 ml premix  750 mg Intravenous Q12H Olene FlossMelissa D Maccia, RPH   750 mg at 12/03/16 [id number]     Discharge Medications: Please see discharge summary for a list of discharge medications.  Relevant Imaging Results:  Relevant Lab Results:   Additional Information  (SSN: [id number])  Dat Derksen, Darleen CrockerBailey M, LCSW

## 2016-12-03 NOTE — Progress Notes (Signed)
Kim, Vascular PA called back and states that per Dr. Wyn Quakerew- they are working on getting patient down to the Vascular Lab today. Please keep patient NPO today for procedure until further notice from Vascular team. Dr. Renae GlossWieting, hospitalist to be made aware.

## 2016-12-03 NOTE — Op Note (Signed)
[facility] Percutaneous Study/Intervention Procedural Note   Date of Surgery: 12/02/2016 - 12/03/2016  Surgeon(s):Shaquil Aldana   Assistants:none  Pre-operative Diagnosis: PAD with ulceration and infection LLE heel ulcer  Post-operative diagnosis: Same  Procedure(s) Performed: 1. Ultrasound guidance for vascular access right femoral artery 2. Catheter placement into left posterior tibial artery from right femoral approach 3. Aortogram and selective left lower extremity angiogram 4. Percutaneous transluminal angioplasty of the distal bypass anastomosis and posterior tibial artery with 3 mm x 10 cm angioplasty balloon 5. StarClose closure device right femoral artery  EBL: 25 cc  Contrast: 70 cc  Fluoro Time: 4.2 minutes  Moderate Conscious Sedation Time: approximately 45 minutes using 4 mg of Versed and 125 mcg of Fentanyl  Indications: Patient is a 70 y.o.female with a necrotic ulcer on the left heel and known PAD with a previous bypass on that side and a BKA on the right. The patient is brought in for angiography for further evaluation and potential treatment. Risks and benefits are discussed and informed consent is obtained  Procedure: The patient was identified and appropriate procedural time out was performed. The patient was then placed supine on the table and prepped and draped in the usual sterile fashion.Moderate conscious sedation was administered during a face to face encounter with the patient throughout the procedure with my supervision of the RN administering medicines and monitoring the patient's vital signs, pulse oximetry, telemetry and mental status throughout from the start of the procedure until the patient was taken to the recovery room. Ultrasound was used to evaluate the right common femoral artery. It was patent . A digital ultrasound image was acquired. A  Seldinger needle was used to access the right common femoral artery under direct ultrasound guidance and a permanent image was performed. A 0.035 J wire was advanced without resistance and a 5Fr sheath was placed. Pigtail catheter was placed into the aorta and an AP aortogram was performed. This demonstrated normal renal arteries and normal aorta and iliac segments without significant stenosis. I then crossed the aortic bifurcation and advanced to the left femoral head. Selective left lower extremity angiogram was then performed. This demonstrated normal common femoral and profunda femorus artery.  SFA with occlusion.  Bypass from the left common femoral artery artery to the posterior tibial artery with stenosis at the distal anastomosis and in the first 5-6 cm of the posterior tibial artery below the bypass before normalizing just above the ankle.  The degree of stenosis was >80%. The patient was systemically heparinized and a 6 [place] Ansell sheath was then placed over the [employer] wire. I then used a Kumpe catheter and the advantage wire to get into the bypass and down into the calf.  Selective imaging was performed and then I exchanged for a CXI catheter a V18 wire and then crossed the posterior tibial artery lesions and parked the wire in the posterior tibial artery in the foot. I then used a 3 mm diameter x 10 cm length angioplasty balloon to treat the distal bypass anastomosis and the posterior artery below the bypass.  This was inflated to 14 atm for one minute.  Significant vasospasm was seen that improved with 1000 mcg of nitroglycerin, although some spasm still remained.  The degree of stenosis now appeared to be 30-40%. I elected to terminate the procedure. The sheath was removed and StarClose closure device was deployed in the right femoral artery with excellent hemostatic result. The patient was taken to the recovery  room in stable condition having tolerated the procedure well.  Findings:   Aortogram: normal renal arteries, aorta with about 30% stenosis, right iliac artery without stenosis, left iliac stenosis of about 40%. Left Lower Extremity: normal common femoral and profunda femorus artery.  SFA with occlusion.  Bypass from the left common femoral artery artery to the posterior tibial artery with stenosis at the distal anastomosis and in the first 5-6 cm of the posterior tibial artery below the bypass before normalizing just above the ankle.  The degree of stenosis was >80%.   Disposition: Patient was taken to the recovery room in stable condition having tolerated the procedure well.  Complications: None    12/03/2016 5:06 PM   This note was created with Dragon Medical transcription system. Any errors in dictation are purely unintentional. 

## 2016-12-03 NOTE — Consult Note (Signed)
Pharmacy Antibiotic Note  Laurie [name] is a 70 y.o. female admitted on 12/02/2016 with gangrene foot.  Pharmacy has been consulted for cefepime and vancomycin dosing.  Plan: Pt was in a procedure and did not receive her 1300 dose. RN called to have it rescheduled. Rescheduled next dose for 2000. Due to late dose of about 7 hours, will move trough out to the new steady state. 12/15@ 1930  Pt received 1g of vancomycin in the ED. Will give next dose in 6 hours for stacked dosing Vancomycin 750mg  IV every 12hr hours.  Goal trough 15-20 mcg/mL. cefepime 2g q 12hr  Trough prior to the 5th dose 12/14 @ 1230  Height: 5\' 8"  (172.7 cm) Weight: 191 lb 1 oz (86.7 kg) IBW/kg (Calculated) : 63.9  Temp (24hrs), Avg:98.7 F (37.1 C), Min:98.2 F (36.8 C), Max:99.2 F (37.3 C)   Recent Labs Lab 12/02/16 1539 12/03/16 0431  WBC 9.3 9.3  CREATININE 1.13* 0.91    Estimated Creatinine Clearance: 68.2 mL/min (by C-G formula based on SCr of 0.91 mg/dL).    Allergies  Allergen Reactions  . Penicillins Hives    Childhood allergy could have been more reaction only remembers hives.     Antimicrobials this admission: vancomycin 12/112 >>  cefepime 12/12 >>   Dose adjustments this admission:   Microbiology results: 12/12 BCx:    Thank you for allowing pharmacy to be a part of this patient's care.  Laurie [name], Pharm.D, BCPS Clinical Pharmacist  12/03/2016 7:08 PM

## 2016-12-03 NOTE — Progress Notes (Addendum)
Inpatient Diabetes Program Recommendations  AACE/ADA: New Consensus Statement on Inpatient Glycemic Control (2015)  Target Ranges:  Prepandial:   less than 140 mg/dL      Peak postprandial:   less than 180 mg/dL (1-2 hours)      Critically ill patients:  140 - 180 mg/dL   Lab Results  Component Value Date   GLUCAP 423 (H) 12/02/2016   HGBA1C 7.9 (H) 11/01/2014    Review of Glycemic Control  Results for [name], Chriselda W (MRN [id number]) as of 12/03/2016 07:19  Ref. Range 12/02/2016 17:34 12/02/2016 20:48  Glucose-Capillary Latest Ref Range: 65 - 99 mg/dL 956421 (H) 213423 (H)    Diabetes history:Type 2 Outpatient Diabetes medications: Lantus 20 units qhs Current orders for Inpatient glycemic control: Lantus 20 units qhs, Novolog 0-9units tid, Novolog 0-5 units qhs  Inpatient Diabetes Program Recommendations: Consider changing Novolog to moderate correction scale (0-15 units) q4h and increase Lantus to 26 units qhs (elevated fasting sugar)  Per ADA recommendations "consider performing an A1C on all patients with diabetes or hyperglycemia admitted to the hospital if not performed in the prior 3 months".  08:34am spoke to patient who confirms she is only ordered Lantus 20 units qhs for blood sugar management- states "they don't always give it to me".   Laurie RacerJulie Clemma Johnsen, RN, BA, MHA, CDE Diabetes Coordinator Inpatient Diabetes Program  [phone] (Team Pager) [phone] [facility] Office) 12/03/2016 7:37 AM

## 2016-12-04 ENCOUNTER — Inpatient Hospital Stay: Payer: Medicare Other

## 2016-12-04 ENCOUNTER — Encounter: Payer: Self-pay | Admitting: Vascular Surgery

## 2016-12-04 LAB — GLUCOSE, CAPILLARY
GLUCOSE-CAPILLARY: 191 mg/dL — AB (ref 65–99)
GLUCOSE-CAPILLARY: 220 mg/dL — AB (ref 65–99)
GLUCOSE-CAPILLARY: 264 mg/dL — AB (ref 65–99)
Glucose-Capillary: 236 mg/dL — ABNORMAL HIGH (ref 65–99)

## 2016-12-04 LAB — HEMOGLOBIN A1C
HEMOGLOBIN A1C: 14.5 % — AB (ref 4.8–5.6)
MEAN PLASMA GLUCOSE: 369 mg/dL

## 2016-12-04 MED ORDER — POVIDONE-IODINE 10 % EX SWAB
2.0000 "application " | Freq: Once | CUTANEOUS | Status: AC
  Administered 2016-12-04: 2 via TOPICAL

## 2016-12-04 MED ORDER — CLINDAMYCIN PHOSPHATE 900 MG/50ML IV SOLN
900.0000 mg | INTRAVENOUS | Status: AC
  Administered 2016-12-05: 900 mg via INTRAVENOUS
  Filled 2016-12-04: qty 50

## 2016-12-04 MED ORDER — MAGNESIUM SULFATE 2 GM/50ML IV SOLN
2.0000 g | Freq: Once | INTRAVENOUS | Status: AC
  Administered 2016-12-04: 2 g via INTRAVENOUS
  Filled 2016-12-04 (×2): qty 50

## 2016-12-04 MED ORDER — DICLOFENAC SODIUM 1 % TD GEL
2.0000 g | Freq: Four times a day (QID) | TRANSDERMAL | Status: DC
  Administered 2016-12-04 – 2016-12-08 (×16): 2 g via TOPICAL
  Filled 2016-12-04: qty 100

## 2016-12-04 MED ORDER — DEXTROSE 5 % IV SOLN
2.0000 g | Freq: Three times a day (TID) | INTRAVENOUS | Status: DC
  Administered 2016-12-04 – 2016-12-07 (×9): 2 g via INTRAVENOUS
  Filled 2016-12-04 (×13): qty 2

## 2016-12-04 MED ORDER — CHLORHEXIDINE GLUCONATE 4 % EX LIQD
60.0000 mL | Freq: Once | CUTANEOUS | Status: AC
  Administered 2016-12-04: 4 via TOPICAL

## 2016-12-04 NOTE — Progress Notes (Addendum)
[facility] [place] [facility]   PATIENT NAME: Laurie [name]    MR#:  [id number]  DATE OF BIRTH:  February 25, 1946  SUBJECTIVE:  Patient is doing fairly well this morning.  REVIEW OF SYSTEMS:    Review of Systems  Constitutional: Negative.  Negative for chills, fever and malaise/fatigue.  HENT: Negative.  Negative for ear discharge, ear pain, hearing loss, nosebleeds and sore throat.   Eyes: Negative.  Negative for blurred vision and pain.  Respiratory: Negative.  Negative for cough, hemoptysis, shortness of breath and wheezing.   Cardiovascular: Negative.  Negative for chest pain, palpitations and leg swelling.  Gastrointestinal: Negative.  Negative for abdominal pain, blood in stool, diarrhea, nausea and vomiting.  Genitourinary: Negative.  Negative for dysuria.  Musculoskeletal: Positive for joint pain. Negative for back pain.  Skin:       Left foot draining foul smelling  Neurological: Negative for dizziness, tremors, speech change, focal weakness, seizures and headaches.  Endo/Heme/Allergies: Negative.  Does not bruise/bleed easily.  Psychiatric/Behavioral: Negative.  Negative for depression, hallucinations and suicidal ideas.    Tolerating Diet: yes      DRUG ALLERGIES:   Allergies  Allergen Reactions  . Penicillins Hives    Childhood allergy could have been more reaction only remembers hives.     VITALS:  Blood pressure (!) 173/71, pulse (!) 52, temperature 100.3 F (37.9 C), temperature source Oral, resp. rate 20, height 5\' 8"  (1.727 m), weight 86.7 kg (191 lb 1 oz), SpO2 94 %.  PHYSICAL EXAMINATION:   Physical Exam  Constitutional: She is oriented to person, place, and time and well-developed, well-nourished, and in no distress. No distress.  HENT:  Head: Normocephalic.  Eyes: No scleral icterus.  Neck: Normal range of motion. Neck supple. No JVD present. No tracheal deviation present.  Cardiovascular: Normal rate, regular rhythm and  normal heart sounds.  Exam reveals no gallop and no friction rub.   No murmur heard. Pulmonary/Chest: Effort normal and breath sounds normal. No respiratory distress. She has no wheezes. She has no rales. She exhibits no tenderness.  Abdominal: Soft. Bowel sounds are normal. She exhibits no distension and no mass. There is no tenderness. There is no rebound and no guarding.  Musculoskeletal: Normal range of motion. She exhibits no edema.  Neurological: She is alert and oriented to person, place, and time.  Skin: Skin is warm. No rash noted. There is erythema.  Necrotic foot foul smell drainage on bandage  Psychiatric: Affect and judgment normal.      LABORATORY PANEL:   CBC  Recent Labs Lab 12/03/16 0431  WBC 9.3  HGB 11.0*  HCT 31.7*  PLT 275   ------------------------------------------------------------------------------------------------------------------  Chemistries   Recent Labs Lab 12/03/16 0431  NA 135  K 3.1*  CL 96*  CO2 31  GLUCOSE 352*  BUN 20  CREATININE 0.91  CALCIUM 8.2*  MG 1.6*   ------------------------------------------------------------------------------------------------------------------  Cardiac Enzymes No results for input(s): TROPONINI in the last 168 hours. ------------------------------------------------------------------------------------------------------------------  RADIOLOGY:  Mr Ankle Left Wo Contrast  Result Date: 12/04/2016 CLINICAL DATA:  Ulceration of the left heel.  Draining wound. EXAM: MRI OF THE LEFT ANKLE WITHOUT CONTRAST TECHNIQUE: Multiplanar, multisequence MR imaging of the ankle was performed. No intravenous contrast was administered. COMPARISON:  12/02/2016 radiographs FINDINGS: TENDONS Peroneal: Unremarkable Posteromedial: Unremarkable Anterior: Unremarkable Achilles: Unremarkable Plantar Fascia: Low-level edema tracks along the plantar fascia, without significant thickening. LIGAMENTS Lateral: Unremarkable Medial:  Unremarkable CARTILAGE Ankle Joint: Mild degenerative chondral thinning.  Upper normal amount of fluid in the joint. Subtalar Joints/Sinus Tarsi: Mild degenerative chondral thinning. Bones: There is trace edema along the posterior inferior calcaneal endostium and adjacent marrow, as on image 17/7. This directly underlies a region of considerable ulceration and cutaneous defect, spanning a large portion of the posterior heel. There is edema in the subcutaneous tissues and also considerable thinning of the subcutaneous tissues overlying the periosteal margin down to about 5 mm in some points. Other: Small focal metal artifact along the skin along the plantar -lateral foot at about the level of the cuboid, image 37/4, probably some type of microscopic metallic body given the lack of visible metal density on radiography. There is diffuse dorsal subcutaneous edema tracking in the foot and low-level edema tracking in the musculature of the foot. Extensive subcutaneous edema around the ankle. IMPRESSION: 1. Large posterior heel ulceration with large cutaneous defect and considerable thinning of the subcutaneous tissues overlying the posterior inferior calcaneus, with adjacent low-level endosteal and marrow edema in the calcaneus. At this stage I suspect that this edema, being subtle, is probably reactive. The possibility that this could represent very early osteomyelitis cannot be totally excluded, but there no overt destructive findings yet. 2. Considerable edema in the ankle and foot both in the subcutaneous tissues, and also with some edema tracking within along the musculature of the ankle and foot. 3. Low-level edema tracking along the plantar fascia seemed proportionate to the rest of the edema and probably does not imply independent plantar fasciitis. 4. Mild degenerative chondral thinning in the tibiotalar joint. Mild chondral thinning in the subtalar joints. Electronically Signed   By: Gaylyn Rong M.D.   On:  12/04/2016 11:05   Dg Foot Complete Left  Result Date: 12/02/2016 CLINICAL DATA:  Open wound. EXAM: LEFT FOOT - COMPLETE 3+ VIEW COMPARISON:  08/30/2009. FINDINGS: Small radiopacity is noted over the medial left foot. These could represent foreign bodies. Diffuse soft tissue swelling. No acute or focal bony abnormality identified. IMPRESSION: Small radiopacity is noted over the medial left foot. These could represent small foreign bodies. Diffuse soft tissue swelling noted. No acute bony or joint abnormality identified. Electronically Signed   By: Maisie Fus  Register   On: 12/02/2016 16:14     ASSESSMENT AND PLAN:    69 year old female with history of PAD recently seen by her vascular surgeon for left decubitus/necrotic heel ulcer who presents with foul smelling and painful left decubitus ulcer.  1. Diabetic left heel decubitus ulcer  Patient underwent angiogram which showed some tibial disease and open bypass graft   she will continue cefepime and vancomycin Patient at high risk of amputation appreciate vascular podiatry consult  2. Uncontrolled Diabetes insulin dependent worsened from infection: Continue Lantus 26 units with sliding scale insulin and ADA diet   3. Essential hypertension: Continue lisinopril 4. PAD on Plavix   5. Hyperlipidemia on atorvastatin      Management plans discussed with the patient and she is in agreement.  CODE STATUS: FULL  TOTAL TIME TAKING CARE OF THIS PATIENT: 25 minutes.   D/w dr dew  POSSIBLE D/C  3-6 days, DEPENDING ON CLINICAL CONDITION.   Jadwiga Faidley M.D on 12/04/2016 at 11:52 AM  Between 7am to 6pm - Pager - [phone] After 6pm go to [email or web address] - password [email or web address]  [facility]  Office  [phone]  CC: Primary care physician; Lonia Blood, MD  Note: This dictation was prepared with Dragon dictation along with smaller phrase technology. Any transcriptional errors  that result from this process are  unintentional.

## 2016-12-04 NOTE — Consult Note (Signed)
Pharmacy Antibiotic Note  Laurie [name] is a 70 y.o. female admitted on 12/02/2016 with gangrene foot.  Pharmacy has been consulted for cefepime and vancomycin dosing.  Plan: Continue vancomycin 750mg  IV every 12hr hours.   Goal trough 15-20 mcg/mL. Vancomycin trough 12/15 @ 1930 (pushed out due to dose being given late on 12/13)  Increase cefepime to 2 g IV q8h. Spoke with RN to confirm patient was tolerating cefepime given PCN allergy. Per RN - no signs/symptoms of reaction and no complaints from patient.  Height: 5\' 8"  (172.7 cm) Weight: 191 lb 1 oz (86.7 kg) IBW/kg (Calculated) : 63.9  Temp (24hrs), Avg:99 F (37.2 C), Min:97.9 F (36.6 C), Max:100.6 F (38.1 C)   Recent Labs Lab 12/02/16 1539 12/03/16 0431  WBC 9.3 9.3  CREATININE 1.13* 0.91    Estimated Creatinine Clearance: 68.2 mL/min (by C-G formula based on SCr of 0.91 mg/dL).    Allergies  Allergen Reactions  . Penicillins Hives    Childhood allergy could have been more reaction only remembers hives.     Antimicrobials this admission: vancomycin 12/112 >>  cefepime 12/12 >>   Dose adjustments this admission:   Microbiology results: 12/12 BCx: staph species, sensitivities pending  Thank you for allowing pharmacy to be a part of this patient's care.  Cindi CarbonMary M Breyonna Nault, Pharm.D, BCPS Clinical Pharmacist 12/04/2016 9:34 AM

## 2016-12-04 NOTE — Clinical Social Work Note (Addendum)
MSW attempted to contact patient's daughter Shanda BumpsJessica to discuss SNF placement options.  MSW attempted to leave a message, but patient's voice mail is full.  MSW awaiting call back from patient's daughter.  12:10pm  MSW received phone call back from patient's daughter, MSW gave patient's daughter bed offers, she will research options and let MSW know.  MSW to continue to follow patient's progress throughout discharge planning.  Ervin KnackEric R. Louvinia Cumbo, MSW [phone]  Mon-Fri 8a-4:30p 12/04/2016 11:52 AM

## 2016-12-04 NOTE — Progress Notes (Signed)
Daily Progress Note   Subjective  - 1 Day Post-Op  F/u left heel ulceration. Had MRI today.  Objective Vitals:   12/03/16 1932 12/04/16 0504 12/04/16 0804 12/04/16 1157  BP: (!) 149/71 (!) 173/69 (!) 173/71 (!) 148/66  Pulse: 91 93 (!) 52 92  Resp: 18 18 20    Temp: 97.9 F (36.6 C) (!) 100.6 F (38.1 C) 100.3 F (37.9 C) 99.3 F (37.4 C)  TempSrc: Oral Oral Oral Oral  SpO2: 95% 96% 94% 100%  Weight:      Height:        Physical Exam: Heel with large necrotic ulceration with drainage to bandage.  Laboratory CBC    Component Value Date/Time   WBC 9.3 12/03/2016 0431   HGB 11.0 (L) 12/03/2016 0431   HCT 31.7 (L) 12/03/2016 0431   PLT 275 12/03/2016 0431    BMET    Component Value Date/Time   NA 135 12/03/2016 0431   K 3.1 (L) 12/03/2016 0431   CL 96 (L) 12/03/2016 0431   CO2 31 12/03/2016 0431   GLUCOSE 352 (H) 12/03/2016 0431   BUN 20 12/03/2016 0431   CREATININE 0.91 12/03/2016 0431   CALCIUM 8.2 (L) 12/03/2016 0431   GFRNONAA >60 12/03/2016 0431   GFRAA >60 12/03/2016 0431   MRI: IMPRESSION: 1. Large posterior heel ulceration with large cutaneous defect and considerable thinning of the subcutaneous tissues overlying the posterior inferior calcaneus, with adjacent low-level endosteal and marrow edema in the calcaneus. At this stage I suspect that this edema, being subtle, is probably reactive. The possibility that this could represent very early osteomyelitis cannot be totally excluded, but there no overt destructive findings yet. 2. Considerable edema in the ankle and foot both in the subcutaneous tissues, and also with some edema tracking within along the musculature of the ankle and foot. 3. Low-level edema tracking along the plantar fascia seemed proportionate to the rest of the edema and probably does not imply independent plantar fasciitis. 4. Mild degenerative chondral thinning in the tibiotalar joint. Mild chondral thinning in the subtalar  joints.   Electronically Signed   By: Gaylyn RongWalter  [name] M.D.   On: 12/04/2016 11:05 Assessment/Planning: Large necrotic heel ulceration.   D/W pt possibility of debridement to attempt limb salvage.  Large ulceration with necrosis and soft tissue infection.  D/W daughter Laurie [name](Laurie [name]) surgical procedure as well.  Risks of complications from surgery, and anesthesia including possibilty of amputation and death.  Consent given by pt.  NPO p midnight.   Laurie RevelsFowler, Laurie [name] A  12/04/2016, 5:20 PM

## 2016-12-04 NOTE — Progress Notes (Signed)
[facility]  Daily Progress Note   Subjective: 1 Day Post-Op: Ultrasound guidance for vascular access right femoral artery, Catheter placement into left posterior tibial artery from right femoral approach, Aortogram and selective left lower extremity angiogram, Percutaneous transluminal angioplasty of the distal bypass anastomosis and posterior tibial artery with 3 mm x 10 cm angioplasty balloon with StarClose closure device right femoral artery  Patient without complaint. For left heel debridement with podiatry today. Still concerned about the possibility of losing her leg. Asking for prayers.   Objective: Vitals:   12/03/16 1932 12/04/16 0504 12/04/16 0804 12/04/16 1157  BP: (!) 149/71 (!) 173/69 (!) 173/71 (!) 148/66  Pulse: 91 93 (!) 52 92  Resp: 18 18 20    Temp: 97.9 F (36.6 C) (!) 100.6 F (38.1 C) 100.3 F (37.9 C) 99.3 F (37.4 C)  TempSrc: Oral Oral Oral Oral  SpO2: 95% 96% 94% 100%  Weight:      Height:        Intake/Output Summary (Last 24 hours) at 12/04/16 1519 Last data filed at 12/04/16 1442  Gross per 24 hour  Intake          1732.32 ml  Output                0 ml  Net          1732.32 ml   Physical Exam: A&Ox3, NAD CV: RRR Pulmonary: CTA Bilaterally Abdomen: Soft, Non-tender, Non-distended Right Groin: Dressing in place. No swelling or drainage noted.  Vascular:  Right BKA - healthy  Left: Heel in boot. Extremity warm. Foot edematous but warm.    Laboratory: CBC    Component Value Date/Time   WBC 9.3 12/03/2016 0431   HGB 11.0 (L) 12/03/2016 0431   HCT 31.7 (L) 12/03/2016 0431   PLT 275 12/03/2016 0431   BMET    Component Value Date/Time   NA 135 12/03/2016 0431   K 3.1 (L) 12/03/2016 0431   CL 96 (L) 12/03/2016 0431   CO2 31 12/03/2016 0431   GLUCOSE 352 (H) 12/03/2016 0431   BUN 20 12/03/2016 0431   CREATININE 0.91 12/03/2016 0431   CALCIUM 8.2 (L) 12/03/2016 0431   GFRNONAA >60 12/03/2016 0431   GFRAA >60  12/03/2016 0431   Assessment/Planning: 70 year old female s/p left lower extremity angiogram - Stable 1) Aggrastat course finished 2) Debridement with podiatry 3) Will follow along during inpatient stay  Cleda DaubKimberly Jaeli Grubb PA-C 12/04/2016 3:19 PM

## 2016-12-05 ENCOUNTER — Encounter
Admission: EM | Disposition: A | Discharge status: Skilled Nursing Facility | Payer: Self-pay | Source: Home / Self Care | Attending: Internal Medicine

## 2016-12-05 ENCOUNTER — Inpatient Hospital Stay: Payer: Medicare Other | Admitting: Anesthesiology

## 2016-12-05 ENCOUNTER — Ambulatory Visit: Payer: Medicare Other | Admitting: Surgery

## 2016-12-05 HISTORY — PX: IRRIGATION AND DEBRIDEMENT FOOT: SHX6602

## 2016-12-05 LAB — CBC
HCT: 33.7 % — ABNORMAL LOW (ref 35.0–47.0)
Hemoglobin: 11.4 g/dL — ABNORMAL LOW (ref 12.0–16.0)
MCH: 30.6 pg (ref 26.0–34.0)
MCHC: 33.8 g/dL (ref 32.0–36.0)
MCV: 90.5 fL (ref 80.0–100.0)
PLATELETS: 294 10*3/uL (ref 150–440)
RBC: 3.72 MIL/uL — AB (ref 3.80–5.20)
RDW: 12.3 % (ref 11.5–14.5)
WBC: 7.3 10*3/uL (ref 3.6–11.0)

## 2016-12-05 LAB — VANCOMYCIN, TROUGH: Vancomycin Tr: 14 ug/mL — ABNORMAL LOW (ref 15–20)

## 2016-12-05 LAB — CULTURE, BLOOD (ROUTINE X 2)

## 2016-12-05 LAB — BASIC METABOLIC PANEL
Anion gap: 7 (ref 5–15)
BUN: 14 mg/dL (ref 6–20)
CALCIUM: 8.5 mg/dL — AB (ref 8.9–10.3)
CO2: 25 mmol/L (ref 22–32)
CREATININE: 0.66 mg/dL (ref 0.44–1.00)
Chloride: 106 mmol/L (ref 101–111)
GFR calc Af Amer: >60 mL/min (ref >60)
GFR calc non Af Amer: >60 mL/min (ref >60)
GLUCOSE: 188 mg/dL — AB (ref 65–99)
Potassium: 3.4 mmol/L — ABNORMAL LOW (ref 3.5–5.1)
Sodium: 138 mmol/L (ref 135–145)

## 2016-12-05 LAB — GLUCOSE, CAPILLARY
GLUCOSE-CAPILLARY: 184 mg/dL — AB (ref 65–99)
GLUCOSE-CAPILLARY: 82 mg/dL (ref 65–99)
GLUCOSE-CAPILLARY: 90 mg/dL (ref 65–99)
Glucose-Capillary: 92 mg/dL (ref 65–99)
Glucose-Capillary: 166 mg/dL — ABNORMAL HIGH (ref 65–99)

## 2016-12-05 SURGERY — IRRIGATION AND DEBRIDEMENT FOOT
Anesthesia: General | Site: Foot | Laterality: Left | Wound class: Dirty or Infected

## 2016-12-05 MED ORDER — MIDAZOLAM HCL 2 MG/2ML IJ SOLN
INTRAMUSCULAR | Status: DC | PRN
  Administered 2016-12-05: 2 mg via INTRAVENOUS

## 2016-12-05 MED ORDER — LIDOCAINE HCL (CARDIAC) 20 MG/ML IV SOLN
INTRAVENOUS | Status: DC | PRN
  Administered 2016-12-05: 50 mg via INTRAVENOUS

## 2016-12-05 MED ORDER — PHENYLEPHRINE HCL 10 MG/ML IJ SOLN
INTRAMUSCULAR | Status: DC | PRN
  Administered 2016-12-05 (×2): 100 ug via INTRAVENOUS

## 2016-12-05 MED ORDER — BUPIVACAINE HCL 0.5 % IJ SOLN
INTRAMUSCULAR | Status: DC | PRN
  Administered 2016-12-05: 10 mL

## 2016-12-05 MED ORDER — ONDANSETRON HCL 4 MG/2ML IJ SOLN
INTRAMUSCULAR | Status: DC | PRN
  Administered 2016-12-05: 4 mg via INTRAVENOUS

## 2016-12-05 MED ORDER — FENTANYL CITRATE (PF) 100 MCG/2ML IJ SOLN
INTRAMUSCULAR | Status: DC | PRN
  Administered 2016-12-05: 50 ug via INTRAVENOUS

## 2016-12-05 MED ORDER — OXYCODONE HCL 5 MG PO TABS: 5.0000 mg | ORAL_TABLET | Freq: Once | ORAL | Status: DC | PRN

## 2016-12-05 MED ORDER — FENTANYL CITRATE (PF) 100 MCG/2ML IJ SOLN: 25.0000 ug | INTRAMUSCULAR | Status: DC | PRN

## 2016-12-05 MED ORDER — VANCOMYCIN HCL IN DEXTROSE 1-5 GM/200ML-% IV SOLN
1000.0000 mg | Freq: Two times a day (BID) | INTRAVENOUS | Status: DC
  Administered 2016-12-06 – 2016-12-07 (×3): 1000 mg via INTRAVENOUS
  Filled 2016-12-05 (×5): qty 200

## 2016-12-05 MED ORDER — MEPERIDINE HCL 25 MG/ML IJ SOLN: 6.2500 mg | INTRAMUSCULAR | Status: DC | PRN

## 2016-12-05 MED ORDER — OXYCODONE HCL 5 MG/5ML PO SOLN: 5.0000 mg | Freq: Once | ORAL | Status: DC | PRN

## 2016-12-05 MED ORDER — PROMETHAZINE HCL 25 MG/ML IJ SOLN: 6.2500 mg | INTRAMUSCULAR | Status: DC | PRN

## 2016-12-05 MED ORDER — PROPOFOL 10 MG/ML IV BOLUS
INTRAVENOUS | Status: DC | PRN
  Administered 2016-12-05: 150 mg via INTRAVENOUS

## 2016-12-05 SURGICAL SUPPLY — 67 items
APPLICATOR COTTON TIP 6IN STRL (MISCELLANEOUS) ×1 IMPLANT
BANDAGE ACE 4X5 VEL STRL LF (GAUZE/BANDAGES/DRESSINGS) ×2 IMPLANT
BANDAGE STRETCH 3X4.1 STRL (GAUZE/BANDAGES/DRESSINGS) ×2 IMPLANT
BLADE OSC/SAGITTAL MD 5.5X18 (BLADE) IMPLANT
BLADE OSCILLATING/SAGITTAL (BLADE)
BLADE SURG 10 STRL SS SAFETY (BLADE) ×1 IMPLANT
BLADE SW THK.38XMED LNG THN (BLADE) IMPLANT
BNDG CMPR 75X21 PLY HI ABS (MISCELLANEOUS) ×1
BNDG COHESIVE 4X5 TAN STRL (GAUZE/BANDAGES/DRESSINGS) ×2 IMPLANT
BNDG COHESIVE 6X5 TAN STRL LF (GAUZE/BANDAGES/DRESSINGS) ×2 IMPLANT
BNDG ESMARK 4X12 TAN STRL LF (GAUZE/BANDAGES/DRESSINGS) ×2 IMPLANT
BNDG GAUZE 4.5X4.1 6PLY STRL (MISCELLANEOUS) ×2 IMPLANT
CANISTER SUCT 1200ML W/VALVE (MISCELLANEOUS) ×2 IMPLANT
CANISTER SUCT 3000ML (MISCELLANEOUS) ×2 IMPLANT
CUFF TOURN 18 STER (MISCELLANEOUS) ×1 IMPLANT
CUFF TOURN DUAL PL 12 NO SLV (MISCELLANEOUS) ×1 IMPLANT
DRAPE FLUOR MINI C-ARM 54X84 (DRAPES) IMPLANT
DRAPE XRAY CASSETTE 23X24 (DRAPES) IMPLANT
DRESSING ALLEVYN 4X4 (MISCELLANEOUS) IMPLANT
DURAPREP 26ML APPLICATOR (WOUND CARE) ×2 IMPLANT
ELECT REM PT RETURN 9FT ADLT (ELECTROSURGICAL) ×2
ELECTRODE REM PT RTRN 9FT ADLT (ELECTROSURGICAL) ×1 IMPLANT
GAUZE PACKING 1/4X5YD (GAUZE/BANDAGES/DRESSINGS) ×2 IMPLANT
GAUZE PACKING IODOFORM 1X5 (MISCELLANEOUS) ×2 IMPLANT
GAUZE PETRO XEROFOAM 1X8 (MISCELLANEOUS) ×2 IMPLANT
GAUZE SPONGE 4X4 12PLY STRL (GAUZE/BANDAGES/DRESSINGS) ×2 IMPLANT
GAUZE STRETCH 2X75IN STRL (MISCELLANEOUS) ×2 IMPLANT
GLOVE BIO SURGEON STRL SZ7.5 (GLOVE) ×2 IMPLANT
GLOVE INDICATOR 8.0 STRL GRN (GLOVE) ×2 IMPLANT
GOWN STRL REUS W/ TWL LRG LVL3 (GOWN DISPOSABLE) ×2 IMPLANT
GOWN STRL REUS W/TWL LRG LVL3 (GOWN DISPOSABLE) ×4
GOWN STRL REUS W/TWL MED LVL3 (GOWN DISPOSABLE) ×4 IMPLANT
HANDPIECE INTERPULSE COAX TIP (DISPOSABLE) ×2
HANDPIECE VERSAJET DEBRIDEMENT (MISCELLANEOUS) IMPLANT
HEMOSTAT SURGICEL 2X14 (HEMOSTASIS) ×2 IMPLANT
IV NS 1000ML (IV SOLUTION) ×2
IV NS 1000ML BAXH (IV SOLUTION) ×1 IMPLANT
KIT DRSG VAC SLVR GRANUFM (MISCELLANEOUS) ×1 IMPLANT
KIT RM TURNOVER STRD PROC AR (KITS) ×2 IMPLANT
LABEL OR SOLS (LABEL) ×2 IMPLANT
NDL FILTER BLUNT 18X1 1/2 (NEEDLE) ×1 IMPLANT
NDL HYPO 25X1 1.5 SAFETY (NEEDLE) ×1 IMPLANT
NEEDLE FILTER BLUNT 18X 1/2SAF (NEEDLE) ×1
NEEDLE FILTER BLUNT 18X1 1/2 (NEEDLE) ×1 IMPLANT
NEEDLE HYPO 25X1 1.5 SAFETY (NEEDLE) ×2 IMPLANT
NS IRRIG 500ML POUR BTL (IV SOLUTION) ×2 IMPLANT
PACK EXTREMITY ARMC (MISCELLANEOUS) ×2 IMPLANT
PAD ABD DERMACEA PRESS 5X9 (GAUZE/BANDAGES/DRESSINGS) ×2 IMPLANT
RASP SM TEAR CROSS CUT (RASP) IMPLANT
SET HNDPC FAN SPRY TIP SCT (DISPOSABLE) ×1 IMPLANT
SOL .9 NS 3000ML IRR  AL (IV SOLUTION) ×1
SOL .9 NS 3000ML IRR AL (IV SOLUTION) ×1
SOL .9 NS 3000ML IRR UROMATIC (IV SOLUTION) ×1 IMPLANT
SOL PREP PVP 2OZ (MISCELLANEOUS) ×2
SOLUTION PREP PVP 2OZ (MISCELLANEOUS) ×1 IMPLANT
STOCKINETTE IMPERVIOUS 9X36 MD (GAUZE/BANDAGES/DRESSINGS) ×2 IMPLANT
SUT ETHILON 2 0 FS 18 (SUTURE) ×4 IMPLANT
SUT ETHILON 4-0 (SUTURE) ×2
SUT ETHILON 4-0 FS2 18XMFL BLK (SUTURE) ×1
SUT VIC AB 3-0 SH 27 (SUTURE) ×2
SUT VIC AB 3-0 SH 27X BRD (SUTURE) ×1 IMPLANT
SUT VIC AB 4-0 FS2 27 (SUTURE) ×2 IMPLANT
SUTURE ETHLN 4-0 FS2 18XMF BLK (SUTURE) ×1 IMPLANT
SWAB CULTURE AMIES ANAERIB BLU (MISCELLANEOUS) ×1 IMPLANT
SYR 3ML LL SCALE MARK (SYRINGE) ×2 IMPLANT
SYRINGE 10CC LL (SYRINGE) ×4 IMPLANT
WND VAC CANISTER 500ML (MISCELLANEOUS) ×1 IMPLANT

## 2016-12-05 NOTE — Consult Note (Signed)
Pharmacy Antibiotic Note  Laurie [name] is a 70 y.o. female admitted on 12/02/2016 with gangrene foot.  Pharmacy has been consulted for cefepime and vancomycin dosing.  Plan: Vanc trough resulted at 14. Trough drawn 1.25hr prior to next dose being due. Will increase dose to vancomycin 1g q 12 hours starting in the AM as this evenings dose was already given. Recheck level prior to the 4th new dose. Follow cultures.  Continue  cefepime to 2 g IV q8h.  Per RN - no signs/symptoms of reaction and no complaints from patient.  Height: 5\' 8"  (172.7 cm) Weight: 191 lb 1 oz (86.7 kg) IBW/kg (Calculated) : 63.9  Temp (24hrs), Avg:97.8 F (36.6 C), Min:97.2 F (36.2 C), Max:98.5 F (36.9 C)   Recent Labs Lab 12/02/16 1539 12/03/16 0431 12/05/16 0600 12/05/16 0653 12/05/16 1842  WBC 9.3 9.3  --  7.3  --   CREATININE 1.13* 0.91 0.66  --   --   VANCOTROUGH  --   --   --   --  14*    Estimated Creatinine Clearance: 77.6 mL/min (by C-G formula based on SCr of 0.66 mg/dL).    Allergies  Allergen Reactions  . Penicillins Hives    Childhood allergy could have been more reaction only remembers hives.     Antimicrobials this admission: vancomycin 12/112 >>  cefepime 12/12 >>   Dose adjustments this admission:   Microbiology results: 12/12 BCx: staph species, sensitivities pending  Thank you for allowing pharmacy to be a part of this patient's care.  Olene FlossMelissa D Reshonda Koerber, Pharm.D, BCPS Clinical Pharmacist 12/05/2016 8:09 PM

## 2016-12-05 NOTE — Anesthesia Preprocedure Evaluation (Signed)
Anesthesia Evaluation  Patient identified by MRN, date of birth, ID band Patient awake    Reviewed: Allergy & Precautions, NPO status , Patient's Chart, lab work & pertinent test results  History of Anesthesia Complications Negative for: history of anesthetic complications  Airway Mallampati: II  TM Distance: >3 FB Neck ROM: Full    Dental  (+) Edentulous Lower, Upper Dentures   Pulmonary neg sleep apnea, neg COPD, former smoker,    breath sounds clear to auscultation- rhonchi (-) wheezing      Cardiovascular hypertension, Pt. on medications + Peripheral Vascular Disease  (-) CAD and (-) Past MI  Rhythm:Regular Rate:Normal - Systolic murmurs and - Diastolic murmurs    Neuro/Psych  Headaches, Anxiety Depression CVA (mild L sided weakness), Residual Symptoms    GI/Hepatic negative GI ROS, Neg liver ROS,   Endo/Other  diabetes, Insulin Dependent  Renal/GU negative Renal ROS     Musculoskeletal negative musculoskeletal ROS (+)   Abdominal (+) - obese,   Peds  Hematology  (+) anemia ,   Anesthesia Other Findings Past Medical History: No date: Anemia No date: Anxiety No date: Bacteremia No date: Bursitis     Comment: "left hip; sometimes left shoulder"               (03/05/2015) No date: Chest pain     Comment: Troponins up to 0.12 while hospitalized for               E.coli PNA No date: Depression No date: Family history of adverse reaction to anesthes*     Comment: "brother died 06/24/2013; woke up w/headache,               vomited blood; drilled holes in his head;               bleeding stroke; never came back" (03/05/2015) No date: Headache     Comment: "when someone gets on my nerves" (03/05/2015) No date: High cholesterol No date: Hypertension No date: Narcolepsy No date: Sacral decubitus ulcer     Comment: Stage III, 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 10/2014: Stroke [facility](HCC)     Comment: "weaker on left side since"  (03/05/2015) No date: Type II diabetes mellitus (HCC) 04/2009: Walking pneumonia     Comment: "double" Hattie Perch/notes 04/23/2011   Reproductive/Obstetrics                             Anesthesia Physical Anesthesia Plan  ASA: III  Anesthesia Plan: General   Post-op Pain Management:    Induction: Intravenous  Airway Management Planned: Oral ETT  Additional Equipment:   Intra-op Plan:   Post-operative Plan: Extubation in OR  Informed Consent: I have reviewed the patients History and Physical, chart, labs and discussed the procedure including the risks, benefits and alternatives for the proposed anesthesia with the patient or authorized representative who has indicated his/her understanding and acceptance.   Dental advisory given  Plan Discussed with: CRNA and Anesthesiologist  Anesthesia Plan Comments:         Anesthesia Quick Evaluation

## 2016-12-05 NOTE — Consult Note (Signed)
Pharmacy Antibiotic Note  Laurie [name] is a 70 y.o. female admitted on 12/02/2016 with gangrene foot.  Pharmacy has been consulted for cefepime and vancomycin dosing.  Plan: Continue vancomycin 750mg  IV every 12hr hours.   Goal trough 15-20 mcg/mL. Vancomycin trough 12/15 @ 1930 (pushed out due to dose being given late on 12/13)  Continue  cefepime to 2 g IV q8h.  Per RN - no signs/symptoms of reaction and no complaints from patient.  Height: 5\' 8"  (172.7 cm) Weight: 191 lb 1 oz (86.7 kg) IBW/kg (Calculated) : 63.9  Temp (24hrs), Avg:98.4 F (36.9 C), Min:97.8 F (36.6 C), Max:99.3 F (37.4 C)   Recent Labs Lab 12/02/16 1539 12/03/16 0431 12/05/16 0600 12/05/16 0653  WBC 9.3 9.3  --  7.3  CREATININE 1.13* 0.91 0.66  --     Estimated Creatinine Clearance: 77.6 mL/min (by C-G formula based on SCr of 0.66 mg/dL).    Allergies  Allergen Reactions  . Penicillins Hives    Childhood allergy could have been more reaction only remembers hives.     Antimicrobials this admission: vancomycin 12/112 >>  cefepime 12/12 >>   Dose adjustments this admission:   Microbiology results: 12/12 BCx: staph species, sensitivities pending  Thank you for allowing pharmacy to be a part of this patient's care.  Shantanu Strauch D, Pharm.D, BCPS Clinical Pharmacist 12/05/2016 10:27 AM

## 2016-12-05 NOTE — Transfer of Care (Signed)
Immediate Anesthesia Transfer of Care Note  Patient: Laurie [name]  Procedure(s) Performed: Procedure(s): IRRIGATION AND DEBRIDEMENT FOOT (Left)  Patient Location: PACU  Anesthesia Type:General  Level of Consciousness: awake, oriented and patient cooperative  Airway & Oxygen Therapy: Patient Spontanous Breathing and Patient connected to face mask oxygen  Post-op Assessment: Report given to RN, Post -op Vital signs reviewed and stable and Patient moving all extremities X 4  Post vital signs: Reviewed and stable  Last Vitals:  Vitals:   12/05/16 1132 12/05/16 1539  BP: (!) 151/64 (!) 110/59  Pulse: 76 83  Resp: 14 15  Temp: 36.6 C 36.2 C    Last Pain:  Vitals:   12/05/16 1132  TempSrc: Oral  PainSc:       Patients Stated Pain Goal: 4 (12/05/16 0801)  Complications: No apparent anesthesia complications

## 2016-12-05 NOTE — Op Note (Signed)
Operative note   Surgeon:Krystie Leiter Armed forces logistics/support/administrative officerowler    Assistant: None    Preop diagnosis: Gangrene with a necrotic left heel    Postop diagnosis: Same    Procedure: Debridement to muscle posterior left heel    EBL: 25 ML's    Anesthesia:local and general    Hemostasis: None    Specimen: Wound culture    Complications: None    Operative indications:Laurie [name] is an 70 y.o. that presents today for surgical intervention.  The risks/benefits/alternatives/complications have been discussed and consent has been given.    Procedure:  Patient was brought into the OR and placed on the operating table in the lateral position with the left side elevated. After anesthesia was obtained theleft lower extremity was prepped and draped in usual sterile fashion.  Attention was directed to the posterior aspect of the left heel where a large necrotic heel ulcer was noted on the entire plantar and posterior aspect of the left calcaneus. Full-thickness dissection of the large necrotic eschar and this was removed from the surgical field in toto. A wound culture was taken. This was taken down to subcutaneous tissue and muscle. Necrotic tissue was noted at this time and excisional debridement with a versa jet was taken down to bleeding tissue. Bleeders were Bovie cauterized as needed. The wound was flushed with copious amounts of irrigation. After final excisional debridement a wound VAC was placed on the posterior aspect of the left heel.    Patient tolerated the procedure and anesthesia well.  Was transported from the OR to the PACU with all vital signs stable and vascular status intact. Will follow up in approximately 1 week in the outpatient clinic.

## 2016-12-05 NOTE — Progress Notes (Signed)
[employer] [facility] [place] [facility]   PATIENT NAME: Laurie [name]    MR#:  [id number]  DATE OF BIRTH:  [birth year]  SUBJECTIVE:  Patient Is feeling nervous about surgery this morning.  REVIEW OF SYSTEMS:    Review of Systems  Constitutional: Negative.  Negative for chills, fever and malaise/fatigue.  HENT: Negative.  Negative for ear discharge, ear pain, hearing loss, nosebleeds and sore throat.   Eyes: Negative.  Negative for blurred vision and pain.  Respiratory: Negative.  Negative for cough, hemoptysis, shortness of breath and wheezing.   Cardiovascular: Negative.  Negative for chest pain, palpitations and leg swelling.  Gastrointestinal: Negative.  Negative for abdominal pain, blood in stool, diarrhea, nausea and vomiting.  Genitourinary: Negative.  Negative for dysuria.  Musculoskeletal: Positive for joint pain. Negative for back pain.  Skin:       Left foot draining foul smelling  Neurological: Negative for dizziness, tremors, speech change, focal weakness, seizures and headaches.  Endo/Heme/Allergies: Negative.  Does not bruise/bleed easily.  Psychiatric/Behavioral: Negative.  Negative for depression, hallucinations and suicidal ideas.    Tolerating Diet: Nothing by mouth     DRUG ALLERGIES:   Allergies  Allergen Reactions  . Penicillins Hives    Childhood allergy could have been more reaction only remembers hives.     VITALS:  Blood pressure (!) 161/71, pulse 71, temperature 98.5 F (36.9 C), temperature source Oral, resp. rate 16, height 5\' 8"  (1.727 m), weight 86.7 kg (191 lb 1 oz), SpO2 100 %.  PHYSICAL EXAMINATION:   Physical Exam  Constitutional: She is oriented to person, place, and time and well-developed, well-nourished, and in no distress. No distress.  HENT:  Head: Normocephalic.  Eyes: No scleral icterus.  Neck: Normal range of motion. Neck supple. No JVD present. No tracheal deviation present.  Cardiovascular: Normal rate,  regular rhythm and normal heart sounds.  Exam reveals no gallop and no friction rub.   No murmur heard. Pulmonary/Chest: Effort normal and breath sounds normal. No respiratory distress. She has no wheezes. She has no rales. She exhibits no tenderness.  Abdominal: Soft. Bowel sounds are normal. She exhibits no distension and no mass. There is no tenderness. There is no rebound and no guarding.  Musculoskeletal: Normal range of motion. She exhibits no edema.  Neurological: She is alert and oriented to person, place, and time.  Skin: Skin is warm. No rash noted. There is erythema.  Edema and associated large necrotic ulcer  Psychiatric: Affect and judgment normal.      LABORATORY PANEL:   CBC  Recent Labs Lab 12/05/16 0653  WBC 7.3  HGB 11.4*  HCT 33.7*  PLT 294   ------------------------------------------------------------------------------------------------------------------  Chemistries   Recent Labs Lab 12/03/16 0431 12/05/16 0600  NA 135 138  K 3.1* 3.4*  CL 96* 106  CO2 31 25  GLUCOSE 352* 188*  BUN 20 14  CREATININE 0.91 0.66  CALCIUM 8.2* 8.5*  MG 1.6*  --    ------------------------------------------------------------------------------------------------------------------  Cardiac Enzymes No results for input(s): TROPONINI in the last 168 hours. ------------------------------------------------------------------------------------------------------------------  RADIOLOGY:  Mr Ankle Left Wo Contrast  Result Date: 12/04/2016 CLINICAL DATA:  Ulceration of the left heel.  Draining wound. EXAM: MRI OF THE LEFT ANKLE WITHOUT CONTRAST TECHNIQUE: Multiplanar, multisequence MR imaging of the ankle was performed. No intravenous contrast was administered. COMPARISON:  12/02/2016 radiographs FINDINGS: TENDONS Peroneal: Unremarkable Posteromedial: Unremarkable Anterior: Unremarkable Achilles: Unremarkable Plantar Fascia: Low-level edema tracks along the plantar fascia,  without significant  thickening. LIGAMENTS Lateral: Unremarkable Medial: Unremarkable CARTILAGE Ankle Joint: Mild degenerative chondral thinning. Upper normal amount of fluid in the joint. Subtalar Joints/Sinus Tarsi: Mild degenerative chondral thinning. Bones: There is trace edema along the posterior inferior calcaneal endostium and adjacent marrow, as on image 17/7. This directly underlies a region of considerable ulceration and cutaneous defect, spanning a large portion of the posterior heel. There is edema in the subcutaneous tissues and also considerable thinning of the subcutaneous tissues overlying the periosteal margin down to about 5 mm in some points. Other: Small focal metal artifact along the skin along the plantar -lateral foot at about the level of the cuboid, image 37/4, probably some type of microscopic metallic body given the lack of visible metal density on radiography. There is diffuse dorsal subcutaneous edema tracking in the foot and low-level edema tracking in the musculature of the foot. Extensive subcutaneous edema around the ankle. IMPRESSION: 1. Large posterior heel ulceration with large cutaneous defect and considerable thinning of the subcutaneous tissues overlying the posterior inferior calcaneus, with adjacent low-level endosteal and marrow edema in the calcaneus. At this stage I suspect that this edema, being subtle, is probably reactive. The possibility that this could represent very early osteomyelitis cannot be totally excluded, but there no overt destructive findings yet. 2. Considerable edema in the ankle and foot both in the subcutaneous tissues, and also with some edema tracking within along the musculature of the ankle and foot. 3. Low-level edema tracking along the plantar fascia seemed proportionate to the rest of the edema and probably does not imply independent plantar fasciitis. 4. Mild degenerative chondral thinning in the tibiotalar joint. Mild chondral thinning in the  subtalar joints. Electronically Signed   By: Gaylyn RongWalter  Liebkemann M.D.   On: 12/04/2016 11:05     ASSESSMENT AND PLAN:    70 year old female with history of PAD recently seen by her vascular surgeon for left decubitus/necrotic heel ulcer who presents with foul smelling and painful left decubitus ulcer.  1. Diabetic left heel decubitus ulcer With edema Patient underwent angiogram which showed some tibial disease and open bypass graft   She will continue cefepime and vancomycin MRI shows possible early osteomyelitis Plan for debridement to attempt limb salvage for the large ulceration and necrotic/soft tissue infection One out of 2 blood cultures staph species coag-negative which is contaminant   2. Uncontrolled Diabetes insulin dependent worsened from infection: Increase Lantus to 28 units with sliding scale insulin and ADA diet Consider adding NovoLog 4 units 3 times a day's with meals for meal coverage if patient eats at least 50% of meals Continue sliding scale insulin As per diabetes coordinator At time of discharge, recommend MD discharge patient on insulin regimen similar to patient is receiving here at the hospital to improve glycemic control and improve wound healing 3. Essential hypertension: Continue lisinopril 4. PAD on Plavix   5. Hyperlipidemia on atorvastatin      Management plans discussed with the patient and she is in agreement.  CODE STATUS: FULL  TOTAL TIME TAKING CARE OF THIS PATIENT: 25 minutes.   D/w dr dew and daughter  POSSIBLE D/C  2-4 days, DEPENDING ON CLINICAL CONDITION.   Kalli Greenfield M.D on 12/05/2016 at 10:43 AM  Between 7am to 6pm - Pager - [phone] After 6pm go to [email or web address] - password [email or web address]  [facility] [place] [facility]  Office  [phone]  CC: Primary care physician; Lonia BloodGARBA,LAWAL, MD  Note: This dictation was prepared with Dragon dictation along with smaller phrase  technology. Any transcriptional errors that result  from this process are unintentional.

## 2016-12-05 NOTE — Anesthesia Postprocedure Evaluation (Signed)
Anesthesia Post Note  Patient: Laurie [name]  Procedure(s) Performed: Procedure(s) (LRB): IRRIGATION AND DEBRIDEMENT FOOT (Left)  Patient location during evaluation: PACU Anesthesia Type: General Level of consciousness: awake and alert Pain management: pain level controlled Vital Signs Assessment: post-procedure vital signs reviewed and stable Respiratory status: spontaneous breathing, nonlabored ventilation, respiratory function stable and patient connected to nasal cannula oxygen Cardiovascular status: blood pressure returned to baseline and stable Postop Assessment: no signs of nausea or vomiting Anesthetic complications: no    Last Vitals:  Vitals:   12/05/16 1639 12/05/16 1701  BP: 113/64 (!) 120/55  Pulse: 78 83  Resp: 13   Temp: 36.3 C 36.6 C    Last Pain:  Vitals:   12/05/16 1701  TempSrc: Oral  PainSc:                  Terence Googe S

## 2016-12-05 NOTE — Anesthesia Procedure Notes (Signed)
Procedure Name: Intubation Date/Time: 12/05/2016 2:25 PM Performed by: Michaele OfferSAVAGE, Alexiz Cothran Pre-anesthesia Checklist: Patient identified, Emergency Drugs available, Suction available, Patient being monitored and Timeout performed Patient Re-evaluated:Patient Re-evaluated prior to inductionOxygen Delivery Method: Circle system utilized Preoxygenation: Pre-oxygenation with 100% oxygen Intubation Type: IV induction Ventilation: Mask ventilation without difficulty Laryngoscope Size: Miller and 2 Grade View: Grade I Tube type: Oral Tube size: 7.0 mm Number of attempts: 1 Airway Equipment and Method: Stylet Placement Confirmation: ETT inserted through vocal cords under direct vision,  positive ETCO2 and breath sounds checked- equal and bilateral Secured at: 20 cm Tube secured with: Tape Dental Injury: Teeth and Oropharynx as per pre-operative assessment  Comments: Inserted by Ephriam KnucklesBen Logan CRNA without difficulty

## 2016-12-05 NOTE — Progress Notes (Signed)
Inpatient Diabetes Program Recommendations  AACE/ADA: New Consensus Statement on Inpatient Glycemic Control (2015)  Target Ranges:  Prepandial:   less than 140 mg/dL      Peak postprandial:   less than 180 mg/dL (1-2 hours)      Critically ill patients:  140 - 180 mg/dL   Results for [name], Laurie [name] (MRN [id number]) as of 12/05/2016 09:02  Ref. Range 12/04/2016 07:45 12/04/2016 12:00 12/04/2016 16:36 12/04/2016 21:23 12/05/2016 07:38  Glucose-Capillary Latest Ref Range: 65 - 99 mg/dL 875191 (H) 643220 (H) 329264 (H) 236 (H) 184 (H)   Review of Glycemic Control  Diabetes history: DM2 Outpatient Diabetes medications: Lantus 20 units QHS Current orders for Inpatient glycemic control: Lantus 26 units QHS, Novolog 0-9 units TID with meals, Novolog 0-5 units QHS  Inpatient Diabetes Program Recommendations: Insulin - Basal: Please consider increasing Lantus to 28 units QHS. Insulin - Meal Coverage: If patient eats at least 50% of meals, please order Novolog 4 units TID with meals for meal coverage. A1C: A1C 14.5% on 12/03/16 indicating an average glucose of 369 mg/dl over the past 2-3 months. Outpatient DM medication regimen: At time of discharge, recommend MD discharge patient on insulin regimen similar to patient is receiving here at the hospital to improve glycemic control and improve wound healing.  NOTE: Diabetes Coordinator Raynelle Fanning(Julie AkeleyMontpellier, RN, CDE) spoke with patient on 12/03/16 and patient confirmed she was only ordered Lantus 20 units qhs for blood sugar management but stated "they don't always give it to me" at the facility. Patient needs changes made with DM medication regimen to improve glycemic control.  Thanks, Orlando PennerMarie Nainika Newlun, RN, MSN, CDE Diabetes Coordinator Inpatient Diabetes Program [phone] (Team Pager from 8am to 5pm)

## 2016-12-05 NOTE — Clinical Social Work Note (Signed)
MSW received phone call from Rosey Batheresa from [facility] [phone], they have spoken to patient's daughter who would like patient to transfer to [facility] as a long term care resident.  MSW spoke to patient's daughter yesterday and she chose [facility] as the SNF of choice.  Patient is originally from [facility] as a long term care resident.  MSW to continue to follow patient's progress throughout discharge planning.  Ervin KnackEric R. Hassan Rowannterhaus, MSW [phone]  Mon-Fri 8a-4:30p 12/05/2016 5:28 PM

## 2016-12-06 LAB — GLUCOSE, CAPILLARY
GLUCOSE-CAPILLARY: 201 mg/dL — AB (ref 65–99)
Glucose-Capillary: 241 mg/dL — ABNORMAL HIGH (ref 65–99)
Glucose-Capillary: 246 mg/dL — ABNORMAL HIGH (ref 65–99)
Glucose-Capillary: 306 mg/dL — ABNORMAL HIGH (ref 65–99)

## 2016-12-06 LAB — BASIC METABOLIC PANEL
Anion gap: 5 (ref 5–15)
BUN: 13 mg/dL (ref 6–20)
CO2: 25 mmol/L (ref 22–32)
Calcium: 8.1 mg/dL — ABNORMAL LOW (ref 8.9–10.3)
Chloride: 108 mmol/L (ref 101–111)
Creatinine, Ser: 0.87 mg/dL (ref 0.44–1.00)
GFR calc Af Amer: >60 mL/min (ref >60)
GFR calc non Af Amer: >60 mL/min (ref >60)
Glucose, Bld: 212 mg/dL — ABNORMAL HIGH (ref 65–99)
Potassium: 3.9 mmol/L (ref 3.5–5.1)
Sodium: 138 mmol/L (ref 135–145)

## 2016-12-06 LAB — CBC
HCT: 31.7 % — ABNORMAL LOW (ref 35.0–47.0)
Hemoglobin: 10.8 g/dL — ABNORMAL LOW (ref 12.0–16.0)
MCH: 31 pg (ref 26.0–34.0)
MCHC: 33.9 g/dL (ref 32.0–36.0)
MCV: 91.4 fL (ref 80.0–100.0)
Platelets: 281 10*3/uL (ref 150–440)
RBC: 3.47 MIL/uL — ABNORMAL LOW (ref 3.80–5.20)
RDW: 12.4 % (ref 11.5–14.5)
WBC: 8 10*3/uL (ref 3.6–11.0)

## 2016-12-06 MED ORDER — INSULIN GLARGINE 100 UNIT/ML ~~LOC~~ SOLN
28.0000 [IU] | Freq: Every day | SUBCUTANEOUS | Status: DC
  Administered 2016-12-06 – 2016-12-07 (×2): 28 [IU] via SUBCUTANEOUS
  Filled 2016-12-06 (×3): qty 0.28

## 2016-12-06 NOTE — Progress Notes (Addendum)
[facility] -  at [facility]   PATIENT NAME: Laurie [name]    MR#:  [id number]  DATE OF BIRTH:  Feb 13, 1946  SUBJECTIVE:  Pt feeling better, no chest pain   REVIEW OF SYSTEMS:    Review of Systems  Constitutional: Negative.  Negative for chills, fever and malaise/fatigue.  HENT: Negative.  Negative for ear discharge, ear pain, hearing loss, nosebleeds and sore throat.   Eyes: Negative.  Negative for blurred vision and pain.  Respiratory: Negative.  Negative for cough, hemoptysis, shortness of breath and wheezing.   Cardiovascular: Negative.  Negative for chest pain, palpitations and leg swelling.  Gastrointestinal: Negative.  Negative for abdominal pain, blood in stool, diarrhea, nausea and vomiting.  Genitourinary: Negative.  Negative for dysuria.  Musculoskeletal: Positive for joint pain. Negative for back pain.  Skin:       Left foot  Wound vac in place  Neurological: Negative for dizziness, tremors, speech change, focal weakness, seizures and headaches.  Endo/Heme/Allergies: Negative.  Does not bruise/bleed easily.  Psychiatric/Behavioral: Negative.  Negative for depression, hallucinations and suicidal ideas.    Tolerating Diet: Nothing by mouth     DRUG ALLERGIES:   Allergies  Allergen Reactions  . Penicillins Hives    Childhood allergy could have been more reaction only remembers hives.     VITALS:  Blood pressure (!) 131/54, pulse 76, temperature 97.7 F (36.5 C), temperature source Oral, resp. rate 18, height 5\' 8"  (1.727 m), weight 191 lb 1 oz (86.7 kg), SpO2 99 %.  PHYSICAL EXAMINATION:   Physical Exam  Constitutional: She is oriented to person, place, and time and well-developed, well-nourished, and in no distress. No distress.  HENT:  Head: Normocephalic.  Eyes: No scleral icterus.  Neck: Normal range of motion. Neck supple. No JVD present. No tracheal deviation present.  Cardiovascular: Normal rate, regular rhythm and normal  heart sounds.  Exam reveals no gallop and no friction rub.   No murmur heard. Pulmonary/Chest: Effort normal and breath sounds normal. No respiratory distress. She has no wheezes. She has no rales. She exhibits no tenderness.  Abdominal: Soft. Bowel sounds are normal. She exhibits no distension and no mass. There is no tenderness. There is no rebound and no guarding.  Musculoskeletal: Normal range of motion. She exhibits no edema.  Neurological: She is alert and oriented to person, place, and time.  Skin: Skin is warm. No rash noted. There is erythema.  Wound VAC in place   Psychiatric: Affect and judgment normal.      LABORATORY PANEL:   CBC  Recent Labs Lab 12/06/16 0526  WBC 8.0  HGB 10.8*  HCT 31.7*  PLT 281   ------------------------------------------------------------------------------------------------------------------  Chemistries   Recent Labs Lab 12/03/16 0431  12/06/16 0526  NA 135  < > 138  K 3.1*  < > 3.9  CL 96*  < > 108  CO2 31  < > 25  GLUCOSE 352*  < > 212*  BUN 20  < > 13  CREATININE 0.91  < > 0.87  CALCIUM 8.2*  < > 8.1*  MG 1.6*  --   --   < > = values in this interval not displayed. ------------------------------------------------------------------------------------------------------------------  Cardiac Enzymes No results for input(s): TROPONINI in the last 168 hours. ------------------------------------------------------------------------------------------------------------------  RADIOLOGY:  No results found.   ASSESSMENT AND PLAN:    70 year old female with history of PAD recently seen by her vascular surgeon for left decubitus/necrotic heel ulcer who presents with foul smelling  and painful left decubitus ulcer.  1. 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  Uncontrolled Diabetes insulin dependent worsened from infection: Increase Lantus 28 units with sliding scale insulin and ADA diet Continue ssi  3. Essential hypertension: Continue lisinopril  4. PAD on Plavix   5. Hyperlipidemia on atorvastatin      CODE STATUS: FULL  TOTAL TIME TAKING CARE OF THIS PATIENT: 25 minutes.   D/w dr dew and daughter  POSSIBLE D/C  2-4 days, DEPENDING ON CLINICAL CONDITION.   Auburn BilberryPATEL, Joee Iovine M.D on 12/06/2016 at 1:08 PM  Between 7am to 6pm - Pager - [phone] After 6pm go to [email or web address] - password [facility]  [facility] [place] [facility]  Office  [phone]  CC: Primary care physician; Lonia BloodGARBA,LAWAL, MD  Note: This dictation was prepared with Dragon dictation along with smaller phrase technology. Any transcriptional errors that result from this process are unintentional.

## 2016-12-06 NOTE — Progress Notes (Signed)
There was a leak on the wound vac. Re-enforced with thin film and wrapped with stretch bandage. Wound vac  is now working effectively. No acute distress noted. Will continue to monitor.

## 2016-12-06 NOTE — Progress Notes (Signed)
Daily Progress Note   Subjective  - 1 Day Post-Op  F/u left heel debridment.  No heel pain.  Some spasms in leg  Objective Vitals:   12/05/16 1940 12/06/16 0610 12/06/16 0619 12/06/16 0624  BP: (!) 150/72 (!) 178/71 (!) 172/86 (!) 144/62  Pulse: (!) 53 85 85 86  Resp: 18 18    Temp: 98.1 F (36.7 C) 98.9 F (37.2 C)    TempSrc: Oral Oral    SpO2: 93% 94%    Weight:      Height:        Physical Exam: Wound vac in place.  Did not remove.  Laboratory CBC    Component Value Date/Time   WBC 8.0 12/06/2016 0526   HGB 10.8 (L) 12/06/2016 0526   HCT 31.7 (L) 12/06/2016 0526   PLT 281 12/06/2016 0526    BMET    Component Value Date/Time   NA 138 12/06/2016 0526   K 3.9 12/06/2016 0526   CL 108 12/06/2016 0526   CO2 25 12/06/2016 0526   GLUCOSE 212 (H) 12/06/2016 0526   BUN 13 12/06/2016 0526   CREATININE 0.87 12/06/2016 0526   CALCIUM 8.1 (L) 12/06/2016 0526   GFRNONAA >60 12/06/2016 0526   GFRAA >60 12/06/2016 0526    Assessment/Planning: Heel necrotic ulcer.   Will change vac tomorrow.  Suspect can be d/c'd after that.  Will need wound vac upon d/c.    [name], Solara Goodchild A  12/06/2016, 7:56 AM

## 2016-12-07 LAB — GLUCOSE, CAPILLARY
GLUCOSE-CAPILLARY: 222 mg/dL — AB (ref 65–99)
GLUCOSE-CAPILLARY: 256 mg/dL — AB (ref 65–99)
Glucose-Capillary: 197 mg/dL — ABNORMAL HIGH (ref 65–99)
Glucose-Capillary: 268 mg/dL — ABNORMAL HIGH (ref 65–99)

## 2016-12-07 LAB — CULTURE, BLOOD (ROUTINE X 2): CULTURE: NO GROWTH

## 2016-12-07 LAB — VANCOMYCIN, TROUGH: VANCOMYCIN TR: 18 ug/mL (ref 15–20)

## 2016-12-07 MED ORDER — HYDRALAZINE HCL 20 MG/ML IJ SOLN
10.0000 mg | Freq: Four times a day (QID) | INTRAMUSCULAR | Status: DC | PRN
  Administered 2016-12-07: 10 mg via INTRAVENOUS
  Filled 2016-12-07: qty 1

## 2016-12-07 MED ORDER — DOXYCYCLINE HYCLATE 100 MG PO TABS
100.0000 mg | ORAL_TABLET | Freq: Two times a day (BID) | ORAL | Status: DC
  Administered 2016-12-07 – 2016-12-08 (×2): 100 mg via ORAL
  Filled 2016-12-07 (×2): qty 1

## 2016-12-07 NOTE — Progress Notes (Signed)
Daily Progress Note   Subjective  - 2 Days Post-Op  F/u heel debridement.  Objective Vitals:   12/06/16 0822 12/06/16 1108 12/06/16 1930 12/07/16 0354  BP: (!) 162/68 (!) 131/54 (!) 165/61 (!) 154/60  Pulse: 86 76 81 73  Resp:  18 18 18   Temp: 98.4 F (36.9 C) 97.7 F (36.5 C) 98.7 F (37.1 C) 98.2 F (36.8 C)  TempSrc: Oral Oral Oral Oral  SpO2: 90% 99% 96% 94%  Weight:      Height:        Physical Exam:   Ulcer is 9x7 cm.Heel with mostly granular tissue and adipose.  No infection.  No necrosis.  Laboratory CBC    Component Value Date/Time   WBC 8.0 12/06/2016 0526   HGB 10.8 (L) 12/06/2016 0526   HCT 31.7 (L) 12/06/2016 0526   PLT 281 12/06/2016 0526    BMET    Component Value Date/Time   NA 138 12/06/2016 0526   K 3.9 12/06/2016 0526   CL 108 12/06/2016 0526   CO2 25 12/06/2016 0526   GLUCOSE 212 (H) 12/06/2016 0526   BUN 13 12/06/2016 0526   CREATININE 0.87 12/06/2016 0526   CALCIUM 8.1 (L) 12/06/2016 0526   GFRNONAA >60 12/06/2016 0526   GFRAA >60 12/06/2016 0526    Assessment/Planning: Wound vac changed   Will need wound vac upon d/c.  Continue to f/u with wound care center.  F/U with me in 1 month.  Use offloading heel boot at all times.    [name], Hoy Fallert A  12/07/2016, 9:33 AM

## 2016-12-07 NOTE — Progress Notes (Signed)
[employer] [facility] - [place] at [facility]   PATIENT NAME: Laurie [name]    MR#:  [id number]  DATE OF BIRTH:  09/14/1946  SUBJECTIVE:  No complatins  REVIEW OF SYSTEMS:    Review of Systems  Constitutional: Negative.  Negative for chills, fever and malaise/fatigue.  HENT: Negative.  Negative for ear discharge, ear pain, hearing loss, nosebleeds and sore throat.   Eyes: Negative.  Negative for blurred vision and pain.  Respiratory: Negative.  Negative for cough, hemoptysis, shortness of breath and wheezing.   Cardiovascular: Negative.  Negative for chest pain, palpitations and leg swelling.  Gastrointestinal: Negative.  Negative for abdominal pain, blood in stool, diarrhea, nausea and vomiting.  Genitourinary: Negative.  Negative for dysuria.  Musculoskeletal: Negative for back pain and joint pain.  Skin:       Left foot  Wound vac in place  Neurological: Negative for dizziness, tremors, speech change, focal weakness, seizures and headaches.  Endo/Heme/Allergies: Negative.  Does not bruise/bleed easily.  Psychiatric/Behavioral: Negative.  Negative for depression, hallucinations and suicidal ideas.    Tolerating Diet: Nothing by mouth     DRUG ALLERGIES:   Allergies  Allergen Reactions  . Penicillins Hives    Childhood allergy could have been more reaction only remembers hives.     VITALS:  Blood pressure (!) 165/59, pulse 78, temperature 98.6 F (37 C), temperature source Oral, resp. rate 18, height 5\' 8"  (1.727 m), weight 191 lb 1 oz (86.7 kg), SpO2 97 %.  PHYSICAL EXAMINATION:   Physical Exam  Constitutional: She is oriented to person, place, and time and well-developed, well-nourished, and in no distress. No distress.  HENT:  Head: Normocephalic.  Eyes: No scleral icterus.  Neck: Normal range of motion. Neck supple. No JVD present. No tracheal deviation present.  Cardiovascular: Normal rate, regular rhythm and normal heart sounds.  Exam reveals no  gallop and no friction rub.   No murmur heard. Pulmonary/Chest: Effort normal and breath sounds normal. No respiratory distress. She has no wheezes. She has no rales. She exhibits no tenderness.  Abdominal: Soft. Bowel sounds are normal. She exhibits no distension and no mass. There is no tenderness. There is no rebound and no guarding.  Musculoskeletal: Normal range of motion. She exhibits no edema.  Neurological: She is alert and oriented to person, place, and time.  Skin: Skin is warm. No rash noted. No erythema.  Wound VAC in place   Psychiatric: Affect and judgment normal.      LABORATORY PANEL:   CBC  Recent Labs Lab 12/06/16 0526  WBC 8.0  HGB 10.8*  HCT 31.7*  PLT 281   ------------------------------------------------------------------------------------------------------------------  Chemistries   Recent Labs Lab 12/03/16 0431  12/06/16 0526  NA 135  < > 138  K 3.1*  < > 3.9  CL 96*  < > 108  CO2 31  < > 25  GLUCOSE 352*  < > 212*  BUN 20  < > 13  CREATININE 0.91  < > 0.87  CALCIUM 8.2*  < > 8.1*  MG 1.6*  --   --   < > = values in this interval not displayed. ------------------------------------------------------------------------------------------------------------------  Cardiac Enzymes No results for input(s): TROPONINI in the last 168 hours. ------------------------------------------------------------------------------------------------------------------  RADIOLOGY:  No results found.   ASSESSMENT AND PLAN:    70 year old female with history of PAD recently seen by her vascular surgeon for left decubitus/necrotic heel ulcer who presents with foul smelling and painful left decubitus ulcer.  1.  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 Uncontrolled Diabetes insulin dependent worsened  from infection: Increase Lantus 28 units with sliding scale insulin and ADA diet Continue ssi  3. Essential hypertension: Continue lisinopril  4. PAD on Plavix   5. Hyperlipidemia on atorvastatin      CODE STATUS: FULL  TOTAL TIME TAKING CARE OF THIS PATIENT: 25 minutes.   D/w dr dew and daughter  POSSIBLE D/C  2-4 days, DEPENDING ON CLINICAL CONDITION.   Auburn BilberryPATEL, Travin Marik M.D on 12/07/2016 at 5:08 PM  Between 7am to 6pm - Pager - [phone] After 6pm go to [email or web address] - password [facility] [email or web address]  [facility] [place] [facility]  Office  [phone]  CC: Primary care physician; Lonia BloodGARBA,LAWAL, MD  Note: This dictation was prepared with Dragon dictation along with smaller phrase technology. Any transcriptional errors that result from this process are unintentional.

## 2016-12-08 ENCOUNTER — Encounter: Payer: Self-pay | Admitting: Podiatry

## 2016-12-08 LAB — GLUCOSE, CAPILLARY
GLUCOSE-CAPILLARY: 190 mg/dL — AB (ref 65–99)
Glucose-Capillary: 203 mg/dL — ABNORMAL HIGH (ref 65–99)

## 2016-12-08 LAB — AEROBIC CULTURE W GRAM STAIN (SUPERFICIAL SPECIMEN): Gram Stain: NONE SEEN

## 2016-12-08 MED ORDER — SENNOSIDES-DOCUSATE SODIUM 8.6-50 MG PO TABS: 1.0000 | ORAL_TABLET | Freq: Every evening | ORAL | Status: AC | PRN

## 2016-12-08 MED ORDER — ALPRAZOLAM 0.25 MG PO TABS: ORAL_TABLET | ORAL | 2 refills | Status: DC

## 2016-12-08 MED ORDER — ENOXAPARIN SODIUM 40 MG/0.4ML ~~LOC~~ SOLN: 40.0000 mg | SUBCUTANEOUS | Status: DC

## 2016-12-08 MED ORDER — CEPHALEXIN 500 MG PO CAPS: 500.0000 mg | ORAL_CAPSULE | Freq: Four times a day (QID) | ORAL | 0 refills | Status: AC

## 2016-12-08 MED ORDER — HYDROCODONE-ACETAMINOPHEN 5-325 MG PO TABS: 1.0000 | ORAL_TABLET | ORAL | 0 refills | Status: DC | PRN

## 2016-12-08 MED ORDER — DOXYCYCLINE HYCLATE 100 MG PO TABS: 100.0000 mg | ORAL_TABLET | Freq: Two times a day (BID) | ORAL | 0 refills | Status: DC

## 2016-12-08 NOTE — Progress Notes (Signed)
Patient is alert and oriented, vss, no complaints of pain.  Disconnected wound vac and taped up for transport.  Called report to [facility].  Being transferred by EMS

## 2016-12-08 NOTE — Clinical Social Work Note (Addendum)
CSW received phone call from [facility], who said they can accept patient today once paperwork has been completed.  Patient to be [facility].  Patient and family agreeable to plans will transport via ems RN to call report to [phone].  Windell MouldingEric Eaven Schwager, MSW, LCSWA Mon-Fri 8a-4:30p [phone]

## 2016-12-08 NOTE — Care Management Important Message (Signed)
Important Message  Patient Details  Name: Druscilla Brownienne W Brittingham MRN: [id number] Date of Birth: 1946/03/03   Medicare Important Message Given:  Yes    Eber HongGreene, Nann R, RN 12/08/2016, 9:13 AM

## 2016-12-08 NOTE — Discharge Instructions (Addendum)
[facility] - [place] at [facility]  DIET:  Cardiac diet, diabetic diet  DISCHARGE CONDITION:  Stable  ACTIVITY:  Activity as tolerated, with space boot all time  OXYGEN:  Home Oxygen: No.   Oxygen Delivery: room air  DISCHARGE LOCATION:  nursing home    ADDITIONAL DISCHARGE INSTRUCTION: Left heel ulceration. Apply allevyn heel ulcer dressing every other day. Continue wound vac will need f/u at wound vac   If you experience worsening of your admission symptoms, develop shortness of breath, life threatening emergency, suicidal or homicidal thoughts you must seek medical attention immediately by calling 911 or calling your MD immediately  if symptoms less severe.  You Must read complete instructions/literature along with all the possible adverse reactions/side effects for all the Medicines you take and that have been prescribed to you. Take any new Medicines after you have completely understood and accpet all the possible adverse reactions/side effects.   Please note  You were cared for by a hospitalist during your hospital stay. If you have any questions about your discharge medications or the care you received while you were in the hospital after you are discharged, you can call the unit and asked to speak with the hospitalist on call if the hospitalist that took care of you is not available. Once you are discharged, your primary care physician will handle any further medical issues. Please note that NO REFILLS for any discharge medications will be authorized once you are discharged, as it is imperative that you return to your primary care physician (or establish a relationship with a primary care physician if you do not have one) for your aftercare needs so that they can reassess your need for medications and monitor your lab values.

## 2016-12-08 NOTE — Evaluation (Signed)
Physical Therapy Evaluation Patient Details Name: Laurie [name] MRN: [id number] DOB: 1946/07/11 Today's Date: 12/08/2016   History of Present Illness  presented to ER from LTC secondary to L heel wound, noted purulent drainage and foul odor; wound failed conservative management.  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  Clinical Impression  Upon evaluation, patient alert and oriented; follows simple commands.  Very hyperverbal; requiring frequent redirection to task throughout session.  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  Denies sensory deficit throughout all extremities; minimal/no pain reported in L heel (prevalon boot donned in bed). Currently requiring min assist for bed mobility; close sup lateral scooting edge of bed.  L LE maintained in NWBing throughout evaluation, as patient offloading shoe not present in room (not stocked within hospital).  Able to complete some degree of scooting edge of bed with L LE in NWB (indicative of good UB and trunk strength/control), but unable to fully clear buttocks.  Educated patient on WBing restrictions; patient voiced understanding and awareness, but question full comprehension. Would benefit from skilled PT to address above deficits and promote optimal return to PLOF; recommend transition to STR upon discharge from acute hospitalization.  Of note, recommend patient have desired L foot shoewear prior to discharge to STR for optimal adhrence to L LE WBing restrictions.  CSW, RNCM and RN informed/aware of need for offloading shoe to L LE.  Attempted to phone Dr. Ether GriffinsFowler to discuss; no answer received on phone.    Follow Up Recommendations SNF    Equipment Recommendations       Recommendations for Other Services       Precautions / Restrictions Precautions Precautions: Fall Restrictions Weight  Bearing Restrictions: Yes Other Position/Activity Restrictions: L heel offloading shoe/boot at all times      Mobility  Bed Mobility Overal bed mobility: Needs Assistance Bed Mobility: Supine to Sit;Sit to Supine     Supine to sit: Modified independent (Device/Increase time) Sit to supine: Min assist   General bed mobility comments: assist to elevate L LE over edge of bed with sit to supine  Transfers Overall transfer level: Needs assistance               General transfer comment: scooting edge of bed, L LE NWB (boot not available during session), close sup.  Fair UB strength, good trunk control required for scooting/transfers.  Ambulation/Gait             General Gait Details: Unsafe/unable due to L LE WBing restrictions and lack of appropriate offloading shoe  Stairs            Wheelchair Mobility    Modified Rankin (Stroke Patients Only)       Balance Overall balance assessment: Needs assistance Sitting-balance support: Feet supported;No upper extremity supported Sitting balance-Leahy Scale: Good                                       Pertinent Vitals/Pain Pain Assessment: No/denies pain    Home Living Family/patient expects to be discharged to:: Skilled nursing facility                 Additional [facility]    Prior Function Level of Independence: Independent with assistive device(s)  Comments: Wc level as primary mobility; sup/mod indep for basic transfers between seating surfaces (stand/squat pivot)     Hand Dominance        Extremity/Trunk Assessment   Upper Extremity Assessment Upper Extremity Assessment:  (R UE grossly WFL; L UE grossly 3-/5 (history of previous CVA))    Lower Extremity Assessment Lower Extremity Assessment:  (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  Denies sensory deficit.)       Communication    Communication: No difficulties  Cognition Arousal/Alertness: Awake/alert Behavior During Therapy: WFL for tasks assessed/performed Overall Cognitive Status: Within Functional Limits for tasks assessed                 General Comments: very hyperverbal and [facility]; frequent redirection required    General Comments      Exercises Other Exercises Other Exercises: Educated regarding L LE WBing restrictions (NWB without footwear), forefoot WBing only (with transfers, offloading shoe at all times).  Patient voiced understanding; question full comprehension of education. Other Exercises: Lateral scooting edge of bed, close sup; unable to fully unweight buttocks from bed surface due to L LE WBing restrictions   Assessment/Plan    PT Assessment Patient needs continued PT services  PT Problem List Decreased range of motion;Decreased activity tolerance;Decreased balance;Decreased strength;Decreased mobility;Decreased knowledge of precautions;Decreased knowledge of use of DME;Decreased safety awareness;Decreased skin integrity          PT Treatment Interventions DME instruction;Functional mobility training;Therapeutic activities;Therapeutic exercise;Balance training;Neuromuscular re-education;Cognitive remediation;Patient/family education    PT Goals (Current goals can be found in the Care Plan section)  Acute Rehab PT Goals Patient Stated Goal: to sit up for a little while PT Goal Formulation: With patient Time For Goal Achievement: 12/22/16 Potential to Achieve Goals: Good Additional Goals Additional Goal #1: 100% comprehension and adherence to L LE WBing restrictions as appropriate    Frequency Min 2X/week   Barriers to discharge Decreased caregiver support      Co-evaluation               End of Session Equipment Utilized During Treatment: Gait belt Activity Tolerance: Patient tolerated treatment well Patient left: in bed;with  call bell/phone within reach;with bed alarm set Nurse Communication: Mobility status (Need for appropriate footwear to L LE prior to WBing/transfer attempts (need to have prior to discharge))         Time: 1120-1152 PT Time Calculation (min) (ACUTE ONLY): 32 min   Charges:   PT Evaluation $PT Eval Moderate Complexity: 1 Procedure PT Treatments $Therapeutic Activity: 8-22 mins   PT G Codes:        Andora Krull H. Manson PasseyBrown, PT, DPT, NCS 12/08/16, 3:57 PM [phone]

## 2016-12-08 NOTE — Discharge Summary (Addendum)
[facility] [place] [facility], 70 y.o., DOB [birth year], MRN [id number]. 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 female with a known history of PAD status post left femoral to posterior tibial bypass, Diabetes right BKA, CVA and essential hypertension who presents [facility] with large left heel ulcer with purulent discharge and foul smelling. She was admitted to the hospital and was seen by vascular surgery as well as podiatry. She underwent debridement to the Musser posterior left heel and the wound VAC was placed patient was placed on IV antibiotics. Cultures showed strep B agalactiae. Patient did have positive blood cultures likely due to contamination with coag-negative staph. Patient has been afebrile. She is been cleared by podiatry to be  Discharged.   She will need wound VAC in place with follow-up with wound clinic. Patient will need 2 weeks total of oral antibiotic with keflex from today's date. She will also need space boot whenever she ambulates.             Consults  Podiatry and vascular surgery  Significant Tests:  See full reports for all details     Mr Ankle Left Wo Contrast  Result Date: 12/04/2016 CLINICAL DATA:  Ulceration of the left heel.  Draining wound. EXAM: MRI OF THE LEFT ANKLE WITHOUT CONTRAST TECHNIQUE: Multiplanar,  multisequence MR imaging of the ankle was performed. No intravenous contrast was administered. COMPARISON:  12/02/2016 radiographs FINDINGS: TENDONS Peroneal: Unremarkable Posteromedial: Unremarkable Anterior: Unremarkable Achilles: Unremarkable Plantar Fascia: Low-level edema tracks along the plantar fascia, without significant thickening. LIGAMENTS Lateral: Unremarkable Medial: Unremarkable CARTILAGE Ankle Joint: Mild degenerative chondral thinning. Upper normal amount of fluid in the joint. Subtalar Joints/Sinus Tarsi: Mild degenerative chondral thinning. Bones: There is trace edema along the posterior inferior calcaneal endostium and adjacent marrow, as on image 17/7. This directly underlies a region of considerable ulceration and cutaneous defect, spanning a large portion of the posterior heel. There is edema in the subcutaneous tissues and also considerable thinning of the subcutaneous tissues overlying the periosteal margin down to about 5 mm in some points. Other: Small focal metal artifact along the skin along the plantar -lateral foot at about the level of the cuboid, image 37/4, probably some type of microscopic metallic body given the lack of visible metal density on radiography. There is diffuse dorsal subcutaneous edema tracking in the foot and low-level edema tracking in the musculature of the foot. Extensive subcutaneous edema around the ankle. IMPRESSION: 1. Large posterior heel ulceration with large cutaneous defect and considerable thinning of the subcutaneous tissues overlying the posterior inferior calcaneus, with adjacent low-level endosteal and marrow edema in the calcaneus. At this stage I suspect that this edema, being subtle, is probably reactive. The possibility that this could represent very early osteomyelitis cannot be totally excluded, but there no overt destructive findings yet. 2. Considerable edema in the ankle and foot both in the subcutaneous tissues, and also with some edema  tracking within along the musculature  of the ankle and foot. 3. Low-level edema tracking along the plantar fascia seemed proportionate to the rest of the edema and probably does not imply independent plantar fasciitis. 4. Mild degenerative chondral thinning in the tibiotalar joint. Mild chondral thinning in the subtalar joints. Electronically Signed   By: Gaylyn RongWalter  Liebkemann M.D.   On: 12/04/2016 11:05   Dg Foot Complete Left  Result Date: 12/02/2016 CLINICAL DATA:  Open wound. EXAM: LEFT FOOT - COMPLETE 3+ VIEW COMPARISON:  08/30/2009. FINDINGS: Small radiopacity is noted over the medial left foot. These could represent foreign bodies. Diffuse soft tissue swelling. No acute or focal bony abnormality identified. IMPRESSION: Small radiopacity is noted over the medial left foot. These could represent small foreign bodies. Diffuse soft tissue swelling noted. No acute bony or joint abnormality identified. Electronically Signed   By: Maisie Fushomas  Register   On: 12/02/2016 16:14       Today   Subjective:   Laurie [name] patient has no complaint doing well.  Objective:   Blood pressure (!) 157/67, pulse 81, temperature 98.2 F (36.8 C), temperature source Oral, resp. rate 20, height 5\' 8"  (1.727 m), weight 191 lb 1 oz (86.7 kg), SpO2 96 %.  .  Intake/Output Summary (Last 24 hours) at 12/08/16 1326 Last data filed at 12/08/16 0951  Gross per 24 hour  Intake              530 ml  Output              100 ml  Net              430 ml    Exam VITAL SIGNS: Blood pressure (!) 157/67, pulse 81, temperature 98.2 F (36.8 C), temperature source Oral, resp. rate 20, height 5\' 8"  (1.727 m), weight 191 lb 1 oz (86.7 kg), SpO2 96 %.  GENERAL:  70 y.o.-year-old patient lying in the bed with no acute distress.  EYES: Pupils equal, round, reactive to light and accommodation. No scleral icterus. Extraocular muscles intact.  HEENT: Head atraumatic, normocephalic. Oropharynx and nasopharynx clear.  NECK:  Supple, no  jugular venous distention. No thyroid enlargement, no tenderness.  LUNGS: Normal breath sounds bilaterally, no wheezing, rales,rhonchi or crepitation. No use of accessory muscles of respiration.  CARDIOVASCULAR: S1, S2 normal. No murmurs, rubs, or gallops.  ABDOMEN: Soft, nontender, nondistended. Bowel sounds present. No organomegaly or mass.  EXTREMITIES: Left heel with one VAC in place , right AKA NEUROLOGIC: Cranial nerves II through XII are intact. Muscle strength 5/5 in all extremities. Sensation intact. Gait not checked.  PSYCHIATRIC: The patient is alert and oriented x 3.  SKIN: No obvious rash, lesion, or ulcer.   Data Review     CBC w Diff:  Lab Results  Component Value Date   WBC 8.0 12/06/2016   HGB 10.8 (L) 12/06/2016   HCT 31.7 (L) 12/06/2016   PLT 281 12/06/2016   LYMPHOPCT 32 12/02/2016   MONOPCT 8 12/02/2016   EOSPCT 1 12/02/2016   BASOPCT 1 12/02/2016   CMP:  Lab Results  Component Value Date   NA 138 12/06/2016   K 3.9 12/06/2016   CL 108 12/06/2016   CO2 25 12/06/2016   BUN 13 12/06/2016   CREATININE 0.87 12/06/2016   PROT 6.5 02/23/2015   ALBUMIN 3.6 02/23/2015   BILITOT 0.5 02/23/2015   ALKPHOS 93 02/23/2015   AST 28 02/23/2015   ALT 39 (H) 02/23/2015  .  Micro Results Recent Results (from the  past 240 hour(s))  Blood culture (routine x 2)     Status: None   Collection Time: 12/02/16  3:39 PM  Result Value Ref Range Status   Specimen Description BLOOD LEFT ARM  Final   Special Requests BOTTLES DRAWN AEROBIC AND ANAEROBIC 2MLAERO,2MLANA  Final   Culture NO GROWTH 5 DAYS  Final   Report Status 12/07/2016 FINAL  Final  Blood culture (routine x 2)     Status: Abnormal   Collection Time: 12/02/16  3:39 PM  Result Value Ref Range Status   Specimen Description BLOOD LEFT HAND  Final   Special Requests BOTTLES DRAWN AEROBIC AND ANAEROBIC 2MLAERO,1MLANA  Final   Culture  Setup Time   Final    GRAM POSITIVE COCCI AEROBIC BOTTLE ONLY CRITICAL  RESULT CALLED TO, READ BACK BY AND VERIFIED WITH: MELISSA MACCIA 12/03/16 @ 1646  MLK    Culture (A)  Final    STAPHYLOCOCCUS SPECIES (COAGULASE NEGATIVE) THE SIGNIFICANCE OF ISOLATING THIS ORGANISM FROM A SINGLE SET OF BLOOD CULTURES WHEN MULTIPLE SETS ARE DRAWN IS UNCERTAIN. PLEASE NOTIFY THE MICROBIOLOGY DEPARTMENT WITHIN ONE WEEK IF SPECIATION AND SENSITIVITIES ARE REQUIRED. Performed [facility]    Report Status 12/05/2016 FINAL  Final  Blood Culture ID Panel (Reflexed)     Status: Abnormal   Collection Time: 12/02/16  3:39 PM  Result Value Ref Range Status   Enterococcus species NOT DETECTED NOT DETECTED Final   Listeria monocytogenes NOT DETECTED NOT DETECTED Final   Staphylococcus species DETECTED (A) NOT DETECTED Final    Comment: CRITICAL RESULT CALLED TO, READ BACK BY AND VERIFIED WITH: MELISSA MACCIA 12/03/16 @ 1646  MLK    Staphylococcus aureus NOT DETECTED NOT DETECTED Final   Methicillin resistance NOT DETECTED NOT DETECTED Final   Streptococcus species NOT DETECTED NOT DETECTED Final   Streptococcus agalactiae NOT DETECTED NOT DETECTED Final   Streptococcus pneumoniae NOT DETECTED NOT DETECTED Final   Streptococcus pyogenes NOT DETECTED NOT DETECTED Final   Acinetobacter baumannii NOT DETECTED NOT DETECTED Final   Enterobacteriaceae species NOT DETECTED NOT DETECTED Final   Enterobacter cloacae complex NOT DETECTED NOT DETECTED Final   Escherichia coli NOT DETECTED NOT DETECTED Final   Klebsiella oxytoca NOT DETECTED NOT DETECTED Final   Klebsiella pneumoniae NOT DETECTED NOT DETECTED Final   Proteus species NOT DETECTED NOT DETECTED Final   Serratia marcescens NOT DETECTED NOT DETECTED Final   Haemophilus influenzae NOT DETECTED NOT DETECTED Final   Neisseria meningitidis NOT DETECTED NOT DETECTED Final   Pseudomonas aeruginosa NOT DETECTED NOT DETECTED Final   Candida albicans NOT DETECTED NOT DETECTED Final   Candida glabrata NOT DETECTED NOT DETECTED  Final   Candida krusei NOT DETECTED NOT DETECTED Final   Candida parapsilosis NOT DETECTED NOT DETECTED Final   Candida tropicalis NOT DETECTED NOT DETECTED Final  MRSA PCR Screening     Status: None   Collection Time: 12/03/16  1:00 AM  Result Value Ref Range Status   MRSA by PCR NEGATIVE NEGATIVE Final    Comment:        The GeneXpert MRSA Assay (FDA approved for NASAL specimens only), is one component of a comprehensive MRSA colonization surveillance program. It is not intended to diagnose MRSA infection nor to guide or monitor treatment for MRSA infections.   Anaerobic culture     Status: None (Preliminary result)   Collection Time: 12/05/16  2:46 PM  Result Value Ref Range Status   Specimen Description WOUND  Final  Special Requests LEFT HEEL  Final   Culture   Final    HOLDING FOR POSSIBLE ANAEROBE Performed at [facility]    Report Status PENDING  Incomplete  Aerobic Culture (superficial specimen)     Status: None   Collection Time: 12/05/16  2:46 PM  Result Value Ref Range Status   Specimen Description HEEL LEFT  Final   Special Requests NONE  Final   Gram Stain   Final    NO WBC SEEN ABUNDANT GRAM POSITIVE COCCI IN PAIRS ABUNDANT GRAM NEGATIVE RODS Performed at [facility]    Culture   Final    ABUNDANT STREPTOCOCCUS AGALACTIAE TESTING AGAINST S. AGALACTIAE NOT ROUTINELY PERFORMED DUE TO PREDICTABILITY OF AMP/PEN/VAN SUSCEPTIBILITY. FEW ESCHERICHIA COLI    Report Status 12/08/2016 FINAL  Final   Organism ID, Bacteria ESCHERICHIA COLI  Final      Susceptibility   Escherichia coli - MIC*    AMPICILLIN 8 SENSITIVE Sensitive     CEFAZOLIN <=4 SENSITIVE Sensitive     CEFEPIME <=1 SENSITIVE Sensitive     CEFTAZIDIME <=1 SENSITIVE Sensitive     CEFTRIAXONE <=1 SENSITIVE Sensitive     CIPROFLOXACIN <=0.25 SENSITIVE Sensitive     GENTAMICIN <=1 SENSITIVE Sensitive     IMIPENEM <=0.25 SENSITIVE Sensitive     TRIMETH/SULFA <=20 SENSITIVE  Sensitive     AMPICILLIN/SULBACTAM <=2 SENSITIVE Sensitive     PIP/TAZO <=4 SENSITIVE Sensitive     Extended ESBL NEGATIVE Sensitive     * FEW ESCHERICHIA COLI        Code Status Orders        Start     Ordered   12/02/16 2032  Full code  Continuous     12/02/16 2032    Code Status History    Date Active Date Inactive Code Status Order ID Comments User Context   03/06/2015  6:47 PM 03/10/2015  4:36 PM Full Code [id number]  Lars Mage, PA-C Inpatient   03/05/2015  1:12 PM 03/06/2015  6:47 PM Full Code [id number]  Chuck Hint, MD Inpatient   11/01/2014  8:31 PM 11/07/2014  8:37 PM Full Code [id number]  Penny Pia, MD Inpatient   10/03/2014 10:21 AM 10/03/2014  5:18 PM Full Code [id number]  Yates Decamp, MD Inpatient   10/26/2013  5:09 PM 11/05/2013  1:40 PM Full Code [id number]  Jacquelynn Cree, PA-C Inpatient   10/25/2013  3:55 AM 10/26/2013  5:09 PM Full Code [id number]  Lynden Oxford, MD Inpatient           Contact information for follow-up providers    Festus Barren, MD. Go on 01/20/2017.   Specialties:  Vascular Surgery, Radiology, Interventional Cardiology Why:  at 3:15pm Contact information: [address] [place] [address] [phone]        [facility]             . Go on 12/29/2016.   Why:  at 9:45am Contact information: [address] [place] [phone]       Irean Hong, DPM. Go on 01/05/2017.   Specialty:  Podiatry Why:  at 10:00am Contact information: [address] [facility] [place] [place] [address] [phone]            Contact information for after-discharge care    Destination    [facility].   Specialty:  Skilled Nursing Chief of Staff information: [address] [place]  [place] [address] [phone]                  Discharge Medications   Allergies as of 12/08/2016      Reactions   Penicillins  Hives   Childhood allergy could have been more reaction only remembers hives.       Medication List    STOP taking these medications   oxyCODONE-acetaminophen 5-325 MG tablet Commonly known as:  PERCOCET/ROXICET     TAKE these medications   ALPRAZolam 0.25 MG tablet Commonly known as:  XANAX Take one tablet by mouth twice daily as needed for anxiety What changed:  how much to take  how to take this  when to take this  additional instructions   amphetamine-dextroamphetamine 10 MG tablet Commonly known as:  ADDERALL Take 10 mg by mouth 2 (two) times daily with a meal. Reported on 07/02/2016   atorvastatin 10 MG tablet Commonly known as:  LIPITOR Take 1 tablet (10 mg total) by mouth daily at 6 PM.   cephALEXin 500 MG capsule Commonly known as:  KEFLEX Take 1 capsule (500 mg total) by mouth 4 (four) times daily.   cloNIDine 0.2 MG tablet Commonly known as:  CATAPRES Take 0.2 mg by mouth 3 (three) times daily.   clopidogrel 75 MG tablet Commonly known as:  PLAVIX Take 75 mg by mouth daily.   diclofenac sodium 1 % Gel Commonly known as:  VOLTAREN Apply 4 g topically 4 (four) times daily.   DULoxetine 60 MG capsule Commonly known as:  CYMBALTA Take 60 mg by mouth daily.   enoxaparin 40 MG/0.4ML injection Commonly known as:  LOVENOX Inject 0.4 mLs (40 mg total) into the skin daily.   gabapentin 300 MG capsule Commonly known as:  NEURONTIN Take 1 capsule (300 mg total) by mouth 2 (two) times daily.   HYDROcodone-acetaminophen 5-325 MG tablet Commonly known as:  NORCO/VICODIN Take 1-2 tablets by mouth every 4 (four) hours as needed for moderate pain.   insulin glargine 100 UNIT/ML injection Commonly known as:  LANTUS Inject 0.2 mLs (20 Units total) into the skin at bedtime.   lisinopril 20 MG tablet Commonly known as:  PRINIVIL,ZESTRIL Take 1 tablet (20 mg total) by mouth daily.   oxybutynin 5 MG 24 hr tablet Commonly known as:  DITROPAN-XL Take 5 mg by  mouth at bedtime.   senna-docusate 8.6-50 MG tablet Commonly known as:  Senokot-S Take 1 tablet by mouth at bedtime as needed for mild constipation.   torsemide 100 MG tablet Commonly known as:  DEMADEX Take 100 mg by mouth daily.          Total Time in preparing paper work, data evaluation and todays exam - 35 minutes  Auburn Bilberry M.D on 12/08/2016 at 1:26 PM  [facility]   Office  [phone]

## 2016-12-08 NOTE — Care Management (Signed)
Spoke with physical therapy.  Patient is in need of a heel off loading shoe/boot prior to discharge.  This is not stocked at [facility].  Contacted podiatry and office will bring boot to patient's room and will be fitted by physical therapy

## 2016-12-09 LAB — ANAEROBIC CULTURE

## 2016-12-29 ENCOUNTER — Encounter (HOSPITAL_BASED_OUTPATIENT_CLINIC_OR_DEPARTMENT_OTHER): Payer: Medicare Other | Attending: Internal Medicine

## 2016-12-29 DIAGNOSIS — E11622 Type 2 diabetes mellitus with other skin ulcer: Secondary | ICD-10-CM | POA: Diagnosis present

## 2016-12-29 DIAGNOSIS — Z89511 Acquired absence of right leg below knee: Secondary | ICD-10-CM | POA: Diagnosis not present

## 2016-12-29 DIAGNOSIS — L97423 Non-pressure chronic ulcer of left heel and midfoot with necrosis of muscle: Secondary | ICD-10-CM | POA: Insufficient documentation

## 2016-12-29 DIAGNOSIS — E1151 Type 2 diabetes mellitus with diabetic peripheral angiopathy without gangrene: Secondary | ICD-10-CM | POA: Insufficient documentation

## 2016-12-29 DIAGNOSIS — Z923 Personal history of irradiation: Secondary | ICD-10-CM | POA: Diagnosis not present

## 2016-12-29 DIAGNOSIS — I10 Essential (primary) hypertension: Secondary | ICD-10-CM | POA: Insufficient documentation

## 2017-01-06 ENCOUNTER — Encounter: Payer: Self-pay | Admitting: Vascular Surgery

## 2017-01-08 ENCOUNTER — Encounter: Payer: Self-pay | Admitting: Vascular Surgery

## 2017-01-08 ENCOUNTER — Ambulatory Visit (INDEPENDENT_AMBULATORY_CARE_PROVIDER_SITE_OTHER): Payer: Medicare Other | Admitting: Vascular Surgery

## 2017-01-08 ENCOUNTER — Ambulatory Visit: Payer: Medicare Other | Admitting: Vascular Surgery

## 2017-01-08 VITALS — BP 148/82 | HR 98 | Temp 98.8°F | Resp 18 | Ht 68.0 in | Wt 191.0 lb

## 2017-01-08 DIAGNOSIS — I739 Peripheral vascular disease, unspecified: Secondary | ICD-10-CM | POA: Diagnosis not present

## 2017-01-08 NOTE — Progress Notes (Signed)
      HPI:  This is a 71 y.o. female who is s/p Left femoral to posterior tibial bypass with reverse great saph vein 03/06/2015.  She returns today for follow-up. She has had a previous right BKA. She is nonambulatory. 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 She has never been fitted for prosthetic on the right leg. She is fairly unstable and has had several falls. She does not complain of rest pain or ulcers in the left foot. She has chronic edema in the left leg.  She now has developed a large left heel decubitus ulcer. She is currently being followed at the wound center.   Physical Exam:    Vitals:   01/08/17 1147 01/08/17 1153  BP: (!) 146/80 (!) 148/82  Pulse: 98   Resp: 18   Temp: 98.8 F (37.1 C)   TempSrc: Oral   SpO2: 98%   Weight: 191 lb (86.6 kg)   Height: 5\' 8"  (1.727 m)    Extremities:  Left LE edema moderate seems to be dependent at this point, Chronic and unchanged 8 x 8 left heel decubitus ulcer 2 mm depth with beefy appearing granulation tissue throughout   Data: Duplex ultrasound December 2017 showed a patent left femoral to posterior tibial artery bypass graft with monophasic waveforms ABI of 1.01. 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   Assessment/Plan:   Patient with large left heel decubitus with patent bypass graft. At this point, I believe the only options would be conservative measures with offloading the heel dry dressings hopefully this will heal with time.Due to her current state of debility, she is not a candidate for further revascularization. The wound has made significant improvement and I continued to encourage her to improve her nutrition keep the heel off the bed and continue her follow-up with the wound care center.   She will follow-up with us in 6 weeks' time or sooner if worsening pain or infection   Fabienne Brunsharles Fields,  MD [facility] Office: [phone] Pager: [phone]

## 2017-01-12 DIAGNOSIS — E11622 Type 2 diabetes mellitus with other skin ulcer: Secondary | ICD-10-CM | POA: Diagnosis not present

## 2017-01-20 ENCOUNTER — Ambulatory Visit (INDEPENDENT_AMBULATORY_CARE_PROVIDER_SITE_OTHER): Payer: Medicare Other | Admitting: Vascular Surgery

## 2017-01-20 ENCOUNTER — Encounter: Payer: Self-pay | Admitting: Cardiology

## 2017-01-20 ENCOUNTER — Other Ambulatory Visit (INDEPENDENT_AMBULATORY_CARE_PROVIDER_SITE_OTHER): Payer: Self-pay | Admitting: Vascular Surgery

## 2017-01-20 ENCOUNTER — Encounter (INDEPENDENT_AMBULATORY_CARE_PROVIDER_SITE_OTHER): Payer: Self-pay | Admitting: Vascular Surgery

## 2017-01-20 ENCOUNTER — Ambulatory Visit (INDEPENDENT_AMBULATORY_CARE_PROVIDER_SITE_OTHER): Payer: Medicare Other

## 2017-01-20 VITALS — BP 147/86 | HR 105 | Resp 16 | Ht 67.0 in | Wt 191.0 lb

## 2017-01-20 DIAGNOSIS — I7025 Atherosclerosis of native arteries of other extremities with ulceration: Secondary | ICD-10-CM

## 2017-01-20 DIAGNOSIS — I70402 Unspecified atherosclerosis of autologous vein bypass graft(s) of the extremities, left leg: Secondary | ICD-10-CM

## 2017-01-20 DIAGNOSIS — E785 Hyperlipidemia, unspecified: Secondary | ICD-10-CM

## 2017-01-20 DIAGNOSIS — Z89511 Acquired absence of right leg below knee: Secondary | ICD-10-CM | POA: Diagnosis not present

## 2017-01-20 DIAGNOSIS — E08621 Diabetes mellitus due to underlying condition with foot ulcer: Secondary | ICD-10-CM | POA: Diagnosis not present

## 2017-01-20 DIAGNOSIS — L97509 Non-pressure chronic ulcer of other part of unspecified foot with unspecified severity: Secondary | ICD-10-CM

## 2017-01-21 DIAGNOSIS — I7025 Atherosclerosis of native arteries of other extremities with ulceration: Secondary | ICD-10-CM | POA: Insufficient documentation

## 2017-01-21 NOTE — Assessment & Plan Note (Signed)
lipid control important in reducing the progression of atherosclerotic disease. Continue statin therapy  

## 2017-01-21 NOTE — Assessment & Plan Note (Signed)
Her noninvasive studies today demonstrate patency of her left femoral to distal bypass and the tibial intervention below the bypass appears to be patent on the study today. Her overall perfusion to her foot is likely as good as it could be made at this point. I would not recommend any intervention or surgical therapy at this time. Podiatry can perform any procedures they see fit for her left foot ulceration. I will see her back in 6 months with noninvasive studies.

## 2017-01-21 NOTE — Progress Notes (Signed)
MRN : [id number]  Laurie [name] is a 71 y.o. (1946-11-27) female who presents with chief complaint of  Chief Complaint  Patient presents with  . Routine Post Op    4 week angio follow up  .  History of Present Illness: Patient returns today in follow up of her PVD.  Her ulcer is reported to be healing reasonably well.  It is dressed today.  She reports some continued pain in her leg although she does say it is better. She still has moderate swelling.  Current Outpatient Prescriptions  Medication Sig Dispense Refill  . ALPRAZolam (XANAX) 0.25 MG tablet Take one tablet by mouth twice daily as needed for anxiety 60 tablet 2  . amphetamine-dextroamphetamine (ADDERALL) 10 MG tablet Take 10 mg by mouth 2 (two) times daily with a meal. Reported on 07/02/2016    . ascorbic acid (VITAMIN C) 500 MG tablet Take 500 mg by mouth daily.    Marland Kitchen atorvastatin (LIPITOR) 10 MG tablet Take 1 tablet (10 mg total) by mouth daily at 6 PM. 30 tablet 1  . cloNIDine (CATAPRES) 0.2 MG tablet Take 0.2 mg by mouth 3 (three) times daily.    . clopidogrel (PLAVIX) 75 MG tablet Take 75 mg by mouth daily.    . diclofenac sodium (VOLTAREN) 1 % GEL Apply 4 g topically 4 (four) times daily. (Patient not taking: Reported on 01/08/2017) 100 g 0  . DULoxetine (CYMBALTA) 60 MG capsule Take 60 mg by mouth daily.    Marland Kitchen enoxaparin (LOVENOX) 40 MG/0.4ML injection Inject 0.4 mLs (40 mg total) into the skin daily. 0 Syringe   . gabapentin (NEURONTIN) 300 MG capsule Take 1 capsule (300 mg total) by mouth 2 (two) times daily. 60 capsule 1  . HYDROcodone-acetaminophen (NORCO/VICODIN) 5-325 MG tablet Take 1-2 tablets by mouth every 4 (four) hours as needed for moderate pain. 30 tablet 0  . insulin glargine (LANTUS) 100 UNIT/ML injection Inject 0.2 mLs (20 Units total) into the skin at bedtime. 10 mL 11  . lamoTRIgine (LAMICTAL) 25 MG tablet Take 25 mg by mouth daily.    Marland Kitchen lisinopril (PRINIVIL,ZESTRIL) 20 MG tablet Take 1 tablet (20 mg  total) by mouth daily. 30 tablet 1  . oxybutynin (DITROPAN-XL) 5 MG 24 hr tablet Take 5 mg by mouth at bedtime.    . senna-docusate (SENOKOT-S) 8.6-50 MG tablet Take 1 tablet by mouth at bedtime as needed for mild constipation.    . torsemide (DEMADEX) 100 MG tablet Take 100 mg by mouth daily.     No current facility-administered medications for this visit.     Past Medical History:  Diagnosis Date  . Anemia   . Anxiety   . Bacteremia   . Bursitis    "left hip; sometimes left shoulder" (03/05/2015)  . Chest pain    Troponins up to 0.12 while hospitalized for E.coli PNA  . Depression   . Family history of adverse reaction to anesthesia    "brother died 06/28/13; woke up w/headache, vomited blood; drilled holes in his head; bleeding stroke; never came back" (03/05/2015)  . Headache    "when someone gets on my nerves" (03/05/2015)  . High cholesterol   . Hypertension   . Narcolepsy   . 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 Stroke [facility]) 10/2014   "weaker on left side since" (03/05/2015)  . Type II diabetes mellitus ([place])   . Walking pneumonia 04/2009   "double" Archie Endo  04/23/2011    Past Surgical History:  Procedure Laterality Date  . ABDOMINAL AORTAGRAM N/A 03/05/2015   Procedure: ABDOMINAL Maxcine Ham;  Surgeon: Angelia Mould, MD;  Location: [facility] CATH LAB;  Service: Cardiovascular;  Laterality: N/A;  . AMPUTATION Right 11/03/2014   Procedure: AMPUTATION BELOW KNEE;  Surgeon: Elam Dutch, MD;  Location: [place];  Service: Vascular;  Laterality: Right;  . FEMORAL-TIBIAL BYPASS GRAFT Left 03/06/2015   Procedure: BYPASS GRAFT Left FEMORAL-POSTERIOR TIBIAL ARTERY with reversed saphenous vein;  Surgeon: Elam Dutch, MD;  Location: [place];  Service: Vascular;  Laterality: Left;  . INTRAOPERATIVE ARTERIOGRAM Left 03/06/2015   Procedure: INTRA OPERATIVE ARTERIOGRAM;  Surgeon: Elam Dutch, MD;  Location: [place];  Service: Vascular;  Laterality: Left;  .  IRRIGATION AND DEBRIDEMENT FOOT Left 12/05/2016   Procedure: IRRIGATION AND DEBRIDEMENT FOOT;  Surgeon: Samara Deist, DPM;  Location: [facility] ORS;  Service: Podiatry;  Laterality: Left;  . LOWER EXTREMITY ANGIOGRAM N/A 10/03/2014   Procedure: LOWER EXTREMITY ANGIOGRAM;  Surgeon: Laverda Page, MD;  Location: [facility] CATH LAB;  Service: Cardiovascular;  Laterality: N/A;  . PERIPHERAL VASCULAR CATHETERIZATION Left 12/03/2016   Procedure: Lower Extremity Angiography;  Surgeon: Algernon Huxley, MD;  Location: [place] CV LAB;  Service: Cardiovascular;  Laterality: Left;    Social History Social History  Substance Use Topics  . Smoking status: Former Smoker    Packs/day: 0.10    Years: 40.00    Types: Cigarettes  . Smokeless tobacco: Never Used     Comment: pt just quit 1 month ago   . Alcohol use 0.0 oz/week     Comment: 03/05/2015 "Might have a drink on holidays"    Family History Family History  Problem Relation Age of Onset  . CAD Mother   . CAD Father     Allergies  Allergen Reactions  . Penicillins Hives    Childhood allergy could have been more reaction only remembers hives.      REVIEW OF SYSTEMS (Negative unless checked)  Constitutional: [] Weight loss  [] Fever  [] Chills Cardiac: [] Chest pain   [] Chest pressure   [] Palpitations   [] Shortness of breath when laying flat   [] Shortness of breath at rest   [] Shortness of breath with exertion. Vascular:  [] Pain in legs with walking   [] Pain in legs at rest   [] Pain in legs when laying flat   [] Claudication   [] Pain in feet when walking  [] Pain in feet at rest  [] Pain in feet when laying flat   [] History of DVT   [] Phlebitis   [x] Swelling in legs   [] Varicose veins   [x] Non-healing ulcers Pulmonary:   [] Uses home oxygen   [] Productive cough   [] Hemoptysis   [] Wheeze  [] COPD   [] Asthma Neurologic:  [] Dizziness  [] Blackouts   [] Seizures   [x] History of stroke   [] History of TIA  [] Aphasia   [] Temporary blindness   [] Dysphagia    [] Weakness or numbness in arms   [] Weakness or numbness in legs Musculoskeletal:  [] Arthritis   [] Joint swelling   [] Joint pain   [] Low back pain Hematologic:  [] Easy bruising  [] Easy bleeding   [] Hypercoagulable state   [] Anemic   Gastrointestinal:  [] Blood in stool   [] Vomiting blood  [] Gastroesophageal reflux/heartburn   [] Abdominal pain Genitourinary:  [x] Chronic kidney disease   [] Difficult urination  [] Frequent urination  [] Burning with urination   [] Hematuria Skin:  [] Rashes   [x] Ulcers   [x] Wounds Psychological:  [] History of anxiety   []   History of major depression.  Physical Examination  BP (!) 147/86 (BP Location: Left Arm)   Pulse (!) 105   Resp 16   Ht 5' 7"  (1.702 m)   Wt 86.6 kg (191 lb)   BMI 29.91 kg/m  Gen:  WD/WN, NAD Head: [place]/AT, No temporalis wasting. Ear/Nose/Throat: Hearing grossly intact, nares w/o erythema or drainage, trachea midline Eyes: Conjunctiva clear. Sclera non-icteric Neck: Supple.  No JVD.  Pulmonary:  Good air movement, no use of accessory muscles.  Cardiac: RRR, normal S1, S2 Vascular:  Vessel Right Left  Radial Palpable Palpable  Ulnar Palpable Palpable  Brachial Palpable Palpable  Carotid Palpable, without bruit Palpable, without bruit  Aorta Not palpable N/A  Femoral Palpable Palpable  Popliteal Not Palpable Not Palpable  PT Not Palpable Not Palpable  DP Not Palpable 1+ Palpable   Gastrointestinal: soft, non-tender/non-distended. No guarding/reflex.  Musculoskeletal: M/S 5/5 throughout.  Right AKA. 2-3+ LLE edema. Neurologic: Sensation grossly intact in extremities.  Symmetrical.  Speech is fluent.  Psychiatric: Poor historian Dermatologic: left foot ulcers dressed today Lymph : No Cervical, Axillary, or Inguinal lymphadenopathy.      Labs Recent Results (from the past 2160 hour(s))  Blood culture (routine x 2)     Status: None   Collection Time: 12/02/16  3:39 PM  Result Value Ref Range   Specimen Description BLOOD LEFT ARM     Special Requests BOTTLES DRAWN AEROBIC AND ANAEROBIC 2MLAERO,2MLANA    Culture NO GROWTH 5 DAYS    Report Status 12/07/2016 FINAL   Blood culture (routine x 2)     Status: Abnormal   Collection Time: 12/02/16  3:39 PM  Result Value Ref Range   Specimen Description BLOOD LEFT HAND    Special Requests BOTTLES DRAWN AEROBIC AND ANAEROBIC 2MLAERO,1MLANA    Culture  Setup Time      GRAM POSITIVE COCCI AEROBIC BOTTLE ONLY CRITICAL RESULT CALLED TO, READ BACK BY AND VERIFIED WITH: MELISSA [place] 12/03/16 @ 50  [place]    Culture (A)     STAPHYLOCOCCUS SPECIES (COAGULASE NEGATIVE) THE SIGNIFICANCE OF ISOLATING THIS ORGANISM FROM A SINGLE SET OF BLOOD CULTURES WHEN MULTIPLE SETS ARE DRAWN IS UNCERTAIN. PLEASE NOTIFY THE MICROBIOLOGY DEPARTMENT WITHIN ONE WEEK IF SPECIATION AND SENSITIVITIES ARE REQUIRED. Performed at [facility]    Report Status 12/05/2016 FINAL   CBC with Differential     Status: Abnormal   Collection Time: 12/02/16  3:39 PM  Result Value Ref Range   WBC 9.3 3.6 - 11.0 K/uL   RBC 3.70 (L) 3.80 - 5.20 MIL/uL   Hemoglobin 11.5 (L) 12.0 - 16.0 g/dL   HCT 34.4 (L) 35.0 - 47.0 %   MCV 93.0 80.0 - 100.0 fL   MCH 31.2 26.0 - 34.0 pg   MCHC 33.5 32.0 - 36.0 g/dL   RDW 12.2 11.5 - 14.5 %   Platelets 283 150 - 440 K/uL   Neutrophils Relative % 58 %   Neutro Abs 5.4 1.4 - 6.5 K/uL   Lymphocytes Relative 32 %   Lymphs Abs 3.0 1.0 - 3.6 K/uL   Monocytes Relative 8 %   Monocytes Absolute 0.7 0.2 - 0.9 K/uL   Eosinophils Relative 1 %   Eosinophils Absolute 0.1 0 - 0.7 K/uL   Basophils Relative 1 %   Basophils Absolute 0.1 0 - 0.1 K/uL  Basic metabolic panel     Status: Abnormal   Collection Time: 12/02/16  3:39 PM  Result Value Ref Range  Sodium 129 (L) 135 - 145 mmol/L   Potassium 3.7 3.5 - 5.1 mmol/L   Chloride 88 (L) 101 - 111 mmol/L   CO2 31 22 - 32 mmol/L   Glucose, Bld 584 (HH) 65 - 99 mg/dL    Comment: CRITICAL RESULT CALLED TO, READ BACK BY AND VERIFIED  WITH GRACE CINDRIC 12/02/16 1606 KLW    BUN 23 (H) 6 - 20 mg/dL   Creatinine, Ser 1.13 (H) 0.44 - 1.00 mg/dL   Calcium 8.6 (L) 8.9 - 10.3 mg/dL   GFR calc non Af Amer 49 (L) >60 mL/min   GFR calc Af Amer 57 (L) >60 mL/min    Comment: (NOTE) The eGFR has been calculated using the CKD EPI equation. This calculation has not been validated in all clinical situations. eGFR's persistently <60 mL/min signify possible Chronic Kidney Disease.    Anion gap 10 5 - 15  Blood Culture ID Panel (Reflexed)     Status: Abnormal   Collection Time: 12/02/16  3:39 PM  Result Value Ref Range   Enterococcus species NOT DETECTED NOT DETECTED   Listeria monocytogenes NOT DETECTED NOT DETECTED   Staphylococcus species DETECTED (A) NOT DETECTED    Comment: CRITICAL RESULT CALLED TO, READ BACK BY AND VERIFIED WITH: MELISSA MACCIA 12/03/16 @ [place]    Staphylococcus aureus NOT DETECTED NOT DETECTED   Methicillin resistance NOT DETECTED NOT DETECTED   Streptococcus species NOT DETECTED NOT DETECTED   Streptococcus agalactiae NOT DETECTED NOT DETECTED   Streptococcus pneumoniae NOT DETECTED NOT DETECTED   Streptococcus pyogenes NOT DETECTED NOT DETECTED   Acinetobacter baumannii NOT DETECTED NOT DETECTED   Enterobacteriaceae species NOT DETECTED NOT DETECTED   Enterobacter cloacae complex NOT DETECTED NOT DETECTED   Escherichia coli NOT DETECTED NOT DETECTED   Klebsiella oxytoca NOT DETECTED NOT DETECTED   Klebsiella pneumoniae NOT DETECTED NOT DETECTED   Proteus species NOT DETECTED NOT DETECTED   Serratia marcescens NOT DETECTED NOT DETECTED   Haemophilus influenzae NOT DETECTED NOT DETECTED   Neisseria meningitidis NOT DETECTED NOT DETECTED   Pseudomonas aeruginosa NOT DETECTED NOT DETECTED   Candida albicans NOT DETECTED NOT DETECTED   Candida glabrata NOT DETECTED NOT DETECTED   Candida krusei NOT DETECTED NOT DETECTED   Candida parapsilosis NOT DETECTED NOT DETECTED   Candida tropicalis NOT  DETECTED NOT DETECTED  Glucose, capillary     Status: Abnormal   Collection Time: 12/02/16  5:34 PM  Result Value Ref Range   Glucose-Capillary 421 (H) 65 - 99 mg/dL  Glucose, capillary     Status: Abnormal   Collection Time: 12/02/16  8:48 PM  Result Value Ref Range   Glucose-Capillary 423 (H) 65 - 99 mg/dL  MRSA PCR Screening     Status: None   Collection Time: 12/03/16  1:00 AM  Result Value Ref Range   MRSA by PCR NEGATIVE NEGATIVE    Comment:        The GeneXpert MRSA Assay (FDA approved for NASAL specimens only), is one component of a comprehensive MRSA colonization surveillance program. It is not intended to diagnose MRSA infection nor to guide or monitor treatment for MRSA infections.   Basic metabolic panel     Status: Abnormal   Collection Time: 12/03/16  4:31 AM  Result Value Ref Range   Sodium 135 135 - 145 mmol/L   Potassium 3.1 (L) 3.5 - 5.1 mmol/L   Chloride 96 (L) 101 - 111 mmol/L   CO2 31 22 -  32 mmol/L   Glucose, Bld 352 (H) 65 - 99 mg/dL   BUN 20 6 - 20 mg/dL   Creatinine, Ser 0.91 0.44 - 1.00 mg/dL   Calcium 8.2 (L) 8.9 - 10.3 mg/dL   GFR calc non Af Amer >60 >60 mL/min   GFR calc Af Amer >60 >60 mL/min    Comment: (NOTE) The eGFR has been calculated using the CKD EPI equation. This calculation has not been validated in all clinical situations. eGFR's persistently <60 mL/min signify possible Chronic Kidney Disease.    Anion gap 8 5 - 15  CBC     Status: Abnormal   Collection Time: 12/03/16  4:31 AM  Result Value Ref Range   WBC 9.3 3.6 - 11.0 K/uL   RBC 3.47 (L) 3.80 - 5.20 MIL/uL   Hemoglobin 11.0 (L) 12.0 - 16.0 g/dL   HCT 31.7 (L) 35.0 - 47.0 %   MCV 91.6 80.0 - 100.0 fL   MCH 31.6 26.0 - 34.0 pg   MCHC 34.5 32.0 - 36.0 g/dL   RDW 12.0 11.5 - 14.5 %   Platelets 275 150 - 440 K/uL  Magnesium     Status: Abnormal   Collection Time: 12/03/16  4:31 AM  Result Value Ref Range   Magnesium 1.6 (L) 1.7 - 2.4 mg/dL  Glucose, capillary      Status: Abnormal   Collection Time: 12/03/16  7:16 AM  Result Value Ref Range   Glucose-Capillary 289 (H) 65 - 99 mg/dL  Glucose, capillary     Status: Abnormal   Collection Time: 12/03/16 11:13 AM  Result Value Ref Range   Glucose-Capillary 188 (H) 65 - 99 mg/dL  Hemoglobin A1c     Status: Abnormal   Collection Time: 12/03/16 11:21 AM  Result Value Ref Range   Hgb A1c MFr Bld 14.5 (H) 4.8 - 5.6 %    Comment: (NOTE)         Pre-diabetes: 5.7 - 6.4         Diabetes: >6.4         Glycemic control for adults with diabetes: <7.0    Mean Plasma Glucose 369 mg/dL    Comment: (NOTE) Performed At: [facility] [place], [place] [id number] Lindon Romp MD YW:[id number]   Sedimentation rate     Status: Abnormal   Collection Time: 12/03/16 11:21 AM  Result Value Ref Range   Sed Rate 75 (H) 0 - 30 mm/hr  Glucose, capillary     Status: Abnormal   Collection Time: 12/03/16  5:48 PM  Result Value Ref Range   Glucose-Capillary 151 (H) 65 - 99 mg/dL  Glucose, capillary     Status: Abnormal   Collection Time: 12/03/16  6:47 PM  Result Value Ref Range   Glucose-Capillary 193 (H) 65 - 99 mg/dL   Comment 1 Notify RN    Comment 2 Document in Chart   Glucose, capillary     Status: Abnormal   Collection Time: 12/03/16  9:25 PM  Result Value Ref Range   Glucose-Capillary 175 (H) 65 - 99 mg/dL   Comment 1 Notify RN    Comment 2 Document in Chart   Glucose, capillary     Status: Abnormal   Collection Time: 12/04/16  7:45 AM  Result Value Ref Range   Glucose-Capillary 191 (H) 65 - 99 mg/dL  Glucose, capillary     Status: Abnormal   Collection Time: 12/04/16 12:00 PM  Result Value Ref Range  Glucose-Capillary 220 (H) 65 - 99 mg/dL  Glucose, capillary     Status: Abnormal   Collection Time: 12/04/16  4:36 PM  Result Value Ref Range   Glucose-Capillary 264 (H) 65 - 99 mg/dL  Glucose, capillary     Status: Abnormal   Collection Time: 12/04/16  9:23 PM  Result Value  Ref Range   Glucose-Capillary 236 (H) 65 - 99 mg/dL  Basic metabolic panel     Status: Abnormal   Collection Time: 12/05/16  6:00 AM  Result Value Ref Range   Sodium 138 135 - 145 mmol/L   Potassium 3.4 (L) 3.5 - 5.1 mmol/L   Chloride 106 101 - 111 mmol/L   CO2 25 22 - 32 mmol/L   Glucose, Bld 188 (H) 65 - 99 mg/dL   BUN 14 6 - 20 mg/dL   Creatinine, Ser 0.66 0.44 - 1.00 mg/dL   Calcium 8.5 (L) 8.9 - 10.3 mg/dL   GFR calc non Af Amer >60 >60 mL/min   GFR calc Af Amer >60 >60 mL/min    Comment: (NOTE) The eGFR has been calculated using the CKD EPI equation. This calculation has not been validated in all clinical situations. eGFR's persistently <60 mL/min signify possible Chronic Kidney Disease.    Anion gap 7 5 - 15  CBC     Status: Abnormal   Collection Time: 12/05/16  6:53 AM  Result Value Ref Range   WBC 7.3 3.6 - 11.0 K/uL   RBC 3.72 (L) 3.80 - 5.20 MIL/uL   Hemoglobin 11.4 (L) 12.0 - 16.0 g/dL   HCT 33.7 (L) 35.0 - 47.0 %   MCV 90.5 80.0 - 100.0 fL   MCH 30.6 26.0 - 34.0 pg   MCHC 33.8 32.0 - 36.0 g/dL   RDW 12.3 11.5 - 14.5 %   Platelets 294 150 - 440 K/uL  Glucose, capillary     Status: Abnormal   Collection Time: 12/05/16  7:38 AM  Result Value Ref Range   Glucose-Capillary 184 (H) 65 - 99 mg/dL  Glucose, capillary     Status: None   Collection Time: 12/05/16 11:31 AM  Result Value Ref Range   Glucose-Capillary 92 65 - 99 mg/dL  Anaerobic culture     Status: None   Collection Time: 12/05/16  2:46 PM  Result Value Ref Range   Specimen Description WOUND    Special Requests LEFT HEEL Performed at [place].  CALL LAB IF FURTHER IID REQUIRED.   Report Status 12/09/2016 FINAL   Aerobic Culture (superficial specimen)     Status: None   Collection Time: 12/05/16  2:46 PM  Result Value Ref Range   Specimen Description HEEL LEFT    Special Requests NONE    Gram Stain      NO WBC SEEN ABUNDANT GRAM  POSITIVE COCCI IN PAIRS ABUNDANT GRAM NEGATIVE RODS Performed at [facility]    Culture      ABUNDANT STREPTOCOCCUS AGALACTIAE TESTING AGAINST S. AGALACTIAE NOT ROUTINELY PERFORMED DUE TO PREDICTABILITY OF AMP/PEN/VAN SUSCEPTIBILITY. FEW ESCHERICHIA COLI    Report Status 12/08/2016 FINAL    Organism ID, Bacteria ESCHERICHIA COLI       Susceptibility   Escherichia coli - MIC*    AMPICILLIN 8 SENSITIVE Sensitive     CEFAZOLIN <=4 SENSITIVE Sensitive     CEFEPIME <=1 SENSITIVE Sensitive     CEFTAZIDIME <=1 SENSITIVE Sensitive  CEFTRIAXONE <=1 SENSITIVE Sensitive     CIPROFLOXACIN <=0.25 SENSITIVE Sensitive     GENTAMICIN <=1 SENSITIVE Sensitive     IMIPENEM <=0.25 SENSITIVE Sensitive     TRIMETH/SULFA <=20 SENSITIVE Sensitive     AMPICILLIN/SULBACTAM <=2 SENSITIVE Sensitive     PIP/TAZO <=4 SENSITIVE Sensitive     Extended ESBL NEGATIVE Sensitive     * FEW ESCHERICHIA COLI  Glucose, capillary     Status: None   Collection Time: 12/05/16  3:45 PM  Result Value Ref Range   Glucose-Capillary 82 65 - 99 mg/dL  Glucose, capillary     Status: None   Collection Time: 12/05/16  4:55 PM  Result Value Ref Range   Glucose-Capillary 90 65 - 99 mg/dL  Vancomycin, trough     Status: Abnormal   Collection Time: 12/05/16  6:42 PM  Result Value Ref Range   Vancomycin Tr 14 (L) 15 - 20 ug/mL  Glucose, capillary     Status: Abnormal   Collection Time: 12/05/16  9:14 PM  Result Value Ref Range   Glucose-Capillary 166 (H) 65 - 99 mg/dL   Comment 1 Notify RN    Comment 2 Document in Chart   Basic metabolic panel     Status: Abnormal   Collection Time: 12/06/16  5:26 AM  Result Value Ref Range   Sodium 138 135 - 145 mmol/L   Potassium 3.9 3.5 - 5.1 mmol/L   Chloride 108 101 - 111 mmol/L   CO2 25 22 - 32 mmol/L   Glucose, Bld 212 (H) 65 - 99 mg/dL   BUN 13 6 - 20 mg/dL   Creatinine, Ser 0.87 0.44 - 1.00 mg/dL   Calcium 8.1 (L) 8.9 - 10.3 mg/dL   GFR calc non Af Amer >60 >60  mL/min   GFR calc Af Amer >60 >60 mL/min    Comment: (NOTE) The eGFR has been calculated using the CKD EPI equation. This calculation has not been validated in all clinical situations. eGFR's persistently <60 mL/min signify possible Chronic Kidney Disease.    Anion gap 5 5 - 15  CBC     Status: Abnormal   Collection Time: 12/06/16  5:26 AM  Result Value Ref Range   WBC 8.0 3.6 - 11.0 K/uL   RBC 3.47 (L) 3.80 - 5.20 MIL/uL   Hemoglobin 10.8 (L) 12.0 - 16.0 g/dL   HCT 31.7 (L) 35.0 - 47.0 %   MCV 91.4 80.0 - 100.0 fL   MCH 31.0 26.0 - 34.0 pg   MCHC 33.9 32.0 - 36.0 g/dL   RDW 12.4 11.5 - 14.5 %   Platelets 281 150 - 440 K/uL  Glucose, capillary     Status: Abnormal   Collection Time: 12/06/16  7:20 AM  Result Value Ref Range   Glucose-Capillary 201 (H) 65 - 99 mg/dL  Glucose, capillary     Status: Abnormal   Collection Time: 12/06/16 11:09 AM  Result Value Ref Range   Glucose-Capillary 241 (H) 65 - 99 mg/dL  Glucose, capillary     Status: Abnormal   Collection Time: 12/06/16  4:49 PM  Result Value Ref Range   Glucose-Capillary 246 (H) 65 - 99 mg/dL  Glucose, capillary     Status: Abnormal   Collection Time: 12/06/16  8:53 PM  Result Value Ref Range   Glucose-Capillary 306 (H) 65 - 99 mg/dL   Comment 1 Notify RN    Comment 2 Document in Chart   Glucose, capillary  Status: Abnormal   Collection Time: 12/07/16  7:38 AM  Result Value Ref Range   Glucose-Capillary 197 (H) 65 - 99 mg/dL  Glucose, capillary     Status: Abnormal   Collection Time: 12/07/16 11:45 AM  Result Value Ref Range   Glucose-Capillary 222 (H) 65 - 99 mg/dL  Glucose, capillary     Status: Abnormal   Collection Time: 12/07/16  4:29 PM  Result Value Ref Range   Glucose-Capillary 256 (H) 65 - 99 mg/dL  Vancomycin, trough     Status: None   Collection Time: 12/07/16  7:53 PM  Result Value Ref Range   Vancomycin Tr 18 15 - 20 ug/mL  Glucose, capillary     Status: Abnormal   Collection Time: 12/07/16   8:49 PM  Result Value Ref Range   Glucose-Capillary 268 (H) 65 - 99 mg/dL  Glucose, capillary     Status: Abnormal   Collection Time: 12/08/16  7:17 AM  Result Value Ref Range   Glucose-Capillary 203 (H) 65 - 99 mg/dL   Comment 1 Notify RN    Comment 2 Document in Chart   Glucose, capillary     Status: Abnormal   Collection Time: 12/08/16 11:17 AM  Result Value Ref Range   Glucose-Capillary 190 (H) 65 - 99 mg/dL   Comment 1 Notify RN    Comment 2 Document in Chart     Radiology No results found.    Assessment/Plan  Dyslipidemia lipid control important in reducing the progression of atherosclerotic disease. Continue statin therapy   S/P BKA (below knee amputation) Stable and healed  Diabetic foot ulcer Continue local wound care.  Perfusion improved  Atherosclerosis of native arteries of the extremities with ulceration ([place]) Her noninvasive studies today demonstrate patency of her left femoral to distal bypass and the tibial intervention below the bypass appears to be patent on the study today. Her overall perfusion to her foot is likely as good as it could be made at this point. I would not recommend any intervention or surgical therapy at this time. Podiatry can perform any procedures they see fit for her left foot ulceration. I will see her back in 6 months with noninvasive studies.    Leotis Pain, MD  01/21/2017 1:34 PM    This note was created with Dragon medical transcription system.  Any errors from dictation are purely unintentional

## 2017-01-21 NOTE — Assessment & Plan Note (Signed)
Stable and healed 

## 2017-01-21 NOTE — Assessment & Plan Note (Signed)
Continue local wound care.  Perfusion improved

## 2017-01-22 ENCOUNTER — Encounter (HOSPITAL_BASED_OUTPATIENT_CLINIC_OR_DEPARTMENT_OTHER): Payer: No Typology Code available for payment source | Attending: Internal Medicine

## 2017-01-22 DIAGNOSIS — Z923 Personal history of irradiation: Secondary | ICD-10-CM | POA: Insufficient documentation

## 2017-01-22 DIAGNOSIS — Z89511 Acquired absence of right leg below knee: Secondary | ICD-10-CM | POA: Diagnosis not present

## 2017-01-22 DIAGNOSIS — E1151 Type 2 diabetes mellitus with diabetic peripheral angiopathy without gangrene: Secondary | ICD-10-CM | POA: Diagnosis not present

## 2017-01-22 DIAGNOSIS — Z8673 Personal history of transient ischemic attack (TIA), and cerebral infarction without residual deficits: Secondary | ICD-10-CM | POA: Insufficient documentation

## 2017-01-22 DIAGNOSIS — I10 Essential (primary) hypertension: Secondary | ICD-10-CM | POA: Diagnosis not present

## 2017-01-22 DIAGNOSIS — L89623 Pressure ulcer of left heel, stage 3: Secondary | ICD-10-CM | POA: Diagnosis present

## 2017-01-30 DIAGNOSIS — I10 Essential (primary) hypertension: Secondary | ICD-10-CM | POA: Diagnosis not present

## 2017-02-13 ENCOUNTER — Encounter: Payer: Self-pay | Admitting: Vascular Surgery

## 2017-02-13 DIAGNOSIS — I10 Essential (primary) hypertension: Secondary | ICD-10-CM | POA: Diagnosis not present

## 2017-02-19 ENCOUNTER — Ambulatory Visit (HOSPITAL_BASED_OUTPATIENT_CLINIC_OR_DEPARTMENT_OTHER): Payer: Medicare Other

## 2017-02-19 ENCOUNTER — Encounter: Payer: Self-pay | Admitting: Vascular Surgery

## 2017-02-19 ENCOUNTER — Encounter (HOSPITAL_BASED_OUTPATIENT_CLINIC_OR_DEPARTMENT_OTHER): Payer: Medicare Other | Attending: Internal Medicine

## 2017-02-19 ENCOUNTER — Ambulatory Visit (INDEPENDENT_AMBULATORY_CARE_PROVIDER_SITE_OTHER): Payer: Medicare Other | Admitting: Vascular Surgery

## 2017-02-19 VITALS — BP 118/66 | HR 90 | Temp 99.4°F | Resp 20 | Ht 67.0 in | Wt 191.0 lb

## 2017-02-19 DIAGNOSIS — I739 Peripheral vascular disease, unspecified: Secondary | ICD-10-CM

## 2017-02-19 DIAGNOSIS — I7025 Atherosclerosis of native arteries of other extremities with ulceration: Secondary | ICD-10-CM

## 2017-02-19 NOTE — Progress Notes (Signed)
      HPI:  This is a 71 y.o. female who is s/p Left femoral to posterior tibial bypass with reverse great saph vein 03/06/2015.  She returns today for follow-up. She has had a previous right BKA. She is nonambulatory. 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 She has never been fitted for prosthetic on the right leg. She is fairly unstable and has had several falls. She does not complain of rest pain or ulcers in the left foot. She has chronic edema in the left leg.  She now has developed a large left heel decubitus ulcer. She is currently being followed at the wound center.   Physical Exam:      Vitals:   02/19/17 1334  BP: 118/66  Pulse: 90  Resp: 20  Temp: 99.4 F (37.4 C)  TempSrc: Oral  SpO2: 93%  Weight: 191 lb (86.6 kg)  Height: 5\' 7"  (1.702 m)   Extremities:  Left LE edema moderate seems to be dependent at this point, Chronic and unchanged 7 x 5 left heel decubitus ulcer 2 mm depth with beefy appearing granulation tissue throughout except one central 2 cm dark area   Data: Duplex ultrasound December 2017 showed a patent left femoral to posterior tibial artery bypass graft with monophasic waveforms ABI of 1.01. 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   Assessment/Plan:   Patient with large left heel decubitus with patent bypass graft. At this point, I believe the only options would be conservative measures with offloading the heel dry dressings hopefully this will heal with time.Due to her current state of debility, she is not a candidate for further revascularization. The wound has made significant improvement and I continued to encourage her to improve her nutrition keep the heel off the bed and continue her follow-up with the wound care center.   She will follow-up with us in 6 weeks' time or sooner if worsening pain or infection   Fabienne Brunsharles Bettie Swavely,  MD [facility] Office: [phone] Pager: [phone]

## 2017-02-28 ENCOUNTER — Emergency Department (HOSPITAL_COMMUNITY): Payer: Medicare Other

## 2017-02-28 ENCOUNTER — Encounter (HOSPITAL_COMMUNITY): Payer: Self-pay | Admitting: Physician Assistant

## 2017-02-28 ENCOUNTER — Emergency Department (HOSPITAL_COMMUNITY)
Admission: EM | Admit: 2017-02-28 | Discharge: 2017-02-28 | Disposition: A | Discharge status: Home / Self Care | Payer: Medicare Other | Attending: Emergency Medicine | Admitting: Emergency Medicine

## 2017-02-28 DIAGNOSIS — Y9289 Other specified places as the place of occurrence of the external cause: Secondary | ICD-10-CM | POA: Diagnosis not present

## 2017-02-28 DIAGNOSIS — W19XXXA Unspecified fall, initial encounter: Secondary | ICD-10-CM

## 2017-02-28 DIAGNOSIS — S0990XA Unspecified injury of head, initial encounter: Secondary | ICD-10-CM | POA: Diagnosis present

## 2017-02-28 DIAGNOSIS — E1151 Type 2 diabetes mellitus with diabetic peripheral angiopathy without gangrene: Secondary | ICD-10-CM | POA: Diagnosis not present

## 2017-02-28 DIAGNOSIS — S0101XA Laceration without foreign body of scalp, initial encounter: Secondary | ICD-10-CM | POA: Diagnosis not present

## 2017-02-28 DIAGNOSIS — I5032 Chronic diastolic (congestive) heart failure: Secondary | ICD-10-CM | POA: Diagnosis not present

## 2017-02-28 DIAGNOSIS — E119 Type 2 diabetes mellitus without complications: Secondary | ICD-10-CM | POA: Insufficient documentation

## 2017-02-28 DIAGNOSIS — Y9389 Activity, other specified: Secondary | ICD-10-CM | POA: Diagnosis not present

## 2017-02-28 DIAGNOSIS — I110 Hypertensive heart disease with heart failure: Secondary | ICD-10-CM | POA: Diagnosis not present

## 2017-02-28 DIAGNOSIS — Z79899 Other long term (current) drug therapy: Secondary | ICD-10-CM | POA: Diagnosis not present

## 2017-02-28 DIAGNOSIS — Y999 Unspecified external cause status: Secondary | ICD-10-CM | POA: Diagnosis not present

## 2017-02-28 DIAGNOSIS — Z794 Long term (current) use of insulin: Secondary | ICD-10-CM | POA: Insufficient documentation

## 2017-02-28 DIAGNOSIS — Z8673 Personal history of transient ischemic attack (TIA), and cerebral infarction without residual deficits: Secondary | ICD-10-CM | POA: Diagnosis not present

## 2017-02-28 DIAGNOSIS — W06XXXA Fall from bed, initial encounter: Secondary | ICD-10-CM | POA: Diagnosis not present

## 2017-02-28 LAB — CBG MONITORING, ED: Glucose-Capillary: 360 mg/dL — ABNORMAL HIGH (ref 65–99)

## 2017-02-28 MED ORDER — HYDROCODONE-ACETAMINOPHEN 5-325 MG PO TABS
1.0000 | ORAL_TABLET | Freq: Once | ORAL | Status: AC
  Administered 2017-02-28: 1 via ORAL
  Filled 2017-02-28: qty 1

## 2017-02-28 MED ORDER — TETANUS-DIPHTH-ACELL PERTUSSIS 5-2.5-18.5 LF-MCG/0.5 IM SUSP
0.5000 mL | Freq: Once | INTRAMUSCULAR | Status: AC
  Administered 2017-02-28: 0.5 mL via INTRAMUSCULAR
  Filled 2017-02-28: qty 0.5

## 2017-02-28 MED ORDER — LIDOCAINE-EPINEPHRINE 2 %-1:200000 IJ SOLN
10.0000 mL | Freq: Once | INTRAMUSCULAR | Status: AC
Start: 2017-02-28 — End: 2017-02-28
  Administered 2017-02-28: 10 mL
  Filled 2017-02-28: qty 20

## 2017-02-28 NOTE — ED Notes (Signed)
[facility] contacted to transport patient back to [facility]

## 2017-02-28 NOTE — ED Triage Notes (Addendum)
[facility] in bed reaching for something and fell out of bed.  Pain 10/10 to L parietal region along with lac.  NO loc.  Denies neck/back pain. ao x 4.  VS 144/92, hr 94, rr18 cbg 475. ao x 4.

## 2017-02-28 NOTE — ED Notes (Signed)
CBG 360; RN notified

## 2017-02-28 NOTE — ED Provider Notes (Signed)
Fell from bed - fell to floor - hit head on the floor - no LOC , remembers everything.  Has had stroke in the past and now in NH for that reason - on Plavix - no other c/o other than chronic pain in the feet. - has hx of amputations / ulcers - has 10/10 pain in the head.  She has wound to the L parietal scalp - bleeding when looking at the wound - no neuro defecits from baseline.    Needs CT head - r/o ICH - repair wound, update TDAP as needed.    I explored the wound and found there to be significantly bleeding from a small artery - pressure was held and wound explored - was a 4.5 cm laceration  I placed 5 staples in the wound with good hemostasis after lidocaine with epi was instilled, pt tolerated Procedures,  No CT evidence of ICH / fractures  LACERATION REPAIR Performed by: Vida RollerBrian D Sheriece Jefcoat Authorized by: Vida RollerBrian D Jestine Bicknell Consent: Verbal consent obtained. Risks and benefits: risks, benefits and alternatives were discussed Consent given by: patient Patient identity confirmed: provided demographic data Prepped and Draped in normal sterile fashion Wound explored  Laceration Location: Scalp  Laceration Length: 4.5 cm  No Foreign Bodies seen or palpated  Anesthesia: local infiltration  Local anesthetic: lidocaine 1% with epinephrine  Anesthetic total: 8 ml  Irrigation method: syringe Amount of cleaning: standard  Skin closure: staples  Number of sutures: 5  Technique: staples  Patient tolerance: Patient tolerated the procedure well with no immediate complications.   Medical screening examination/treatment/procedure(s) were conducted as a shared visit with non-physician practitioner(s) and myself.  I personally evaluated the patient during the encounter.  Clinical Impression:   Final diagnoses:  Laceration of scalp, initial encounter  Fall, initial encounter         Eber HongBrian Carinne Brandenburger, MD 03/01/17 1049

## 2017-02-28 NOTE — ED Provider Notes (Signed)
[facility]-EMERGENCY DEPT Provider Note   CSN: [id number] Arrival date & time: 02/28/17  1622     History   Chief Complaint Chief Complaint  Patient presents with  . Fall    HPI Laurie [name] is a 71 y.o. female.  She reports that today she was sitting on the side of her bed and while reaching for an object, fell out of her bed striking the left side of her head against the door frame and the floor.  She reports no loss of consciousness that she remembers the entire incident. She denies any shortness of breath chest pain or dizziness prior to falling simply stating that she could not get what she was reaching for.  She is complaining of 10 out of 10 pain on the left side of her head.  Reports no nausea, vomiting, dizziness, blurred vision, neck or back pain.      Past Medical History:  Diagnosis Date  . Anemia   . Anxiety   . Bacteremia   . Bursitis    "left hip; sometimes left shoulder" (03/05/2015)  . Chest pain    Troponins up to 0.12 while hospitalized for E.coli PNA  . Depression   . Family history of adverse reaction to anesthesia    "brother died 07/07/13; woke up w/headache, vomited blood; drilled holes in his head; bleeding stroke; never came back" (03/05/2015)  . Headache    "when someone gets on my nerves" (03/05/2015)  . High cholesterol   . Hypertension   . Narcolepsy   . 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 Stroke [facility]) 10/2014   "weaker on left side since" (03/05/2015)  . Type II diabetes mellitus (HCC)   . Walking pneumonia 04/2009   "double" /notes 04/23/2011    Patient Active Problem List   Diagnosis Date Noted  . Atherosclerosis of native arteries of the extremities with ulceration (HCC) 01/21/2017  . Pressure injury of skin 12/03/2016  . PVD (peripheral vascular disease) (HCC) 03/05/2015  . S/P BKA (below knee amputation) (HCC) 11/23/2014  . Depression 11/11/2014  . Gangrene of foot (HCC)   . Hypokalemia   . Peripheral vascular  disease (HCC) 11/02/2014  . Gangrene (HCC) 11/02/2014  . Wound, surgical, infected 11/01/2014  . Diabetic foot ulcer (HCC) 11/01/2014  . Trochanteric bursitis of left hip 05/29/2014  . Spastic hemiplegia affecting nondominant side (HCC) 12/02/2013  . Chronic diastolic heart failure, grade 2 10/26/2013  . Dyslipidemia 10/26/2013  . CVA (cerebral infarction) 10/25/2013  . Arthralgia of hip 10/25/2013  . Anemia 05/17/2009  . Diabetes mellitus type 2 with peripheral artery disease (HCC) 05/08/2009  . Anxiety state 05/08/2009  . Essential hypertension 05/08/2009    Past Surgical History:  Procedure Laterality Date  . ABDOMINAL AORTAGRAM N/A 03/05/2015   Procedure: ABDOMINAL Ronny Flurry;  Surgeon: Chuck Hint, MD;  Location: [facility] CATH LAB;  Service: Cardiovascular;  Laterality: N/A;  . AMPUTATION Right 11/03/2014   Procedure: AMPUTATION BELOW KNEE;  Surgeon: Sherren Kerns, MD;  Location: [facility] OR;  Service: Vascular;  Laterality: Right;  . FEMORAL-TIBIAL BYPASS GRAFT Left 03/06/2015   Procedure: BYPASS GRAFT Left FEMORAL-POSTERIOR TIBIAL ARTERY with reversed saphenous vein;  Surgeon: Sherren Kerns, MD;  Location: [facility] OR;  Service: Vascular;  Laterality: Left;  . INTRAOPERATIVE ARTERIOGRAM Left 03/06/2015   Procedure: INTRA OPERATIVE ARTERIOGRAM;  Surgeon: Sherren Kerns, MD;  Location: [facility] OR;  Service: Vascular;  Laterality: Left;  . IRRIGATION AND DEBRIDEMENT  FOOT Left 12/05/2016   Procedure: IRRIGATION AND DEBRIDEMENT FOOT;  Surgeon: Gwyneth RevelsJustin Fowler, DPM;  Location: [facility] ORS;  Service: Podiatry;  Laterality: Left;  . LOWER EXTREMITY ANGIOGRAM N/A 10/03/2014   Procedure: LOWER EXTREMITY ANGIOGRAM;  Surgeon: Pamella PertJagadeesh R Ganji, MD;  [facility];  Service: Cardiovascular;  Laterality: N/A;  . PERIPHERAL VASCULAR CATHETERIZATION Left 12/03/2016   Procedure: Lower Extremity Angiography;  Surgeon: Annice NeedyJason S Dew, MD;  Location: [facility] INVASIVE CV LAB;  Service: Cardiovascular;   Laterality: Left;    OB History    No data available       Home Medications    Prior to Admission medications   Medication Sig Start Date End Date Taking? Authorizing Provider  ALPRAZolam Prudy Feeler(XANAX) 0.25 MG tablet Take one tablet by mouth twice daily as needed for anxiety 12/08/16   Auburn BilberryShreyang Patel, MD  amphetamine-dextroamphetamine (ADDERALL) 10 MG tablet Take 10 mg by mouth 2 (two) times daily with a meal. Reported on 07/02/2016    Historical Provider, MD  ascorbic acid (VITAMIN C) 500 MG tablet Take 500 mg by mouth daily.    Historical Provider, MD  atorvastatin (LIPITOR) 10 MG tablet Take 1 tablet (10 mg total) by mouth daily at 6 PM. 11/04/13   Evlyn KannerPamela S Love, PA-C  cloNIDine (CATAPRES) 0.2 MG tablet Take 0.2 mg by mouth 3 (three) times daily.    Historical Provider, MD  clopidogrel (PLAVIX) 75 MG tablet Take 75 mg by mouth daily.    Historical Provider, MD  diclofenac sodium (VOLTAREN) 1 % GEL Apply 4 g topically 4 (four) times daily. 06/20/16   April Palumbo, MD  DULoxetine (CYMBALTA) 60 MG capsule Take 60 mg by mouth daily.    Historical Provider, MD  enoxaparin (LOVENOX) 40 MG/0.4ML injection Inject 0.4 mLs (40 mg total) into the skin daily. 12/08/16 12/23/16  Auburn BilberryShreyang Patel, MD  gabapentin (NEURONTIN) 300 MG capsule Take 1 capsule (300 mg total) by mouth 2 (two) times daily. 03/13/14   Erick ColaceAndrew E Kirsteins, MD  HYDROcodone-acetaminophen (NORCO/VICODIN) 5-325 MG tablet Take 1-2 tablets by mouth every 4 (four) hours as needed for moderate pain. 12/08/16   Auburn BilberryShreyang Patel, MD  insulin aspart (NOVOLOG) 100 UNIT/ML injection Inject into the skin 3 (three) times daily before meals.    Historical Provider, MD  insulin glargine (LANTUS) 100 UNIT/ML injection Inject 0.2 mLs (20 Units total) into the skin at bedtime. 11/07/14   Jeralyn BennettEzequiel Zamora, MD  lamoTRIgine (LAMICTAL) 25 MG tablet Take 25 mg by mouth daily.    Historical Provider, MD  lisinopril (PRINIVIL,ZESTRIL) 20 MG tablet Take 1 tablet (20 mg  total) by mouth daily. 11/07/14   Jeralyn BennettEzequiel Zamora, MD  Multiple Vitamins-Minerals (CERTAGEN PO) Take by mouth.    Historical Provider, MD  oxybutynin (DITROPAN-XL) 5 MG 24 hr tablet Take 5 mg by mouth at bedtime.    Historical Provider, MD  Polyethyl Glycol-Propyl Glycol (SYSTANE) 0.4-0.3 % SOLN Apply to eye.    Historical Provider, MD  senna (SENOKOT) 8.6 MG TABS tablet Take 1 tablet by mouth.    Historical Provider, MD  senna-docusate (SENOKOT-S) 8.6-50 MG tablet Take 1 tablet by mouth at bedtime as needed for mild constipation. 12/08/16   Auburn BilberryShreyang Patel, MD    Family History Family History  Problem Relation Age of Onset  . CAD Mother   . CAD Father     Social History Social History  Substance Use Topics  . Smoking status: Former Smoker    Packs/day: 0.10    Years:  40.00    Types: Cigarettes    Quit date: 11/21/2016  . Smokeless tobacco: Never Used  . Alcohol use 0.0 oz/week     Comment: 03/05/2015 "Might have a drink on holidays"     Allergies   Penicillins   Review of Systems Review of Systems  Constitutional: Negative for activity change, chills, fatigue and fever.  HENT: Negative for ear pain and sore throat.   Eyes: Negative for pain and visual disturbance.  Respiratory: Negative for cough and shortness of breath.   Cardiovascular: Negative for chest pain and palpitations.  Gastrointestinal: Negative for abdominal pain and vomiting.  Genitourinary: Negative for dysuria and hematuria.  Musculoskeletal: Negative for arthralgias, back pain, neck pain and neck stiffness.  Skin: Negative for color change and rash.  Neurological: Positive for headaches. Negative for syncope.  All other systems reviewed and are negative.    Physical Exam Updated Vital Signs Vitals:   02/28/17 1900 02/28/17 1915 02/28/17 1930 02/28/17 1945  BP: 182/82 167/77 164/75 165/76  Pulse: 99 102 105 105  Resp:    16  Temp:      TempSrc:      SpO2: 96% 98% 100% 100%     Physical Exam    Constitutional: She is oriented to person, place, and time. She appears well-developed and well-nourished. No distress.  HENT:  Head: Normocephalic. Head is with contusion and with laceration.    Right Ear: Hearing normal.  Left Ear: Hearing normal.  4.5 cm laceration on left parietal scalp.  Upon removal of dressing there was pulsating blood from the wound.  Bleeding was controled with sutures and pressure. She had an approximately 7cm hematoma around the laceration.    Unable to visualize TM bilaterally 2/2 cerumen.   Eyes: EOM are normal. Pupils are equal, round, and reactive to light.  Neck: Normal range of motion. Neck supple.  Cardiovascular: Normal rate, regular rhythm and normal heart sounds.   Pulmonary/Chest: Effort normal and breath sounds normal. No respiratory distress.  Abdominal: Soft. She exhibits no distension. There is no tenderness.  Feet:  Right Foot: amputated Neurological: She is alert and oriented to person, place, and time. No cranial nerve deficit.  Skin: Skin is warm and dry. She is not diaphoretic.  Psychiatric: She has a normal mood and affect.     ED Treatments / Results  Labs (all labs ordered are listed, but only abnormal results are displayed) Labs Reviewed  CBG MONITORING, ED - Abnormal; Notable for the following:       Result Value   Glucose-Capillary 360 (*)    All other components within normal limits    EKG  EKG Interpretation None       Radiology Ct Head Wo Contrast  Result Date: 02/28/2017 CLINICAL DATA:  Hit head against door frame. EXAM: CT HEAD WITHOUT CONTRAST TECHNIQUE: Contiguous axial images were obtained from the base of the skull through the vertex without intravenous contrast. COMPARISON:  10/24/2013 FINDINGS: Brain: There is no evidence for acute hemorrhage, hydrocephalus, mass lesion, or abnormal extra-axial fluid collection. No definite CT evidence for acute infarction. Diffuse loss of parenchymal volume is consistent  with atrophy. Patchy low attenuation in the deep hemispheric and periventricular white matter is nonspecific, but likely reflects chronic microvascular ischemic demyelination. Vascular: Atherosclerotic calcification is visualized in the carotid arteries. No dense MCA sign. Major dural sinuses are unremarkable. Skull: No evidence for fracture. No worrisome lytic or sclerotic lesion. Sinuses/Orbits: Mild mucosal disease right maxillary sinus. Remaining visualized  paranasal sinuses are clear. Visualized portions of the globes and intraorbital fat are unremarkable. Other: None. IMPRESSION: 1. No acute intracranial abnormality. 2. Atrophy with chronic small vessel white matter ischemic disease. Electronically Signed   By: Kennith Center M.D.   On: 02/28/2017 18:51    Procedures Procedures   Medications Ordered in ED Medications  lidocaine-EPINEPHrine (XYLOCAINE W/EPI) 2 %-1:200000 (PF) injection 10 mL (10 mLs Infiltration Given 02/28/17 1753)  HYDROcodone-acetaminophen (NORCO/VICODIN) 5-325 MG per tablet 1 tablet (1 tablet Oral Given 02/28/17 2006)  Tdap (BOOSTRIX) injection 0.5 mL (0.5 mLs Intramuscular Given 02/28/17 2007)     Initial Impression / Assessment and Plan / ED Course  I have reviewed the triage vital signs and the nursing notes.  Pertinent labs & imaging results that were available during my care of the patient were reviewed by me and considered in my medical decision making (see chart for details).   Wound explored with Dr. Hyacinth Meeker and base of wound visualized without evidence of foreign body.  Laceration occurred < 8 hours prior to repair which was well tolerated. Tdap updated.  Pt has comorbidities to effect normal wound healing including diabetes, PVD. Pt discharged without antibiotics.  Discussed suture/staple home care with patient and answered questions. Pt to follow-up for wound check and suture removal in 7 days; they are to return to the ED sooner for signs of infection. Pt is  hemodynamically stable with no complaints prior to dc. She was given strict return precautions.  She was given her home dose of hydrocodone-APAP 5-325 in the ED prior to discharge.     Final Clinical Impressions(s) / ED Diagnoses   Final diagnoses:  Laceration of scalp, initial encounter  Fall, initial encounter    New Prescriptions New Prescriptions   No medications on file     Cristina Gong, [place] 02/28/17 2118    Eber Hong, MD 03/01/17 1051

## 2017-02-28 NOTE — ED Notes (Signed)
MD at bedside suturing wound.

## 2017-03-25 ENCOUNTER — Encounter: Payer: Self-pay | Admitting: Vascular Surgery

## 2017-04-02 ENCOUNTER — Ambulatory Visit: Payer: Medicare Other | Admitting: Vascular Surgery

## 2017-07-02 ENCOUNTER — Ambulatory Visit: Payer: Medicare Other | Admitting: Vascular Surgery

## 2017-07-02 ENCOUNTER — Other Ambulatory Visit (HOSPITAL_COMMUNITY): Payer: Medicare Other

## 2017-07-02 ENCOUNTER — Encounter (HOSPITAL_COMMUNITY): Payer: Medicare Other

## 2017-07-22 ENCOUNTER — Ambulatory Visit (INDEPENDENT_AMBULATORY_CARE_PROVIDER_SITE_OTHER): Payer: Medicare Other | Admitting: Vascular Surgery

## 2017-07-22 ENCOUNTER — Encounter (INDEPENDENT_AMBULATORY_CARE_PROVIDER_SITE_OTHER): Payer: Medicare Other

## 2017-07-23 ENCOUNTER — Inpatient Hospital Stay (HOSPITAL_COMMUNITY)
Admission: EM | Admit: 2017-07-23 | Discharge: 2017-08-02 | DRG: 871 | Disposition: A | Discharge status: Skilled Nursing Facility | Payer: Medicare Other | Attending: Internal Medicine | Admitting: Internal Medicine

## 2017-07-23 ENCOUNTER — Inpatient Hospital Stay (HOSPITAL_COMMUNITY): Payer: Medicare Other

## 2017-07-23 ENCOUNTER — Emergency Department (HOSPITAL_COMMUNITY): Payer: Medicare Other

## 2017-07-23 ENCOUNTER — Encounter (HOSPITAL_COMMUNITY): Payer: Self-pay | Admitting: Emergency Medicine

## 2017-07-23 DIAGNOSIS — E785 Hyperlipidemia, unspecified: Secondary | ICD-10-CM | POA: Diagnosis present

## 2017-07-23 DIAGNOSIS — J180 Bronchopneumonia, unspecified organism: Secondary | ICD-10-CM | POA: Diagnosis present

## 2017-07-23 DIAGNOSIS — Z7902 Long term (current) use of antithrombotics/antiplatelets: Secondary | ICD-10-CM

## 2017-07-23 DIAGNOSIS — L03119 Cellulitis of unspecified part of limb: Secondary | ICD-10-CM | POA: Diagnosis not present

## 2017-07-23 DIAGNOSIS — R0602 Shortness of breath: Secondary | ICD-10-CM

## 2017-07-23 DIAGNOSIS — L03116 Cellulitis of left lower limb: Secondary | ICD-10-CM | POA: Diagnosis present

## 2017-07-23 DIAGNOSIS — E1151 Type 2 diabetes mellitus with diabetic peripheral angiopathy without gangrene: Secondary | ICD-10-CM | POA: Diagnosis present

## 2017-07-23 DIAGNOSIS — Z79899 Other long term (current) drug therapy: Secondary | ICD-10-CM | POA: Diagnosis not present

## 2017-07-23 DIAGNOSIS — J181 Lobar pneumonia, unspecified organism: Secondary | ICD-10-CM

## 2017-07-23 DIAGNOSIS — E11621 Type 2 diabetes mellitus with foot ulcer: Secondary | ICD-10-CM | POA: Diagnosis present

## 2017-07-23 DIAGNOSIS — E1142 Type 2 diabetes mellitus with diabetic polyneuropathy: Secondary | ICD-10-CM | POA: Diagnosis present

## 2017-07-23 DIAGNOSIS — E7800 Pure hypercholesterolemia, unspecified: Secondary | ICD-10-CM | POA: Diagnosis present

## 2017-07-23 DIAGNOSIS — G8929 Other chronic pain: Secondary | ICD-10-CM | POA: Diagnosis present

## 2017-07-23 DIAGNOSIS — F411 Generalized anxiety disorder: Secondary | ICD-10-CM | POA: Diagnosis present

## 2017-07-23 DIAGNOSIS — E11628 Type 2 diabetes mellitus with other skin complications: Secondary | ICD-10-CM | POA: Diagnosis not present

## 2017-07-23 DIAGNOSIS — M25552 Pain in left hip: Secondary | ICD-10-CM | POA: Diagnosis present

## 2017-07-23 DIAGNOSIS — I110 Hypertensive heart disease with heart failure: Secondary | ICD-10-CM | POA: Diagnosis present

## 2017-07-23 DIAGNOSIS — I739 Peripheral vascular disease, unspecified: Secondary | ICD-10-CM | POA: Diagnosis not present

## 2017-07-23 DIAGNOSIS — E08621 Diabetes mellitus due to underlying condition with foot ulcer: Secondary | ICD-10-CM | POA: Diagnosis not present

## 2017-07-23 DIAGNOSIS — K219 Gastro-esophageal reflux disease without esophagitis: Secondary | ICD-10-CM | POA: Diagnosis present

## 2017-07-23 DIAGNOSIS — L97529 Non-pressure chronic ulcer of other part of left foot with unspecified severity: Secondary | ICD-10-CM | POA: Diagnosis present

## 2017-07-23 DIAGNOSIS — Z87891 Personal history of nicotine dependence: Secondary | ICD-10-CM | POA: Diagnosis not present

## 2017-07-23 DIAGNOSIS — E872 Acidosis: Secondary | ICD-10-CM | POA: Diagnosis present

## 2017-07-23 DIAGNOSIS — E8779 Other fluid overload: Secondary | ICD-10-CM | POA: Diagnosis not present

## 2017-07-23 DIAGNOSIS — L97401 Non-pressure chronic ulcer of unspecified heel and midfoot limited to breakdown of skin: Secondary | ICD-10-CM | POA: Diagnosis not present

## 2017-07-23 DIAGNOSIS — L89623 Pressure ulcer of left heel, stage 3: Secondary | ICD-10-CM | POA: Diagnosis present

## 2017-07-23 DIAGNOSIS — A419 Sepsis, unspecified organism: Secondary | ICD-10-CM | POA: Diagnosis present

## 2017-07-23 DIAGNOSIS — L97425 Non-pressure chronic ulcer of left heel and midfoot with muscle involvement without evidence of necrosis: Secondary | ICD-10-CM | POA: Diagnosis not present

## 2017-07-23 DIAGNOSIS — I10 Essential (primary) hypertension: Secondary | ICD-10-CM | POA: Diagnosis present

## 2017-07-23 DIAGNOSIS — Z794 Long term (current) use of insulin: Secondary | ICD-10-CM

## 2017-07-23 DIAGNOSIS — L0390 Cellulitis, unspecified: Secondary | ICD-10-CM | POA: Diagnosis not present

## 2017-07-23 DIAGNOSIS — R509 Fever, unspecified: Secondary | ICD-10-CM | POA: Diagnosis present

## 2017-07-23 DIAGNOSIS — Z89511 Acquired absence of right leg below knee: Secondary | ICD-10-CM | POA: Diagnosis not present

## 2017-07-23 DIAGNOSIS — E669 Obesity, unspecified: Secondary | ICD-10-CM | POA: Diagnosis present

## 2017-07-23 DIAGNOSIS — Z8673 Personal history of transient ischemic attack (TIA), and cerebral infarction without residual deficits: Secondary | ICD-10-CM | POA: Diagnosis not present

## 2017-07-23 DIAGNOSIS — M25559 Pain in unspecified hip: Secondary | ICD-10-CM | POA: Diagnosis not present

## 2017-07-23 DIAGNOSIS — I5032 Chronic diastolic (congestive) heart failure: Secondary | ICD-10-CM | POA: Diagnosis present

## 2017-07-23 DIAGNOSIS — D649 Anemia, unspecified: Secondary | ICD-10-CM | POA: Diagnosis not present

## 2017-07-23 DIAGNOSIS — Z8249 Family history of ischemic heart disease and other diseases of the circulatory system: Secondary | ICD-10-CM

## 2017-07-23 DIAGNOSIS — J189 Pneumonia, unspecified organism: Secondary | ICD-10-CM

## 2017-07-23 DIAGNOSIS — M7989 Other specified soft tissue disorders: Secondary | ICD-10-CM | POA: Diagnosis not present

## 2017-07-23 DIAGNOSIS — K769 Liver disease, unspecified: Secondary | ICD-10-CM | POA: Diagnosis not present

## 2017-07-23 DIAGNOSIS — L97509 Non-pressure chronic ulcer of other part of unspecified foot with unspecified severity: Secondary | ICD-10-CM

## 2017-07-23 LAB — CBC WITH DIFFERENTIAL/PLATELET
BASOS ABS: 0 10*3/uL (ref 0.0–0.1)
BASOS PCT: 0 %
EOS PCT: 0 %
Eosinophils Absolute: 0 10*3/uL (ref 0.0–0.7)
HEMATOCRIT: 33.9 % — AB (ref 36.0–46.0)
Hemoglobin: 11.1 g/dL — ABNORMAL LOW (ref 12.0–15.0)
LYMPHS PCT: 4 %
Lymphs Abs: 0.4 10*3/uL — ABNORMAL LOW (ref 0.7–4.0)
MCH: 30.1 pg (ref 26.0–34.0)
MCHC: 32.7 g/dL (ref 30.0–36.0)
MCV: 91.9 fL (ref 78.0–100.0)
Monocytes Absolute: 0.5 10*3/uL (ref 0.1–1.0)
Monocytes Relative: 5 %
Neutro Abs: 9.4 10*3/uL — ABNORMAL HIGH (ref 1.7–7.7)
Neutrophils Relative %: 91 %
PLATELETS: 272 10*3/uL (ref 150–400)
RBC: 3.69 MIL/uL — AB (ref 3.87–5.11)
RDW: 13.9 % (ref 11.5–15.5)
WBC: 10.3 10*3/uL (ref 4.0–10.5)

## 2017-07-23 LAB — URINALYSIS, ROUTINE W REFLEX MICROSCOPIC
Bilirubin Urine: NEGATIVE
GLUCOSE, UA: NEGATIVE mg/dL
Hgb urine dipstick: NEGATIVE
KETONES UR: NEGATIVE mg/dL
LEUKOCYTES UA: NEGATIVE
NITRITE: NEGATIVE
PROTEIN: NEGATIVE mg/dL
Specific Gravity, Urine: 1.011 (ref 1.005–1.030)
pH: 5 (ref 5.0–8.0)

## 2017-07-23 LAB — PROTIME-INR
INR: 1.11
Prothrombin Time: 14.3 seconds (ref 11.4–15.2)

## 2017-07-23 LAB — I-STAT CHEM 8, ED
BUN: 28 mg/dL — ABNORMAL HIGH (ref 6–20)
CHLORIDE: 104 mmol/L (ref 101–111)
Calcium, Ion: 1.09 mmol/L — ABNORMAL LOW (ref 1.15–1.40)
Creatinine, Ser: 1.1 mg/dL — ABNORMAL HIGH (ref 0.44–1.00)
GLUCOSE: 248 mg/dL — AB (ref 65–99)
HEMATOCRIT: 33 % — AB (ref 36.0–46.0)
HEMOGLOBIN: 11.2 g/dL — AB (ref 12.0–15.0)
POTASSIUM: 3.4 mmol/L — AB (ref 3.5–5.1)
SODIUM: 141 mmol/L (ref 135–145)
TCO2: 24 mmol/L (ref 0–100)

## 2017-07-23 LAB — COMPREHENSIVE METABOLIC PANEL
ALT: 15 U/L (ref 14–54)
ANION GAP: 10 (ref 5–15)
AST: 22 U/L (ref 15–41)
Albumin: 3.3 g/dL — ABNORMAL LOW (ref 3.5–5.0)
Alkaline Phosphatase: 92 U/L (ref 38–126)
BILIRUBIN TOTAL: 0.4 mg/dL (ref 0.3–1.2)
BUN: 28 mg/dL — ABNORMAL HIGH (ref 6–20)
CALCIUM: 8.6 mg/dL — AB (ref 8.9–10.3)
CO2: 25 mmol/L (ref 22–32)
Chloride: 104 mmol/L (ref 101–111)
Creatinine, Ser: 1.17 mg/dL — ABNORMAL HIGH (ref 0.44–1.00)
GFR calc Af Amer: 54 mL/min — ABNORMAL LOW (ref >60)
GFR, EST NON AFRICAN AMERICAN: 47 mL/min — AB (ref >60)
Glucose, Bld: 244 mg/dL — ABNORMAL HIGH (ref 65–99)
POTASSIUM: 3.4 mmol/L — AB (ref 3.5–5.1)
Sodium: 139 mmol/L (ref 135–145)
TOTAL PROTEIN: 7 g/dL (ref 6.5–8.1)

## 2017-07-23 LAB — LACTIC ACID, PLASMA: LACTIC ACID, VENOUS: 2.1 mmol/L — AB (ref 0.5–1.9)

## 2017-07-23 LAB — I-STAT CG4 LACTIC ACID, ED: LACTIC ACID, VENOUS: 2.17 mmol/L — AB (ref 0.5–1.9)

## 2017-07-23 LAB — PROCALCITONIN: PROCALCITONIN: 2.36 ng/mL

## 2017-07-23 LAB — BRAIN NATRIURETIC PEPTIDE: B NATRIURETIC PEPTIDE 5: 25.4 pg/mL (ref 0.0–100.0)

## 2017-07-23 LAB — GLUCOSE, CAPILLARY
GLUCOSE-CAPILLARY: 171 mg/dL — AB (ref 65–99)
GLUCOSE-CAPILLARY: 152 mg/dL — AB (ref 65–99)

## 2017-07-23 MED ORDER — VANCOMYCIN HCL 10 G IV SOLR
1250.0000 mg | INTRAVENOUS | Status: AC
  Administered 2017-07-24 – 2017-07-27 (×4): 1250 mg via INTRAVENOUS
  Filled 2017-07-23 (×4): qty 1250

## 2017-07-23 MED ORDER — INSULIN DETEMIR 100 UNIT/ML ~~LOC~~ SOLN
40.0000 [IU] | Freq: Every day | SUBCUTANEOUS | Status: DC
  Administered 2017-07-23 – 2017-08-01 (×8): 40 [IU] via SUBCUTANEOUS
  Filled 2017-07-23 (×11): qty 0.4

## 2017-07-23 MED ORDER — SODIUM CHLORIDE 0.9 % IV BOLUS (SEPSIS)
1000.0000 mL | Freq: Once | INTRAVENOUS | Status: AC
  Administered 2017-07-23: 1000 mL via INTRAVENOUS

## 2017-07-23 MED ORDER — INSULIN ASPART 100 UNIT/ML ~~LOC~~ SOLN
0.0000 [IU] | Freq: Every day | SUBCUTANEOUS | Status: DC
  Administered 2017-07-29 – 2017-08-01 (×2): 2 [IU] via SUBCUTANEOUS

## 2017-07-23 MED ORDER — SODIUM CHLORIDE 0.9 % IV SOLN
INTRAVENOUS | Status: DC
  Administered 2017-07-23 – 2017-07-27 (×7): via INTRAVENOUS

## 2017-07-23 MED ORDER — PIPERACILLIN-TAZOBACTAM 3.375 G IVPB
3.3750 g | Freq: Three times a day (TID) | INTRAVENOUS | Status: DC
  Administered 2017-07-23 – 2017-07-28 (×14): 3.375 g via INTRAVENOUS
  Filled 2017-07-23 (×13): qty 50

## 2017-07-23 MED ORDER — ATORVASTATIN CALCIUM 20 MG PO TABS
20.0000 mg | ORAL_TABLET | Freq: Every day | ORAL | Status: DC
  Administered 2017-07-23 – 2017-08-01 (×10): 20 mg via ORAL
  Filled 2017-07-23 (×7): qty 1
  Filled 2017-07-23: qty 2
  Filled 2017-07-23 (×2): qty 1

## 2017-07-23 MED ORDER — IOPAMIDOL (ISOVUE-300) INJECTION 61%
30.0000 mL | Freq: Once | INTRAVENOUS | Status: AC | PRN
  Administered 2017-07-23: 30 mL via ORAL

## 2017-07-23 MED ORDER — CLOPIDOGREL BISULFATE 75 MG PO TABS
75.0000 mg | ORAL_TABLET | Freq: Every day | ORAL | Status: DC
  Administered 2017-07-23 – 2017-08-02 (×11): 75 mg via ORAL
  Filled 2017-07-23 (×11): qty 1

## 2017-07-23 MED ORDER — IOPAMIDOL (ISOVUE-300) INJECTION 61%
INTRAVENOUS | Status: AC
  Filled 2017-07-23: qty 100

## 2017-07-23 MED ORDER — ACETAMINOPHEN 325 MG PO TABS
650.0000 mg | ORAL_TABLET | Freq: Four times a day (QID) | ORAL | Status: DC | PRN
  Administered 2017-07-23 – 2017-07-28 (×5): 650 mg via ORAL
  Filled 2017-07-23 (×5): qty 2

## 2017-07-23 MED ORDER — VANCOMYCIN HCL 10 G IV SOLR
1500.0000 mg | Freq: Once | INTRAVENOUS | Status: AC
  Administered 2017-07-23: 1500 mg via INTRAVENOUS
  Filled 2017-07-23: qty 1500

## 2017-07-23 MED ORDER — PIPERACILLIN-TAZOBACTAM 3.375 G IVPB 30 MIN
3.3750 g | Freq: Once | INTRAVENOUS | Status: AC
  Administered 2017-07-23: 3.375 g via INTRAVENOUS
  Filled 2017-07-23: qty 50

## 2017-07-23 MED ORDER — LAMOTRIGINE 100 MG PO TABS
100.0000 mg | ORAL_TABLET | Freq: Every day | ORAL | Status: DC
  Administered 2017-07-23 – 2017-08-01 (×10): 100 mg via ORAL
  Filled 2017-07-23 (×10): qty 1

## 2017-07-23 MED ORDER — OXYBUTYNIN CHLORIDE 5 MG PO TABS
5.0000 mg | ORAL_TABLET | Freq: Every day | ORAL | Status: DC
  Administered 2017-07-23 – 2017-08-01 (×10): 5 mg via ORAL
  Filled 2017-07-23 (×10): qty 1

## 2017-07-23 MED ORDER — IOPAMIDOL (ISOVUE-300) INJECTION 61%
100.0000 mL | Freq: Once | INTRAVENOUS | Status: AC | PRN
  Administered 2017-07-23: 80 mL via INTRAVENOUS

## 2017-07-23 MED ORDER — ACETAMINOPHEN 500 MG PO TABS
1000.0000 mg | ORAL_TABLET | Freq: Once | ORAL | Status: AC
  Administered 2017-07-23: 1000 mg via ORAL
  Filled 2017-07-23: qty 2

## 2017-07-23 MED ORDER — ENOXAPARIN SODIUM 40 MG/0.4ML ~~LOC~~ SOLN
40.0000 mg | SUBCUTANEOUS | Status: DC
  Administered 2017-07-23: 40 mg via SUBCUTANEOUS
  Filled 2017-07-23: qty 0.4

## 2017-07-23 MED ORDER — INSULIN ASPART 100 UNIT/ML ~~LOC~~ SOLN
0.0000 [IU] | Freq: Three times a day (TID) | SUBCUTANEOUS | Status: DC
  Administered 2017-07-23: 4 [IU] via SUBCUTANEOUS
  Administered 2017-07-24: 7 [IU] via SUBCUTANEOUS
  Administered 2017-07-24 (×2): 4 [IU] via SUBCUTANEOUS
  Administered 2017-07-25: 3 [IU] via SUBCUTANEOUS
  Administered 2017-07-25: 4 [IU] via SUBCUTANEOUS
  Administered 2017-07-26: 3 [IU] via SUBCUTANEOUS
  Administered 2017-07-28: 11 [IU] via SUBCUTANEOUS
  Administered 2017-07-28 (×2): 3 [IU] via SUBCUTANEOUS
  Administered 2017-07-29: 4 [IU] via SUBCUTANEOUS
  Administered 2017-07-29 (×2): 7 [IU] via SUBCUTANEOUS
  Administered 2017-07-30: 3 [IU] via SUBCUTANEOUS
  Administered 2017-07-30 (×2): 4 [IU] via SUBCUTANEOUS
  Administered 2017-07-31: 3 [IU] via SUBCUTANEOUS
  Administered 2017-08-01 (×2): 4 [IU] via SUBCUTANEOUS
  Administered 2017-08-01: 3 [IU] via SUBCUTANEOUS
  Administered 2017-08-02: 4 [IU] via SUBCUTANEOUS
  Administered 2017-08-02: 3 [IU] via SUBCUTANEOUS

## 2017-07-23 MED ORDER — IOPAMIDOL (ISOVUE-300) INJECTION 61%
INTRAVENOUS | Status: AC
  Filled 2017-07-23: qty 30

## 2017-07-23 MED ORDER — DULOXETINE HCL 30 MG PO CPEP
60.0000 mg | ORAL_CAPSULE | Freq: Every day | ORAL | Status: DC
  Administered 2017-07-23 – 2017-08-02 (×11): 60 mg via ORAL
  Filled 2017-07-23 (×11): qty 2

## 2017-07-23 MED ORDER — MORPHINE SULFATE (PF) 4 MG/ML IV SOLN
2.0000 mg | INTRAVENOUS | Status: DC | PRN
  Administered 2017-07-23 – 2017-08-01 (×15): 2 mg via INTRAVENOUS
  Filled 2017-07-23 (×16): qty 1

## 2017-07-23 NOTE — ED Triage Notes (Signed)
[facility] today with fever and pain all over.  Staff reports altered level of conciousness.  EMS gave 500ml of NS and HR is now 110.  CBG-291.  Temp. 101.9  Patient has wound on her left foot.  She is a diabetic and blood sugars are usually high according to staff at facility.

## 2017-07-23 NOTE — Progress Notes (Addendum)
Report given to April RN floor nurse. Patient's sister is at bedside. Patient's daughter is the first contact. Contact phone number is in chart.

## 2017-07-23 NOTE — ED Notes (Signed)
Call report to April at [phone] at [phone]

## 2017-07-23 NOTE — ED Provider Notes (Addendum)
[facility]-EMERGENCY DEPT Provider Note   CSN: [id number] Arrival date & time: 07/23/17  1010     History   Chief Complaint Chief Complaint  Patient presents with  . Fever    HPI Laurie [name] is a 71 y.o. female.  71 yo F with a chief complaint of a fever. The started this morning. Having aches all over. Denies cough congestion vomiting diarrhea abdominal pain. Denies dysuria or increased frequency or hesitancy. She has a wound to her left foot that is being seen by wound care. She is having dressing changes every other day. He was just dressed this morning. Had been looking much better as well as told the patient.   The history is provided by the patient.  Fever   Pertinent negatives include no chest pain, no vomiting, no congestion and no headaches.  Illness  This is a new problem. The current episode started 1 to 2 hours ago. The problem occurs constantly. The problem has not changed since onset.Pertinent negatives include no chest pain, no headaches and no shortness of breath. Nothing aggravates the symptoms. Nothing relieves the symptoms. She has tried nothing for the symptoms. The treatment provided no relief.    Past Medical History:  Diagnosis Date  . Anemia   . Anxiety   . Bacteremia   . Bursitis    "left hip; sometimes left shoulder" (03/05/2015)  . Chest pain    Troponins up to 0.12 while hospitalized for E.coli PNA  . Depression   . Family history of adverse reaction to anesthesia    "brother died 06/24/2013; woke up w/headache, vomited blood; drilled holes in his head; bleeding stroke; never came back" (03/05/2015)  . Headache    "when someone gets on my nerves" (03/05/2015)  . High cholesterol   . Hypertension   . Narcolepsy   . 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 Stroke [facility](HCC) 10/2014   "weaker on left side since" (03/05/2015)  . Type II diabetes mellitus (HCC)   . Walking pneumonia 04/2009   "double" /notes 04/23/2011    Patient Active  Problem List   Diagnosis Date Noted  . Sepsis (HCC) 07/23/2017  . Atherosclerosis of native arteries of the extremities with ulceration (HCC) 01/21/2017  . Pressure injury of skin 12/03/2016  . PVD (peripheral vascular disease) (HCC) 03/05/2015  . S/P BKA (below knee amputation) (HCC) 11/23/2014  . Depression 11/11/2014  . Gangrene of foot (HCC)   . Hypokalemia   . Peripheral vascular disease (HCC) 11/02/2014  . Gangrene (HCC) 11/02/2014  . Wound, surgical, infected 11/01/2014  . Diabetic foot ulcer (HCC) 11/01/2014  . Trochanteric bursitis of left hip 05/29/2014  . Spastic hemiplegia affecting nondominant side (HCC) 12/02/2013  . Chronic diastolic heart failure, grade 2 10/26/2013  . Dyslipidemia 10/26/2013  . CVA (cerebral infarction) 10/25/2013  . Arthralgia of hip 10/25/2013  . Anemia 05/17/2009  . Diabetes mellitus type 2 with peripheral artery disease (HCC) 05/08/2009  . Anxiety state 05/08/2009  . Essential hypertension 05/08/2009    Past Surgical History:  Procedure Laterality Date  . ABDOMINAL AORTAGRAM N/A 03/05/2015   Procedure: ABDOMINAL Ronny FlurryAORTAGRAM;  Surgeon: Chuck Hinthristopher S Dickson, MD;  [facility];  Service: Cardiovascular;  Laterality: N/A;  . AMPUTATION Right 11/03/2014   Procedure: AMPUTATION BELOW KNEE;  Surgeon: Sherren Kernsharles E Fields, MD;  [facility];  Service: Vascular;  Laterality: Right;  . FEMORAL-TIBIAL BYPASS GRAFT Left 03/06/2015   Procedure: BYPASS GRAFT Left  FEMORAL-POSTERIOR TIBIAL ARTERY with reversed saphenous vein;  Surgeon: Sherren Kerns, MD;  Location: [facility] OR;  Service: Vascular;  Laterality: Left;  . INTRAOPERATIVE ARTERIOGRAM Left 03/06/2015   Procedure: INTRA OPERATIVE ARTERIOGRAM;  Surgeon: Sherren Kerns, MD;  Location: [facility] OR;  Service: Vascular;  Laterality: Left;  . IRRIGATION AND DEBRIDEMENT FOOT Left 12/05/2016   Procedure: IRRIGATION AND DEBRIDEMENT FOOT;  Surgeon: Gwyneth Revels, DPM;  Location: [facility] ORS;  Service: Podiatry;   Laterality: Left;  . LOWER EXTREMITY ANGIOGRAM N/A 10/03/2014   Procedure: LOWER EXTREMITY ANGIOGRAM;  Surgeon: Pamella Pert, MD;  Location: [facility] CATH LAB;  Service: Cardiovascular;  Laterality: N/A;  . PERIPHERAL VASCULAR CATHETERIZATION Left 12/03/2016   Procedure: Lower Extremity Angiography;  Surgeon: Annice Needy, MD;  Location: [facility] INVASIVE CV LAB;  Service: Cardiovascular;  Laterality: Left;    OB History    No data available       Home Medications    Prior to Admission medications   Medication Sig Start Date End Date Taking? Authorizing Provider  amphetamine-dextroamphetamine (ADDERALL) 20 MG tablet Take 20 mg by mouth daily with breakfast. Reported on 07/02/2016   Yes [provider]  ascorbic acid (VITAMIN C) 500 MG tablet Take 500 mg by mouth 3 (three) times daily.    Yes [provider]  atorvastatin (LIPITOR) 10 MG tablet Take 1 tablet (10 mg total) by mouth daily at 6 PM. Patient taking differently: Take 20 mg by mouth daily at 6 PM.  11/04/13  Yes Love, Evlyn Kanner, PA-C  cloNIDine (CATAPRES) 0.2 MG tablet Take 0.2 mg by mouth 3 (three) times daily.   Yes [provider]  clopidogrel (PLAVIX) 75 MG tablet Take 75 mg by mouth daily.   Yes [provider]  diclofenac sodium (VOLTAREN) 1 % GEL Apply 4 g topically 4 (four) times daily. 06/20/16  Yes Palumbo, April, MD  DULoxetine (CYMBALTA) 60 MG capsule Take 60 mg by mouth daily.   Yes [provider]  gabapentin (NEURONTIN) 300 MG capsule Take 1 capsule (300 mg total) by mouth 2 (two) times daily. Patient taking differently: Take 400 mg by mouth 2 (two) times daily.  03/13/14  Yes Kirsteins, Victorino Sparrow, MD  HYDROcodone-acetaminophen (NORCO/VICODIN) 5-325 MG tablet Take 1-2 tablets by mouth every 4 (four) hours as needed for moderate pain. Patient taking differently: Take 1-2 tablets by mouth every 8 (eight) hours as needed for moderate pain.  12/08/16  Yes Auburn Bilberry, MD  insulin  aspart (NOVOLOG) 100 UNIT/ML injection Inject 0-12 Units into the skin 4 (four) times daily. Sliding scale-  <200= 0 units 201-250= 2 units 251-300= 4 units 301-350= 6 units 351-400=8 units 401-450= 10 units 451-500= 12 units >501= call MD   Yes [provider]  insulin detemir (LEVEMIR) 100 UNIT/ML injection Inject 55 Units into the skin at bedtime.   Yes [provider]  lamoTRIgine (LAMICTAL) 100 MG tablet Take 100 mg by mouth at bedtime.    Yes [provider]  lisinopril (PRINIVIL,ZESTRIL) 20 MG tablet Take 1 tablet (20 mg total) by mouth daily. 11/07/14  Yes Jeralyn Bennett, MD  Multiple Vitamins-Minerals (CERTAGEN PO) Take 1 tablet by mouth daily.    Yes [provider]  oxybutynin (DITROPAN) 5 MG tablet Take 5 mg by mouth at bedtime.   Yes [provider]  Polyethyl Glycol-Propyl Glycol (SYSTANE) 0.4-0.3 % SOLN Apply to eye.   Yes [provider]  senna-docusate (SENOKOT-S) 8.6-50 MG tablet Take 1 tablet  by mouth at bedtime as needed for mild constipation. Patient taking differently: Take 2 tablets by mouth at bedtime.  12/08/16  Yes Auburn Bilberry, MD  torsemide (DEMADEX) 100 MG tablet Take 100 mg by mouth daily.   Yes [provider]  ALPRAZolam Prudy Feeler) 0.25 MG tablet Take one tablet by mouth twice daily as needed for anxiety Patient not taking: Reported on 07/23/2017 12/08/16   Auburn Bilberry, MD  enoxaparin (LOVENOX) 40 MG/0.4ML injection Inject 0.4 mLs (40 mg total) into the skin daily. Patient not taking: Reported on 07/23/2017 12/08/16 12/23/16  Auburn Bilberry, MD  insulin glargine (LANTUS) 100 UNIT/ML injection Inject 0.2 mLs (20 Units total) into the skin at bedtime. Patient not taking: Reported on 07/23/2017 11/07/14   Jeralyn Bennett, MD    Family History Family History  Problem Relation Age of Onset  . CAD Mother   . CAD Father     Social History Social History  Substance Use Topics  . Smoking status:  Former Smoker    Packs/day: 0.10    Years: 40.00    Types: Cigarettes    Quit date: 11/21/2016  . Smokeless tobacco: Never Used  . Alcohol use 0.0 oz/week     Comment: 03/05/2015 "Might have a drink on holidays"     Allergies   Penicillins   Review of Systems Review of Systems  Constitutional: Positive for fever. Negative for chills.  HENT: Negative for congestion and rhinorrhea.   Eyes: Negative for redness and visual disturbance.  Respiratory: Negative for shortness of breath and wheezing.   Cardiovascular: Negative for chest pain and palpitations.  Gastrointestinal: Negative for nausea and vomiting.  Genitourinary: Negative for dysuria and urgency.  Musculoskeletal: Positive for arthralgias and myalgias.  Skin: Negative for pallor and wound.  Neurological: Negative for dizziness and headaches.     Physical Exam Updated Vital Signs BP 107/61 (BP Location: Right Arm)   Pulse (!) 115   Temp (!) 100.6 F (38.1 C) (Oral)   Resp 18   SpO2 94%   Physical Exam  Constitutional: She is oriented to person, place, and time. She appears well-developed and well-nourished. No distress.  HENT:  Head: Normocephalic and atraumatic.  Eyes: Pupils are equal, round, and reactive to light. EOM are normal.  Neck: Normal range of motion. Neck supple.  Cardiovascular: Regular rhythm.  Tachycardia present.  Exam reveals no gallop and no friction rub.   No murmur heard. Pulmonary/Chest: Effort normal. She has no wheezes. She has no rales.  Abdominal: Soft. She exhibits no distension and no mass. There is no tenderness. There is no guarding.  Musculoskeletal: She exhibits no edema or tenderness.  Right AKA. Left foot with large ulcer to the heel no noted Drainage no surrounding erythema. Appears superficial.  Neurological: She is alert and oriented to person, place, and time.  Skin: Skin is warm and dry. She is not diaphoretic.  Psychiatric: She has a normal mood and affect. Her behavior  is normal.  Nursing note and vitals reviewed.    ED Treatments / Results  Labs (all labs ordered are listed, but only abnormal results are displayed) Labs Reviewed  COMPREHENSIVE METABOLIC PANEL - Abnormal; Notable for the following:       Result Value   Potassium 3.4 (*)    Glucose, Bld 244 (*)    BUN 28 (*)    Creatinine, Ser 1.17 (*)    Calcium 8.6 (*)    Albumin 3.3 (*)    GFR calc non Af  Amer 47 (*)    GFR calc Af Amer 54 (*)    All other components within normal limits  CBC WITH DIFFERENTIAL/PLATELET - Abnormal; Notable for the following:    RBC 3.69 (*)    Hemoglobin 11.1 (*)    HCT 33.9 (*)    Neutro Abs 9.4 (*)    Lymphs Abs 0.4 (*)    All other components within normal limits  I-STAT CG4 LACTIC ACID, ED - Abnormal; Notable for the following:    Lactic Acid, Venous 2.17 (*)    All other components within normal limits  I-STAT CHEM 8, ED - Abnormal; Notable for the following:    Potassium 3.4 (*)    BUN 28 (*)    Creatinine, Ser 1.10 (*)    Glucose, Bld 248 (*)    Calcium, Ion 1.09 (*)    Hemoglobin 11.2 (*)    HCT 33.0 (*)    All other components within normal limits  CULTURE, BLOOD (ROUTINE X 2)  CULTURE, BLOOD (ROUTINE X 2)  URINE CULTURE  URINALYSIS, ROUTINE W REFLEX MICROSCOPIC  BRAIN NATRIURETIC PEPTIDE  PROCALCITONIN  I-STAT CG4 LACTIC ACID, ED    EKG  EKG Interpretation  Date/Time:  Thursday July 23 2017 12:27:50 EDT Ventricular Rate:  120 PR Interval:    QRS Duration: 102 QT Interval:  335 QTC Calculation: 474 R Axis:   -42 Text Interpretation:  Sinus tachycardia Ventricular trigeminy Left axis deviation Anteroseptal infarct, old Since last tracing rate faster Otherwise no significant change Confirmed by Melene PlanFloyd, Aarionna Germer [phone]([id number]) on 07/23/2017 12:42:00 PM       Radiology Dg Chest 2 View  Result Date: 07/23/2017 CLINICAL DATA:  Sepsis EXAM: CHEST  2 VIEW COMPARISON:  08/30/2009 FINDINGS: Mild cardiomegaly and vascular congestion. No confluent  opacities or effusions. No acute bony abnormality. IMPRESSION: Mild cardiomegaly and vascular congestion. Electronically Signed   By: Charlett NoseKevin  Dover M.D.   On: 07/23/2017 11:08   Dg Ankle Complete Left  Result Date: 07/23/2017 CLINICAL DATA:  Left ankle pain. EXAM: LEFT ANKLE COMPLETE - 3+ VIEW COMPARISON:  Left ankle MRI dated December 04, 2016. FINDINGS: There is no evidence of fracture, dislocation, or joint effusion. The talar dome and ankle mortise are intact. Mild tibiotalar note degenerative changes. Small Achilles and plantar enthesophytes. Surgical clips noted in the lower leg. Mild benign-appearing periosteal reaction along the fibula, nonspecific, but possibly due to venous insufficiency. Small soft tissue defect over the heel. IMPRESSION: 1. Small soft tissue defect over the heel. No radiographic evidence of osteomyelitis. Electronically Signed   By: Obie DredgeWilliam T Derry M.D.   On: 07/23/2017 12:57    Procedures Procedures (including critical care time)  Medications Ordered in ED Medications  vancomycin (VANCOCIN) 1,500 mg in sodium chloride 0.9 % 500 mL IVPB (1,500 mg Intravenous New Bag/Given 07/23/17 1250)  acetaminophen (TYLENOL) tablet 1,000 mg (1,000 mg Oral Given 07/23/17 1128)  sodium chloride 0.9 % bolus 1,000 mL (0 mLs Intravenous Stopped 07/23/17 1200)  piperacillin-tazobactam (ZOSYN) IVPB 3.375 g (3.375 g Intravenous New Bag/Given 07/23/17 1251)     Initial Impression / Assessment and Plan / ED Course  I have reviewed the triage vital signs and the nursing notes.  Pertinent labs & imaging results that were available during my care of the patient were reviewed by me and considered in my medical decision making (see chart for details).     71 yo F With a chief complaint of fevers and chills. Going on for just the past  few hours. Will obtain a laboratory evaluation. Patient tachycardic into the 120s on my evaluation. We'll give fluids and Tylenol.  Persistently tachycardiac.  Continues to look unwell despite normal temp.  CXR and UA unremarkable maybe due to acute onset of symptoms.  Started on broad spectrum abx.  No concerning symptoms consistent with meningitis/encephalitis. Admit.   The patients results and plan were reviewed and discussed.   Any x-rays performed were independently reviewed by myself.   Differential diagnosis were considered with the presenting HPI.  Medications  vancomycin (VANCOCIN) 1,500 mg in sodium chloride 0.9 % 500 mL IVPB (1,500 mg Intravenous New Bag/Given 07/23/17 1250)  acetaminophen (TYLENOL) tablet 1,000 mg (1,000 mg Oral Given 07/23/17 1128)  sodium chloride 0.9 % bolus 1,000 mL (0 mLs Intravenous Stopped 07/23/17 1200)  piperacillin-tazobactam (ZOSYN) IVPB 3.375 g (3.375 g Intravenous New Bag/Given 07/23/17 1251)    Vitals:   07/23/17 1019 07/23/17 1022 07/23/17 1232 07/23/17 1334  BP: 133/69  (!) 141/62 107/61  Pulse: (!) 117  (!) 118 (!) 115  Resp: 18  18 18   Temp: (!) 100.4 F (38 C)  99.1 F (37.3 C) (!) 100.6 F (38.1 C)  TempSrc: Oral  Oral Oral  SpO2: 90% 95% 96% 94%    Final diagnoses:  Fever in adult  Fever of unknown origin    Admission/ observation were discussed with the admitting physician, patient and/or family and they are comfortable with the plan.    Final Clinical Impressions(s) / ED Diagnoses   Final diagnoses:  Fever in adult  Fever of unknown origin    New Prescriptions New Prescriptions   No medications on file     Melene Plan, DO 07/23/17 1352    Melene Plan, DO 07/23/17 1353

## 2017-07-23 NOTE — Progress Notes (Signed)
CRITICAL VALUE ALERT  Critical Value:  Lactic of 2.1  Date & Time Notied:  07/23/17 23:27  Provider Notified: Dr Selena BattenKim  Orders Received/Actions taken: No new orders

## 2017-07-23 NOTE — H&P (Addendum)
History and Physical  [name]:[id number] DOB: [birth year] DOA: 07/23/2017  Referring physician: Melene Plan, ER physician  PCP: Rometta Emery, MD  Outpatient Specialists: None Patient coming from: [facility] & is able to ambulate using a wheelchair  Chief Complaint: Hurting all over   HPI: Laurie [name] is a 71 y.o. female with medical history significant of chronic diastolic heart failure, diabetes mellitus and status post right BKA who presented to the emergency room with complaints of hurting all over patient states she woke up this morning he started feeling very feverish. She then said that she felt like her whole body was aching. Denied any dysuria or shortness of breath although did note that in the last day when she had a bowel movement, it was very liquidy. She had no abdominal pain or cramping though.  ED Course: In the emergency room, patient's blood pressures are noted to be soft with a systolic in the 100s. She is borderline tachycardic with temp of 101. Lab work noted normal white count with 91% neutrophils but a lactic acid level 2.17.  Patient's creatinine slightly elevated at 1.17 with a baseline of 0.87 last year. CBGs in the mid 200s. Patient had chest x-ray and a left ankle x-ray (to look at a chronic left heel wound), but both were on revealing. No signs of pneumonia, effusions and no signs of osteomyelitis. Patient had blood cultures drawn, started on broad-spectrum antibiotics and she was given IV fluid bolus. Hospitalists were called for further evaluation and admission  Review of Systems: Patient seen following arrival to floor . Pt complains of hurting all over, especially in her left hip or she has chronic bursitis. She notes some liquid stool which started yesterday. She does complain of feeling cold. She denies any headaches, vision changes, dysphagia, chest pain, palpitations, shortness of breath, wheeze, cough, abdominal pain, hematuria,  dysuria, constipation. She complains of a chronic left heel ulcer although she says she was told it looks good and she follows with wound care regularly. Review of systems is otherwise negative.    Past Medical History:  Diagnosis Date  . Anemia   . Anxiety   . Bacteremia   . Bursitis    "left hip; sometimes left shoulder" (03/05/2015)  . Chest pain    Troponins up to 0.12 while hospitalized for E.coli PNA  . Depression   . Family history of adverse reaction to anesthesia    "brother died July 18, 2013; woke up w/headache, vomited blood; drilled holes in his head; bleeding stroke; never came back" (03/05/2015)  . Headache    "when someone gets on my nerves" (03/05/2015)  . High cholesterol   . Hypertension   . Narcolepsy   . 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 Stroke [facility]) 10/2014   "weaker on left side since" (03/05/2015)  . Type II diabetes mellitus (HCC)   . Walking pneumonia 04/2009   "double" /notes 04/23/2011   Past Surgical History:  Procedure Laterality Date  . ABDOMINAL AORTAGRAM N/A 03/05/2015   Procedure: ABDOMINAL Ronny Flurry;  Surgeon: Chuck Hint, MD;  Location[facility] CATH LAB;  Service: Cardiovascular;  Laterality: N/A;  . AMPUTATION Right 11/03/2014   Procedure: AMPUTATION BELOW KNEE;  Surgeon: Sherren Kerns, MD;  Location: [facility] OR;  Service: Vascular;  Laterality: Right;  . FEMORAL-TIBIAL BYPASS GRAFT Left 03/06/2015   Procedure: BYPASS GRAFT Left FEMORAL-POSTERIOR TIBIAL ARTERY with reversed saphenous vein;  Surgeon: Sherren Kerns,  MD;  Location: [facility] OR;  Service: Vascular;  Laterality: Left;  . INTRAOPERATIVE ARTERIOGRAM Left 03/06/2015   Procedure: INTRA OPERATIVE ARTERIOGRAM;  Surgeon: Sherren Kerns, MD;  Location: [facility] OR;  Service: Vascular;  Laterality: Left;  . IRRIGATION AND DEBRIDEMENT FOOT Left 12/05/2016   Procedure: IRRIGATION AND DEBRIDEMENT FOOT;  Surgeon: Gwyneth Revels, DPM;  Location: [facility] ORS;  Service: Podiatry;  Laterality:  Left;  . LOWER EXTREMITY ANGIOGRAM N/A 10/03/2014   Procedure: LOWER EXTREMITY ANGIOGRAM;  Surgeon: Pamella Pert, MD;  Location[facility] CATH LAB;  Service: Cardiovascular;  Laterality: N/A;  . PERIPHERAL VASCULAR CATHETERIZATION Left 12/03/2016   Procedure: Lower Extremity Angiography;  Surgeon: Annice Needy, MD;  Location: [facility] INVASIVE CV LAB;  Service: Cardiovascular;  Laterality: Left;    Social History:  reports that she quit smoking about 8 months ago. Her smoking use included Cigarettes. She has a 4.00 pack-year smoking history. She has never used smokeless tobacco. She reports that she drinks alcohol. She reports that she does not use drugs.   Allergies  Allergen Reactions  . Penicillins Hives    Childhood allergy could have been more reaction only remembers hives.     Family History  Problem Relation Age of Onset  . CAD Mother   . CAD Father       Prior to Admission medications   Medication Sig Start Date End Date Taking? Authorizing Provider  amphetamine-dextroamphetamine (ADDERALL) 20 MG tablet Take 20 mg by mouth daily with breakfast. Reported on 07/02/2016   Yes [provider]  ascorbic acid (VITAMIN C) 500 MG tablet Take 500 mg by mouth 3 (three) times daily.    Yes [provider]  atorvastatin (LIPITOR) 10 MG tablet Take 1 tablet (10 mg total) by mouth daily at 6 PM. Patient taking differently: Take 20 mg by mouth daily at 6 PM.  11/04/13  Yes Love, Evlyn Kanner, PA-C  cloNIDine (CATAPRES) 0.2 MG tablet Take 0.2 mg by mouth 3 (three) times daily.   Yes [provider]  clopidogrel (PLAVIX) 75 MG tablet Take 75 mg by mouth daily.   Yes [provider]  diclofenac sodium (VOLTAREN) 1 % GEL Apply 4 g topically 4 (four) times daily. 06/20/16  Yes Palumbo, April, MD  DULoxetine (CYMBALTA) 60 MG capsule Take 60 mg by mouth daily.   Yes [provider]  gabapentin (NEURONTIN) 300 MG capsule Take 1 capsule (300 mg total) by mouth 2  (two) times daily. Patient taking differently: Take 400 mg by mouth 2 (two) times daily.  03/13/14  Yes Kirsteins, Victorino Sparrow, MD  HYDROcodone-acetaminophen (NORCO/VICODIN) 5-325 MG tablet Take 1-2 tablets by mouth every 4 (four) hours as needed for moderate pain. Patient taking differently: Take 1-2 tablets by mouth every 8 (eight) hours as needed for moderate pain.  12/08/16  Yes Auburn Bilberry, MD  insulin aspart (NOVOLOG) 100 UNIT/ML injection Inject 0-12 Units into the skin 4 (four) times daily. Sliding scale-  <200= 0 units 201-250= 2 units 251-300= 4 units 301-350= 6 units 351-400=8 units 401-450= 10 units 451-500= 12 units >501= call MD   Yes [provider]  insulin detemir (LEVEMIR) 100 UNIT/ML injection Inject 55 Units into the skin at bedtime.   Yes [provider]  lamoTRIgine (LAMICTAL) 100 MG tablet Take 100 mg by mouth at bedtime.    Yes [provider]  lisinopril (PRINIVIL,ZESTRIL) 20 MG tablet Take 1 tablet (20 mg total) by mouth daily. 11/07/14  Yes Jeralyn Bennett, MD  Multiple Vitamins-Minerals (CERTAGEN PO) Take 1 tablet by mouth daily.    Yes [provider]  oxybutynin (DITROPAN) 5 MG tablet Take 5 mg by mouth at bedtime.   Yes [provider]  Polyethyl Glycol-Propyl Glycol (SYSTANE) 0.4-0.3 % SOLN Apply to eye.   Yes [provider]  senna-docusate (SENOKOT-S) 8.6-50 MG tablet Take 1 tablet by mouth at bedtime as needed for mild constipation. Patient taking differently: Take 2 tablets by mouth at bedtime.  12/08/16  Yes Auburn BilberryPatel, Shreyang, MD  torsemide (DEMADEX) 100 MG tablet Take 100 mg by mouth daily.   Yes [provider]  enoxaparin (LOVENOX) 40 MG/0.4ML injection Inject 0.4 mLs (40 mg total) into the skin daily. Patient not taking: Reported on 07/23/2017 12/08/16 12/23/16  Auburn BilberryPatel, Shreyang, MD    Physical Exam: BP 108/73 (BP Location: Right Arm)   Pulse (!) 117   Temp 100.1 F (37.8 C) (Axillary)    Resp 17   Ht 5\' 7"  (1.702 m)   Wt 100.7 kg (222 lb 0.1 oz)   SpO2 98%   BMI 34.77 kg/m   General:  Alert and oriented 3, moderate distress secondary to pain Eyes: Sclera nonicteric, extraocular movements are intact  ENT: Normocephalic, atraumatic, mucous membranes are dry Neck: Supple, no JVD  Cardiovascular: Regular rate and rhythm, borderline tachycardia  Respiratory: Clear to auscultation bilaterally, she's breathing is not labored  Abdomen: Soft, obese, nontender, hypoactive bowel sounds  Skin: Left heel ulcerated extending an inch fully around, stage III.    Musculoskeletal: No clubbing or cyanosis or edema. Status post right AKA  Psychiatric: Patient is appropriate, no evidence of psychoses  Neurologic: No focal deficits           Labs on Admission:  Basic Metabolic Panel:  Recent Labs Lab 07/23/17 1048 07/23/17 1049  NA 141 139  K 3.4* 3.4*  CL 104 104  CO2  --  25  GLUCOSE 248* 244*  BUN 28* 28*  CREATININE 1.10* 1.17*  CALCIUM  --  8.6*   Liver Function Tests:  Recent Labs Lab 07/23/17 1049  AST 22  ALT 15  ALKPHOS 92  BILITOT 0.4  PROT 7.0  ALBUMIN 3.3*   No results for input(s): LIPASE, AMYLASE in the last 168 hours. No results for input(s): AMMONIA in the last 168 hours. CBC:  Recent Labs Lab 07/23/17 1048 07/23/17 1049  WBC  --  10.3  NEUTROABS  --  9.4*  HGB 11.2* 11.1*  HCT 33.0* 33.9*  MCV  --  91.9  PLT  --  272   Cardiac Enzymes: No results for input(s): CKTOTAL, CKMB, CKMBINDEX, TROPONINI in the last 168 hours.  BNP (last 3 results) No results for input(s): BNP in the last 8760 hours.  ProBNP (last 3 results) No results for input(s): PROBNP in the last 8760 hours.  CBG: No results for input(s): GLUCAP in the last 168 hours.  Radiological Exams on Admission: Dg Chest 2 View  Result Date: 07/23/2017 CLINICAL DATA:  Sepsis EXAM: CHEST  2 VIEW COMPARISON:  08/30/2009 FINDINGS: Mild cardiomegaly and vascular congestion. No  confluent opacities or effusions. No acute bony abnormality. IMPRESSION: Mild cardiomegaly and vascular congestion. Electronically Signed   By: Charlett NoseKevin  Dover M.D.   On: 07/23/2017 11:08   Dg Ankle Complete Left  Result Date: 07/23/2017 CLINICAL DATA:  Left ankle pain. EXAM: LEFT ANKLE COMPLETE - 3+ VIEW COMPARISON:  Left ankle MRI dated December 04, 2016. FINDINGS: There is no evidence of fracture,  dislocation, or joint effusion. The talar dome and ankle mortise are intact. Mild tibiotalar note degenerative changes. Small Achilles and plantar enthesophytes. Surgical clips noted in the lower leg. Mild benign-appearing periosteal reaction along the fibula, nonspecific, but possibly due to venous insufficiency. Small soft tissue defect over the heel. IMPRESSION: 1. Small soft tissue defect over the heel. No radiographic evidence of osteomyelitis. Electronically Signed   By: Obie DredgeWilliam T Derry M.D.   On: 07/23/2017 12:57    EKG: Independently reviewed. Sinus tachycardia   Assessment/Plan Present on Admission: . Diabetes mellitus type 2 with peripheral artery disease (HCC) we'll check A1c. In the meantime, have started her home Levemir at a slightly reduced dose plus sliding scale . Anxiety state . Essential hypertension: Holding antihypertensives given soft blood pressures from sepsis . Chronic diastolic heart failure, grade 2: Checking BNP. Note we are aggressively rehydrating her and she may end up needing diuresis following sepsis stabilization . Dyslipidemia: Continue statin . Diabetic foot ulcer (HCC): Followed by wound care. Reportedly looking improved. Wound consult . Sepsis [facility](HCC): Principal problem. Patient meets criteria for sepsis on admission given lactic acidosis, tachycardia, fever with suspected GI source. Pulmonary source and urinary source ruled out. Checking CT scan of abdomen and pelvis. Have also ordered pro-calcitonin level to ensure and dehydration not covering up any pulmonary source.  IV fluids and broad-spectrum antibiotics. Blood cultures drawn and are pending. Obesity: Patient meets criteria with BMI greater than 30  Principal Problem:   Sepsis (HCC) Active Problems:   Diabetes mellitus type 2 with peripheral artery disease (HCC)   Anxiety state   Essential hypertension   Chronic diastolic heart failure, grade 2   Dyslipidemia   Diabetic foot ulcer (HCC)   DVT prophylaxis: Lovenox   Code Status: Full code as per patient   Family Communication: Left message with daughter   Disposition Plan: Here for several days as we stabilize her sepsis   Consults called: None   Admission status: Patient will be in the hospital requiring acute inpatient services well passed to midnight, therefore admit as inpatient     Hollice EspyKRISHNAN,Magaline Steinberg K MD [facility] Pager [phone]  If 7PM-7AM, please contact night-coverage [email or web address] Password [email or web address]  07/23/2017, 3:21 PM

## 2017-07-23 NOTE — Progress Notes (Addendum)
Pharmacy Antibiotic Note  Laurie [name] is a 71 y.o. female admitted on 07/23/2017 with sepsis.  Patient is from nursing home & reports feeling feverish and with diarrhea.  Pharmacy has been consulted for Vancomycin & Zosyn dosing.  07/23/2017:  Low grade fever- Tm 100.26F  Procalcitonin elevated (2.36) & LA elevated (2.17)  Scr elevated above baseline (0.87 in Dec 2017).  NCrCl ~5950ml/min.  Allergy to PCN as a child noted, however patient has already received a dose of Zosyn in ED and has not had any adverse reaction per nursing.   Plan: Vancomycin 1250mg   IV every 24 hours.  Goal trough 15-20 mcg/mL. Zosyn 3.375g IV q8h (4 hour infusion).  Check Vancomycin trough at steady state Monitor renal function (daily scr ordered), cx data, and s/sx of allergic reaction  Height: 5\' 7"  (170.2 cm) Weight: 222 lb 0.1 oz (100.7 kg) IBW/kg (Calculated) : 61.6  Temp (24hrs), Avg:100.1 F (37.8 C), Min:99.1 F (37.3 C), Max:100.6 F (38.1 C)   Recent Labs Lab 07/23/17 1048 07/23/17 1049  WBC  --  10.3  CREATININE 1.10* 1.17*  LATICACIDVEN 2.17*  --     Estimated Creatinine Clearance: 56.1 mL/min (A) (by C-G formula based on SCr of 1.17 mg/dL (H)).    Allergies  Allergen Reactions  . Penicillins Hives    Childhood allergy could have been more reaction only remembers hives.     Antimicrobials this admission: 8/2 Zosyn >>  8/2 Vanc >>   Dose adjustments this admission:  Microbiology results: 8/2 BCx: sent 8/2 UCx: sent   Thank you for allowing pharmacy to be a part of this patient's care.  Elson ClanLilliston, Scotti Kosta Michelle 07/23/2017 3:49 PM

## 2017-07-23 NOTE — ED Notes (Addendum)
Failed blood draw attempt. Both IVS are distal; not sure how to proceed

## 2017-07-23 NOTE — ED Notes (Signed)
Bed: WA21 Expected date:  Expected time:  Means of arrival:  Comments: EMS-fever 

## 2017-07-23 NOTE — ED Notes (Signed)
Abnormal lab result MD Floyd have been made aware 

## 2017-07-24 ENCOUNTER — Encounter (HOSPITAL_COMMUNITY): Payer: Medicare Other

## 2017-07-24 DIAGNOSIS — K769 Liver disease, unspecified: Secondary | ICD-10-CM

## 2017-07-24 DIAGNOSIS — E11628 Type 2 diabetes mellitus with other skin complications: Secondary | ICD-10-CM

## 2017-07-24 DIAGNOSIS — E669 Obesity, unspecified: Secondary | ICD-10-CM

## 2017-07-24 DIAGNOSIS — L03119 Cellulitis of unspecified part of limb: Secondary | ICD-10-CM

## 2017-07-24 LAB — GLUCOSE, CAPILLARY
GLUCOSE-CAPILLARY: 154 mg/dL — AB (ref 65–99)
GLUCOSE-CAPILLARY: 117 mg/dL — AB (ref 65–99)
Glucose-Capillary: 170 mg/dL — ABNORMAL HIGH (ref 65–99)
Glucose-Capillary: 212 mg/dL — ABNORMAL HIGH (ref 65–99)

## 2017-07-24 LAB — BASIC METABOLIC PANEL
ANION GAP: 8 (ref 5–15)
BUN: 20 mg/dL (ref 6–20)
CHLORIDE: 110 mmol/L (ref 101–111)
CO2: 23 mmol/L (ref 22–32)
Calcium: 7.8 mg/dL — ABNORMAL LOW (ref 8.9–10.3)
Creatinine, Ser: 1.07 mg/dL — ABNORMAL HIGH (ref 0.44–1.00)
GFR calc Af Amer: >60 mL/min (ref >60)
GFR, EST NON AFRICAN AMERICAN: 52 mL/min — AB (ref >60)
GLUCOSE: 186 mg/dL — AB (ref 65–99)
POTASSIUM: 3.6 mmol/L (ref 3.5–5.1)
Sodium: 141 mmol/L (ref 135–145)

## 2017-07-24 LAB — CBC
HEMATOCRIT: 31.1 % — AB (ref 36.0–46.0)
HEMOGLOBIN: 10.3 g/dL — AB (ref 12.0–15.0)
MCH: 30.4 pg (ref 26.0–34.0)
MCHC: 33.1 g/dL (ref 30.0–36.0)
MCV: 91.7 fL (ref 78.0–100.0)
PLATELETS: 247 10*3/uL (ref 150–400)
RBC: 3.39 MIL/uL — AB (ref 3.87–5.11)
RDW: 14.3 % (ref 11.5–15.5)
WBC: 18.8 10*3/uL — AB (ref 4.0–10.5)

## 2017-07-24 LAB — URINE CULTURE: Culture: NO GROWTH

## 2017-07-24 LAB — LACTIC ACID, PLASMA: Lactic Acid, Venous: 2.1 mmol/L (ref 0.5–1.9)

## 2017-07-24 MED ORDER — HEPARIN (PORCINE) IN NACL 100-0.45 UNIT/ML-% IJ SOLN
1550.0000 [IU]/h | INTRAMUSCULAR | Status: DC
  Administered 2017-07-24: 1400 [IU]/h via INTRAVENOUS
  Administered 2017-07-25: 1550 [IU]/h via INTRAVENOUS
  Filled 2017-07-24 (×2): qty 250

## 2017-07-24 MED ORDER — HEPARIN BOLUS VIA INFUSION
2500.0000 [IU] | Freq: Once | INTRAVENOUS | Status: AC
  Administered 2017-07-24: 2500 [IU] via INTRAVENOUS
  Filled 2017-07-24: qty 2500

## 2017-07-24 NOTE — NC FL2 (Addendum)
[place] MEDICAID FL2 LEVEL OF CARE SCREENING TOOL     IDENTIFICATION  Patient Name: Laurie [name] Birthdate: 1946/10/16 Sex: female Admission Date (Current Location): 07/23/2017  [facility] [place]   Facility and Address:  [facility],  [address], [place] [id number]      Provider Number: [id number]  Attending Physician Name and Address:  Dimple NanasAmin, Ankit Chirag, MD  Relative Name and Phone Number:       Current Level of Care: Hospital Recommended Level of Care: Skilled Nursing Facility Prior Approval Number:    Date Approved/Denied:   PASRR Number: [id number]  Discharge Plan: SNF    Current Diagnoses: Patient Active Problem List   Diagnosis Date Noted  . Sepsis (HCC) 07/23/2017  . Obesity (BMI 30-39.9) 07/23/2017  . Atherosclerosis of native arteries of the extremities with ulceration (HCC) 01/21/2017  . Pressure injury of skin 12/03/2016  . PVD (peripheral vascular disease) (HCC) 03/05/2015  . S/P BKA (below knee amputation) (HCC) 11/23/2014  . Depression 11/11/2014  . Gangrene of foot (HCC)   . Hypokalemia   . Peripheral vascular disease (HCC) 11/02/2014  . Gangrene (HCC) 11/02/2014  . Wound, surgical, infected 11/01/2014  . Diabetic foot ulcer (HCC) 11/01/2014  . Trochanteric bursitis of left hip 05/29/2014  . Spastic hemiplegia affecting nondominant side (HCC) 12/02/2013  . Chronic diastolic heart failure, grade 2 10/26/2013  . Dyslipidemia 10/26/2013  . CVA (cerebral infarction) 10/25/2013  . Arthralgia of hip 10/25/2013  . Anemia 05/17/2009  . Diabetes mellitus type 2 with peripheral artery disease (HCC) 05/08/2009  . Anxiety state 05/08/2009  . Essential hypertension 05/08/2009    Orientation RESPIRATION BLADDER Height & Weight     Self, Time, Situation, Place  Normal External catheter, Incontinent Weight: 100.7 kg (222 lb 0.1 oz) Height:  5\' 7"  (170.2 cm)  BEHAVIORAL SYMPTOMS/MOOD NEUROLOGICAL BOWEL NUTRITION  STATUS  Other (Comment) (no behaviors)   Continent Diet (see dc summary)  AMBULATORY STATUS COMMUNICATION OF NEEDS Skin   Extensive Assist Verbally  (open wound on left heel.)   Reason for Consult:Chronic, non-healing pressure injury (full thickness, Stage 3) to the left heel. Maceration in the periwound area, moisture associated skin damage. Wound type: pressure with a likely perfusion component as a confounding variable Pressure Injury POA: Yes Measurement: 6.5cm x 9cm x 0.2cm with beefy red wound bed, moist.  No necrotic tissue. Wound bed:See above Drainage (amount, consistency, odor) Small to moderate amount of serous exudate on old dressing. Patient's LE with edema requiring elevation. Periwound:Maceration in the periwound area measuring 1cm in diameter circumferentially. Dressing procedure/placement/frequency: I will implement a pressure redistribution heel boot (Prevalon) and twice daily wound care using an astringent antimicrobial (Xeroform gauze) to begin today.                     Personal Care Assistance Level of Assistance  Bathing, Feeding, Dressing Bathing Assistance: Limited assistance Feeding assistance: Independent Dressing Assistance: Limited assistance     Functional Limitations Info  Sight, Hearing, Speech Sight Info: Adequate Hearing Info: Adequate Speech Info: Adequate    SPECIAL CARE FACTORS FREQUENCY                       Contractures Contractures Info: Not present    Additional Factors Info  Allergies, Psychotropic Code Status Info: Full Code Allergies Info: PENICILLINS Psychotropic Info: cymbalta     Patient needs IV antibiotics, PICC line placed 8/9  No complications  Current Medications (07/24/2017):  This is the current hospital active medication list Current Facility-Administered Medications  Medication Dose Route Frequency Provider Last Rate Last Dose  . 0.9 %  sodium chloride infusion   Intravenous Continuous Hollice EspyKrishnan,  Sendil K, MD 100 mL/hr at 07/24/17 0221    . acetaminophen (TYLENOL) tablet 650 mg  650 mg Oral Q6H PRN Hollice EspyKrishnan, Sendil K, MD   650 mg at 07/24/17 [phone]  . atorvastatin (LIPITOR) tablet 20 mg  20 mg Oral q1800 Hollice EspyKrishnan, Sendil K, MD   20 mg at 07/23/17 1647  . clopidogrel (PLAVIX) tablet 75 mg  75 mg Oral Daily Hollice EspyKrishnan, Sendil K, MD   75 mg at 07/24/17 0805  . DULoxetine (CYMBALTA) DR capsule 60 mg  60 mg Oral Daily Hollice EspyKrishnan, Sendil K, MD   60 mg at 07/24/17 0805  . enoxaparin (LOVENOX) injection 40 mg  40 mg Subcutaneous Q24H Hollice EspyKrishnan, Sendil K, MD   40 mg at 07/23/17 2309  . insulin aspart (novoLOG) injection 0-20 Units  0-20 Units Subcutaneous TID WC Hollice EspyKrishnan, Sendil K, MD   4 Units at 07/24/17 0805  . insulin aspart (novoLOG) injection 0-5 Units  0-5 Units Subcutaneous QHS Hollice EspyKrishnan, Sendil K, MD      . insulin detemir (LEVEMIR) injection 40 Units  40 Units Subcutaneous QHS Hollice EspyKrishnan, Sendil K, MD   40 Units at 07/23/17 2312  . lamoTRIgine (LAMICTAL) tablet 100 mg  100 mg Oral QHS Hollice EspyKrishnan, Sendil K, MD   100 mg at 07/23/17 2312  . morphine 4 MG/ML injection 2 mg  2 mg Intravenous Q3H PRN Hollice EspyKrishnan, Sendil K, MD   2 mg at 07/24/17 [id number]  . oxybutynin (DITROPAN) tablet 5 mg  5 mg Oral QHS Hollice EspyKrishnan, Sendil K, MD   5 mg at 07/23/17 2312  . piperacillin-tazobactam (ZOSYN) IVPB 3.375 g  3.375 g Intravenous Q8H Phylliss BlakesLilliston, Andrea M, [facility] HRPH   Stopped at 07/24/17 0945  . vancomycin (VANCOCIN) 1,250 mg in sodium chloride 0.9 % 250 mL IVPB  1,250 mg Intravenous Q24H Phylliss BlakesLilliston, Andrea M, RPH 166.7 mL/hr at 07/24/17 1022 1,250 mg at 07/24/17 1022     Discharge Medications: Please see discharge summary for a list of discharge medications.  Relevant Imaging Results:  Relevant Lab Results:   Additional Information SS # [id number]  Haidinger, Dickey GaveJamie Lee, LCSW

## 2017-07-24 NOTE — Consult Note (Signed)
WOC Nurse wound consult note Reason for Consult:Chronic, non-healing pressure injury (full thickness, Stage 3) to the left heel. Maceration in the periwound area, moisture associated skin damage. Wound type: pressure with a likely perfusion component as a confounding variable Pressure Injury POA: Yes Measurement: 6.5cm x 9cm x 0.2cm with beefy red wound bed, moist.  No necrotic tissue. Wound bed:See above Drainage (amount, consistency, odor) Small to moderate amount of serous exudate on old dressing. Patient's LE with edema requiring elevation. Periwound:Maceration in the periwound area measuring 1cm in diameter circumferentially. Dressing procedure/placement/frequency: I will implement a pressure redistribution heel boot (Prevalon) and twice daily wound care using an astringent antimicrobial (Xeroform gauze) to begin today. WOC nursing team will not follow, but will remain available to this patient, the nursing and medical teams.  Please re-consult if needed. Thanks, Ladona MowLaurie Kreed Kauffman, MSN, RN, GNP, Hans EdenCWOCN, CWON-AP, FAAN  Pager# [phone]

## 2017-07-24 NOTE — Progress Notes (Signed)
Spoke with RN about LE venous order at 1800. Stated this would be performed first in the AM to to high volume of tests ahead of this one tonight. She stated that I should paged Dr. Nelson ChimesAmin to relay this message. MD was paged at 1805 with no call back.  Vascular lab [phone]

## 2017-07-24 NOTE — Progress Notes (Signed)
Discussed w/ Dr Mariel SleetAmin Temp now 100.9 LLE > reddness  And edema. US stat and will consider Hep gtt. Will monitor

## 2017-07-24 NOTE — Progress Notes (Signed)
US of LE ordered as routine in am to rule out DVT but wasn't done. Due to worsening of her LLE redness and swelling in afternoon it was changed to stat. Not done yet, perhaps it appears due to high volume tests per vasc tech. Will go ahead and start the patient on the heparin drip and await US to be done in am.

## 2017-07-24 NOTE — Care Management Note (Signed)
Case Management Note  Patient Details  Name: Druscilla Brownienne W Polidori MRN: [id number] Date of Birth: 03/13/1946  Subjective/Objective:70 y/o f admitted w/Sepsis, L leg wound. Hx: R BKA,w/c bound,DM,chf,hemiplegia,cva, l hip bursitis. From SNF-[facility]-CSW already following.WOC cons-wound care.                    Action/Plan:d/c plan return SNF.   Expected Discharge Date:   (unknown)               Expected Discharge Plan:  Skilled Nursing Facility  In-House Referral:  Clinical Social Work  Discharge planning Services  CM Consult  Post Acute Care Choice:    Choice offered to:     DME Arranged:    DME Agency:     HH Arranged:    HH Agency:     Status of Service:  In process, will continue to follow  If discussed at Long Length of Stay Meetings, dates discussed:    Additional Comments:  Lanier ClamMahabir, Venessa Wickham, RN 07/24/2017, 10:13 AM

## 2017-07-24 NOTE — Progress Notes (Signed)
MD on call overnight was notified regarding patient's elevated temperature. No new orders were received. PRN Tylenol given. Will continue to monitor patient.

## 2017-07-24 NOTE — Progress Notes (Signed)
ANTICOAGULATION CONSULT NOTE - Initial Consult  Pharmacy Consult for IV heparin Indication: possible DVT (LLE redness and edema)  Allergies  Allergen Reactions  . Penicillins Hives    Childhood allergy could have been more reaction only remembers hives.  07/23/17: Tolerated Zosyn    Patient Measurements: Height: 5\' 7"  (170.2 cm) Weight: 222 lb 0.1 oz (100.7 kg) IBW/kg (Calculated) : 61.6 Heparin Dosing Weight: 84 kg  Vital Signs: Temp: 100.7 F (38.2 C) (08/03 1650) Temp Source: Oral (08/03 1547) BP: 146/80 (08/03 1547) Pulse Rate: 124 (08/03 1547)  Labs:  Recent Labs  07/23/17 1048 07/23/17 1049 07/23/17 2225 07/24/17 0125  HGB 11.2* 11.1*  --  10.3*  HCT 33.0* 33.9*  --  31.1*  PLT  --  272  --  247  LABPROT  --   --  14.3  --   INR  --   --  1.11  --   CREATININE 1.10* 1.17*  --  1.07*    Estimated Creatinine Clearance: 61.3 mL/min (A) (by C-G formula based on SCr of 1.07 mg/dL (H)).   Medical History: Past Medical History:  Diagnosis Date  . Anemia   . Anxiety   . Bacteremia   . Bursitis    "left hip; sometimes left shoulder" (03/05/2015)  . Chest pain    Troponins up to 0.12 while hospitalized for E.coli PNA  . Depression   . Family history of adverse reaction to anesthesia    "brother died 06/24/2013; woke up w/headache, vomited blood; drilled holes in his head; bleeding stroke; never came back" (03/05/2015)  . Headache    "when someone gets on my nerves" (03/05/2015)  . High cholesterol   . Hypertension   . Narcolepsy   . 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 Stroke [facility](HCC) 10/2014   "weaker on left side since" (03/05/2015)  . Type II diabetes mellitus (HCC)   . Walking pneumonia 04/2009   "double" /notes 04/23/2011     Assessment: 62 y/oF [facility] with sepsis, known to pharmacy for antibiotic consults. Patient noted to have LLE redness and edema, and pharmacy consulted to start heparin infusion for possible DVT.  Patient on Clopidogrel and prophylactic dose Enoxaparin. CBC reveals Hgb of 10.3, Pltc WNL. Baseline PT/INR 14.3/1.11.   Goal of Therapy:  Heparin level 0.3-0.7 units/ml Monitor platelets by anticoagulation protocol: Yes   Plan:   D/C Enoxaparin 40mg  SQ q24h (last dose given 07/23/2017 at 2309).  Heparin 2500 units IV bolus x 1, then start heparin infusion at 1400 units/hr.  Heparin level 6 hours after infusion initiated.  Daily CBC and heparin level.  Monitor closely for s/sx of bleeding.   Greer PickerelJigna Leoncio Hansen, PharmD, BCPS Pager: [phone] 07/24/2017 8:36 PM

## 2017-07-24 NOTE — Clinical Social Work Note (Signed)
Clinical Social Work Assessment  Patient Details  Name: Laurie [name] MRN: [id number] Date of Birth: Jan 11, 1946  Date of referral:  07/24/17               Reason for consult:  Facility Placement, Discharge Planning                Permission sought to share information with:  Facility Art therapist granted to share information::  Yes, Verbal Permission Granted  Name::        Agency::     Relationship::     Contact Information:     Housing/Transportation Living arrangements for the past 2 months:  [place] of Information:  Patient, Adult Children Patient Interpreter Needed:  None Criminal Activity/Legal Involvement Pertinent to Current Situation/Hospitalization:  No - Comment as needed Significant Relationships:  Adult Children Lives with:  Facility Resident Do you feel safe going back to the place where you live?  Yes Need for family participation in patient care:  Yes (Comment)  Care giving concerns:  Daughter lives in [place] and would like pt to transfer to facility closer to her in [place]. Out of state SNF placement process reviewed with pt's daughter. She will begin looking for facilities in her state.   Social Worker assessment / plan:  Pt admitted from [facility] on 07/23/17 with sepsis. CSW met with pt this am to assist with dc planning. Pt confirms that she is a LTC resident from [place] and will return at dc. Pt gave CSW permission to contact her daughter [name] [phone]. Daughter also confirms plan for pt to return to SNF at dc. Clinicals have been sent to [facility] for review. CSW will continue to follow to assist with dc planning needs.  Employment status:  Retired Forensic scientist:  Medicare PT Recommendations:  Not assessed at this time [place] / Referral to community resources:  [place]  Patient/Family's Response to care:  Pt / daughter plan for pt to return to SNF at  [facility].  Patient/Family's Understanding of and Emotional Response to Diagnosis, Current Treatment, and Prognosis:  Daughter is aware of pt's medical status. Pt reports that she is feeling poorly. Support provided to pt / daughter.  Emotional Assessment Appearance:  Appears stated age Attitude/Demeanor/Rapport:  Other (cooperative) Affect (typically observed):  Quiet, Calm Orientation:  Oriented to Self, Oriented to Place, Oriented to  Time, Oriented to Situation Alcohol / Substance use:  Not Applicable Psych involvement (Current and /or in the community):  No (Comment)  Discharge Needs  Concerns to be addressed:  Discharge Planning Concerns Readmission within the last 30 days:  No Current discharge risk:  None Barriers to Discharge:  No Barriers Identified   Laurie [name], [place] 07/24/2017, 9:59 AM

## 2017-07-24 NOTE — Progress Notes (Signed)
PROGRESS NOTE    JENINE KRISHER  ZOX:[id number] DOB: [birth year] DOA: 07/23/2017 PCP: Rometta Emery, MD   Brief Narrative:   71 year old female with past medical history of chronic diastolic heart failure, diabetes, status post right below-knee amputation and peripheral vascular disease came to the ER with complaints of generalized body aches. In the ER she was noted to be septic with elevated lactic acid without any obvious source of infection. She was started on broad-spectrum antibiotics and admitted for further management.  Assessment & Plan:   Principal Problem:   Sepsis (HCC) Active Problems:   Diabetes mellitus type 2 with peripheral artery disease (HCC)   Anxiety state   Essential hypertension   Chronic diastolic heart failure, grade 2   Dyslipidemia   Diabetic foot ulcer (HCC)   Obesity (BMI 30-39.9)   Sepsis Presumed Cellulitis versus pneumonia versus infected diabetic wound and lower extremity -Unknown source of exact etiology. Cultures are drawn and will follow-up -UA is negative, chest x-ray shows mild cardiomegaly with some vascular congestion without any signs of pneumonia. ProBNP is 24.5. Pro-calcitonin 2.36 -Tylenol when necessary. Broad-spectrum antibiotics vancomycin and Zosyn -Order lower extremity Dopplers to rule out any DVT. -Continue IV fluids. Monitor urine output -I don't see any swelling of the knee area at this time that raises suspicion for septic arthritis but will closely monitor.  Diarrhea -Unspecified. No more episodes since she's been in the hospital. If she has any further episodes we will order stool studies  Diabetic foot ulcer -Local dressing. Wound care has been consulted. Lower extremity x-ray is negative for any obvious signs of osteomyelitis.  1.4 cm liver lesion- metastatic disease versus atypical hemangioma versus small hepatic abscess -Patient has never had colonoscopy. She needs to follow this up outpatient. She will need  colonoscopy once her acute sickness has been taken care of. -We'll obtain MRI of the abdomen if necessary  Diabetes type 2 -Accu-Cheks and sliding scale. Hemoglobin A1c has been sent. -Lantus 40 units.  Peripheral neuropathy -Secondary to diabetes -Continue gabapentin  Peripheral vascular disease -Continue statin and Plavix  Essential hypertension -On clonidine 0.2 mg 3 times daily, lisinopril 20 mg daily -Hold off on torsemide 100 mg daily.  Hyperlipidemia -On statin  DVT prophylaxis: Lovenox Code Status: Full code Family Communication:  None at bedside but I spoke with the patient's daughter Shanda Bumps over the phone.  Disposition Plan: To be determined  Consultants:   None  Procedures:   None  Antimicrobials:   Vanc  Zosyn   Subjective: No complaints per patient this morning. She states she feels ok but have fevers overnight for which she was given tylenol.   Objective: Vitals:   07/23/17 1334 07/23/17 1505 07/23/17 2106 07/24/17 0529  BP: 107/61 108/73 (!) 151/70 (!) 154/49  Pulse: (!) 115 (!) 117 (!) 113 (!) 118  Resp: 18 17 20 20   Temp: (!) 100.6 F (38.1 C) 100.1 F (37.8 C) (!) 100.7 F (38.2 C) (!) 102.2 F (39 C)  TempSrc: Oral Axillary Oral Oral  SpO2: 94% 98% 96% 100%  Weight:  100.7 kg (222 lb 0.1 oz)    Height:  5\' 7"  (1.702 m)      Intake/Output Summary (Last 24 hours) at 07/24/17 1024 Last data filed at 07/24/17 0700  Gross per 24 hour  Intake             5315 ml  Output              500  ml  Net             4815 ml   Filed Weights   07/23/17 1505  Weight: 100.7 kg (222 lb 0.1 oz)    Examination:  General exam: Appears calm and comfortable  Respiratory system: Clear to auscultation. Respiratory effort normal. Cardiovascular system: S1 & S2 heard, RRR. No JVD, murmurs, rubs, gallops or clicks. No pedal edema. Gastrointestinal system: Abdomen is nondistended, soft and nontender. No organomegaly or masses felt. Normal bowel sounds  heard. Central nervous system: Alert and oriented. No focal neurological deficits. Extremities: Symmetric 5 x 5 power. Skin: No rashes, lesions or ulcers Psychiatry: Judgement and insight appear normal. Mood & affect appropriate.     Data Reviewed:   CBC:  Recent Labs Lab 07/23/17 1048 07/23/17 1049 07/24/17 0125  WBC  --  10.3 18.8*  NEUTROABS  --  9.4*  --   HGB 11.2* 11.1* 10.3*  HCT 33.0* 33.9* 31.1*  MCV  --  91.9 91.7  PLT  --  272 247   Basic Metabolic Panel:  Recent Labs Lab 07/23/17 1048 07/23/17 1049 07/24/17 0125  NA 141 139 141  K 3.4* 3.4* 3.6  CL 104 104 110  CO2  --  25 23  GLUCOSE 248* 244* 186*  BUN 28* 28* 20  CREATININE 1.10* 1.17* 1.07*  CALCIUM  --  8.6* 7.8*   GFR: Estimated Creatinine Clearance: 61.3 mL/min (A) (by C-G formula based on SCr of 1.07 mg/dL (H)). Liver Function Tests:  Recent Labs Lab 07/23/17 1049  AST 22  ALT 15  ALKPHOS 92  BILITOT 0.4  PROT 7.0  ALBUMIN 3.3*   No results for input(s): LIPASE, AMYLASE in the last 168 hours. No results for input(s): AMMONIA in the last 168 hours. Coagulation Profile:  Recent Labs Lab 07/23/17 2225  INR 1.11   Cardiac Enzymes: No results for input(s): CKTOTAL, CKMB, CKMBINDEX, TROPONINI in the last 168 hours. BNP (last 3 results) No results for input(s): PROBNP in the last 8760 hours. HbA1C: No results for input(s): HGBA1C in the last 72 hours. CBG:  Recent Labs Lab 07/23/17 1810 07/23/17 2112 07/24/17 0758  GLUCAP 171* 152* 170*   Lipid Profile: No results for input(s): CHOL, HDL, LDLCALC, TRIG, CHOLHDL, LDLDIRECT in the last 72 hours. Thyroid Function Tests: No results for input(s): TSH, T4TOTAL, FREET4, T3FREE, THYROIDAB in the last 72 hours. Anemia Panel: No results for input(s): VITAMINB12, FOLATE, FERRITIN, TIBC, IRON, RETICCTPCT in the last 72 hours. Sepsis Labs:  Recent Labs Lab 07/23/17 1048 07/23/17 1049 07/23/17 2225 07/24/17 0125  PROCALCITON   --  2.36  --   --   LATICACIDVEN 2.17*  --  2.1* 2.1*    No results found for this or any previous visit (from the past 240 hour(s)).       Radiology Studies: Dg Chest 2 View  Result Date: 07/23/2017 CLINICAL DATA:  Sepsis EXAM: CHEST  2 VIEW COMPARISON:  08/30/2009 FINDINGS: Mild cardiomegaly and vascular congestion. No confluent opacities or effusions. No acute bony abnormality. IMPRESSION: Mild cardiomegaly and vascular congestion. Electronically Signed   By: Charlett NoseKevin  Dover M.D.   On: 07/23/2017 11:08   Dg Ankle Complete Left  Result Date: 07/23/2017 CLINICAL DATA:  Left ankle pain. EXAM: LEFT ANKLE COMPLETE - 3+ VIEW COMPARISON:  Left ankle MRI dated December 04, 2016. FINDINGS: There is no evidence of fracture, dislocation, or joint effusion. The talar dome and ankle mortise are intact. Mild tibiotalar note degenerative changes.  Small Achilles and plantar enthesophytes. Surgical clips noted in the lower leg. Mild benign-appearing periosteal reaction along the fibula, nonspecific, but possibly due to venous insufficiency. Small soft tissue defect over the heel. IMPRESSION: 1. Small soft tissue defect over the heel. No radiographic evidence of osteomyelitis. Electronically Signed   By: Obie Dredge M.D.   On: 07/23/2017 12:57   Ct Abdomen Pelvis W Contrast  Result Date: 07/23/2017 CLINICAL DATA:  71 year old female with multiple medical problems, fever and whole-body aching. EXAM: CT ABDOMEN AND PELVIS WITH CONTRAST TECHNIQUE: Multidetector CT imaging of the abdomen and pelvis was performed using the standard protocol following bolus administration of intravenous contrast. CONTRAST:  30mL ISOVUE-300 IOPAMIDOL (ISOVUE-300) INJECTION 61%, 80mL ISOVUE-300 IOPAMIDOL (ISOVUE-300) INJECTION 61% COMPARISON:  Most recent prior CT scan of the abdomen and pelvis 05/06/2009 FINDINGS: Lower chest: Patchy airspace opacity in a peribronchovascular distribution in the left lower lobe concerning for a  combination of infiltrate and atelectasis. Minimal dependent atelectasis is present in the right lower lobe. Mild lower lobe bronchial wall thickening. No pleural effusion. Visualized cardiac structures are normal in size. No pericardial effusion. Unremarkable distal thoracic esophagus. Hepatobiliary: Ill-defined 1.4 cm low-attenuation subcapsular lesion in the anterior aspect of hepatic segment 2 which was not visible on prior studies. Otherwise, no discrete hepatic lesion. The gallbladder is small and contracted. High attenuation stones are present in the gallbladder neck. No intra or extrahepatic biliary ductal dilatation. Pancreas: Unremarkable. No pancreatic ductal dilatation or surrounding inflammatory changes. Spleen: Normal in size without focal abnormality. Adrenals/Urinary Tract: Unremarkable adrenal glands. No enhancing renal lesion, hydronephrosis or nephrolithiasis. 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 No significant interval change other than slightly increased peripheral calcification compared to 2010. Unremarkable ureters and bladder. 1.3 cm low-attenuation lesion in the posterior aspect of the upper pole of the left kidney remains unchanged dating back to 2010 consistent with a benign cyst. Stomach/Bowel: There are at least 4 focal areas of concentric narrowing involving the sigmoid and distal descending colon. This almost certainly represents benign peristalsis given the sequential nature of the findings. Otherwise, no focal bowel wall thickening or evidence obstruction. Unremarkable terminal ileum. Normal appendix. Vascular/Lymphatic: Atherosclerotic calcifications throughout the abdominal aorta. No evidence of aneurysm. Suspect a high-grade stenosis of the left common iliac artery in the region of the iliac bifurcation. No suspicious lymphadenopathy. Reproductive:  Globular uterus with internal calcifications likely representing old degenerated fibroids. No adnexal mass. Other: No abdominal wall hernia or abnormality. No abdominopelvic ascites. Musculoskeletal: No acute fracture or aggressive appearing lytic or blastic osseous lesion. IMPRESSION: 1. Left lower lobe patchy airspace opacity in a peribronchovascular distribution concerning for bronchopneumonia. 2. Ill-defined 1.4 cm low-attenuation subcapsular lesion in the anterior aspect of hepatic segment 2 was not present on prior imaging from 2010. Differential considerations include metastatic disease from an occult primary, interval enlargement of an atypical hemangioma, and potentially a very small hepatic abscess although the appearance is not classic. Recommend further evaluation with non emergent MRI of the abdomen with gadolinium contrast when the patient's acute illness has some stabilized. 3. Cholelithiasis. 4. 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 5.  Aortic Atherosclerosis (ICD10-170.0) 6. 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 7. Fibroid uterus. Electronically Signed   By: Malachy Moan M.D.   On: 07/23/2017 20:46  Scheduled Meds: . atorvastatin  20 mg Oral q1800  . clopidogrel  75 mg Oral Daily  . DULoxetine  60 mg Oral Daily  . enoxaparin (LOVENOX) injection  40 mg Subcutaneous Q24H  . insulin aspart  0-20 Units Subcutaneous TID WC  . insulin aspart  0-5 Units Subcutaneous QHS  . insulin detemir  40 Units Subcutaneous QHS  . lamoTRIgine  100 mg Oral QHS  . oxybutynin  5 mg Oral QHS   Continuous Infusions: . sodium chloride 100 mL/hr at 07/24/17 0221  . piperacillin-tazobactam (ZOSYN)  IV Stopped (07/24/17 0945)  . vancomycin 1,250 mg (07/24/17 1022)     LOS: 1 day    Time spent: 35 mins     Pahoua Schreiner Joline Maxcyhirag Jeani Fassnacht, MD [facility] Pager [phone]   If 7PM-7AM, please contact  night-coverage [email or web address] Password [email or web address] 07/24/2017, 10:24 AM

## 2017-07-25 ENCOUNTER — Inpatient Hospital Stay (HOSPITAL_COMMUNITY): Payer: Medicare Other

## 2017-07-25 DIAGNOSIS — L0390 Cellulitis, unspecified: Secondary | ICD-10-CM

## 2017-07-25 DIAGNOSIS — F411 Generalized anxiety disorder: Secondary | ICD-10-CM

## 2017-07-25 LAB — GLUCOSE, CAPILLARY
GLUCOSE-CAPILLARY: 149 mg/dL — AB (ref 65–99)
Glucose-Capillary: 150 mg/dL — ABNORMAL HIGH (ref 65–99)
Glucose-Capillary: 105 mg/dL — ABNORMAL HIGH (ref 65–99)
Glucose-Capillary: 159 mg/dL — ABNORMAL HIGH (ref 65–99)

## 2017-07-25 LAB — CBC
HCT: 31.4 % — ABNORMAL LOW (ref 36.0–46.0)
Hemoglobin: 10.4 g/dL — ABNORMAL LOW (ref 12.0–15.0)
MCH: 30.4 pg (ref 26.0–34.0)
MCHC: 33.1 g/dL (ref 30.0–36.0)
MCV: 91.8 fL (ref 78.0–100.0)
PLATELETS: 243 10*3/uL (ref 150–400)
RBC: 3.42 MIL/uL — AB (ref 3.87–5.11)
RDW: 14.5 % (ref 11.5–15.5)
WBC: 19.6 10*3/uL — AB (ref 4.0–10.5)

## 2017-07-25 LAB — COMPREHENSIVE METABOLIC PANEL
ALBUMIN: 2.3 g/dL — AB (ref 3.5–5.0)
ALK PHOS: 76 U/L (ref 38–126)
ALT: 20 U/L (ref 14–54)
ANION GAP: 9 (ref 5–15)
AST: 26 U/L (ref 15–41)
BUN: 14 mg/dL (ref 6–20)
CHLORIDE: 110 mmol/L (ref 101–111)
CO2: 21 mmol/L — AB (ref 22–32)
Calcium: 7.5 mg/dL — ABNORMAL LOW (ref 8.9–10.3)
Creatinine, Ser: 1.01 mg/dL — ABNORMAL HIGH (ref 0.44–1.00)
GFR calc Af Amer: >60 mL/min (ref >60)
GFR calc non Af Amer: 56 mL/min — ABNORMAL LOW (ref >60)
GLUCOSE: 163 mg/dL — AB (ref 65–99)
POTASSIUM: 3 mmol/L — AB (ref 3.5–5.1)
SODIUM: 140 mmol/L (ref 135–145)
Total Bilirubin: 0.5 mg/dL (ref 0.3–1.2)
Total Protein: 5.9 g/dL — ABNORMAL LOW (ref 6.5–8.1)

## 2017-07-25 LAB — HEMOGLOBIN A1C
Hgb A1c MFr Bld: 11.5 % — ABNORMAL HIGH (ref 4.8–5.6)
MEAN PLASMA GLUCOSE: 283 mg/dL

## 2017-07-25 LAB — MRSA PCR SCREENING
MRSA by PCR: INVALID — AB
MRSA by PCR: INVALID — AB

## 2017-07-25 LAB — HEPARIN LEVEL (UNFRACTIONATED)
Heparin Unfractionated: 0.19 [IU]/mL — ABNORMAL LOW (ref 0.30–0.70)
Heparin Unfractionated: 0.3 IU/mL (ref 0.30–0.70)

## 2017-07-25 LAB — MAGNESIUM: Magnesium: 1.4 mg/dL — ABNORMAL LOW (ref 1.7–2.4)

## 2017-07-25 LAB — PROCALCITONIN: Procalcitonin: 16.58 ng/mL

## 2017-07-25 MED ORDER — MAGNESIUM OXIDE 400 (241.3 MG) MG PO TABS
400.0000 mg | ORAL_TABLET | Freq: Two times a day (BID) | ORAL | Status: AC
  Administered 2017-07-25 (×2): 400 mg via ORAL
  Filled 2017-07-25 (×2): qty 1

## 2017-07-25 MED ORDER — LISINOPRIL 20 MG PO TABS
20.0000 mg | ORAL_TABLET | Freq: Every day | ORAL | Status: DC
  Administered 2017-07-25 – 2017-08-02 (×9): 20 mg via ORAL
  Filled 2017-07-25 (×9): qty 1

## 2017-07-25 MED ORDER — OXYCODONE-ACETAMINOPHEN 5-325 MG PO TABS
1.0000 | ORAL_TABLET | ORAL | Status: DC | PRN
  Administered 2017-07-25: 1 via ORAL
  Filled 2017-07-25: qty 1

## 2017-07-25 MED ORDER — OXYCODONE-ACETAMINOPHEN 5-325 MG PO TABS
1.0000 | ORAL_TABLET | ORAL | Status: DC | PRN
  Administered 2017-07-25 – 2017-07-29 (×6): 1 via ORAL
  Filled 2017-07-25 (×6): qty 1

## 2017-07-25 MED ORDER — GABAPENTIN 300 MG PO CAPS
300.0000 mg | ORAL_CAPSULE | Freq: Two times a day (BID) | ORAL | Status: DC
  Administered 2017-07-25 – 2017-07-26 (×4): 300 mg via ORAL
  Filled 2017-07-25 (×3): qty 1

## 2017-07-25 MED ORDER — SENNOSIDES-DOCUSATE SODIUM 8.6-50 MG PO TABS: 1.0000 | ORAL_TABLET | Freq: Every evening | ORAL | Status: DC | PRN

## 2017-07-25 MED ORDER — POTASSIUM CHLORIDE CRYS ER 20 MEQ PO TBCR
40.0000 meq | EXTENDED_RELEASE_TABLET | Freq: Two times a day (BID) | ORAL | Status: AC
  Administered 2017-07-25 (×2): 40 meq via ORAL
  Filled 2017-07-25 (×2): qty 2

## 2017-07-25 MED ORDER — CLONIDINE HCL 0.2 MG PO TABS
0.2000 mg | ORAL_TABLET | Freq: Three times a day (TID) | ORAL | Status: DC
  Administered 2017-07-25 – 2017-08-02 (×24): 0.2 mg via ORAL
  Filled 2017-07-25 (×24): qty 1

## 2017-07-25 MED ORDER — HEPARIN (PORCINE) IN NACL 100-0.45 UNIT/ML-% IJ SOLN
1650.0000 [IU]/h | INTRAMUSCULAR | Status: DC
  Administered 2017-07-25 – 2017-07-26 (×2): 1650 [IU]/h via INTRAVENOUS
  Filled 2017-07-25: qty 250

## 2017-07-25 MED ORDER — HYDROCODONE-ACETAMINOPHEN 5-325 MG PO TABS
1.0000 | ORAL_TABLET | ORAL | Status: DC | PRN
  Administered 2017-07-25: 1 via ORAL
  Administered 2017-07-26 – 2017-08-01 (×12): 2 via ORAL
  Administered 2017-08-02: 1 via ORAL
  Filled 2017-07-25: qty 1
  Filled 2017-07-25 (×7): qty 2
  Filled 2017-07-25: qty 1
  Filled 2017-07-25 (×5): qty 2
  Filled 2017-07-25: qty 1
  Filled 2017-07-25: qty 2

## 2017-07-25 NOTE — Progress Notes (Addendum)
*  Preliminary Results* Bilateral lower extremity venous duplex completed. There is no obvious evidence of acute deep vein thrombosis involving bilateral lower extremities. There is no evidence of Baker's cyst bilaterally.  Incidental finding: an arterial bypass graft was visualized in the proximal left calf and was found to be patent.  07/25/2017 9:23 AM Gertie FeyMichelle Swain Ac[place], BS, RVT, RDCS, RDMS

## 2017-07-25 NOTE — Progress Notes (Signed)
ANTICOAGULATION CONSULT NOTE - Follow-up  Pharmacy Consult for IV heparin Indication: possible DVT (LLE redness and edema)  Allergies  Allergen Reactions  . Penicillins Hives    Childhood allergy could have been more reaction only remembers hives.  07/23/17: Tolerated Zosyn    Patient Measurements: Height: 5\' 7"  (170.2 cm) Weight: 222 lb 0.1 oz (100.7 kg) IBW/kg (Calculated) : 61.6 Heparin Dosing Weight: 84 kg  Vital Signs: Temp: 99.1 F (37.3 C) (08/04 0651) Temp Source: Oral (08/04 0651) BP: 169/72 (08/04 0651) Pulse Rate: 107 (08/04 0651)  Labs:  Recent Labs  07/23/17 1049 07/23/17 2225 07/24/17 0125 07/25/17 0515 07/25/17 1322  HGB 11.1*  --  10.3* 10.4*  --   HCT 33.9*  --  31.1* 31.4*  --   PLT 272  --  247 243  --   LABPROT  --  14.3  --   --   --   INR  --  1.11  --   --   --   HEPARINUNFRC  --   --   --  0.19* 0.30  CREATININE 1.17*  --  1.07* 1.01*  --     Estimated Creatinine Clearance: 65 mL/min (A) (by C-G formula based on SCr of 1.01 mg/dL (H)).   Medical History: Past Medical History:  Diagnosis Date  . Anemia   . Anxiety   . Bacteremia   . Bursitis    "left hip; sometimes left shoulder" (03/05/2015)  . Chest pain    Troponins up to 0.12 while hospitalized for E.coli PNA  . Depression   . Family history of adverse reaction to anesthesia    "brother died 06/24/2013; woke up w/headache, vomited blood; drilled holes in his head; bleeding stroke; never came back" (03/05/2015)  . Headache    "when someone gets on my nerves" (03/05/2015)  . High cholesterol   . Hypertension   . Narcolepsy   . 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 Stroke [facility](HCC) 10/2014   "weaker on left side since" (03/05/2015)  . Type II diabetes mellitus (HCC)   . Walking pneumonia 04/2009   "double" /notes 04/23/2011     Assessment: 62 y/oF [facility] with sepsis, known to pharmacy for antibiotic consults. Patient noted to have LLE redness  and edema, and pharmacy consulted to start heparin infusion for possible DVT. Patient on Clopidogrel and prophylactic dose Enoxaparin. CBC reveals Hgb of 10.3, Pltc WNL. Baseline PT/INR 14.3/1.11.   Today, 07/25/2017  Heparin level at low-end of therapeutic range  CBC: hgb low stable, pltc WNL  LE dopplers negative for DVT per rad tech but MD wishes to continue until report finalized by physician  Goal of Therapy:  Heparin level 0.3-0.7 units/ml Monitor platelets by anticoagulation protocol: Yes   Plan:   Heparin at low end therapeutic range, increase heparin to 1650 units/hr  OK to stop heparin gtt if final doppler report is negative for DVT  Heparin level 6 hours after rate increase  Daily CBC and heparin level.  Monitor closely for s/sx of bleeding.  Juliette Alcideustin Delrick Dehart, PharmD, BCPS.   Pager: [phone] 07/25/2017 2:55 PM

## 2017-07-25 NOTE — Progress Notes (Signed)
Labs called. MRSA PCR swab [facility].

## 2017-07-25 NOTE — Progress Notes (Signed)
ANTICOAGULATION CONSULT NOTE - Follow Up Consult  Pharmacy Consult for Heparin Indication: possible DVT (LLE redness and edema)  Allergies  Allergen Reactions  . Penicillins Hives    Childhood allergy could have been more reaction only remembers hives.  07/23/17: Tolerated Zosyn    Patient Measurements: Height: 5\' 7"  (170.2 cm) Weight: 222 lb 0.1 oz (100.7 kg) IBW/kg (Calculated) : 61.6 Heparin Dosing Weight:   Vital Signs: Temp: 100.6 F (38.1 C) (08/04 0207) Temp Source: Oral (08/04 0207) BP: 178/78 (08/04 0207) Pulse Rate: 117 (08/04 0207)  Labs:  Recent Labs  07/23/17 1049 07/23/17 2225 07/24/17 0125 07/25/17 0515  HGB 11.1*  --  10.3* 10.4*  HCT 33.9*  --  31.1* 31.4*  PLT 272  --  247 243  LABPROT  --  14.3  --   --   INR  --  1.11  --   --   HEPARINUNFRC  --   --   --  0.19*  CREATININE 1.17*  --  1.07* 1.01*    Estimated Creatinine Clearance: 65 mL/min (A) (by C-G formula based on SCr of 1.01 mg/dL (H)).   Medications:  Infusions:  . sodium chloride 100 mL/hr at 07/24/17 2123  . heparin 1,400 Units/hr (07/24/17 2134)  . piperacillin-tazobactam (ZOSYN)  IV 3.375 g (07/25/17 0321)  . vancomycin Stopped (07/24/17 1143)    Assessment:  Patient with low heparin level.  No heparin issues per RN.    Goal of Therapy:  Heparin level 0.3-0.7 units/ml Monitor platelets by anticoagulation protocol: Yes   Plan:  Increase heparin to 1550 units/hr Recheck level at [address]  Yolando Gillum Jr, [place]ian [place] 07/25/2017,6:18 AM

## 2017-07-25 NOTE — Progress Notes (Signed)
MD at bedside. Verbal orders for blood cultures x2 if pt spikes another fever, or noted with increased redness to left hip. Pt afebrile during MD assessment. 1657- pt noted with temp 100.4. Ordered blood cultures x2 per MD orders. MD called back with  Verbal order to have PICC inserted 07/26/17 if IV access loss.

## 2017-07-25 NOTE — Progress Notes (Signed)
MD updated lower extremity venous duplex results.

## 2017-07-25 NOTE — Progress Notes (Signed)
PROGRESS NOTE    Laurie [name]  ZOX:[id number] DOB: [birth year] DOA: 07/23/2017 PCP: Rometta Emery, MD   Brief Narrative:   71 year old female with past medical history of chronic diastolic heart failure, diabetes, status post right below-knee amputation and peripheral vascular disease came to the ER with complaints of generalized body aches. In the ER she was noted to be septic with elevated lactic acid without any obvious source of infection. She was started on broad-spectrum antibiotics and admitted for further management.  Assessment & Plan:   Principal Problem:   Sepsis (HCC) Active Problems:   Diabetes mellitus type 2 with peripheral artery disease (HCC)   Anxiety state   Essential hypertension   Chronic diastolic heart failure, grade 2   Dyslipidemia   Diabetic foot ulcer (HCC)   Obesity (BMI 30-39.9)   Sepsis Presumed Cellulitis versus pneumonia versus infected diabetic wound -Unknown source of exact etiology. Cultures are drawn - no growth to date. Plan on repeating blood cultures if she continues to spike temperature. -UA is negative, chest x-ray shows mild cardiomegaly with some vascular congestion without any signs of pneumonia. ProBNP is 24.5. Pro-calcitonin 2.36 -> 16.58 -Tylenol when necessary. Broad-spectrum antibiotics vancomycin and Zosyn -LE Doppers is neg for DVT (Prelim) - on heparin drip. Will discontinue it once final read is negative. Pain control  -Continue IV fluids. Monitor urine output -I don't see any swelling of the knee area at this time that raises suspicion for septic arthritis but will closely monitor.  Left hip pain; chronic -She states this is chronic and from her bursitis. If this persists we'll get a CT -Restart pain medications  Diarrhea -Unspecified. No more episodes since she's been in the hospital. If she has any further episodes we will order stool studies  Diabetic foot ulcer -X-ray the foot is negative for any signs of  osteomyelitis. Wound care team has seen the patient-appreciate recommendations.  1.4 cm liver lesion- metastatic disease versus atypical hemangioma versus small hepatic abscess -Patient has never had colonoscopy. She needs to follow this up outpatient. She will need colonoscopy once her acute sickness has been taken care of. -We'll obtain MRI of the abdomen if necessary  Diabetes type 2 -Accu-Cheks and sliding scale. Hemoglobin A1c 11.5 -Lantus 40 units.  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 According to previous vascular note from December 2017 determined patient is a poor candidate for revascularization.  Essential hypertension -On clonidine 0.2 mg 3 times daily, lisinopril 20 mg daily- started today -Hold off on torsemide 100 mg daily.  Hyperlipidemia -On statin  DVT prophylaxis: Lovenox Code Status: Full code Family Communication: None at bedside  Disposition Plan: To be determined  Consultants:   None  Procedures:   None  Antimicrobials:   Vanc  Zosyn   Subjective: Patient is tearful as she is having left hip pain from the pressure which she states is chronic due to her bursitis. Her left lower extremity appears little less swollen today. Low-grade temperature overnight.  Objective: Vitals:   07/24/17 1650 07/24/17 2140 07/25/17 0207 07/25/17 0651  BP:  (!) 149/47 (!) 178/78 (!) 169/72  Pulse:  (!) 117 (!) 117 (!) 107  Resp:  20 (!) 24 (!) 24  Temp: (!) 100.7 F (38.2 C) 99.7 F (37.6 C) (!) 100.6 F (38.1 C) 99.1 F (37.3 C)  TempSrc:  Oral Oral Oral  SpO2:  95% 93% 92%  Weight:      Height:  Intake/Output Summary (Last 24 hours) at 07/25/17 1054 Last data filed at 07/25/17 1038  Gross per 24 hour  Intake          3653.07 ml  Output             2900 ml  Net           753.07 ml   Filed Weights   07/23/17 1505  Weight: 100.7 kg (222 lb 0.1 oz)     Examination:  General exam: Appears calm and comfortable  Respiratory system: Clear to auscultation. Respiratory effort normal. Cardiovascular system: S1 & S2 heard, RRR. No JVD, murmurs, rubs, gallops or clicks. No pedal edema. Gastrointestinal system: Abdomen is nondistended, soft and nontender. No organomegaly or masses felt. Normal bowel sounds heard. Central nervous system: Alert and oriented. No focal neurological deficits. Extremities: Left lower extremity swelling and erythema noted which appears slightly improved from yesterday. Right-sided BKA noted.  Skin: Left lower extremity erythema and swelling with some warmth but no obvious fluctuation Psychiatry: Judgement and insight appear normal. Mood & affect appropriate.     Data Reviewed:   CBC:  Recent Labs Lab 07/23/17 1048 07/23/17 1049 07/24/17 0125 07/25/17 0515  WBC  --  10.3 18.8* 19.6*  NEUTROABS  --  9.4*  --   --   HGB 11.2* 11.1* 10.3* 10.4*  HCT 33.0* 33.9* 31.1* 31.4*  MCV  --  91.9 91.7 91.8  PLT  --  272 247 243   Basic Metabolic Panel:  Recent Labs Lab 07/23/17 1048 07/23/17 1049 07/24/17 0125 07/25/17 0515  NA 141 139 141 140  K 3.4* 3.4* 3.6 3.0*  CL 104 104 110 110  CO2  --  25 23 21*  GLUCOSE 248* 244* 186* 163*  BUN 28* 28* 20 14  CREATININE 1.10* 1.17* 1.07* 1.01*  CALCIUM  --  8.6* 7.8* 7.5*  MG  --   --   --  1.4*   GFR: Estimated Creatinine Clearance: 65 mL/min (A) (by C-G formula based on SCr of 1.01 mg/dL (H)). Liver Function Tests:  Recent Labs Lab 07/23/17 1049 07/25/17 0515  AST 22 26  ALT 15 20  ALKPHOS 92 76  BILITOT 0.4 0.5  PROT 7.0 5.9*  ALBUMIN 3.3* 2.3*   No results for input(s): LIPASE, AMYLASE in the last 168 hours. No results for input(s): AMMONIA in the last 168 hours. Coagulation Profile:  Recent Labs Lab 07/23/17 2225  INR 1.11   Cardiac Enzymes: No results for input(s): CKTOTAL, CKMB, CKMBINDEX, TROPONINI in the last 168 hours. BNP (last  3 results) No results for input(s): PROBNP in the last 8760 hours. HbA1C:  Recent Labs  07/23/17 2225  HGBA1C 11.5*   CBG:  Recent Labs Lab 07/24/17 0758 07/24/17 1143 07/24/17 1603 07/24/17 2136 07/25/17 0756  GLUCAP 170* 212* 154* 117* 150*   Lipid Profile: No results for input(s): CHOL, HDL, LDLCALC, TRIG, CHOLHDL, LDLDIRECT in the last 72 hours. Thyroid Function Tests: No results for input(s): TSH, T4TOTAL, FREET4, T3FREE, THYROIDAB in the last 72 hours. Anemia Panel: No results for input(s): VITAMINB12, FOLATE, FERRITIN, TIBC, IRON, RETICCTPCT in the last 72 hours. Sepsis Labs:  Recent Labs Lab 07/23/17 1048 07/23/17 1049 07/23/17 2225 07/24/17 0125 07/25/17 0515  PROCALCITON  --  2.36  --   --  16.58  LATICACIDVEN 2.17*  --  2.1* 2.1*  --     Recent Results (from the past 240 hour(s))  Blood Culture (routine x 2)  Status: None (Preliminary result)   Collection Time: 07/23/17 10:37 AM  Result Value Ref Range Status   Specimen Description BLOOD LEFT ANTECUBITAL  Final   Special Requests   Final    BOTTLES DRAWN AEROBIC AND ANAEROBIC Blood Culture adequate volume   Culture   Final    NO GROWTH < 24 HOURS Performed at [facility], [address] [place], [place] [address]    Report Status PENDING  Incomplete  Urine culture     Status: None   Collection Time: 07/23/17 11:51 AM  Result Value Ref Range Status   Specimen Description URINE, CATHETERIZED  Final   Special Requests NONE  Final   Culture   Final    NO GROWTH Performed at [facility], [address] [place], [place] [address]    Report Status 07/24/2017 FINAL  Final  Blood Culture (routine x 2)     Status: None (Preliminary result)   Collection Time: 07/23/17  4:39 PM  Result Value Ref Range Status   Specimen Description BLOOD BLOOD RIGHT HAND  Final   Special Requests IN PEDIATRIC BOTTLE Blood Culture adequate volume  Final   Culture   Final    NO GROWTH < 24  HOURS Performed at [facility], [address] [place], [place] [address]    Report Status PENDING  Incomplete  MRSA PCR Screening     Status: Abnormal   Collection Time: 07/24/17 11:47 PM  Result Value Ref Range Status   MRSA by PCR INVALID RESULTS, SPECIMEN SENT FOR CULTURE (A) NEGATIVE Final    Comment:        The GeneXpert MRSA Assay (FDA approved for NASAL specimens only), is one component of a comprehensive MRSA colonization surveillance program. It is not intended to diagnose MRSA infection nor to guide or monitor treatment for MRSA infections. RESULT CALLED TO, READ BACK BY AND VERIFIED WITH: D BROWN AT 0017 ON 08.04.2018 BY NBROOKS          Radiology Studies: Dg Chest 2 View  Result Date: 07/23/2017 CLINICAL DATA:  Sepsis EXAM: CHEST  2 VIEW COMPARISON:  08/30/2009 FINDINGS: Mild cardiomegaly and vascular congestion. No confluent opacities or effusions. No acute bony abnormality. IMPRESSION: Mild cardiomegaly and vascular congestion. Electronically Signed   By: Charlett NoseKevin  Dover M.D.   On: 07/23/2017 11:08   Dg Ankle Complete Left  Result Date: 07/23/2017 CLINICAL DATA:  Left ankle pain. EXAM: LEFT ANKLE COMPLETE - 3+ VIEW COMPARISON:  Left ankle MRI dated December 04, 2016. FINDINGS: There is no evidence of fracture, dislocation, or joint effusion. The talar dome and ankle mortise are intact. Mild tibiotalar note degenerative changes. Small Achilles and plantar enthesophytes. Surgical clips noted in the lower leg. Mild benign-appearing periosteal reaction along the fibula, nonspecific, but possibly due to venous insufficiency. Small soft tissue defect over the heel. IMPRESSION: 1. Small soft tissue defect over the heel. No radiographic evidence of osteomyelitis. Electronically Signed   By: Obie DredgeWilliam T Derry M.D.   On: 07/23/2017 12:57   Ct Abdomen Pelvis W Contrast  Result Date: 07/23/2017 CLINICAL DATA:  71 year old female with multiple medical problems, fever and  whole-body aching. EXAM: CT ABDOMEN AND PELVIS WITH CONTRAST TECHNIQUE: Multidetector CT imaging of the abdomen and pelvis was performed using the standard protocol following bolus administration of intravenous contrast. CONTRAST:  30mL ISOVUE-300 IOPAMIDOL (ISOVUE-300) INJECTION 61%, 80mL ISOVUE-300 IOPAMIDOL (ISOVUE-300) INJECTION 61% COMPARISON:  Most recent prior CT scan of the abdomen and  pelvis 05/06/2009 FINDINGS: Lower chest: Patchy airspace opacity in a peribronchovascular distribution in the left lower lobe concerning for a combination of infiltrate and atelectasis. Minimal dependent atelectasis is present in the right lower lobe. Mild lower lobe bronchial wall thickening. No pleural effusion. Visualized cardiac structures are normal in size. No pericardial effusion. Unremarkable distal thoracic esophagus. Hepatobiliary: Ill-defined 1.4 cm low-attenuation subcapsular lesion in the anterior aspect of hepatic segment 2 which was not visible on prior studies. Otherwise, no discrete hepatic lesion. The gallbladder is small and contracted. High attenuation stones are present in the gallbladder neck. No intra or extrahepatic biliary ductal dilatation. Pancreas: Unremarkable. No pancreatic ductal dilatation or surrounding inflammatory changes. Spleen: Normal in size without focal abnormality. Adrenals/Urinary Tract: Unremarkable adrenal glands. No enhancing renal lesion, hydronephrosis or nephrolithiasis. 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 No significant interval change other than slightly increased peripheral calcification compared to 2010. Unremarkable ureters and bladder. 1.3 cm low-attenuation lesion in the posterior aspect of the upper pole of the left kidney remains unchanged dating back to 2010 consistent with a benign cyst. Stomach/Bowel: There are at least 4 focal areas  of concentric narrowing involving the sigmoid and distal descending colon. This almost certainly represents benign peristalsis given the sequential nature of the findings. Otherwise, no focal bowel wall thickening or evidence obstruction. Unremarkable terminal ileum. Normal appendix. Vascular/Lymphatic: Atherosclerotic calcifications throughout the abdominal aorta. No evidence of aneurysm. Suspect a high-grade stenosis of the left common iliac artery in the region of the iliac bifurcation. No suspicious lymphadenopathy. Reproductive: Globular uterus with internal calcifications likely representing old degenerated fibroids. No adnexal mass. Other: No abdominal wall hernia or abnormality. No abdominopelvic ascites. Musculoskeletal: No acute fracture or aggressive appearing lytic or blastic osseous lesion. IMPRESSION: 1. Left lower lobe patchy airspace opacity in a peribronchovascular distribution concerning for bronchopneumonia. 2. Ill-defined 1.4 cm low-attenuation subcapsular lesion in the anterior aspect of hepatic segment 2 was not present on prior imaging from 2010. Differential considerations include metastatic disease from an occult primary, interval enlargement of an atypical hemangioma, and potentially a very small hepatic abscess although the appearance is not classic. Recommend further evaluation with non emergent MRI of the abdomen with gadolinium contrast when the patient's acute illness has some stabilized. 3. Cholelithiasis. 4. 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 5.  Aortic Atherosclerosis (ICD10-170.0) 6. 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 7. Fibroid uterus. Electronically Signed   By: Malachy MoanHeath  McCullough M.D.   On: 07/23/2017 20:46        Scheduled Meds: . atorvastatin  20 mg Oral q1800  . cloNIDine  0.2 mg Oral TID  . clopidogrel  75 mg Oral Daily  . DULoxetine  60 mg Oral Daily  . gabapentin  300 mg Oral BID  .  insulin aspart  0-20 Units Subcutaneous TID WC  . insulin aspart  0-5 Units Subcutaneous QHS  . insulin detemir  40 Units Subcutaneous QHS  . lamoTRIgine  100 mg Oral QHS  . lisinopril  20 mg Oral Daily  . magnesium oxide  400 mg Oral BID  . oxybutynin  5 mg Oral QHS  . potassium chloride  40 mEq Oral BID   Continuous Infusions: . sodium chloride 100 mL/hr at 07/25/17 0834  . heparin 1,550 Units/hr (07/25/17 0620)  . piperacillin-tazobactam (ZOSYN)  IV Stopped (07/25/17  0454)  . vancomycin Stopped (07/24/17 1143)     LOS: 2 days    Time spent: 35 mins     Ankit Joline Maxcy, MD [facility] Pager [phone]   If 7PM-7AM, please contact night-coverage [email or web address] Password [email or web address] 07/25/2017, 10:54 AM

## 2017-07-25 NOTE — Clinical Social Work Note (Signed)
Per MD, pt to possibly discharge on Monday but unsure at this time. CSW will continue to follow for support and discharge needs.   Laurie [name], LCSWA, LCASA Clinical Social Work [facility]([employer] Coverage) [phone]

## 2017-07-26 ENCOUNTER — Inpatient Hospital Stay (HOSPITAL_COMMUNITY): Payer: Medicare Other

## 2017-07-26 DIAGNOSIS — I739 Peripheral vascular disease, unspecified: Secondary | ICD-10-CM

## 2017-07-26 LAB — CBC
HEMATOCRIT: 29.8 % — AB (ref 36.0–46.0)
HEMOGLOBIN: 10.4 g/dL — AB (ref 12.0–15.0)
MCH: 31 pg (ref 26.0–34.0)
MCHC: 34.9 g/dL (ref 30.0–36.0)
MCV: 88.7 fL (ref 78.0–100.0)
Platelets: 224 10*3/uL (ref 150–400)
RBC: 3.36 MIL/uL — ABNORMAL LOW (ref 3.87–5.11)
RDW: 14.6 % (ref 11.5–15.5)
WBC: 13.9 10*3/uL — AB (ref 4.0–10.5)

## 2017-07-26 LAB — GLUCOSE, CAPILLARY
GLUCOSE-CAPILLARY: 124 mg/dL — AB (ref 65–99)
GLUCOSE-CAPILLARY: 98 mg/dL (ref 65–99)
Glucose-Capillary: 105 mg/dL — ABNORMAL HIGH (ref 65–99)
Glucose-Capillary: 116 mg/dL — ABNORMAL HIGH (ref 65–99)
Glucose-Capillary: 130 mg/dL — ABNORMAL HIGH (ref 65–99)

## 2017-07-26 LAB — COMPREHENSIVE METABOLIC PANEL
ALT: 18 U/L (ref 14–54)
AST: 18 U/L (ref 15–41)
Albumin: 2.2 g/dL — ABNORMAL LOW (ref 3.5–5.0)
Alkaline Phosphatase: 73 U/L (ref 38–126)
Anion gap: 10 (ref 5–15)
BUN: 13 mg/dL (ref 6–20)
CHLORIDE: 110 mmol/L (ref 101–111)
CO2: 21 mmol/L — ABNORMAL LOW (ref 22–32)
CREATININE: 0.92 mg/dL (ref 0.44–1.00)
Calcium: 7.6 mg/dL — ABNORMAL LOW (ref 8.9–10.3)
Glucose, Bld: 121 mg/dL — ABNORMAL HIGH (ref 65–99)
POTASSIUM: 3.3 mmol/L — AB (ref 3.5–5.1)
Sodium: 141 mmol/L (ref 135–145)
Total Bilirubin: 0.3 mg/dL (ref 0.3–1.2)
Total Protein: 6 g/dL — ABNORMAL LOW (ref 6.5–8.1)

## 2017-07-26 LAB — HEPARIN LEVEL (UNFRACTIONATED)
HEPARIN UNFRACTIONATED: 0.4 [IU]/mL (ref 0.30–0.70)
Heparin Unfractionated: 0.41 IU/mL (ref 0.30–0.70)

## 2017-07-26 LAB — MAGNESIUM: MAGNESIUM: 1.7 mg/dL (ref 1.7–2.4)

## 2017-07-26 MED ORDER — POTASSIUM CHLORIDE CRYS ER 20 MEQ PO TBCR
40.0000 meq | EXTENDED_RELEASE_TABLET | Freq: Once | ORAL | Status: AC
  Administered 2017-07-26: 40 meq via ORAL
  Filled 2017-07-26: qty 2

## 2017-07-26 MED ORDER — MAGNESIUM OXIDE 400 (241.3 MG) MG PO TABS
400.0000 mg | ORAL_TABLET | Freq: Once | ORAL | Status: AC
  Administered 2017-07-26: 400 mg via ORAL
  Filled 2017-07-26: qty 1

## 2017-07-26 NOTE — Progress Notes (Signed)
ANTICOAGULATION CONSULT NOTE - Follow Up Consult  Pharmacy Consult for Heparin Indication: possible DVT (LLE redness and edema)  Allergies  Allergen Reactions  . Penicillins Hives    Childhood allergy could have been more reaction only remembers hives.  07/23/17: Tolerated Zosyn    Patient Measurements: Height: 5\' 7"  (170.2 cm) Weight: 222 lb 0.1 oz (100.7 kg) IBW/kg (Calculated) : 61.6 Heparin Dosing Weight:   Vital Signs: Temp: 99.6 F (37.6 C) (08/04 2220) Temp Source: Oral (08/04 2220) BP: 149/68 (08/04 2220) Pulse Rate: 100 (08/04 2220)  Labs:  Recent Labs  07/23/17 1049 07/23/17 2225 07/24/17 0125  07/25/17 0515 07/25/17 1322 07/26/17 0018 07/26/17 0510  HGB 11.1*  --  10.3*  --  10.4*  --   --  10.4*  HCT 33.9*  --  31.1*  --  31.4*  --   --  29.8*  PLT 272  --  247  --  243  --   --  224  LABPROT  --  14.3  --   --   --   --   --   --   INR  --  1.11  --   --   --   --   --   --   HEPARINUNFRC  --   --   --   < > 0.19* 0.30 0.40 0.41  CREATININE 1.17*  --  1.07*  --  1.01*  --   --   --   < > = values in this interval not displayed.  Estimated Creatinine Clearance: 65 mL/min (A) (by C-G formula based on SCr of 1.01 mg/dL (H)).   Medications:  Infusions:  . sodium chloride 100 mL/hr at 07/25/17 0834  . heparin 1,650 Units/hr (07/26/17 0500)  . piperacillin-tazobactam (ZOSYN)  IV 3.375 g (07/26/17 0452)  . vancomycin Stopped (07/25/17 1719)    Assessment: Patient with heparin level at goal for 2nd time.  Will follow up plans and recent level with 8/6 AM labs if drip continued.  Goal of Therapy:  Heparin level 0.3-0.7 units/ml Monitor platelets by anticoagulation protocol: Yes   Plan:  Continue heparin drip at current rate Recheck level with AM labs  Darlina GuysGrimsley Jr, Jacquenette ShoneJulian Crowford 07/26/2017,5:54 AM

## 2017-07-26 NOTE — Progress Notes (Signed)
Pharmacy Antibiotic Note  Laurie [name] is a 71 y.o. female admitted on 07/23/2017 with sepsis.  Patient is from nursing home & reports feeling feverish and with diarrhea.  Pharmacy has been consulted for Vancomycin & Zosyn dosing. No known source of sepsis  07/26/2017: Day #4 antibotics  Low grade fever- Tm 100.85F  WBC improving  Renal: SCr improving  MRSA PCR invalid - sent for culture  BCx ordered 8/4  PCT increased 2.36 to 16.58  Allergy to PCN as a child noted, however patient has already received a dose of Zosyn in ED and has not had any adverse reaction per nursing.   Plan:  SCr improved but will check trough in am prior to making dose adjustment  Zosyn 3.375gm IV q8h over 4h infusion   Monitor renal function (daily scr ordered), cx data, and s/sx of allergic reaction  F/u ability to narrow antibiotics  Height: 5\' 7"  (170.2 cm) Weight: 222 lb 0.1 oz (100.7 kg) IBW/kg (Calculated) : 61.6  Temp (24hrs), Avg:99.7 F (37.6 C), Min:99 F (37.2 C), Max:100.4 F (38 C)   Recent Labs Lab 07/23/17 1048 07/23/17 1049 07/23/17 2225 07/24/17 0125 07/25/17 0515 07/26/17 0510  WBC  --  10.3  --  18.8* 19.6* 13.9*  CREATININE 1.10* 1.17*  --  1.07* 1.01* 0.92  LATICACIDVEN 2.17*  --  2.1* 2.1*  --   --     Estimated Creatinine Clearance: 71.3 mL/min (by C-G formula based on SCr of 0.92 mg/dL).    Allergies  Allergen Reactions  . Penicillins Hives    Childhood allergy could have been more reaction only remembers hives.  07/23/17: Tolerated Zosyn    Antimicrobials this admission: 8/2 Zosyn >>  8/2 Vanc >>   Dose adjustments this admission: 8/6 0930 VT = ___ mcg/mL on 1250mg  IV q24h  Microbiology results: 8/2 BCx: NGTD 8/2 UCx: NG 8/2 MRSA PCR: sent for cx 8/4 MRSA cx: 8/4 BCx   Thank you for allowing pharmacy to be a part of this patient's care.  Juliette Alcideustin Zeigler, PharmD, BCPS.   Pager: [phone] 07/26/2017 12:01 PM

## 2017-07-26 NOTE — Progress Notes (Signed)
PROGRESS NOTE    Laurie [name]  ZOX:[id number] DOB: 1946/12/01 DOA: 07/23/2017 PCP: Rometta Emery, MD   Brief Narrative:   71 year old female with past medical history of chronic diastolic heart failure, diabetes, status post right below-knee amputation and peripheral vascular disease came to the ER with complaints of generalized body aches. In the ER she was noted to be septic with elevated lactic acid without any obvious source of infection. She was started on broad-spectrum antibiotics and admitted for further management.  Assessment & Plan:   Principal Problem:   Sepsis (HCC) Active Problems:   Diabetes mellitus type 2 with peripheral artery disease (HCC)   Anxiety state   Essential hypertension   Chronic diastolic heart failure, grade 2   Dyslipidemia   Diabetic foot ulcer (HCC)   Obesity (BMI 30-39.9)   Presumed Cellulitis versus pneumonia versus infected diabetic wound -Unknown source of exact etiology. Cultures are drawn - no growth to date. Plan on repeating blood cultures if she continues to spike temperature. -UA is negative, chest x-ray shows mild cardiomegaly with some vascular congestion without any signs of pneumonia. ProBNP is 24.5. Pro-calcitonin 2.36 -> 16.58 -Tylenol when necessary. Broad-spectrum antibiotics vancomycin and Zosyn. -LE Doppers is neg for DVT - hep drip discontinued  -Continue IV fluids. Monitor urine output  Diabetic foot ulcer -X-ray the foot is negative for any signs of osteomyelitis, will order MRI foot to rule out osteo. Wound care team has seen the patient-appreciate recommendations.  Left hip pain; chronic -She states this is chronic and from her bursitis. CT A/P done at the time of the admission was neg for acute pathology in the hip arae.  -Restarted pain medications  1.4 cm liver lesion- metastatic disease versus atypical hemangioma versus small hepatic abscess -Patient has never had colonoscopy. She needs to follow this up  outpatient. She will need colonoscopy once her acute sickness has been taken care of. -We'll obtain MRI of the abdomen if necessary  Diabetes type 2 -Accu-Cheks and sliding scale. Hemoglobin A1c 11.5 -Lantus 40 units.  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 According to previous vascular note from December 2017 determined patient is a poor candidate for revascularization.  Essential hypertension -On clonidine 0.2 mg 3 times daily, lisinopril 20 mg daily- started today -Hold off on torsemide 100 mg daily.  Hyperlipidemia -On statin  DVT prophylaxis: Lovenox Code Status: Full code Family Communication: None at bedside. Spoke with Shanda Bumps, over the phone.  Disposition Plan: To be determined  Consultants:   Spoke with Dr Bruchy Hahn from ID- appreciate her input. Will await MRI results of her foot.   Procedures:   None  Antimicrobials:   Vanc  Zosyn   Subjective: No new complaints this morning beside her hip pain with movement.   Objective: Vitals:   07/25/17 0651 07/25/17 1500 07/25/17 2220 07/26/17 0500  BP: (!) 169/72 (!) 139/99 (!) 149/68 (!) 148/71  Pulse: (!) 107 (!) 116 100 97  Resp: (!) 24 (!) 24 (!) 22 20  Temp: 99.1 F (37.3 C) (!) 100.4 F (38 C) 99.6 F (37.6 C) 99 F (37.2 C)  TempSrc: Oral Oral Oral Oral  SpO2: 92% (!) 89% 93% 99%  Weight:      Height:        Intake/Output Summary (Last 24 hours) at 07/26/17 1134 Last data filed at 07/26/17 0700  Gross per 24 hour  Intake  3785.78 ml  Output             2700 ml  Net          1085.78 ml   Filed Weights   07/23/17 1505  Weight: 100.7 kg (222 lb 0.1 oz)    Examination:  General exam: Appears calm and comfortable  Respiratory system: Clear to auscultation. Respiratory effort normal. Cardiovascular system: S1 & S2 heard, RRR. No JVD, murmurs, rubs, gallops or clicks. No pedal  edema. Gastrointestinal system: Abdomen is nondistended, soft and nontender. No organomegaly or masses felt. Normal bowel sounds heard. Central nervous system: Alert and oriented. No focal neurological deficits. Extremities: Left lower extremity swelling and erythema noted which appears slightly improved. Right-sided BKA noted.  Skin: Left lower extremity erythema and swelling with some warmth but no obvious fluctuation Psychiatry: Judgement and insight appear normal. Mood & affect appropriate.     Data Reviewed:   CBC:  Recent Labs Lab 07/23/17 1048 07/23/17 1049 07/24/17 0125 07/25/17 0515 07/26/17 0510  WBC  --  10.3 18.8* 19.6* 13.9*  NEUTROABS  --  9.4*  --   --   --   HGB 11.2* 11.1* 10.3* 10.4* 10.4*  HCT 33.0* 33.9* 31.1* 31.4* 29.8*  MCV  --  91.9 91.7 91.8 88.7  PLT  --  272 247 243 224   Basic Metabolic Panel:  Recent Labs Lab 07/23/17 1048 07/23/17 1049 07/24/17 0125 07/25/17 0515 07/26/17 0510  NA 141 139 141 140 141  K 3.4* 3.4* 3.6 3.0* 3.3*  CL 104 104 110 110 110  CO2  --  25 23 21* 21*  GLUCOSE 248* 244* 186* 163* 121*  BUN 28* 28* 20 14 13   CREATININE 1.10* 1.17* 1.07* 1.01* 0.92  CALCIUM  --  8.6* 7.8* 7.5* 7.6*  MG  --   --   --  1.4* 1.7   GFR: Estimated Creatinine Clearance: 71.3 mL/min (by C-G formula based on SCr of 0.92 mg/dL). Liver Function Tests:  Recent Labs Lab 07/23/17 1049 07/25/17 0515 07/26/17 0510  AST 22 26 18   ALT 15 20 18   ALKPHOS 92 76 73  BILITOT 0.4 0.5 0.3  PROT 7.0 5.9* 6.0*  ALBUMIN 3.3* 2.3* 2.2*   No results for input(s): LIPASE, AMYLASE in the last 168 hours. No results for input(s): AMMONIA in the last 168 hours. Coagulation Profile:  Recent Labs Lab 07/23/17 2225  INR 1.11   Cardiac Enzymes: No results for input(s): CKTOTAL, CKMB, CKMBINDEX, TROPONINI in the last 168 hours. BNP (last 3 results) No results for input(s): PROBNP in the last 8760 hours. HbA1C:  Recent Labs  07/23/17 2225   HGBA1C 11.5*   CBG:  Recent Labs Lab 07/25/17 0756 07/25/17 1238 07/25/17 1742 07/25/17 2313 07/26/17 0738  GLUCAP 150* 105* 159* 149* 105*   Lipid Profile: No results for input(s): CHOL, HDL, LDLCALC, TRIG, CHOLHDL, LDLDIRECT in the last 72 hours. Thyroid Function Tests: No results for input(s): TSH, T4TOTAL, FREET4, T3FREE, THYROIDAB in the last 72 hours. Anemia Panel: No results for input(s): VITAMINB12, FOLATE, FERRITIN, TIBC, IRON, RETICCTPCT in the last 72 hours. Sepsis Labs:  Recent Labs Lab 07/23/17 1048 07/23/17 1049 07/23/17 2225 07/24/17 0125 07/25/17 0515  PROCALCITON  --  2.36  --   --  16.58  LATICACIDVEN 2.17*  --  2.1* 2.1*  --     Recent Results (from the past 240 hour(s))  Blood Culture (routine x 2)     Status: None (Preliminary result)  Collection Time: 07/23/17 10:37 AM  Result Value Ref Range Status   Specimen Description BLOOD LEFT ANTECUBITAL  Final   Special Requests   Final    BOTTLES DRAWN AEROBIC AND ANAEROBIC Blood Culture adequate volume   Culture   Final    NO GROWTH 2 DAYS Performed at [facility], [address] [place], [place] [address]    Report Status PENDING  Incomplete  Urine culture     Status: None   Collection Time: 07/23/17 11:51 AM  Result Value Ref Range Status   Specimen Description URINE, CATHETERIZED  Final   Special Requests NONE  Final   Culture   Final    NO GROWTH Performed at [facility], [address] [place], [place] [address]    Report Status 07/24/2017 FINAL  Final  Blood Culture (routine x 2)     Status: None (Preliminary result)   Collection Time: 07/23/17  4:39 PM  Result Value Ref Range Status   Specimen Description BLOOD BLOOD RIGHT HAND  Final   Special Requests IN PEDIATRIC BOTTLE Blood Culture adequate volume  Final   Culture   Final    NO GROWTH 2 DAYS Performed at [facility], [address] [place], [place] [address]    Report Status PENDING  Incomplete  MRSA  PCR Screening     Status: Abnormal   Collection Time: 07/24/17 11:47 PM  Result Value Ref Range Status   MRSA by PCR INVALID RESULTS, SPECIMEN SENT FOR CULTURE (A) NEGATIVE Final    Comment:        The GeneXpert MRSA Assay (FDA approved for NASAL specimens only), is one component of a comprehensive MRSA colonization surveillance program. It is not intended to diagnose MRSA infection nor to guide or monitor treatment for MRSA infections. RESULT CALLED TO, READ BACK BY AND VERIFIED WITH: D BROWN AT 0017 ON 08.04.2018 BY NBROOKS   MRSA PCR Screening     Status: Abnormal   Collection Time: 07/25/17  9:21 AM  Result Value Ref Range Status   MRSA by PCR INVALID RESULTS, SPECIMEN SENT FOR CULTURE (A) NEGATIVE Final    Comment: RESULT CALLED TO, READ BACK BY AND VERIFIED WITH: BLACKWELL, S AT 1725 ON 07/25/17 BY INGLEE        The GeneXpert MRSA Assay (FDA approved for NASAL specimens only), is one component of a comprehensive MRSA colonization surveillance program. It is not intended to diagnose MRSA infection nor to guide or monitor treatment for MRSA infections.          Radiology Studies: No results found.      Scheduled Meds: . atorvastatin  20 mg Oral q1800  . cloNIDine  0.2 mg Oral TID  . clopidogrel  75 mg Oral Daily  . DULoxetine  60 mg Oral Daily  . gabapentin  300 mg Oral BID  . insulin aspart  0-20 Units Subcutaneous TID WC  . insulin aspart  0-5 Units Subcutaneous QHS  . insulin detemir  40 Units Subcutaneous QHS  . lamoTRIgine  100 mg Oral QHS  . lisinopril  20 mg Oral Daily  . oxybutynin  5 mg Oral QHS   Continuous Infusions: . sodium chloride 100 mL/hr at 07/25/17 0834  . piperacillin-tazobactam (ZOSYN)  IV Stopped (07/26/17 0852)  . vancomycin 1,250 mg (07/26/17 1046)     LOS: 3 days    Time spent: 35 mins     Winfrey Chillemi Joline Maxcyhirag Rubyann Lingle, MD [facility] Pager  [phone]   If 7PM-7AM, please contact  night-coverage [email or web address] Password [email or web address] 07/26/2017, 11:34 AM

## 2017-07-27 LAB — MRSA CULTURE: CULTURE: NOT DETECTED

## 2017-07-27 LAB — COMPREHENSIVE METABOLIC PANEL
ALBUMIN: 2.3 g/dL — AB (ref 3.5–5.0)
ALK PHOS: 76 U/L (ref 38–126)
ALT: 17 U/L (ref 14–54)
ANION GAP: 8 (ref 5–15)
AST: 24 U/L (ref 15–41)
BILIRUBIN TOTAL: 0.5 mg/dL (ref 0.3–1.2)
BUN: 13 mg/dL (ref 6–20)
CHLORIDE: 111 mmol/L (ref 101–111)
CO2: 20 mmol/L — AB (ref 22–32)
Calcium: 7.7 mg/dL — ABNORMAL LOW (ref 8.9–10.3)
Creatinine, Ser: 0.97 mg/dL (ref 0.44–1.00)
GFR calc Af Amer: >60 mL/min (ref >60)
GFR, EST NON AFRICAN AMERICAN: 59 mL/min — AB (ref >60)
GLUCOSE: 109 mg/dL — AB (ref 65–99)
POTASSIUM: 4.1 mmol/L (ref 3.5–5.1)
Sodium: 139 mmol/L (ref 135–145)
TOTAL PROTEIN: 6.3 g/dL — AB (ref 6.5–8.1)

## 2017-07-27 LAB — GLUCOSE, CAPILLARY
GLUCOSE-CAPILLARY: 82 mg/dL (ref 65–99)
GLUCOSE-CAPILLARY: 101 mg/dL — AB (ref 65–99)
Glucose-Capillary: 100 mg/dL — ABNORMAL HIGH (ref 65–99)
Glucose-Capillary: 59 mg/dL — ABNORMAL LOW (ref 65–99)
Glucose-Capillary: 92 mg/dL (ref 65–99)

## 2017-07-27 LAB — BASIC METABOLIC PANEL
Anion gap: 9 (ref 5–15)
BUN: 12 mg/dL (ref 6–20)
CHLORIDE: 112 mmol/L — AB (ref 101–111)
CO2: 19 mmol/L — ABNORMAL LOW (ref 22–32)
CREATININE: 0.9 mg/dL (ref 0.44–1.00)
Calcium: 7.8 mg/dL — ABNORMAL LOW (ref 8.9–10.3)
GFR calc Af Amer: >60 mL/min (ref >60)
GFR calc non Af Amer: >60 mL/min (ref >60)
Glucose, Bld: 103 mg/dL — ABNORMAL HIGH (ref 65–99)
Potassium: 3.8 mmol/L (ref 3.5–5.1)
SODIUM: 140 mmol/L (ref 135–145)

## 2017-07-27 LAB — MAGNESIUM
Magnesium: 1.9 mg/dL (ref 1.7–2.4)
Magnesium: 1.9 mg/dL (ref 1.7–2.4)

## 2017-07-27 LAB — PROCALCITONIN: Procalcitonin: 5.7 ng/mL

## 2017-07-27 LAB — VANCOMYCIN, TROUGH: Vancomycin Tr: 10 ug/mL — ABNORMAL LOW (ref 15–20)

## 2017-07-27 MED ORDER — IPRATROPIUM-ALBUTEROL 0.5-2.5 (3) MG/3ML IN SOLN
3.0000 mL | Freq: Four times a day (QID) | RESPIRATORY_TRACT | Status: DC | PRN
Start: 2017-07-27 — End: 2017-08-02
  Administered 2017-07-27 – 2017-07-28 (×2): 3 mL via RESPIRATORY_TRACT
  Filled 2017-07-27 (×2): qty 3

## 2017-07-27 MED ORDER — ENOXAPARIN SODIUM 60 MG/0.6ML ~~LOC~~ SOLN
0.5000 mg/kg | SUBCUTANEOUS | Status: DC
  Administered 2017-07-27 – 2017-07-29 (×3): 50 mg via SUBCUTANEOUS
  Filled 2017-07-27 (×3): qty 0.6

## 2017-07-27 MED ORDER — VANCOMYCIN HCL IN DEXTROSE 750-5 MG/150ML-% IV SOLN
750.0000 mg | Freq: Two times a day (BID) | INTRAVENOUS | Status: DC
  Administered 2017-07-28: 750 mg via INTRAVENOUS
  Filled 2017-07-27: qty 150

## 2017-07-27 MED ORDER — GABAPENTIN 400 MG PO CAPS
400.0000 mg | ORAL_CAPSULE | Freq: Two times a day (BID) | ORAL | Status: DC
  Administered 2017-07-27 – 2017-08-02 (×13): 400 mg via ORAL
  Filled 2017-07-27 (×13): qty 1

## 2017-07-27 NOTE — Progress Notes (Signed)
Patient had a 3 beat run of vtach. VSS. Pt asymptomatic. MD on call notified. New orders placed. Will continue to monitor closely

## 2017-07-27 NOTE — Progress Notes (Signed)
PROGRESS NOTE    Laurie [name]  ZOX:[id number] DOB: 1946-03-19 DOA: 07/23/2017 PCP: Rometta Emery, MD   Brief Narrative:   71 year old female with past medical history of chronic diastolic heart failure, diabetes, status post right below-knee amputation and peripheral vascular disease came to the ER with complaints of generalized body aches. In the ER she was noted to be septic with elevated lactic acid without any obvious source of infection. She was started on broad-spectrum antibiotics and admitted for further management.  Assessment & Plan:   Principal Problem:   Sepsis (HCC) Active Problems:   Diabetes mellitus type 2 with peripheral artery disease (HCC)   Anxiety state   Essential hypertension   Chronic diastolic heart failure, grade 2   Dyslipidemia   Diabetic foot ulcer (HCC)   Obesity (BMI 30-39.9)   Presumed Cellulitis versus pneumonia versus infected diabetic wound -Unknown source of exact etiology. On and off low-grade fever -Blood cultures 8/2 -no growth to date Blood culture 8/4 -no growth today -UA is negative, chest x-ray shows mild cardiomegaly with some vascular congestion without any signs of pneumonia. ProBNP is 24.5.  -Tylenol when necessary. Broad-spectrum antibiotics vancomycin and Zosyn. Infectious disease consulted who will see the patient today. -LE Doppers is neg for DVT - hep drip discontinued  -Continue IV fluids. Monitor urine output  Diabetic foot ulcer -X-ray the foot is negative for any signs of osteomyelitis -MRI of the foot 8/5- questionable osteomyelitis versus reactive or stress change, some cellulitic changes  Left hip pain; chronic -She states this is chronic and from her bursitis. CT A/P done at the time of the admission was neg for acute pathology in the hip arae.  -Restarted pain medications  Abnormal pertinent sounds/wheezing -We'll order nebulizer treatment as needed. If not improved we will order chest x-ray  1.4 cm liver  lesion- metastatic disease versus atypical hemangioma versus small hepatic abscess -Patient has never had colonoscopy. She needs to follow this up outpatient. She will need colonoscopy once her acute sickness has been taken care of. -MRI of the abdomen outpatient  Diabetes type 2 -Accu-Cheks and sliding scale. Hemoglobin A1c 11.5 -Lantus 40 units.  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 According to previous vascular note from December 2017 determined patient is a poor candidate for revascularization.  Essential hypertension -On clonidine 0.2 mg 3 times daily, lisinopril 20 mg daily- started today -Hold off on torsemide 100 mg daily.  Hyperlipidemia -On statin  DVT prophylaxis: Lovenox Code Status: Full code Family Communication: None at bedside. Disposition Plan: To be determined  Consultants:   Infection disease  Procedures:   None  Antimicrobials:   Vanc  Zosyn   Subjective: No acute events overnight. Still reports of her hip pain. Low-grade fever overnight. Tolerating oral diet as best as she can.  Objective: Vitals:   07/26/17 1400 07/26/17 2157 07/26/17 2341 07/27/17 0529  BP: (!) 146/61 (!) 146/70  124/63  Pulse: 92 76  73  Resp: 18 18  18   Temp: 99.7 F (37.6 C) 100.1 F (37.8 C) 99.5 F (37.5 C) 99.8 F (37.7 C)  TempSrc: Oral Oral Oral Oral  SpO2: 95% 93%  99%  Weight:      Height:        Intake/Output Summary (Last 24 hours) at 07/27/17 1236 Last data filed at 07/27/17 0700  Gross per 24 hour  Intake  2800 ml  Output             1200 ml  Net             1600 ml   Filed Weights   07/23/17 1505  Weight: 100.7 kg (222 lb 0.1 oz)    Examination:  General exam: Appears calm and comfortable But some discomfort due to hip Respiratory system: Mild bibasilar wheezing Cardiovascular system: S1 & S2 heard, RRR. No JVD, murmurs, rubs, gallops or  clicks. No pedal edema. Gastrointestinal system: Abdomen is nondistended, soft and nontender. No organomegaly or masses felt. Normal bowel sounds heard. Central nervous system: Alert and oriented. No focal neurological deficits. Extremities: Left lower extremity swelling and erythema appears to be low improved. Some regression of erythema from previously demarcated site Right-sided BKA noted.  Skin: Left lower extremity erythema and swelling with some warmth but no obvious fluctuation, improved Psychiatry: Judgement and insight appear normal. Mood & affect appropriate.     Data Reviewed:   CBC:  Recent Labs Lab 07/23/17 1048 07/23/17 1049 07/24/17 0125 07/25/17 0515 07/26/17 0510  WBC  --  10.3 18.8* 19.6* 13.9*  NEUTROABS  --  9.4*  --   --   --   HGB 11.2* 11.1* 10.3* 10.4* 10.4*  HCT 33.0* 33.9* 31.1* 31.4* 29.8*  MCV  --  91.9 91.7 91.8 88.7  PLT  --  272 247 243 224   Basic Metabolic Panel:  Recent Labs Lab 07/23/17 1049 07/24/17 0125 07/25/17 0515 07/26/17 0510 07/27/17 1003  NA 139 141 140 141 139  K 3.4* 3.6 3.0* 3.3* 4.1  CL 104 110 110 110 111  CO2 25 23 21* 21* 20*  GLUCOSE 244* 186* 163* 121* 109*  BUN 28* 20 14 13 13   CREATININE 1.17* 1.07* 1.01* 0.92 0.97  CALCIUM 8.6* 7.8* 7.5* 7.6* 7.7*  MG  --   --  1.4* 1.7 1.9   GFR: Estimated Creatinine Clearance: 67.6 mL/min (by C-G formula based on SCr of 0.97 mg/dL). Liver Function Tests:  Recent Labs Lab 07/23/17 1049 07/25/17 0515 07/26/17 0510 07/27/17 1003  AST 22 26 18 24   ALT 15 20 18 17   ALKPHOS 92 76 73 76  BILITOT 0.4 0.5 0.3 0.5  PROT 7.0 5.9* 6.0* 6.3*  ALBUMIN 3.3* 2.3* 2.2* 2.3*   No results for input(s): LIPASE, AMYLASE in the last 168 hours. No results for input(s): AMMONIA in the last 168 hours. Coagulation Profile:  Recent Labs Lab 07/23/17 2225  INR 1.11   Cardiac Enzymes: No results for input(s): CKTOTAL, CKMB, CKMBINDEX, TROPONINI in the last 168 hours. BNP (last 3  results) No results for input(s): PROBNP in the last 8760 hours. HbA1C: No results for input(s): HGBA1C in the last 72 hours. CBG:  Recent Labs Lab 07/26/17 1739 07/26/17 2151 07/26/17 2338 07/27/17 0732 07/27/17 1201  GLUCAP 124* 98 130* 82 101*   Lipid Profile: No results for input(s): CHOL, HDL, LDLCALC, TRIG, CHOLHDL, LDLDIRECT in the last 72 hours. Thyroid Function Tests: No results for input(s): TSH, T4TOTAL, FREET4, T3FREE, THYROIDAB in the last 72 hours. Anemia Panel: No results for input(s): VITAMINB12, FOLATE, FERRITIN, TIBC, IRON, RETICCTPCT in the last 72 hours. Sepsis Labs:  Recent Labs Lab 07/23/17 1048 07/23/17 1049 07/23/17 2225 07/24/17 0125 07/25/17 0515 07/27/17 1003  PROCALCITON  --  2.36  --   --  16.58 5.70  LATICACIDVEN 2.17*  --  2.1* 2.1*  --   --  Recent Results (from the past 240 hour(s))  Blood Culture (routine x 2)     Status: None (Preliminary result)   Collection Time: 07/23/17 10:37 AM  Result Value Ref Range Status   Specimen Description BLOOD LEFT ANTECUBITAL  Final   Special Requests   Final    BOTTLES DRAWN AEROBIC AND ANAEROBIC Blood Culture adequate volume   Culture   Final    NO GROWTH 4 DAYS Performed at [facility], [address] [place], [place] [address]    Report Status PENDING  Incomplete  Urine culture     Status: None   Collection Time: 07/23/17 11:51 AM  Result Value Ref Range Status   Specimen Description URINE, CATHETERIZED  Final   Special Requests NONE  Final   Culture   Final    NO GROWTH Performed at [facility], [address] [place], [place] [address]    Report Status 07/24/2017 FINAL  Final  Blood Culture (routine x 2)     Status: None (Preliminary result)   Collection Time: 07/23/17  4:39 PM  Result Value Ref Range Status   Specimen Description BLOOD BLOOD RIGHT HAND  Final   Special Requests IN PEDIATRIC BOTTLE Blood Culture adequate volume  Final   Culture   Final    NO  GROWTH 4 DAYS Performed at [facility], [address] [place], [place] [address]    Report Status PENDING  Incomplete  MRSA PCR Screening     Status: Abnormal   Collection Time: 07/24/17 11:47 PM  Result Value Ref Range Status   MRSA by PCR INVALID RESULTS, SPECIMEN SENT FOR CULTURE (A) NEGATIVE Final    Comment:        The GeneXpert MRSA Assay (FDA approved for NASAL specimens only), is one component of a comprehensive MRSA colonization surveillance program. It is not intended to diagnose MRSA infection nor to guide or monitor treatment for MRSA infections. RESULT CALLED TO, READ BACK BY AND VERIFIED WITH: D BROWN AT 0017 ON 08.04.2018 BY NBROOKS   MRSA PCR Screening     Status: Abnormal   Collection Time: 07/25/17  9:21 AM  Result Value Ref Range Status   MRSA by PCR INVALID RESULTS, SPECIMEN SENT FOR CULTURE (A) NEGATIVE Final    Comment: RESULT CALLED TO, READ BACK BY AND VERIFIED WITH: BLACKWELL, S AT 1725 ON 07/25/17 BY INGLEE        The GeneXpert MRSA Assay (FDA approved for NASAL specimens only), is one component of a comprehensive MRSA colonization surveillance program. It is not intended to diagnose MRSA infection nor to guide or monitor treatment for MRSA infections.   MRSA culture     Status: None   Collection Time: 07/25/17 10:12 AM  Result Value Ref Range Status   Specimen Description NOSE  Final   Special Requests NONE  Final   Culture   Final    NO MRSA DETECTED Performed at [facility], [address] [place], [place] [address]    Report Status 07/27/2017 FINAL  Final  Culture, blood (routine x 2)     Status: None (Preliminary result)   Collection Time: 07/25/17  6:07 PM  Result Value Ref Range Status   Specimen Description BLOOD BLOOD LEFT HAND  Final   Special Requests IN PEDIATRIC BOTTLE Blood Culture adequate volume  Final   Culture   Final    NO GROWTH 1 DAY Performed at [facility], [address], [place],  [place] [address]    Report Status PENDING  Incomplete  Culture, blood (routine x 2)     Status: None (Preliminary result)   Collection Time: 07/25/17  6:07 PM  Result Value Ref Range Status   Specimen Description BLOOD BLOOD RIGHT HAND  Final   Special Requests IN PEDIATRIC BOTTLE Blood Culture adequate volume  Final   Culture   Final    NO GROWTH 1 DAY Performed at [facility], [address] [place], [place] [address]    Report Status PENDING  Incomplete         Radiology Studies: Mr Foot Left Wo Contrast  Result Date: 07/26/2017 CLINICAL DATA:  Open wound on the left heel in a diabetic patient. EXAM: MRI OF THE LEFT HINDFOOT WITHOUT CONTRAST TECHNIQUE: Multiplanar, multisequence MR imaging of the left hindfoot was performed. No intravenous contrast was administered. COMPARISON:  Plain films of the left ankle 07/23/2017. MRI left ankle 12/04/2016. FINDINGS: Bones/Joint/Cartilage A small focus of mild edema is seen in the lateral periphery of the calcaneus posteriorly and inferiorly. Bone marrow signal is otherwise normal. Ligaments Intact. Muscles and Tendons Intact. Soft tissues Intense subcutaneous edema is present about the ankle and dorsum of the foot. No focal fluid collection is identified. IMPRESSION: Small focus of mild marrow edema in the lateral periphery of the calcaneus could be due to osteomyelitis but given low intensity of signal reactive or stress change is favored. Intense subcutaneous edema about the ankle and visualized foot could be due to cellulitis or dependent change. No abscess is identified. Negative for septic joint or tenosynovitis. Electronically Signed   By: Drusilla Kanner M.D.   On: 07/26/2017 17:09        Scheduled Meds: . atorvastatin  20 mg Oral q1800  . cloNIDine  0.2 mg Oral TID  . clopidogrel  75 mg Oral Daily  . DULoxetine  60 mg Oral Daily  . enoxaparin (LOVENOX) injection  0.5 mg/kg Subcutaneous Q24H  . gabapentin  400 mg Oral BID  .  insulin aspart  0-20 Units Subcutaneous TID WC  . insulin aspart  0-5 Units Subcutaneous QHS  . insulin detemir  40 Units Subcutaneous QHS  . lamoTRIgine  100 mg Oral QHS  . lisinopril  20 mg Oral Daily  . oxybutynin  5 mg Oral QHS   Continuous Infusions: . sodium chloride 100 mL/hr at 07/27/17 0258  . piperacillin-tazobactam (ZOSYN)  IV 3.375 g (07/27/17 1229)  . [START ON 07/28/2017] vancomycin       LOS: 4 days    Time spent: 32 mins     Jillian Warth Joline Maxcy, MD [facility] Pager [phone]   If 7PM-7AM, please contact night-coverage [email or web address] Password [facility] 07/27/2017, 12:36 PM

## 2017-07-27 NOTE — Care Management Important Message (Signed)
Important Message  Patient Details  Name: Laurie [name] W Glorioso MRN: [id number] Date of Birth: 1946-03-07   Medicare Important Message Given:  Yes    Caren MacadamFuller, Shivangi Lutz 07/27/2017, 11:43 AMImportant Message  Patient Details  Name: Laurie [name] W Wilbourn MRN: [id number] Date of Birth: 1946-03-07   Medicare Important Message Given:  Yes    Caren MacadamFuller, Tanzie Rothschild 07/27/2017, 11:43 AM

## 2017-07-27 NOTE — Progress Notes (Signed)
Hypoglycemic Event  CBG: 59  Treatment: 15 GM carbohydrate snack  Symptoms: None  Follow-up CBG: Time:2304 CBG Result:92  Possible Reasons for Event: Inadequate meal intake  Comments/MD notified: nurse driven hypoglycemic protocol initiated     Nikiyah Fackler, Dwana Melenahloe Y

## 2017-07-27 NOTE — Progress Notes (Signed)
Pharmacy Antibiotic Note  Laurie [name] is a 71 y.o. female admitted on 07/23/2017 with sepsis.  Patient is from nursing home & reported feeling feverish and with diarrhea. Pharmacy has been consulted for Vancomycin & Zosyn dosing. Sepsis source cellulitis vs. pneumonia vs. Infected diabetic wound.   07/27/2017: Day #5 antibotics  Low grade fever- Tmax 100.49F  WBC improving (07/26/2017)  Renal: SCr now WNL  MRSA PCR invalid - culture negative  Repeat BCx NGTD  PCT 2.36 > 16.58 > 5.7  Allergy to PCN as a child noted, however patient has already received a dose of Zosyn in ED and has not had any adverse reaction per nursing.   MRI of left foot: Small focus of mild marrow edema in the lateral periphery of calcaneus could be due to osteomyelitis but given low intensity of signal reactive or stress change is favored. Intense subcutaneous edema about the ankle and visualized foot could be due to cellulitis or dependent change. No abscess is identified. Negative for septic joint or tenosynovitis.  Vancomycin trough level 10 mcg/mL  Plan:  Adjust Vancomycin to 750mg  IV q12h (aim for trough closer to 15 mcg/mL)  Continue Zosyn 3.375g IV q8h, each dose infused over 4h   Monitor renal function (daily SCr ordered), cx data  F/u ability to narrow antibiotics  Height: 5\' 7"  (170.2 cm) Weight: 222 lb 0.1 oz (100.7 kg) IBW/kg (Calculated) : 61.6  Temp (24hrs), Avg:99.8 F (37.7 C), Min:99.5 F (37.5 C), Max:100.1 F (37.8 C)   Recent Labs Lab 07/23/17 1048 07/23/17 1049 07/23/17 2225 07/24/17 0125 07/25/17 0515 07/26/17 0510 07/27/17 1003  WBC  --  10.3  --  18.8* 19.6* 13.9*  --   CREATININE 1.10* 1.17*  --  1.07* 1.01* 0.92 0.97  LATICACIDVEN 2.17*  --  2.1* 2.1*  --   --   --   VANCOTROUGH  --   --   --   --   --   --  10*    Estimated Creatinine Clearance: 67.6 mL/min (by C-G formula based on SCr of 0.97 mg/dL).    Allergies  Allergen Reactions  . Penicillins Hives   Childhood allergy could have been more reaction only remembers hives.  07/23/17: Tolerated Zosyn    Antimicrobials this admission: 8/2 Zosyn >>  8/2 Vanc >>   Dose adjustments this admission: 8/6 1003 VT = 10 mcg/mL on 1250mg  IV q24h, adjust to 750mg  IV q12h  Microbiology results: 8/2BCx: NGTD 8/2UCx: NGF 8/3 MRSA PCR: invalid results 8/4 MRSA PCR: invalid results, sent for cx 8/4 MRSA cx: no MRSA detected 8/4 BCx: sent   Thank you for allowing pharmacy to be a part of this patient's care.   Laurie [name], PharmD, BCPS Pager: [phone] 07/27/2017 12:03 PM

## 2017-07-28 ENCOUNTER — Inpatient Hospital Stay (HOSPITAL_COMMUNITY): Payer: Medicare Other

## 2017-07-28 DIAGNOSIS — L97401 Non-pressure chronic ulcer of unspecified heel and midfoot limited to breakdown of skin: Secondary | ICD-10-CM

## 2017-07-28 DIAGNOSIS — M25559 Pain in unspecified hip: Secondary | ICD-10-CM

## 2017-07-28 DIAGNOSIS — E8779 Other fluid overload: Secondary | ICD-10-CM

## 2017-07-28 DIAGNOSIS — M7989 Other specified soft tissue disorders: Secondary | ICD-10-CM

## 2017-07-28 DIAGNOSIS — R0602 Shortness of breath: Secondary | ICD-10-CM

## 2017-07-28 DIAGNOSIS — G8929 Other chronic pain: Secondary | ICD-10-CM

## 2017-07-28 DIAGNOSIS — E08621 Diabetes mellitus due to underlying condition with foot ulcer: Secondary | ICD-10-CM

## 2017-07-28 LAB — COMPREHENSIVE METABOLIC PANEL
ALT: 15 U/L (ref 14–54)
ANION GAP: 10 (ref 5–15)
AST: 19 U/L (ref 15–41)
Albumin: 2 g/dL — ABNORMAL LOW (ref 3.5–5.0)
Alkaline Phosphatase: 77 U/L (ref 38–126)
BILIRUBIN TOTAL: 0.5 mg/dL (ref 0.3–1.2)
BUN: 11 mg/dL (ref 6–20)
CO2: 17 mmol/L — ABNORMAL LOW (ref 22–32)
Calcium: 7.8 mg/dL — ABNORMAL LOW (ref 8.9–10.3)
Chloride: 114 mmol/L — ABNORMAL HIGH (ref 101–111)
Creatinine, Ser: 0.91 mg/dL (ref 0.44–1.00)
Glucose, Bld: 150 mg/dL — ABNORMAL HIGH (ref 65–99)
Potassium: 4.2 mmol/L (ref 3.5–5.1)
Sodium: 141 mmol/L (ref 135–145)
TOTAL PROTEIN: 6.5 g/dL (ref 6.5–8.1)

## 2017-07-28 LAB — CULTURE, BLOOD (ROUTINE X 2)
CULTURE: NO GROWTH
Culture: NO GROWTH
SPECIAL REQUESTS: ADEQUATE
Special Requests: ADEQUATE

## 2017-07-28 LAB — GLUCOSE, CAPILLARY
GLUCOSE-CAPILLARY: 142 mg/dL — AB (ref 65–99)
GLUCOSE-CAPILLARY: 133 mg/dL — AB (ref 65–99)
GLUCOSE-CAPILLARY: 71 mg/dL (ref 65–99)
Glucose-Capillary: 133 mg/dL — ABNORMAL HIGH (ref 65–99)

## 2017-07-28 LAB — MAGNESIUM: MAGNESIUM: 1.9 mg/dL (ref 1.7–2.4)

## 2017-07-28 MED ORDER — FUROSEMIDE 10 MG/ML IJ SOLN
40.0000 mg | Freq: Once | INTRAMUSCULAR | Status: AC
  Administered 2017-07-28: 40 mg via INTRAVENOUS
  Filled 2017-07-28: qty 4

## 2017-07-28 MED ORDER — CEFAZOLIN SODIUM-DEXTROSE 2-4 GM/100ML-% IV SOLN
2.0000 g | Freq: Three times a day (TID) | INTRAVENOUS | Status: DC
  Administered 2017-07-28: 2 g via INTRAVENOUS
  Filled 2017-07-28 (×2): qty 100

## 2017-07-28 MED ORDER — VANCOMYCIN HCL IN DEXTROSE 750-5 MG/150ML-% IV SOLN
750.0000 mg | Freq: Two times a day (BID) | INTRAVENOUS | Status: DC
  Administered 2017-07-29 – 2017-07-31 (×5): 750 mg via INTRAVENOUS
  Filled 2017-07-28 (×7): qty 150

## 2017-07-28 MED ORDER — IPRATROPIUM-ALBUTEROL 0.5-2.5 (3) MG/3ML IN SOLN
3.0000 mL | Freq: Three times a day (TID) | RESPIRATORY_TRACT | Status: DC
  Administered 2017-07-29 – 2017-08-02 (×12): 3 mL via RESPIRATORY_TRACT
  Filled 2017-07-28 (×14): qty 3

## 2017-07-28 NOTE — Progress Notes (Signed)
Peripherally Inserted Central Catheter/Midline Placement  The IV Nurse has discussed with the patient and/or persons authorized to consent for the patient, the purpose of this procedure and the potential benefits and risks involved with this procedure.  The benefits include less needle sticks, lab draws from the catheter, and the patient may be discharged home with the catheter. Risks include, but not limited to, infection, bleeding, blood clot (thrombus formation), and puncture of an artery; nerve damage and irregular heartbeat and possibility to perform a PICC exchange if needed/ordered by physician.  Alternatives to this procedure were also discussed.  Bard Power PICC patient education guide, fact sheet on infection prevention and patient information card has been provided to patient /or left at bedside. Awaiting PCXR to confirm tip placement.   PICC/Midline Placement Documentation  PICC Triple Lumen 07/28/17 PICC Right Basilic 47 cm 0 cm (Active)  Indication for Insertion or Continuance of Line Poor Vasculature-patient has had multiple peripheral attempts or PIVs lasting less than 24 hours 07/28/2017  9:53 PM  Exposed Catheter (cm) 0 cm 07/28/2017  9:53 PM  Site Assessment Clean;Dry;Intact 07/28/2017  9:53 PM  Lumen #1 Status Blood return noted;Flushed;Saline locked 07/28/2017  9:53 PM  Lumen #2 Status Blood return noted;Flushed;Saline locked 07/28/2017  9:53 PM  Lumen #3 Status Blood return noted;Flushed;Saline locked 07/28/2017  9:53 PM  Dressing Type Transparent 07/28/2017  9:53 PM  Dressing Status Clean;Dry;Intact;Antimicrobial disc in place 07/28/2017  9:53 PM  Dressing Intervention New dressing 07/28/2017  9:53 PM  Dressing Change Due 08/04/17 07/28/2017  9:53 PM       Netta Corriganhomas, Fae Blossom L 07/28/2017, 9:55 PM

## 2017-07-28 NOTE — Progress Notes (Signed)
Patient refusing lab draws at this time.  Dr. Nelson ChimesAmin aware.  WIll continue to monitor patient.

## 2017-07-28 NOTE — Progress Notes (Signed)
Pharmacy Antibiotic Note  Druscilla Brownienne W Zayed is a 71 y.o. female admitted on 07/23/2017 with sepsis.  Patient is from nursing home & reported feeling feverish and with diarrhea. Sepsis source cellulitis vs. pneumonia vs. infected diabetic wound.  Patient started on vancomycin and zosyn 8/2 then narrowed to cefazolin earlier today (8/7).  Patient still spiking fevers, ID consulted and recommended resuming vancomycin for wound infection.  Plan:  Resume Vancomycin to 750mg  IV q12h  Check trough level at steady state  F/u ID recommendations  Height: 5\' 7"  (170.2 cm) Weight: 222 lb 0.1 oz (100.7 kg) IBW/kg (Calculated) : 61.6  Temp (24hrs), Avg:99.7 F (37.6 C), Min:98.4 F (36.9 C), Max:101.5 F (38.6 C)   Recent Labs Lab 07/23/17 1048 07/23/17 1049 07/23/17 2225 07/24/17 0125 07/25/17 0515 07/26/17 0510 07/27/17 1003 07/27/17 2258 07/28/17 0814  WBC  --  10.3  --  18.8* 19.6* 13.9*  --   --   --   CREATININE 1.10* 1.17*  --  1.07* 1.01* 0.92 0.97 0.90 0.91  LATICACIDVEN 2.17*  --  2.1* 2.1*  --   --   --   --   --   VANCOTROUGH  --   --   --   --   --   --  10*  --   --     Estimated Creatinine Clearance: 72.1 mL/min (by C-G formula based on SCr of 0.91 mg/dL).    Allergies  Allergen Reactions  . Penicillins Hives    Childhood allergy could have been more reaction only remembers hives.  07/23/17: Tolerated Zosyn    Antimicrobials this admission: 8/2 Zosyn >>  8/2 Vanc >>   Dose adjustments this admission: 8/6 1003 VT = 10 mcg/mL on 1250mg  IV q24h, adjust to 750mg  IV q12h  Microbiology results: 8/2BCx: NGTD 8/2UCx: NGF 8/3 MRSA PCR: invalid results 8/4 MRSA PCR: invalid results, sent for cx 8/4 MRSA cx: no MRSA detected 8/4 BCx: sent   Thank you for allowing pharmacy to be a part of this patient's care.   Clance BollAmanda Jayion Schneck, PharmD, BCPS Pager: [phone] 07/28/2017 5:20 PM

## 2017-07-28 NOTE — Progress Notes (Addendum)
CBG 71. Patient with poor appetite. MD on call notified. Per MD to hold Levemir 40 units and recheck CBG in 4 hours. Also received order for IR to place PICC in am. Will continue to monitor closely.

## 2017-07-28 NOTE — Progress Notes (Signed)
Patient c/o itching on her side.  Upon examining patient, her skin was very red and hot to touch in a large area on L side of her abdomen and back of her L upper arm.  This also appeared swollen but difficult to assess due to patient's body habitus.  Area has been marked.  Dr. Nelson ChimesAmin aware.  Patient's IV also appeared infiltrated upon initial administration of vancomycin.  Patient c/o pain at the site.  Patient unable to receive 1800 dose of vanc.  Pharmacy and Dr. Nelson ChimesAmin aware; orders for PICC line have been placed.

## 2017-07-28 NOTE — Consult Note (Signed)
[facility] for Infectious Disease  Total days of antibiotics 6        Day 6 vanco        Day 6 piptazo         Reason for Consult: fever   Referring Physician: amin  Principal Problem:   Sepsis ([place]) Active Problems:   Diabetes mellitus type 2 with peripheral artery disease ([place])   Anxiety state   Essential hypertension   Chronic diastolic heart failure, grade 2   Dyslipidemia   Diabetic foot ulcer ([facility])   Obesity (BMI 30-39.9)    HPI: Laurie [name] is a 71 y.o. female with history of stroke, DM2 with PAD, s/p right leg BKA. Left heel DFU, obesity, SNF resident admitted on 8/2 for fever and malaise. Met sepsis criteria and started on vanco and piptazo. cxr was negative for pneumonia. No dysuria. Had mri of foot since there was concern for possible osteo which was negative. Starting to have some wheezing and had fever today  Past Medical History:  Diagnosis Date  . Anemia   . Anxiety   . Bacteremia   . Bursitis    "left hip; sometimes left shoulder" (03/05/2015)  . Chest pain    Troponins up to 0.12 while hospitalized for E.coli PNA  . Depression   . Family history of adverse reaction to anesthesia    "brother died 07-06-13; woke up w/headache, vomited blood; drilled holes in his head; bleeding stroke; never came back" (03/05/2015)  . Headache    "when someone gets on my nerves" (03/05/2015)  . High cholesterol   . Hypertension   . Narcolepsy   . 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 Stroke [facility]) 10/2014   "weaker on left side since" (03/05/2015)  . Type II diabetes mellitus ([place])   . Walking pneumonia 04/2009   "double" /notes 04/23/2011    Allergies:  Allergies  Allergen Reactions  . Penicillins Hives    Childhood allergy could have been more reaction only remembers hives.  07/23/17: Tolerated Zosyn   MEDICATIONS: . atorvastatin  20 mg Oral q1800  . cloNIDine  0.2 mg Oral TID  . clopidogrel  75 mg Oral Daily  . DULoxetine  60 mg Oral  Daily  . enoxaparin (LOVENOX) injection  0.5 mg/kg Subcutaneous Q24H  . gabapentin  400 mg Oral BID  . insulin aspart  0-20 Units Subcutaneous TID WC  . insulin aspart  0-5 Units Subcutaneous QHS  . insulin detemir  40 Units Subcutaneous QHS  . lamoTRIgine  100 mg Oral QHS  . lisinopril  20 mg Oral Daily  . oxybutynin  5 mg Oral QHS    Social History  Substance Use Topics  . Smoking status: Former Smoker    Packs/day: 0.10    Years: 40.00    Types: Cigarettes    Quit date: 11/21/2016  . Smokeless tobacco: Never Used  . Alcohol use 0.0 oz/week     Comment: 03/05/2015 "Might have a drink on holidays"    Family History  Problem Relation Age of Onset  . CAD Mother   . CAD Father      Review of Systems  Constitutional: Negative for fever, chills, diaphoresis, activity change, appetite change, fatigue and unexpected weight change.  HENT: Negative for congestion, sore throat, rhinorrhea, sneezing, trouble swallowing and sinus pressure.  Eyes: Negative for photophobia and visual disturbance.  Respiratory: + cough and wheezing.  shortness of breath,  and stridor.  Cardiovascular: Negative for chest pain, palpitations and leg swelling.  Gastrointestinal: Negative for nausea, vomiting, abdominal pain, diarrhea, constipation, blood in stool, abdominal distention and anal bleeding.  Genitourinary: Negative for dysuria, hematuria, flank pain and difficulty urinating.  Musculoskeletal: Negative for myalgias, back pain, joint swelling, arthralgias and gait problem.  Skin: Negative for color change, pallor, rash and wound.  Neurological: Negative for dizziness, tremors, weakness and light-headedness.  Hematological: Negative for adenopathy. Does not bruise/bleed easily.  Psychiatric/Behavioral: Negative for behavioral problems, confusion, sleep disturbance, dysphoric mood, decreased concentration and agitation.     OBJECTIVE: Temp:  [98.4 F (36.9 C)-101.5 F (38.6 C)] 101.5 F (38.6  C) (08/07 1419) Pulse Rate:  [79-88] 88 (08/07 1419) Resp:  [18-20] 18 (08/07 1419) BP: (142-152)/(62-65) 146/65 (08/07 1419) SpO2:  [92 %-100 %] 94 % (08/07 1419) Physical Exam  Constitutional:  oriented to person, place, and time. appears well-developed and well-nourished. No distress.  HENT: [place]/AT, PERRLA, no scleral icterus Mouth/Throat: Oropharynx is clear and moist. No oropharyngeal exudate.  Cardiovascular: Normal rate, regular rhythm and normal heart sounds. Exam reveals no gallop and no friction rub.  No murmur heard.  Pulmonary/Chest: Effort normal and breath sounds normal. No respiratory distress.  + exp  wheezes.  Neck = supple, no nuchal rigidity Abdominal: Soft. Bowel sounds are normal.  exhibits no distension. There is no tenderness.  Lymphadenopathy: no cervical adenopathy. No axillary adenopathy Neurological: alert and oriented to person, place, and time. Ext: right bka. lymphadema changes to left leg  Skin: left heel has good granulation tissue to ulcer. No foul odor no drainage.vascular congestion changes noted Psychiatric: a normal mood and affect.  behavior is normal.   LABS: Results for orders placed or performed during the hospital encounter of 07/23/17 (from the past 48 hour(s))  Glucose, capillary     Status: Abnormal   Collection Time: 07/26/17  5:39 PM  Result Value Ref Range   Glucose-Capillary 124 (H) 65 - 99 mg/dL   Comment 1 Notify RN    Comment 2 Document in Chart   Glucose, capillary     Status: None   Collection Time: 07/26/17  9:51 PM  Result Value Ref Range   Glucose-Capillary 98 65 - 99 mg/dL  Glucose, capillary     Status: Abnormal   Collection Time: 07/26/17 11:38 PM  Result Value Ref Range   Glucose-Capillary 130 (H) 65 - 99 mg/dL  Glucose, capillary     Status: None   Collection Time: 07/27/17  7:32 AM  Result Value Ref Range   Glucose-Capillary 82 65 - 99 mg/dL  Procalcitonin     Status: None   Collection Time: 07/27/17 10:03 AM    Result Value Ref Range   Procalcitonin 5.70 ng/mL    Comment:        Interpretation: PCT > 2 ng/mL: Systemic infection (sepsis) is likely, unless other causes are known. (NOTE)         ICU PCT Algorithm               Non ICU PCT Algorithm    ----------------------------     ------------------------------         PCT < 0.25 ng/mL                 PCT < 0.1 ng/mL     Stopping of antibiotics            Stopping of antibiotics       strongly encouraged.  strongly encouraged.    ----------------------------     ------------------------------       PCT level decrease by               PCT < 0.25 ng/mL       >= 80% from peak PCT       OR PCT 0.25 - 0.5 ng/mL          Stopping of antibiotics                                             encouraged.     Stopping of antibiotics           encouraged.    ----------------------------     ------------------------------       PCT level decrease by              PCT >= 0.25 ng/mL       < 80% from peak PCT        AND PCT >= 0.5 ng/mL            Continuing antibiotics                                               encouraged.       Continuing antibiotics            encouraged.    ----------------------------     ------------------------------     PCT level increase compared          PCT > 0.5 ng/mL         with peak PCT AND          PCT >= 0.5 ng/mL             Escalation of antibiotics                                          strongly encouraged.      Escalation of antibiotics        strongly encouraged.   Comprehensive metabolic panel     Status: Abnormal   Collection Time: 07/27/17 10:03 AM  Result Value Ref Range   Sodium 139 135 - 145 mmol/L   Potassium 4.1 3.5 - 5.1 mmol/L    Comment: DELTA CHECK NOTED REPEATED TO VERIFY SLIGHT HEMOLYSIS    Chloride 111 101 - 111 mmol/L   CO2 20 (L) 22 - 32 mmol/L   Glucose, Bld 109 (H) 65 - 99 mg/dL   BUN 13 6 - 20 mg/dL   Creatinine, Ser 0.97 0.44 - 1.00 mg/dL   Calcium 7.7 (L) 8.9 -  10.3 mg/dL   Total Protein 6.3 (L) 6.5 - 8.1 g/dL   Albumin 2.3 (L) 3.5 - 5.0 g/dL   AST 24 15 - 41 U/L   ALT 17 14 - 54 U/L   Alkaline Phosphatase 76 38 - 126 U/L   Total Bilirubin 0.5 0.3 - 1.2 mg/dL   GFR calc non Af Amer 59 (L) >60 mL/min   GFR calc Af Amer >60 >60 mL/min    Comment: (NOTE) The eGFR has been calculated using the CKD EPI equation.  This calculation has not been validated in all clinical situations. eGFR's persistently <60 mL/min signify possible Chronic Kidney Disease.    Anion gap 8 5 - 15  Magnesium     Status: None   Collection Time: 07/27/17 10:03 AM  Result Value Ref Range   Magnesium 1.9 1.7 - 2.4 mg/dL  Vancomycin, trough     Status: Abnormal   Collection Time: 07/27/17 10:03 AM  Result Value Ref Range   Vancomycin Tr 10 (L) 15 - 20 ug/mL  Glucose, capillary     Status: Abnormal   Collection Time: 07/27/17 12:01 PM  Result Value Ref Range   Glucose-Capillary 101 (H) 65 - 99 mg/dL  Glucose, capillary     Status: Abnormal   Collection Time: 07/27/17  4:42 PM  Result Value Ref Range   Glucose-Capillary 100 (H) 65 - 99 mg/dL  Glucose, capillary     Status: Abnormal   Collection Time: 07/27/17  9:44 PM  Result Value Ref Range   Glucose-Capillary 59 (L) 65 - 99 mg/dL  Basic metabolic panel     Status: Abnormal   Collection Time: 07/27/17 10:58 PM  Result Value Ref Range   Sodium 140 135 - 145 mmol/L   Potassium 3.8 3.5 - 5.1 mmol/L   Chloride 112 (H) 101 - 111 mmol/L   CO2 19 (L) 22 - 32 mmol/L   Glucose, Bld 103 (H) 65 - 99 mg/dL   BUN 12 6 - 20 mg/dL   Creatinine, Ser 0.90 0.44 - 1.00 mg/dL   Calcium 7.8 (L) 8.9 - 10.3 mg/dL   GFR calc non Af Amer >60 >60 mL/min   GFR calc Af Amer >60 >60 mL/min    Comment: (NOTE) The eGFR has been calculated using the CKD EPI equation. This calculation has not been validated in all clinical situations. eGFR's persistently <60 mL/min signify possible Chronic Kidney Disease.    Anion gap 9 5 - 15  Magnesium      Status: None   Collection Time: 07/27/17 10:58 PM  Result Value Ref Range   Magnesium 1.9 1.7 - 2.4 mg/dL  Glucose, capillary     Status: None   Collection Time: 07/27/17 11:04 PM  Result Value Ref Range   Glucose-Capillary 92 65 - 99 mg/dL  Glucose, capillary     Status: Abnormal   Collection Time: 07/28/17  1:24 AM  Result Value Ref Range   Glucose-Capillary 133 (H) 65 - 99 mg/dL  Glucose, capillary     Status: Abnormal   Collection Time: 07/28/17  7:51 AM  Result Value Ref Range   Glucose-Capillary 142 (H) 65 - 99 mg/dL  Comprehensive metabolic panel     Status: Abnormal   Collection Time: 07/28/17  8:14 AM  Result Value Ref Range   Sodium 141 135 - 145 mmol/L   Potassium 4.2 3.5 - 5.1 mmol/L   Chloride 114 (H) 101 - 111 mmol/L   CO2 17 (L) 22 - 32 mmol/L   Glucose, Bld 150 (H) 65 - 99 mg/dL   BUN 11 6 - 20 mg/dL   Creatinine, Ser 0.91 0.44 - 1.00 mg/dL   Calcium 7.8 (L) 8.9 - 10.3 mg/dL   Total Protein 6.5 6.5 - 8.1 g/dL   Albumin 2.0 (L) 3.5 - 5.0 g/dL   AST 19 15 - 41 U/L   ALT 15 14 - 54 U/L   Alkaline Phosphatase 77 38 - 126 U/L   Total Bilirubin 0.5 0.3 - 1.2 mg/dL  GFR calc non Af Amer >60 >60 mL/min   GFR calc Af Amer >60 >60 mL/min    Comment: (NOTE) The eGFR has been calculated using the CKD EPI equation. This calculation has not been validated in all clinical situations. eGFR's persistently <60 mL/min signify possible Chronic Kidney Disease.    Anion gap 10 5 - 15  Magnesium     Status: None   Collection Time: 07/28/17  8:14 AM  Result Value Ref Range   Magnesium 1.9 1.7 - 2.4 mg/dL  Glucose, capillary     Status: Abnormal   Collection Time: 07/28/17 12:15 PM  Result Value Ref Range   Glucose-Capillary 133 (H) 65 - 99 mg/dL    MICRO: reviewed IMAGING: Mr Foot Left Wo Contrast  Result Date: 07/26/2017 CLINICAL DATA:  Open wound on the left heel in a diabetic patient. EXAM: MRI OF THE LEFT HINDFOOT WITHOUT CONTRAST TECHNIQUE: Multiplanar,  multisequence MR imaging of the left hindfoot was performed. No intravenous contrast was administered. COMPARISON:  Plain films of the left ankle 07/23/2017. MRI left ankle 12/04/2016. FINDINGS: Bones/Joint/Cartilage A small focus of mild edema is seen in the lateral periphery of the calcaneus posteriorly and inferiorly. Bone marrow signal is otherwise normal. Ligaments Intact. Muscles and Tendons Intact. Soft tissues Intense subcutaneous edema is present about the ankle and dorsum of the foot. No focal fluid collection is identified. IMPRESSION: Small focus of mild marrow edema in the lateral periphery of the calcaneus could be due to osteomyelitis but given low intensity of signal reactive or stress change is favored. Intense subcutaneous edema about the ankle and visualized foot could be due to cellulitis or dependent change. No abscess is identified. Negative for septic joint or tenosynovitis. Electronically Signed   By: Inge Rise M.D.   On: 07/26/2017 17:09    HISTORICAL MICRO/IMAGING  Assessment/Plan:  Intermittent fevers while hospitalized.initially treated with vanco/piptazo for cellulitis +/- HCAP.  =- place on vancomycin for now, has improved cellulitis  Isolated fever = recommend to check cbc with diff, repeat blood cx, possibly has drug allergy to piptazo  dfu = continue with wound care  Wheezing on exam = likley from mild fluid overload. Recommend dose of lasix.

## 2017-07-28 NOTE — Progress Notes (Signed)
**  Preliminary report by tech**  Bilateral upper extremity venous duplex complete. There is no evidence of deep or superficial vein thrombosis involving the right and left upper extremities. All visualized vessels appear patent and compressible.  07/28/17 4:34 PM Olen CordialGreg Arvel Oquinn RVT

## 2017-07-28 NOTE — Progress Notes (Signed)
PROGRESS NOTE    Laurie [name]  ZOX:[id number] DOB: 05-Sep-1946 DOA: 07/23/2017 PCP: Rometta EmeryGarba, Mohammad L, MD   Brief Narrative:   [age] year old female with past medical history of chronic diastolic heart failure, diabetes, status post right below-knee amputation and peripheral vascular disease came to the ER with complaints of generalized body aches. In the ER she was noted to be septic with elevated lactic acid without any obvious source of infection. She was started on broad-spectrum antibiotics and admitted for further management.  Assessment & Plan:   Principal Problem:   Sepsis (HCC) Active Problems:   Diabetes mellitus type 2 with peripheral artery disease (HCC)   Anxiety state   Essential hypertension   Chronic diastolic heart failure, grade 2   Dyslipidemia   Diabetic foot ulcer (HCC)   Obesity (BMI 30-39.9)   Cellulitis versus pneumonia versus infected diabetic wound -Unknown source of exact etiology. On and off low-grade fever -Blood cultures 8/2 -no growth to date Blood culture 8/4 -no growth today -UA is negative, chest x-ray shows mild cardiomegaly with some vascular congestion without any signs of pneumonia. -Tylenol when necessary. Broad-spectrum antibiotics vancomycin and Zosyn. Discussed with ID- will switch the patient to the Ancef and monitor her.  -LE Doppers is neg for DVT - hep drip discontinued  -IVF on hold due to abnormal BS.   Mild Shortness of Breath with Nonproductive cough Slight Volume Overload -Supplemental oxygen. One dose of 40 mg IV Lasix, monitor urine output. If necessary we'll get a chest x-ray. -Nebulizer in place  Diabetic foot ulcer; stable -X-ray the foot is negative for any signs of osteomyelitis -MRI of the foot 8/5- questionable osteomyelitis versus reactive or stress change, some cellulitic changes  Left hip pain; chronic -She states this is chronic and from her bursitis. CT A/P done at the time of the admission was neg for acute  pathology in the hip arae.  -pain medications  1.4 cm liver lesion- metastatic disease versus atypical hemangioma versus small hepatic abscess -Patient has never had colonoscopy. She needs to follow this up outpatient. She will need colonoscopy once her acute sickness has been taken care of. -MRI of the abdomen outpatient  Diabetes type 2 -Accu-Cheks and sliding scale. Hemoglobin A1c 11.5 -Lantus 40 units.  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 According to previous vascular note from December 2017 determined patient is a poor candidate for revascularization.  Essential hypertension -On clonidine 0.2 mg 3 times daily, lisinopril 20 mg daily -Soon her torsemide 100 mg daily should be restarted.  Hyperlipidemia -On statin  DVT prophylaxis: Lovenox Code Status: Full code Family Communication: None at bedside. Disposition Plan: To be determined  Consultants:   Infection disease  Procedures:   None  Antimicrobials:   Vanc 8/2 > 8/7  Zosyn 8/2 > 8/7   Ancef 8/7 >>>   Subjective: No acute events overnight. Patient reports of poor appetite and chronic hip pain. This morning she also stated she's having some nonproductive cough and requiring oxygen.  Objective: Vitals:   07/27/17 1453 07/27/17 2147 07/27/17 2208 07/28/17 0457  BP: (!) 127/58 (!) 152/62  (!) 142/64  Pulse: 72 79  86  Resp: 18 20  18   Temp: 99.3 F (37.4 C) 98.4 F (36.9 C)  99.3 F (37.4 C)  TempSrc: Oral Oral  Oral  SpO2: 97% 100% 92% 95%  Weight:      Height:        Intake/Output  Summary (Last 24 hours) at 07/28/17 1030 Last data filed at 07/28/17 0847  Gross per 24 hour  Intake          2586.67 ml  Output              760 ml  Net          1826.67 ml   Filed Weights   07/23/17 1505  Weight: 100.7 kg (222 lb 0.1 oz)    Examination:  General exam: Appears calm and comfortable But some discomfort  due to hip Respiratory system: Bibasilar crackles and wheezing Cardiovascular system: S1 & S2 heard, RRR. No JVD, murmurs, rubs, gallops or clicks. No pedal edema. Gastrointestinal system: Abdomen is nondistended, soft and nontender. No organomegaly or masses felt. Normal bowel sounds heard. Central nervous system: Alert and oriented. No focal neurological deficits. Extremities: Left lower extremity swelling and erythema has much improved. No longer tender to palpation. Right-sided BKA noted.  Skin: Left lower extremity erythema and swelling with some warmth but no obvious fluctuation, improved Psychiatry: Judgement and insight appear normal. Mood & affect appropriate.     Data Reviewed:   CBC:  Recent Labs Lab 07/23/17 1048 07/23/17 1049 07/24/17 0125 07/25/17 0515 07/26/17 0510  WBC  --  10.3 18.8* 19.6* 13.9*  NEUTROABS  --  9.4*  --   --   --   HGB 11.2* 11.1* 10.3* 10.4* 10.4*  HCT 33.0* 33.9* 31.1* 31.4* 29.8*  MCV  --  91.9 91.7 91.8 88.7  PLT  --  272 247 243 224   Basic Metabolic Panel:  Recent Labs Lab 07/25/17 0515 07/26/17 0510 07/27/17 1003 07/27/17 2258 07/28/17 0814  NA 140 141 139 140 141  K 3.0* 3.3* 4.1 3.8 4.2  CL 110 110 111 112* 114*  CO2 21* 21* 20* 19* 17*  GLUCOSE 163* 121* 109* 103* 150*  BUN 14 13 13 12 11   CREATININE 1.01* 0.92 0.97 0.90 0.91  CALCIUM 7.5* 7.6* 7.7* 7.8* 7.8*  MG 1.4* 1.7 1.9 1.9 1.9   GFR: Estimated Creatinine Clearance: 72.1 mL/min (by C-G formula based on SCr of 0.91 mg/dL). Liver Function Tests:  Recent Labs Lab 07/23/17 1049 07/25/17 0515 07/26/17 0510 07/27/17 1003 07/28/17 0814  AST 22 26 18 24 19   ALT 15 20 18 17 15   ALKPHOS 92 76 73 76 77  BILITOT 0.4 0.5 0.3 0.5 0.5  PROT 7.0 5.9* 6.0* 6.3* 6.5  ALBUMIN 3.3* 2.3* 2.2* 2.3* 2.0*   No results for input(s): LIPASE, AMYLASE in the last 168 hours. No results for input(s): AMMONIA in the last 168 hours. Coagulation Profile:  Recent Labs Lab  07/23/17 2225  INR 1.11   Cardiac Enzymes: No results for input(s): CKTOTAL, CKMB, CKMBINDEX, TROPONINI in the last 168 hours. BNP (last 3 results) No results for input(s): PROBNP in the last 8760 hours. HbA1C: No results for input(s): HGBA1C in the last 72 hours. CBG:  Recent Labs Lab 07/27/17 1642 07/27/17 2144 07/27/17 2304 07/28/17 0124 07/28/17 0751  GLUCAP 100* 59* 92 133* 142*   Lipid Profile: No results for input(s): CHOL, HDL, LDLCALC, TRIG, CHOLHDL, LDLDIRECT in the last 72 hours. Thyroid Function Tests: No results for input(s): TSH, T4TOTAL, FREET4, T3FREE, THYROIDAB in the last 72 hours. Anemia Panel: No results for input(s): VITAMINB12, FOLATE, FERRITIN, TIBC, IRON, RETICCTPCT in the last 72 hours. Sepsis Labs:  Recent Labs Lab 07/23/17 1048 07/23/17 1049 07/23/17 2225 07/24/17 0125 07/25/17 0515 07/27/17 1003  PROCALCITON  --  2.36  --   --  16.58 5.70  LATICACIDVEN 2.17*  --  2.1* 2.1*  --   --     Recent Results (from the past 240 hour(s))  Blood Culture (routine x 2)     Status: None (Preliminary result)   Collection Time: 07/23/17 10:37 AM  Result Value Ref Range Status   Specimen Description BLOOD LEFT ANTECUBITAL  Final   Special Requests   Final    BOTTLES DRAWN AEROBIC AND ANAEROBIC Blood Culture adequate volume   Culture   Final    NO GROWTH 4 DAYS Performed at [facility], [address], [place], [place] [address]    Report Status PENDING  Incomplete  Urine culture     Status: None   Collection Time: 07/23/17 11:51 AM  Result Value Ref Range Status   Specimen Description URINE, CATHETERIZED  Final   Special Requests NONE  Final   Culture   Final    NO GROWTH Performed at [facility], [address] [place], [place] [address]    Report Status 07/24/2017 FINAL  Final  Blood Culture (routine x 2)     Status: None (Preliminary result)   Collection Time: 07/23/17  4:39 PM  Result Value Ref Range Status   Specimen  Description BLOOD BLOOD RIGHT HAND  Final   Special Requests IN PEDIATRIC BOTTLE Blood Culture adequate volume  Final   Culture   Final    NO GROWTH 4 DAYS Performed at [facility], [address] [place], [place] [address]    Report Status PENDING  Incomplete  MRSA PCR Screening     Status: Abnormal   Collection Time: 07/24/17 11:47 PM  Result Value Ref Range Status   MRSA by PCR INVALID RESULTS, SPECIMEN SENT FOR CULTURE (A) NEGATIVE Final    Comment:        The GeneXpert MRSA Assay (FDA approved for NASAL specimens only), is one component of a comprehensive MRSA colonization surveillance program. It is not intended to diagnose MRSA infection nor to guide or monitor treatment for MRSA infections. RESULT CALLED TO, READ BACK BY AND VERIFIED WITH: D BROWN AT 0017 ON 08.04.2018 BY NBROOKS   MRSA PCR Screening     Status: Abnormal   Collection Time: 07/25/17  9:21 AM  Result Value Ref Range Status   MRSA by PCR INVALID RESULTS, SPECIMEN SENT FOR CULTURE (A) NEGATIVE Final    Comment: RESULT CALLED TO, READ BACK BY AND VERIFIED WITH: BLACKWELL, S AT 1725 ON 07/25/17 BY INGLEE        The GeneXpert MRSA Assay (FDA approved for NASAL specimens only), is one component of a comprehensive MRSA colonization surveillance program. It is not intended to diagnose MRSA infection nor to guide or monitor treatment for MRSA infections.   MRSA culture     Status: None   Collection Time: 07/25/17 10:12 AM  Result Value Ref Range Status   Specimen Description NOSE  Final   Special Requests NONE  Final   Culture   Final    NO MRSA DETECTED Performed at [facility], [address], [place], [place] [address]    Report Status 07/27/2017 FINAL  Final  Culture, blood (routine x 2)     Status: None (Preliminary result)   Collection Time: 07/25/17  6:07 PM  Result Value Ref Range Status   Specimen Description BLOOD BLOOD LEFT HAND  Final   Special Requests IN PEDIATRIC  BOTTLE Blood Culture adequate volume  Final   Culture   Final    NO GROWTH 1 DAY Performed at [facility], [address] [place], [place] [address]    Report Status PENDING  Incomplete  Culture, blood (routine x 2)     Status: None (Preliminary result)   Collection Time: 07/25/17  6:07 PM  Result Value Ref Range Status   Specimen Description BLOOD BLOOD RIGHT HAND  Final   Special Requests IN PEDIATRIC BOTTLE Blood Culture adequate volume  Final   Culture   Final    NO GROWTH 1 DAY Performed at [facility], [address], [place], [place] [address]    Report Status PENDING  Incomplete         Radiology Studies: Mr Foot Left Wo Contrast  Result Date: 07/26/2017 CLINICAL DATA:  Open wound on the left heel in a diabetic patient. EXAM: MRI OF THE LEFT HINDFOOT WITHOUT CONTRAST TECHNIQUE: Multiplanar, multisequence MR imaging of the left hindfoot was performed. No intravenous contrast was administered. COMPARISON:  Plain films of the left ankle 07/23/2017. MRI left ankle 12/04/2016. FINDINGS: Bones/Joint/Cartilage A small focus of mild edema is seen in the lateral periphery of the calcaneus posteriorly and inferiorly. Bone marrow signal is otherwise normal. Ligaments Intact. Muscles and Tendons Intact. Soft tissues Intense subcutaneous edema is present about the ankle and dorsum of the foot. No focal fluid collection is identified. IMPRESSION: Small focus of mild marrow edema in the lateral periphery of the calcaneus could be due to osteomyelitis but given low intensity of signal reactive or stress change is favored. Intense subcutaneous edema about the ankle and visualized foot could be due to cellulitis or dependent change. No abscess is identified. Negative for septic joint or tenosynovitis. Electronically Signed   By: Drusilla Kanner M.D.   On: 07/26/2017 17:09        Scheduled Meds: . atorvastatin  20 mg Oral q1800  . cloNIDine  0.2 mg Oral TID  . clopidogrel  75  mg Oral Daily  . DULoxetine  60 mg Oral Daily  . enoxaparin (LOVENOX) injection  0.5 mg/kg Subcutaneous Q24H  . furosemide  40 mg Intravenous Once  . gabapentin  400 mg Oral BID  . insulin aspart  0-20 Units Subcutaneous TID WC  . insulin aspart  0-5 Units Subcutaneous QHS  . insulin detemir  40 Units Subcutaneous QHS  . lamoTRIgine  100 mg Oral QHS  . lisinopril  20 mg Oral Daily  . oxybutynin  5 mg Oral QHS   Continuous Infusions: .  ceFAZolin (ANCEF) IV       LOS: 5 days    Time spent: 32 mins     Eily Louvier Joline Maxcy, MD [facility] Pager [phone]   If 7PM-7AM, please contact night-coverage [email or web address] Password [email or web address] 07/28/2017, 10:30 AM

## 2017-07-28 NOTE — Progress Notes (Signed)
Difficulty threading PICC. PCXR shows PICC projected in IJ heading up. PICC d/c'd. Primary RN Chloe called for order for IR to place PICC in AM.

## 2017-07-28 NOTE — Progress Notes (Signed)
Patient has temp of 101.5.  Tylenol has been administered.  Patient in NAD; other VSS at this time. Dr. Nelson ChimesAmin aware; will continue to monitor patient.

## 2017-07-28 NOTE — Progress Notes (Signed)
Patient still spiking fevers of 101.89F. Will order tylenol. CBC was with diff ordered along with CRP, ESR and ferritin. Suspect will likely be elevated. Will order Upper Ext [place] to rule out any DVT due to swelling (due to multiple difficult sticks). If neg for DVT, she will likely need PICC line. Her LLE appears much better compared to when she presented.  Spoke with Dr Graylon Good from [facility], who will see the patient today. Appreciate her input. Will tailor the Abx per her recs.

## 2017-07-28 NOTE — Progress Notes (Signed)
Assumed care of patient from previous RN.  Agree with previous RN's assessment of patient.  Patient stable at this time.  Will continue to monitor patient.

## 2017-07-29 ENCOUNTER — Inpatient Hospital Stay (HOSPITAL_COMMUNITY): Payer: Medicare Other

## 2017-07-29 ENCOUNTER — Encounter (HOSPITAL_COMMUNITY): Payer: Self-pay | Admitting: Interventional Radiology

## 2017-07-29 DIAGNOSIS — D649 Anemia, unspecified: Secondary | ICD-10-CM

## 2017-07-29 DIAGNOSIS — J189 Pneumonia, unspecified organism: Secondary | ICD-10-CM

## 2017-07-29 HISTORY — PX: IR FLUORO GUIDE CV MIDLINE PICC RIGHT: IMG5212

## 2017-07-29 HISTORY — PX: IR US GUIDE VASC ACCESS RIGHT: IMG2390

## 2017-07-29 LAB — GLUCOSE, CAPILLARY
GLUCOSE-CAPILLARY: 131 mg/dL — AB (ref 65–99)
GLUCOSE-CAPILLARY: 218 mg/dL — AB (ref 65–99)
GLUCOSE-CAPILLARY: 232 mg/dL — AB (ref 65–99)
GLUCOSE-CAPILLARY: 228 mg/dL — AB (ref 65–99)
Glucose-Capillary: 287 mg/dL — ABNORMAL HIGH (ref 65–99)
Glucose-Capillary: 175 mg/dL — ABNORMAL HIGH (ref 65–99)

## 2017-07-29 LAB — CBC WITH DIFFERENTIAL/PLATELET
BASOS ABS: 0 10*3/uL (ref 0.0–0.1)
BASOS PCT: 0 %
EOS ABS: 0.2 10*3/uL (ref 0.0–0.7)
EOS PCT: 1 %
HCT: 23.6 % — ABNORMAL LOW (ref 36.0–46.0)
Hemoglobin: 7.7 g/dL — ABNORMAL LOW (ref 12.0–15.0)
LYMPHS PCT: 10 %
Lymphs Abs: 1.5 10*3/uL (ref 0.7–4.0)
MCH: 29.4 pg (ref 26.0–34.0)
MCHC: 32.6 g/dL (ref 30.0–36.0)
MCV: 90.1 fL (ref 78.0–100.0)
MONO ABS: 1.2 10*3/uL — AB (ref 0.1–1.0)
Monocytes Relative: 8 %
Neutro Abs: 12.4 10*3/uL — ABNORMAL HIGH (ref 1.7–7.7)
Neutrophils Relative %: 81 %
PLATELETS: 329 10*3/uL (ref 150–400)
RBC: 2.62 MIL/uL — AB (ref 3.87–5.11)
RDW: 15.1 % (ref 11.5–15.5)
WBC: 15.4 10*3/uL — ABNORMAL HIGH (ref 4.0–10.5)

## 2017-07-29 LAB — CBC
HEMATOCRIT: 27.7 % — AB (ref 36.0–46.0)
HEMOGLOBIN: 9.2 g/dL — AB (ref 12.0–15.0)
MCH: 29.6 pg (ref 26.0–34.0)
MCHC: 33.2 g/dL (ref 30.0–36.0)
MCV: 89.1 fL (ref 78.0–100.0)
Platelets: 312 10*3/uL (ref 150–400)
RBC: 3.11 MIL/uL — ABNORMAL LOW (ref 3.87–5.11)
RDW: 14.9 % (ref 11.5–15.5)
WBC: 13.1 10*3/uL — AB (ref 4.0–10.5)

## 2017-07-29 LAB — C-REACTIVE PROTEIN: CRP: 21.7 mg/dL — AB (ref <1.0)

## 2017-07-29 LAB — SEDIMENTATION RATE: Sed Rate: 139 mm/hr — ABNORMAL HIGH (ref 0–22)

## 2017-07-29 LAB — FERRITIN: FERRITIN: 153 ng/mL (ref 11–307)

## 2017-07-29 MED ORDER — LIDOCAINE HCL 1 % IJ SOLN
INTRAMUSCULAR | Status: DC | PRN
Start: 2017-07-29 — End: 2017-08-02
  Administered 2017-07-29: 5 mL

## 2017-07-29 MED ORDER — SODIUM CHLORIDE 0.9% FLUSH
10.0000 mL | INTRAVENOUS | Status: DC | PRN
  Administered 2017-07-31 (×2): 10 mL
  Administered 2017-07-31: 20 mL
  Filled 2017-07-29 (×3): qty 40

## 2017-07-29 MED ORDER — FUROSEMIDE 40 MG PO TABS
40.0000 mg | ORAL_TABLET | Freq: Once | ORAL | Status: AC
Start: 2017-07-29 — End: 2017-07-29
  Administered 2017-07-29: 40 mg via ORAL
  Filled 2017-07-29: qty 1

## 2017-07-29 MED ORDER — FUROSEMIDE 10 MG/ML IJ SOLN
40.0000 mg | Freq: Two times a day (BID) | INTRAMUSCULAR | Status: AC
  Administered 2017-07-29 – 2017-07-31 (×4): 40 mg via INTRAVENOUS
  Filled 2017-07-29 (×4): qty 4

## 2017-07-29 MED ORDER — AMLODIPINE BESYLATE 5 MG PO TABS
5.0000 mg | ORAL_TABLET | Freq: Every day | ORAL | Status: DC
Start: 2017-07-29 — End: 2017-08-02
  Administered 2017-07-29 – 2017-08-02 (×5): 5 mg via ORAL
  Filled 2017-07-29 (×4): qty 1

## 2017-07-29 MED ORDER — FUROSEMIDE 10 MG/ML IJ SOLN: 40.0000 mg | Freq: Every day | INTRAMUSCULAR | Status: DC

## 2017-07-29 MED ORDER — LIDOCAINE HCL 1 % IJ SOLN
INTRAMUSCULAR | Status: AC
  Filled 2017-07-29: qty 20

## 2017-07-29 NOTE — Procedures (Signed)
Successful placement of dual lumen PICC line to right basilic vein. Length 40 cm Tip at lower SVC/RA No complications Ready for use.  Brayton ElBRUNING, Trachelle Low PA-C 12:50 PM

## 2017-07-29 NOTE — Progress Notes (Addendum)
CSW continuing to follow and will assist patient with [facility] planning [facility] when patient is medically stable.   11:14am CSW provided update to patient's SNF. Staff reported that they have spoken with patient. CSW agreed to contact SNF with updates regarding discharge.   Celso SickleKimberly Tawney Vanorman, [place] Clinical Social Worker [facility] Cell#: [phone]

## 2017-07-29 NOTE — Progress Notes (Signed)
PROGRESS NOTE    Laurie [name]  ZOX:[id number] DOB: March 15, 1946 DOA: 07/23/2017 PCP: Rometta EmeryGarba, Mohammad L, MD   Brief Narrative:   71 year old female with past medical history of chronic diastolic heart failure, diabetes, status post right below-knee amputation and peripheral vascular disease came to the ER with complaints of generalized body aches. In the ER she was noted to be septic with elevated lactic acid without any obvious source of infection. She was started on broad-spectrum antibiotics and admitted for further management. Upon admission she was found to have left lower extremity cellulitis with possible concerns of infected left lower extremity diabetic/pressure wound. X-ray of the foot was negative and MRI showed questionable osteomyelitis but likely stress related and cellulitic changes. Left proximity Dopplers were negative. Laboratory somewhat is improving Zosyn but she continued to spike fever therefore infectious disease was consulted.  Assessment & Plan:   Principal Problem:   Sepsis (HCC) Active Problems:   Diabetes mellitus type 2 with peripheral artery disease (HCC)   Anxiety state   Essential hypertension   Chronic diastolic heart failure, grade 2   Dyslipidemia   Diabetic foot ulcer (HCC)   Obesity (BMI 30-39.9)   Cellulitis versus pneumonia versus infected diabetic wound Multifocal pneumonia on chest x-ray  -Unknown source of exact etiology. On and off low-grade fever -Blood cultures 8/2 -no growth to date Blood culture 8/4 -no growth today -UA is negative, chest x-ray 8/2 shows mild cardiomegaly with some vascular congestion without any signs of pneumonia. Chest x-ray on 8/3 shows signs of multifocal pneumonia with possible fluid. -Tylenol when necessary. Broad-spectrum antibiotics vancomycin and Zosyn. Infectious disease is following therefore antibiotics per ID. -LE Doppers is neg for DVT - hep drip discontinued  -IVF on hold due to abnormal BS.  -CBC with  differential checked this morning- stable leukocytosis, eosinophil -1  Mild Shortness of Breath with Nonproductive cough Multifocal pneumonia versus some fluid overload -Supplemental oxygen. Continue IV antibiotics. PRN nebulizer -Ordered Lasix 40 mg IV bid x 4 doses. Will get an oral dose this morning due to IV access issues. She is to get PICC line later today by IR. Monitor urine output. She is +10L since admission.  -If her respirations symptoms does not improve with antibiotics and Lasix she may need advanced imaging such a CT of the chest. Low suspicion but due to drop in hemoglobin there could be some hemorrhage? She was on heparin drip for a brief period 3 days ago for suspected DVT.  Diabetic foot ulcer; stable -X-ray the foot is negative for any signs of osteomyelitis -MRI of the foot 8/5- questionable osteomyelitis versus reactive or stress change, some cellulitic changes  Anemia; unknown etiology - acute on Chronic but hemoglobin dropped from 10.4 to 7.7 today in the last 3 days. I will recheck it again later today. Some blood loss during PICC line attempt? -Check Hemoccult -Stop Lovenox and place her on SCDs  Left hip pain; chronic -She states this is chronic and from her bursitis. CT A/P done at the time of the admission was neg for acute pathology in the hip arae.  -pain medications  1.4 cm liver lesion- metastatic disease versus atypical hemangioma versus small hepatic abscess -Patient has never had colonoscopy. She needs to follow this up outpatient. She will need colonoscopy once her acute sickness has been taken care of. -MRI of the abdomen outpatient  Diabetes type 2 -Accu-Cheks and sliding scale. Hemoglobin A1c 11.5 -Lantus 40 units.  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 According to previous vascular note from December 2017 determined patient is a poor candidate for  revascularization.  Essential hypertension, elevated -Add Norvasc 5 mg daily -On clonidine 0.2 mg 3 times daily, lisinopril 20 mg daily -On IV lasix, but transition to PO torsemide (home regimen)  Hyperlipidemia -On statin  DVT prophylaxis: SCDs Code Status: Full code Family Communication: None at bedside. Disposition Plan: To be determined  Consultants:   Infection disease  Procedures:   None  Antimicrobials:   Vanc 8/2 > 8/7  Zosyn 8/2 > 8/7   Ancef 8/7 >>>   Subjective: Patient continues report of chronic hip pain and some cough this morning.  Objective: Vitals:   07/28/17 2245 07/29/17 0649 07/29/17 0759 07/29/17 0945  BP:  (!) 188/89  (!) 179/69  Pulse:  97  (!) 102  Resp:  18  18  Temp:  98.3 F (36.8 C)    TempSrc:  Oral    SpO2: 92% 93% 93%   Weight:      Height:        Intake/Output Summary (Last 24 hours) at 07/29/17 1235 Last data filed at 07/29/17 0500  Gross per 24 hour  Intake              900 ml  Output             1800 ml  Net             -900 ml   Filed Weights   07/23/17 1505  Weight: 100.7 kg (222 lb 0.1 oz)    Examination:  General exam: Appears calm and comfortable But some discomfort due to hip Respiratory system: b/l crackles mid way up her lungs.  Cardiovascular system: S1 & S2 heard, RRR. No JVD, murmurs, rubs, gallops or clicks. No pedal edema. Gastrointestinal system: Abdomen is nondistended, soft and nontender. No organomegaly or masses felt. Normal bowel sounds heard. Central nervous system: Alert and oriented. No focal neurological deficits. Extremities: Left lower extremity swelling and erythema has much improved. No longer tender to palpation. Right-sided BKA noted.  Skin: Left lower extremity erythema and swelling with some warmth but no obvious fluctuation, improved Psychiatry: Judgement and insight appear normal. Mood & affect appropriate.     Data Reviewed:   CBC:  Recent Labs Lab 07/23/17 1049  07/24/17 0125 07/25/17 0515 07/26/17 0510 07/29/17 0519  WBC 10.3 18.8* 19.6* 13.9* 15.4*  NEUTROABS 9.4*  --   --   --  12.4*  HGB 11.1* 10.3* 10.4* 10.4* 7.7*  HCT 33.9* 31.1* 31.4* 29.8* 23.6*  MCV 91.9 91.7 91.8 88.7 90.1  PLT 272 247 243 224 329   Basic Metabolic Panel:  Recent Labs Lab 07/25/17 0515 07/26/17 0510 07/27/17 1003 07/27/17 2258 07/28/17 0814  NA 140 141 139 140 141  K 3.0* 3.3* 4.1 3.8 4.2  CL 110 110 111 112* 114*  CO2 21* 21* 20* 19* 17*  GLUCOSE 163* 121* 109* 103* 150*  BUN 14 13 13 12 11   CREATININE 1.01* 0.92 0.97 0.90 0.91  CALCIUM 7.5* 7.6* 7.7* 7.8* 7.8*  MG 1.4* 1.7 1.9 1.9 1.9   GFR: Estimated Creatinine Clearance: 72.1 mL/min (by C-G formula based on SCr of 0.91 mg/dL). Liver Function Tests:  Recent Labs Lab 07/23/17 1049 07/25/17 0515 07/26/17 0510 07/27/17 1003 07/28/17 0814  AST 22 26 18 24 19   ALT 15 20 18 17 15   ALKPHOS 92 76 73 76 77  BILITOT 0.4 0.5 0.3 0.5 0.5  PROT 7.0 5.9* 6.0* 6.3* 6.5  ALBUMIN 3.3* 2.3* 2.2* 2.3* 2.0*   No results for input(s): LIPASE, AMYLASE in the last 168 hours. No results for input(s): AMMONIA in the last 168 hours. Coagulation Profile:  Recent Labs Lab 07/23/17 2225  INR 1.11   Cardiac Enzymes: No results for input(s): CKTOTAL, CKMB, CKMBINDEX, TROPONINI in the last 168 hours. BNP (last 3 results) No results for input(s): PROBNP in the last 8760 hours. HbA1C: No results for input(s): HGBA1C in the last 72 hours. CBG:  Recent Labs Lab 07/28/17 1708 07/28/17 2157 07/29/17 0147 07/29/17 0836 07/29/17 1201  GLUCAP 287* 71 131* 175* 218*   Lipid Profile: No results for input(s): CHOL, HDL, LDLCALC, TRIG, CHOLHDL, LDLDIRECT in the last 72 hours. Thyroid Function Tests: No results for input(s): TSH, T4TOTAL, FREET4, T3FREE, THYROIDAB in the last 72 hours. Anemia Panel:  Recent Labs  07/29/17 0519  FERRITIN 153   Sepsis Labs:  Recent Labs Lab 07/23/17 1048 07/23/17 1049  07/23/17 2225 07/24/17 0125 07/25/17 0515 07/27/17 1003  PROCALCITON  --  2.36  --   --  16.58 5.70  LATICACIDVEN 2.17*  --  2.1* 2.1*  --   --     Recent Results (from the past 240 hour(s))  Blood Culture (routine x 2)     Status: None   Collection Time: 07/23/17 10:37 AM  Result Value Ref Range Status   Specimen Description BLOOD LEFT ANTECUBITAL  Final   Special Requests   Final    BOTTLES DRAWN AEROBIC AND ANAEROBIC Blood Culture adequate volume   Culture   Final    NO GROWTH 5 DAYS Performed at [facility], [address], [place], [place] [address]    Report Status 07/28/2017 FINAL  Final  Urine culture     Status: None   Collection Time: 07/23/17 11:51 AM  Result Value Ref Range Status   Specimen Description URINE, CATHETERIZED  Final   Special Requests NONE  Final   Culture   Final    NO GROWTH Performed at [facility], [address] [place], [place] [address]    Report Status 07/24/2017 FINAL  Final  Blood Culture (routine x 2)     Status: None   Collection Time: 07/23/17  4:39 PM  Result Value Ref Range Status   Specimen Description BLOOD BLOOD RIGHT HAND  Final   Special Requests IN PEDIATRIC BOTTLE Blood Culture adequate volume  Final   Culture   Final    NO GROWTH 5 DAYS Performed at [facility], [address] [place], [place] [address]    Report Status 07/28/2017 FINAL  Final  MRSA PCR Screening     Status: Abnormal   Collection Time: 07/24/17 11:47 PM  Result Value Ref Range Status   MRSA by PCR INVALID RESULTS, SPECIMEN SENT FOR CULTURE (A) NEGATIVE Final    Comment:        The GeneXpert MRSA Assay (FDA approved for NASAL specimens only), is one component of a comprehensive MRSA colonization surveillance program. It is not intended to diagnose MRSA infection nor to guide or monitor treatment for MRSA infections. RESULT CALLED TO, READ BACK BY AND VERIFIED WITH: D BROWN AT 0017 ON 08.04.2018 BY NBROOKS   MRSA PCR  Screening     Status: Abnormal   Collection Time: 07/25/17  9:21 AM  Result Value Ref Range Status   MRSA by PCR INVALID  RESULTS, SPECIMEN SENT FOR CULTURE (A) NEGATIVE Final    Comment: RESULT CALLED TO, READ BACK BY AND VERIFIED WITH: BLACKWELL, S AT 1725 ON 07/25/17 BY INGLEE        The GeneXpert MRSA Assay (FDA approved for NASAL specimens only), is one component of a comprehensive MRSA colonization surveillance program. It is not intended to diagnose MRSA infection nor to guide or monitor treatment for MRSA infections.   MRSA culture     Status: None   Collection Time: 07/25/17 10:12 AM  Result Value Ref Range Status   Specimen Description NOSE  Final   Special Requests NONE  Final   Culture   Final    NO MRSA DETECTED Performed at [facility], [address], [place], [place] [address]    Report Status 07/27/2017 FINAL  Final  Culture, blood (routine x 2)     Status: None (Preliminary result)   Collection Time: 07/25/17  6:07 PM  Result Value Ref Range Status   Specimen Description BLOOD BLOOD LEFT HAND  Final   Special Requests IN PEDIATRIC BOTTLE Blood Culture adequate volume  Final   Culture   Final    NO GROWTH 3 DAYS Performed at [facility], [address], [place], [place] [address]    Report Status PENDING  Incomplete  Culture, blood (routine x 2)     Status: None (Preliminary result)   Collection Time: 07/25/17  6:07 PM  Result Value Ref Range Status   Specimen Description BLOOD BLOOD RIGHT HAND  Final   Special Requests IN PEDIATRIC BOTTLE Blood Culture adequate volume  Final   Culture   Final    NO GROWTH 3 DAYS Performed at [facility], [address], [place], [place] [address]    Report Status PENDING  Incomplete         Radiology Studies: Dg Chest Port 1 View  Result Date: 07/28/2017 CLINICAL DATA:  PICC line positioning. EXAM: PORTABLE CHEST 1 VIEW COMPARISON:  07/23/2017 FINDINGS: Right upper extremity PICC line is  been placed. This ascends in the neck within the right internal jugular vein. The tip is not visualized. Lungs show significant worsening in aeration with diffuse airspace disease noted in the right upper and lower lung zones and airspace disease also in the left lower lung. Findings are concerning for multifocal pneumonia. There may also be a component of interstitial edema. No significant pleural fluid identified. IMPRESSION: 1. Right-sided PICC line ascends within the right internal jugular vein. 2. Significant worsening of pulmonary aeration with now significant airspace disease noted in the right upper and lower lungs as well as the left lower lung. Findings are concerning for multifocal pneumonia. There may also be a component of interstitial edema. Electronically Signed   By: Irish Lack M.D.   On: 07/28/2017 22:38        Scheduled Meds: . lidocaine      . atorvastatin  20 mg Oral q1800  . cloNIDine  0.2 mg Oral TID  . clopidogrel  75 mg Oral Daily  . DULoxetine  60 mg Oral Daily  . enoxaparin (LOVENOX) injection  0.5 mg/kg Subcutaneous Q24H  . furosemide  40 mg Intravenous Daily  . gabapentin  400 mg Oral BID  . insulin aspart  0-20 Units Subcutaneous TID WC  . insulin aspart  0-5 Units Subcutaneous QHS  . insulin detemir  40 Units Subcutaneous QHS  . ipratropium-albuterol  3 mL Nebulization TID  . lamoTRIgine  100 mg Oral QHS  . lisinopril  20 mg Oral Daily  . oxybutynin  5 mg Oral QHS   Continuous Infusions: . vancomycin Stopped (07/29/17 0726)     LOS: 6 days    Time spent: 31 mins     Nayomi Tabron Joline Maxcy, MD [facility] Pager [phone]   If 7PM-7AM, please contact night-coverage [email or web address] Password [facility] 07/29/2017, 12:35 PM

## 2017-07-30 ENCOUNTER — Inpatient Hospital Stay (HOSPITAL_COMMUNITY): Payer: Medicare Other

## 2017-07-30 DIAGNOSIS — L97425 Non-pressure chronic ulcer of left heel and midfoot with muscle involvement without evidence of necrosis: Secondary | ICD-10-CM

## 2017-07-30 DIAGNOSIS — J181 Lobar pneumonia, unspecified organism: Secondary | ICD-10-CM

## 2017-07-30 DIAGNOSIS — E11621 Type 2 diabetes mellitus with foot ulcer: Secondary | ICD-10-CM

## 2017-07-30 DIAGNOSIS — J189 Pneumonia, unspecified organism: Secondary | ICD-10-CM

## 2017-07-30 DIAGNOSIS — E1151 Type 2 diabetes mellitus with diabetic peripheral angiopathy without gangrene: Secondary | ICD-10-CM

## 2017-07-30 LAB — CREATININE, SERUM: Creatinine, Ser: 0.79 mg/dL (ref 0.44–1.00)

## 2017-07-30 LAB — GLUCOSE, CAPILLARY
GLUCOSE-CAPILLARY: 144 mg/dL — AB (ref 65–99)
GLUCOSE-CAPILLARY: 181 mg/dL — AB (ref 65–99)
GLUCOSE-CAPILLARY: 195 mg/dL — AB (ref 65–99)
Glucose-Capillary: 180 mg/dL — ABNORMAL HIGH (ref 65–99)

## 2017-07-30 MED ORDER — BISACODYL 5 MG PO TBEC
10.0000 mg | DELAYED_RELEASE_TABLET | Freq: Once | ORAL | Status: DC
  Filled 2017-07-30 (×2): qty 2

## 2017-07-30 MED ORDER — POLYETHYLENE GLYCOL 3350 17 G PO PACK
34.0000 g | PACK | Freq: Two times a day (BID) | ORAL | Status: AC
  Administered 2017-07-30: 34 g via ORAL
  Administered 2017-07-30: 17 g via ORAL
  Filled 2017-07-30 (×2): qty 2

## 2017-07-30 MED ORDER — ENOXAPARIN SODIUM 60 MG/0.6ML ~~LOC~~ SOLN
0.5000 mg/kg | SUBCUTANEOUS | Status: DC
  Administered 2017-07-30 – 2017-08-01 (×3): 50 mg via SUBCUTANEOUS
  Filled 2017-07-30 (×3): qty 0.6

## 2017-07-30 NOTE — Progress Notes (Signed)
Assumed pt care. Agree with previous RN assessment. Pt with no complaints. Performed dressing change to left heel. Pt tolerated well with no c/o pain.

## 2017-07-30 NOTE — Progress Notes (Signed)
[facility]  [facility] will follow pt with ID team to support DC home if IV ABX needed at DC.  If patient discharges after hours, please call [phone].   Laurie [name] 07/30/2017, 2:28 PM

## 2017-07-30 NOTE — Progress Notes (Signed)
PROGRESS NOTE    Laurie [name]  YIR:[id number] DOB: 1946-07-07 DOA: 07/23/2017 PCP: Elwyn Reach, MD   Brief Narrative:   71 year old female with past medical history of chronic diastolic heart failure, diabetes, status post right below-knee amputation and peripheral vascular disease came to the ER with complaints of generalized body aches. In the ER she was noted to be septic with elevated lactic acid without any obvious source of infection. She was started on broad-spectrum antibiotics and admitted for further management. Upon admission she was found to have left lower extremity cellulitis with possible concerns of infected left lower extremity diabetic/pressure wound. X-ray of the foot was negative and MRI showed questionable osteomyelitis but likely stress related and cellulitic changes. Left proximity Dopplers were negative. Laboratory somewhat is improving Zosyn but she continued to spike fever therefore infectious disease was consulted.  Assessment & Plan:   Principal Problem:   Sepsis ([place]) Active Problems:   Diabetes mellitus type 2 with peripheral artery disease (HCC)   Anxiety state   Essential hypertension   Chronic diastolic heart failure, grade 2   Dyslipidemia   Diabetic foot ulcer (HCC)   Obesity (BMI 30-39.9)   Cellulitis /infected diabetic wound -Blood cultures 8/2 -no growth to date Blood culture 8/4 -no growth today - Patient was empirically on vancomycin and Zosyn during hospital stay, ID input greatly appreciated, continue with vancomycin per ID -LE Doppers is neg for DVT - hep drip discontinued   Community acquired pneumonia - CT done on admission, significant for left basal opacity concerning for pneumonia, she reports cough, initially productive, currently significantly improved, treated with vancomycin and Zosyn, encouraged to use flutter valve, incentive spirometry, and will start on Mucinex - Patient with evidence of volume overload over last 48  hours, but currently significantly improved with IV diuresis, will continue with Lasix last dose today, reassess volume status tomorrow.  Diabetic foot ulcer; stable -X-ray the foot is negative for any signs of osteomyelitis -MRI of the foot 8/5- questionable osteomyelitis versus reactive or stress change, some cellulitic changes  Anemia; unknown etiology - acute on Chronic but hemoglobin dropped from 10.4 to 7.7 today in the last 3 days. I will recheck it again later today. Some blood loss during PICC line attempt? -Check Hemoccult -Stop Lovenox and place her on SCDs  Left hip pain; chronic -She states this is chronic and from her bursitis. CT A/P done at the time of the admission was neg for acute pathology in the hip arae.  -pain medications  1.4 cm liver lesion- metastatic disease versus atypical hemangioma versus small hepatic abscess -Patient has never had colonoscopy. She needs to follow this up outpatient. She will need colonoscopy once her acute sickness has been taken care of. -MRI of the abdomen outpatient  Diabetes type 2 -Accu-Cheks and sliding scale. Hemoglobin A1c 11.5 -Lantus 40 units.  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 According to previous vascular note from December 2017 determined patient is a poor candidate for revascularization.  Essential hypertension, elevated -Add Norvasc 5 mg daily -On clonidine 0.2 mg 3 times daily, lisinopril 20 mg daily -On IV lasix, but transition to PO torsemide (home regimen)  Hyperlipidemia -On statin  DVT prophylaxis: SCDs Code Status: Full code Family Communication: None at bedside. Disposition Plan: To be determined  Consultants:   Infection disease  Procedures:   None  Antimicrobials:   Vanc 8/2 > 8/7  Zosyn 8/2 > 8/7  Ancef 8/7 >>>   Subjective: Patient reports chronic pain, reports some cough, reports it is  nonproductive, denies any fever or chills  Objective: Vitals:   07/29/17 2109 07/29/17 2129 07/30/17 0527 07/30/17 0821  BP:  (!) 147/52 (!) 155/57   Pulse:  91 87   Resp:  20 18   Temp:  99.1 F (37.3 C) 98.6 F (37 C)   TempSrc:  Oral Oral   SpO2: 94% 95% 95% 91%  Weight:      Height:        Intake/Output Summary (Last 24 hours) at 07/30/17 1214 Last data filed at 07/30/17 0076  Gross per 24 hour  Intake              930 ml  Output             2900 ml  Net            -1970 ml   Filed Weights   07/23/17 1505  Weight: 100.7 kg (222 lb 0.1 oz)    Examination:  General exam: Awake alert 3, pleasant female, sitting propped up in bed in no apparent distress  Respiratory system: Bibasilar crackles, no respiratory distress, not using accessory muscles . Cardiovascular system: S1 & S2 heard, RRR. No JVD, murmurs, rubs, gallops or clicks. No pedal edema. Gastrointestinal system: Abdomen soft, nontender, nondistended, bowel sounds present Central nervous system: No focal neurological deficits. Extremities: Left lower extremity swelling and erythema has much improved. No longer tender to palpation. Right-sided BKA noted.  Skin: Left lower extremity erythema and swelling with some warmth but no obvious fluctuation, improved    Data Reviewed:   CBC:  Recent Labs Lab 07/24/17 0125 07/25/17 0515 07/26/17 0510 07/29/17 0519 07/29/17 1818  WBC 18.8* 19.6* 13.9* 15.4* 13.1*  NEUTROABS  --   --   --  12.4*  --   HGB 10.3* 10.4* 10.4* 7.7* 9.2*  HCT 31.1* 31.4* 29.8* 23.6* 27.7*  MCV 91.7 91.8 88.7 90.1 89.1  PLT 247 243 224 329 226   Basic Metabolic Panel:  Recent Labs Lab 07/25/17 0515 07/26/17 0510 07/27/17 1003 07/27/17 2258 07/28/17 0814 07/30/17 0827  NA 140 141 139 140 141  --   K 3.0* 3.3* 4.1 3.8 4.2  --   CL 110 110 111 112* 114*  --   CO2 21* 21* 20* 19* 17*  --   GLUCOSE 163* 121* 109* 103* 150*  --   BUN 14 13 13 12 11   --   CREATININE 1.01* 0.92  0.97 0.90 0.91 0.79  CALCIUM 7.5* 7.6* 7.7* 7.8* 7.8*  --   MG 1.4* 1.7 1.9 1.9 1.9  --    GFR: Estimated Creatinine Clearance: 82 mL/min (by C-G formula based on SCr of 0.79 mg/dL). Liver Function Tests:  Recent Labs Lab 07/25/17 0515 07/26/17 0510 07/27/17 1003 07/28/17 0814  AST 26 18 24 19   ALT 20 18 17 15   ALKPHOS 76 73 76 77  BILITOT 0.5 0.3 0.5 0.5  PROT 5.9* 6.0* 6.3* 6.5  ALBUMIN 2.3* 2.2* 2.3* 2.0*   No results for input(s): LIPASE, AMYLASE in the last 168 hours. No results for input(s): AMMONIA in the last 168 hours. Coagulation Profile:  Recent Labs Lab 07/23/17 2225  INR 1.11   Cardiac Enzymes: No results for input(s): CKTOTAL, CKMB, CKMBINDEX, TROPONINI in the last 168 hours. BNP (last 3 results) No results for input(s): PROBNP in the last 8760 hours. HbA1C: No results for input(s): HGBA1C  in the last 72 hours. CBG:  Recent Labs Lab 07/29/17 1201 07/29/17 1638 07/29/17 2126 07/30/17 0732 07/30/17 1150  GLUCAP 218* 232* 228* 144* 180*   Lipid Profile: No results for input(s): CHOL, HDL, LDLCALC, TRIG, CHOLHDL, LDLDIRECT in the last 72 hours. Thyroid Function Tests: No results for input(s): TSH, T4TOTAL, FREET4, T3FREE, THYROIDAB in the last 72 hours. Anemia Panel:  Recent Labs  07/29/17 0519  FERRITIN 153   Sepsis Labs:  Recent Labs Lab 07/23/17 2225 07/24/17 0125 07/25/17 0515 07/27/17 1003  PROCALCITON  --   --  16.58 5.70  LATICACIDVEN 2.1* 2.1*  --   --     Recent Results (from the past 240 hour(s))  Blood Culture (routine x 2)     Status: None   Collection Time: 07/23/17 10:37 AM  Result Value Ref Range Status   Specimen Description BLOOD LEFT ANTECUBITAL  Final   Special Requests   Final    BOTTLES DRAWN AEROBIC AND ANAEROBIC Blood Culture adequate volume   Culture   Final    NO GROWTH 5 DAYS Performed at [facility], [address], [place], [place] [address]    Report Status 07/28/2017 FINAL  Final  Urine  culture     Status: None   Collection Time: 07/23/17 11:51 AM  Result Value Ref Range Status   Specimen Description URINE, CATHETERIZED  Final   Special Requests NONE  Final   Culture   Final    NO GROWTH Performed at [facility], [place] [address] [place], [place] [address]    Report Status 07/24/2017 FINAL  Final  Blood Culture (routine x 2)     Status: None   Collection Time: 07/23/17  4:39 PM  Result Value Ref Range Status   Specimen Description BLOOD BLOOD RIGHT HAND  Final   Special Requests IN PEDIATRIC BOTTLE Blood Culture adequate volume  Final   Culture   Final    NO GROWTH 5 DAYS Performed at [facility], [place] [address], [place],  [address]    Report Status 07/28/2017 FINAL  Final  MRSA PCR Screening     Status: Abnormal   Collection Time: 07/24/17 11:47 PM  Result Value Ref Range Status   MRSA by PCR INVALID RESULTS, SPECIMEN SENT FOR CULTURE (A) NEGATIVE Final    Comment:        The GeneXpert MRSA Assay (FDA approved for NASAL specimens only), is one component of a comprehensive MRSA colonization surveillance program. It is not intended to diagnose MRSA infection nor to guide or monitor treatment for MRSA infections. RESULT CALLED TO, READ BACK BY AND VERIFIED WITH: D BROWN AT 0017 ON 08.04.2018 BY NBROOKS   MRSA PCR Screening     Status: Abnormal   Collection Time: 07/25/17  9:21 AM  Result Value Ref Range Status   MRSA by PCR INVALID RESULTS, SPECIMEN SENT FOR CULTURE (A) NEGATIVE Final    Comment: RESULT CALLED TO, READ BACK BY AND VERIFIED WITH: [place], S AT [place] ON 07/25/17 BY [place]        The GeneXpert MRSA Assay (FDA approved for NASAL specimens only), is one component of a comprehensive MRSA colonization surveillance program. It is not intended to diagnose MRSA infection nor to guide or monitor treatment for MRSA infections.   MRSA culture     Status: None   Collection Time: 07/25/17 10:12 AM  Result Value Ref Range  Status   Specimen Description NOSE  Final  Special Requests NONE  Final   Culture   Final    NO MRSA DETECTED Performed at [facility], [place] [address] [place], [place] [address]    Report Status 07/27/2017 FINAL  Final  Culture, blood (routine x 2)     Status: None (Preliminary result)   Collection Time: 07/25/17  6:07 PM  Result Value Ref Range Status   Specimen Description BLOOD BLOOD LEFT HAND  Final   Special Requests IN PEDIATRIC BOTTLE Blood Culture adequate volume  Final   Culture   Final    NO GROWTH 3 DAYS Performed at [facility], [place] [address] [place], [place] [address]    Report Status PENDING  Incomplete  Culture, blood (routine x 2)     Status: None (Preliminary result)   Collection Time: 07/25/17  6:07 PM  Result Value Ref Range Status   Specimen Description BLOOD BLOOD RIGHT HAND  Final   Special Requests IN PEDIATRIC BOTTLE Blood Culture adequate volume  Final   Culture   Final    NO GROWTH 3 DAYS Performed at [facility], [place] [address], [place], [place] [address]    Report Status PENDING  Incomplete         Radiology Studies: Ir US Guide Vasc Access Right  Result Date: 07/29/2017 INDICATION: Sepsis. Request PICC line placement for prolonged IV antibiotic therapies. EXAM: ULTRASOUND AND FLUOROSCOPIC GUIDED PICC LINE INSERTION MEDICATIONS: None. CONTRAST:  None FLUOROSCOPY TIME:  36 seconds seconds COMPLICATIONS: None immediate. TECHNIQUE: The procedure, risks, benefits, and alternatives were explained to the patient and informed written consent was obtained. A timeout was performed prior to the initiation of the procedure. The right upper extremity was prepped with chlorhexidine in a sterile fashion, and a sterile drape was applied covering the operative field. Maximum barrier sterile technique with sterile gowns and gloves were used for the procedure. A timeout was performed prior to the initiation of the procedure. Local anesthesia was  provided with 1% lidocaine. Under direct ultrasound guidance, the basilic vein was accessed with a micropuncture kit after the overlying soft tissues were anesthetized with 1% lidocaine. After the overlying soft tissues were anesthetized, a small venotomy incision was created and a micropuncture kit was utilized to access the right basilic vein. Real-time ultrasound guidance was utilized for vascular access including the acquisition of a permanent ultrasound image documenting patency of the accessed vessel. A guidewire was advanced to the level of the superior caval-atrial junction for measurement purposes and the PICC line was cut to length. A peel-away sheath was placed and a 40 cm, 5 [place], dual lumen was inserted to level of the superior caval-atrial junction. A post procedure spot fluoroscopic was obtained. The catheter easily aspirated and flushed and was sutured in place. A dressing was placed. The patient tolerated the procedure well without immediate post procedural complication. FINDINGS: After catheter placement, the tip lies within the superior cavoatrial junction. The catheter aspirates and flushes normally and is ready for immediate use. IMPRESSION: Successful ultrasound and fluoroscopic guided placement of a right basilic vein approach, 40 cm, 5 French, dual lumen PICC with tip at the superior caval-atrial junction. The PICC line is ready for immediate use. Read by: Ascencion Dike PA-C Electronically Signed   By: Corrie Mckusick D.O.   On: 07/29/2017 12:54   Dg Chest Port 1 View  Result Date: 07/30/2017 CLINICAL DATA:  Patient admitted 07/23/2017 with sepsis. Shortness of breath today. EXAM: PORTABLE CHEST 1 VIEW  COMPARISON:  Single-view of the chest 07/28/2017 and PA and lateral chest 07/23/2017. FINDINGS: Bilateral airspace disease persists but aeration is improved since the most recent examination. Heart size is upper normal. There are likely small bilateral pleural effusions. No pneumothorax. The  patient has a new right PICC with the tip now in the mid to lower superior vena cava. IMPRESSION: Bilateral airspace disease compatible with multifocal pneumonia persists but has improved. Right PICC in good position. No new abnormality. Electronically Signed   By: Inge Rise M.D.   On: 07/30/2017 07:18   Dg Chest Port 1 View  Result Date: 07/28/2017 CLINICAL DATA:  PICC line positioning. EXAM: PORTABLE CHEST 1 VIEW COMPARISON:  07/23/2017 FINDINGS: Right upper extremity PICC line is been placed. This ascends in the neck within the right internal jugular vein. The tip is not visualized. Lungs show significant worsening in aeration with diffuse airspace disease noted in the right upper and lower lung zones and airspace disease also in the left lower lung. Findings are concerning for multifocal pneumonia. There may also be a component of interstitial edema. No significant pleural fluid identified. IMPRESSION: 1. Right-sided PICC line ascends within the right internal jugular vein. 2. Significant worsening of pulmonary aeration with now significant airspace disease noted in the right upper and lower lungs as well as the left lower lung. Findings are concerning for multifocal pneumonia. There may also be a component of interstitial edema. Electronically Signed   By: Aletta Edouard M.D.   On: 07/28/2017 22:38   Ir Fluoro Guide Cv Midline Picc Right  Result Date: 07/29/2017 INDICATION: Sepsis. Request PICC line placement for prolonged IV antibiotic therapies. EXAM: ULTRASOUND AND FLUOROSCOPIC GUIDED PICC LINE INSERTION MEDICATIONS: None. CONTRAST:  None FLUOROSCOPY TIME:  36 seconds seconds COMPLICATIONS: None immediate. TECHNIQUE: The procedure, risks, benefits, and alternatives were explained to the patient and informed written consent was obtained. A timeout was performed prior to the initiation of the procedure. The right upper extremity was prepped with chlorhexidine in a sterile fashion, and a sterile  drape was applied covering the operative field. Maximum barrier sterile technique with sterile gowns and gloves were used for the procedure. A timeout was performed prior to the initiation of the procedure. Local anesthesia was provided with 1% lidocaine. Under direct ultrasound guidance, the basilic vein was accessed with a micropuncture kit after the overlying soft tissues were anesthetized with 1% lidocaine. After the overlying soft tissues were anesthetized, a small venotomy incision was created and a micropuncture kit was utilized to access the right basilic vein. Real-time ultrasound guidance was utilized for vascular access including the acquisition of a permanent ultrasound image documenting patency of the accessed vessel. A guidewire was advanced to the level of the superior caval-atrial junction for measurement purposes and the PICC line was cut to length. A peel-away sheath was placed and a 40 cm, 5 [place], dual lumen was inserted to level of the superior caval-atrial junction. A post procedure spot fluoroscopic was obtained. The catheter easily aspirated and flushed and was sutured in place. A dressing was placed. The patient tolerated the procedure well without immediate post procedural complication. FINDINGS: After catheter placement, the tip lies within the superior cavoatrial junction. The catheter aspirates and flushes normally and is ready for immediate use. IMPRESSION: Successful ultrasound and fluoroscopic guided placement of a right basilic vein approach, 40 cm, 5 French, dual lumen PICC with tip at the superior caval-atrial junction. The PICC line is ready for immediate use. Read by: Lennette Bihari  [place] PA-C Electronically Signed   By: Corrie Mckusick D.O.   On: 07/29/2017 12:54        Scheduled Meds: . amLODipine  5 mg Oral Daily  . atorvastatin  20 mg Oral q1800  . bisacodyl  10 mg Oral Once  . cloNIDine  0.2 mg Oral TID  . clopidogrel  75 mg Oral Daily  . DULoxetine  60 mg Oral Daily    . enoxaparin (LOVENOX) injection  0.5 mg/kg Subcutaneous Q24H  . furosemide  40 mg Intravenous BID  . gabapentin  400 mg Oral BID  . insulin aspart  0-20 Units Subcutaneous TID WC  . insulin aspart  0-5 Units Subcutaneous QHS  . insulin detemir  40 Units Subcutaneous QHS  . ipratropium-albuterol  3 mL Nebulization TID  . lamoTRIgine  100 mg Oral QHS  . lisinopril  20 mg Oral Daily  . oxybutynin  5 mg Oral QHS  . polyethylene glycol  34 g Oral BID   Continuous Infusions: . vancomycin Stopped (07/30/17 0626)     LOS: 7 days      Phillips Climes, MD [facility] Pager [phone]  If 7PM-7AM, please contact night-coverage [email or web address] Password [email or web address] 07/30/2017, 12:14 PM

## 2017-07-30 NOTE — Progress Notes (Signed)
[facility]    Date of Admission:  07/23/2017   Total days of antibiotics 8        Day 8 vanco          ID: Laurie [name] is a 71 y.o. female with  Principal Problem:   Sepsis ([place]) Active Problems:   Diabetes mellitus type 2 with peripheral artery disease ([place])   Anxiety state   Essential hypertension   Chronic diastolic heart failure, grade 2   Dyslipidemia   Diabetic foot ulcer ([facility])   Obesity (BMI 30-39.9)   Community acquired pneumonia of left lower lobe of lung ([place])    Subjective: Afebrile. Had picc line placed yesterday  Medications:  . amLODipine  5 mg Oral Daily  . atorvastatin  20 mg Oral q1800  . bisacodyl  10 mg Oral Once  . cloNIDine  0.2 mg Oral TID  . clopidogrel  75 mg Oral Daily  . DULoxetine  60 mg Oral Daily  . enoxaparin (LOVENOX) injection  0.5 mg/kg Subcutaneous Q24H  . furosemide  40 mg Intravenous BID  . gabapentin  400 mg Oral BID  . insulin aspart  0-20 Units Subcutaneous TID WC  . insulin aspart  0-5 Units Subcutaneous QHS  . insulin detemir  40 Units Subcutaneous QHS  . ipratropium-albuterol  3 mL Nebulization TID  . lamoTRIgine  100 mg Oral QHS  . lisinopril  20 mg Oral Daily  . oxybutynin  5 mg Oral QHS  . polyethylene glycol  34 g Oral BID    Objective: Vital signs in last 24 hours: Temp:  [98.6 F (37 C)-99.1 F (37.3 C)] 98.9 F (37.2 C) (08/09 1335) Pulse Rate:  [87-95] 95 (08/09 1335) Resp:  [18-20] 18 (08/09 1335) BP: (143-155)/(52-69) 143/69 (08/09 1335) SpO2:  [91 %-96 %] 96 % (08/09 1335)    Lab Results  Recent Labs  07/27/17 2258 07/28/17 0814 07/29/17 0519 07/29/17 1818 07/30/17 0827  WBC  --   --  15.4* 13.1*  --   HGB  --   --  7.7* 9.2*  --   HCT  --   --  23.6* 27.7*  --   NA 140 141  --   --   --   K 3.8 4.2  --   --   --   CL 112* 114*  --   --   --   CO2 19* 17*  --   --   --   BUN 12 11  --   --   --   CREATININE 0.90 0.91  --   --  0.79   Liver Panel  Recent  Labs  07/28/17 0814  PROT 6.5  ALBUMIN 2.0*  AST 19  ALT 15  ALKPHOS 77  BILITOT 0.5   Sedimentation Rate  Recent Labs  07/29/17 0519  ESRSEDRATE 139*   C-Reactive Protein  Recent Labs  07/29/17 0519  CRP 21.7*    Microbiology: 8/8 blood cx ngtd Studies/Results: Ir US Guide Vasc Access Right  Result Date: 07/29/2017 INDICATION: Sepsis. Request PICC line placement for prolonged IV antibiotic therapies. EXAM: ULTRASOUND AND FLUOROSCOPIC GUIDED PICC LINE INSERTION MEDICATIONS: None. CONTRAST:  None FLUOROSCOPY TIME:  36 seconds seconds COMPLICATIONS: None immediate. TECHNIQUE: The procedure, risks, benefits, and alternatives were explained to the patient and informed written consent was obtained. A timeout was performed prior to the initiation of the procedure. The right upper extremity was prepped with chlorhexidine in a  sterile fashion, and a sterile drape was applied covering the operative field. Maximum barrier sterile technique with sterile gowns and gloves were used for the procedure. A timeout was performed prior to the initiation of the procedure. Local anesthesia was provided with 1% lidocaine. Under direct ultrasound guidance, the basilic vein was accessed with a micropuncture kit after the overlying soft tissues were anesthetized with 1% lidocaine. After the overlying soft tissues were anesthetized, a small venotomy incision was created and a micropuncture kit was utilized to access the right basilic vein. Real-time ultrasound guidance was utilized for vascular access including the acquisition of a permanent ultrasound image documenting patency of the accessed vessel. A guidewire was advanced to the level of the superior caval-atrial junction for measurement purposes and the PICC line was cut to length. A peel-away sheath was placed and a 40 cm, 5 [place], dual lumen was inserted to level of the superior caval-atrial junction. A post procedure spot fluoroscopic was obtained. The  catheter easily aspirated and flushed and was sutured in place. A dressing was placed. The patient tolerated the procedure well without immediate post procedural complication. FINDINGS: After catheter placement, the tip lies within the superior cavoatrial junction. The catheter aspirates and flushes normally and is ready for immediate use. IMPRESSION: Successful ultrasound and fluoroscopic guided placement of a right basilic vein approach, 40 cm, 5 French, dual lumen PICC with tip at the superior caval-atrial junction. The PICC line is ready for immediate use. Read by: Ascencion Dike PA-C Electronically Signed   By: Corrie Mckusick D.O.   On: 07/29/2017 12:54   Dg Chest Port 1 View  Result Date: 07/30/2017 CLINICAL DATA:  Patient admitted 07/23/2017 with sepsis. Shortness of breath today. EXAM: PORTABLE CHEST 1 VIEW COMPARISON:  Single-view of the chest 07/28/2017 and PA and lateral chest 07/23/2017. FINDINGS: Bilateral airspace disease persists but aeration is improved since the most recent examination. Heart size is upper normal. There are likely small bilateral pleural effusions. No pneumothorax. The patient has a new right PICC with the tip now in the mid to lower superior vena cava. IMPRESSION: Bilateral airspace disease compatible with multifocal pneumonia persists but has improved. Right PICC in good position. No new abnormality. Electronically Signed   By: Inge Rise M.D.   On: 07/30/2017 07:18   Dg Chest Port 1 View  Result Date: 07/28/2017 CLINICAL DATA:  PICC line positioning. EXAM: PORTABLE CHEST 1 VIEW COMPARISON:  07/23/2017 FINDINGS: Right upper extremity PICC line is been placed. This ascends in the neck within the right internal jugular vein. The tip is not visualized. Lungs show significant worsening in aeration with diffuse airspace disease noted in the right upper and lower lung zones and airspace disease also in the left lower lung. Findings are concerning for multifocal pneumonia.  There may also be a component of interstitial edema. No significant pleural fluid identified. IMPRESSION: 1. Right-sided PICC line ascends within the right internal jugular vein. 2. Significant worsening of pulmonary aeration with now significant airspace disease noted in the right upper and lower lungs as well as the left lower lung. Findings are concerning for multifocal pneumonia. There may also be a component of interstitial edema. Electronically Signed   By: Aletta Edouard M.D.   On: 07/28/2017 22:38   Ir Fluoro Guide Cv Midline Picc Right  Result Date: 07/29/2017 INDICATION: Sepsis. Request PICC line placement for prolonged IV antibiotic therapies. EXAM: ULTRASOUND AND FLUOROSCOPIC GUIDED PICC LINE INSERTION MEDICATIONS: None. CONTRAST:  None FLUOROSCOPY TIME:  36  seconds seconds COMPLICATIONS: None immediate. TECHNIQUE: The procedure, risks, benefits, and alternatives were explained to the patient and informed written consent was obtained. A timeout was performed prior to the initiation of the procedure. The right upper extremity was prepped with chlorhexidine in a sterile fashion, and a sterile drape was applied covering the operative field. Maximum barrier sterile technique with sterile gowns and gloves were used for the procedure. A timeout was performed prior to the initiation of the procedure. Local anesthesia was provided with 1% lidocaine. Under direct ultrasound guidance, the basilic vein was accessed with a micropuncture kit after the overlying soft tissues were anesthetized with 1% lidocaine. After the overlying soft tissues were anesthetized, a small venotomy incision was created and a micropuncture kit was utilized to access the right basilic vein. Real-time ultrasound guidance was utilized for vascular access including the acquisition of a permanent ultrasound image documenting patency of the accessed vessel. A guidewire was advanced to the level of the superior caval-atrial junction for  measurement purposes and the PICC line was cut to length. A peel-away sheath was placed and a 40 cm, 5 [place], dual lumen was inserted to level of the superior caval-atrial junction. A post procedure spot fluoroscopic was obtained. The catheter easily aspirated and flushed and was sutured in place. A dressing was placed. The patient tolerated the procedure well without immediate post procedural complication. FINDINGS: After catheter placement, the tip lies within the superior cavoatrial junction. The catheter aspirates and flushes normally and is ready for immediate use. IMPRESSION: Successful ultrasound and fluoroscopic guided placement of a right basilic vein approach, 40 cm, 5 French, dual lumen PICC with tip at the superior caval-atrial junction. The PICC line is ready for immediate use. Read by: Ascencion Dike PA-C Electronically Signed   By: Corrie Mckusick D.O.   On: 07/29/2017 12:54     Assessment/Plan: 70yo F with hx of DFU  But admitted for sepsis, found to have intermittent fevers, high inflammatory markers  Left foot dfu = does not appear to be source of infection, mri of foot does not show osteo  Hip pain = recommend if we can do hip mri so that can rule out septic hip as source of fever.  Recommend TTE  Baxter Flattery [facility] for Infectious Diseases Cell: [phone] Pager: [phone]  07/30/2017, 5:52 PM

## 2017-07-31 ENCOUNTER — Inpatient Hospital Stay (HOSPITAL_COMMUNITY): Payer: Medicare Other

## 2017-07-31 DIAGNOSIS — R509 Fever, unspecified: Secondary | ICD-10-CM

## 2017-07-31 LAB — VANCOMYCIN, TROUGH: VANCOMYCIN TR: 12 ug/mL — AB (ref 15–20)

## 2017-07-31 LAB — CULTURE, BLOOD (ROUTINE X 2)
Culture: NO GROWTH
Culture: NO GROWTH
SPECIAL REQUESTS: ADEQUATE
Special Requests: ADEQUATE

## 2017-07-31 LAB — GLUCOSE, CAPILLARY
GLUCOSE-CAPILLARY: 88 mg/dL (ref 65–99)
Glucose-Capillary: 141 mg/dL — ABNORMAL HIGH (ref 65–99)
Glucose-Capillary: 118 mg/dL — ABNORMAL HIGH (ref 65–99)

## 2017-07-31 MED ORDER — ONDANSETRON HCL 4 MG/2ML IJ SOLN
4.0000 mg | Freq: Four times a day (QID) | INTRAMUSCULAR | Status: DC | PRN
  Administered 2017-07-31: 4 mg via INTRAVENOUS
  Filled 2017-07-31: qty 2

## 2017-07-31 MED ORDER — HYDRALAZINE HCL 20 MG/ML IJ SOLN
5.0000 mg | INTRAMUSCULAR | Status: DC | PRN
  Filled 2017-07-31: qty 0.25

## 2017-07-31 MED ORDER — VANCOMYCIN HCL IN DEXTROSE 1-5 GM/200ML-% IV SOLN
1000.0000 mg | Freq: Two times a day (BID) | INTRAVENOUS | Status: DC
  Administered 2017-07-31 – 2017-08-02 (×4): 1000 mg via INTRAVENOUS
  Filled 2017-07-31 (×4): qty 200

## 2017-07-31 MED ORDER — ALUM & MAG HYDROXIDE-SIMETH 200-200-20 MG/5ML PO SUSP
15.0000 mL | Freq: Four times a day (QID) | ORAL | Status: DC | PRN
  Administered 2017-07-31: 15 mL via ORAL
  Filled 2017-07-31: qty 30

## 2017-07-31 MED ORDER — ONDANSETRON HCL 4 MG/2ML IJ SOLN: 4.0000 mg | Freq: Four times a day (QID) | INTRAMUSCULAR | Status: DC

## 2017-07-31 MED ORDER — TORSEMIDE 20 MG PO TABS
40.0000 mg | ORAL_TABLET | Freq: Every day | ORAL | Status: DC
  Administered 2017-07-31: 40 mg via ORAL
  Filled 2017-07-31 (×3): qty 2

## 2017-07-31 NOTE — Progress Notes (Signed)
[facility]    Date of Admission:  07/23/2017   Total days of antibiotics 9        Day 9 vanco           ID: Laurie [name] is a 71 y.o. female with initial admit for sepsis possibly due to cellulitis but inflammatory markers elevated with intermittent fever and leukocytosis Principal Problem:   Sepsis ([facility]) Active Problems:   Diabetes mellitus type 2 with peripheral artery disease ([facility])   Anxiety state   Essential hypertension   Chronic diastolic heart failure, grade 2   Dyslipidemia   Diabetic foot ulcer ([facility])   Obesity (BMI 30-39.9)   Community acquired pneumonia of left lower lobe of lung ([facility])    Subjective: Reports still ongoing hip pain, but is afebrile. Has reflux with occ nausea  Medications:  . amLODipine  5 mg Oral Daily  . atorvastatin  20 mg Oral q1800  . bisacodyl  10 mg Oral Once  . cloNIDine  0.2 mg Oral TID  . clopidogrel  75 mg Oral Daily  . DULoxetine  60 mg Oral Daily  . enoxaparin (LOVENOX) injection  0.5 mg/kg Subcutaneous Q24H  . gabapentin  400 mg Oral BID  . insulin aspart  0-20 Units Subcutaneous TID WC  . insulin aspart  0-5 Units Subcutaneous QHS  . insulin detemir  40 Units Subcutaneous QHS  . ipratropium-albuterol  3 mL Nebulization TID  . lamoTRIgine  100 mg Oral QHS  . lisinopril  20 mg Oral Daily  . oxybutynin  5 mg Oral QHS  . torsemide  40 mg Oral Daily    Objective: Vital signs in last 24 hours: Temp:  [99.1 F (37.3 C)-99.2 F (37.3 C)] 99.2 F (37.3 C) (08/10 1234) Pulse Rate:  [85-109] 109 (08/10 1234) Resp:  [18-20] 18 (08/10 0441) BP: (137-190)/(56-89) 155/56 (08/10 1420) SpO2:  [92 %-98 %] 95 % (08/10 1234)  Physical Exam  Constitutional:  oriented to person, place, and time. appears well-developed and well-nourished. No distress.  HENT: [place]/AT, PERRLA, no scleral icterus Mouth/Throat: Oropharynx is clear and moist. No oropharyngeal exudate.  Cardiovascular: Normal rate, regular rhythm and  normal heart sounds. Exam reveals no gallop and no friction rub.  No murmur heard.  Pulmonary/Chest: Effort normal and breath sounds normal. No respiratory distress.  + exp  wheezes.  Neck = supple, no nuchal rigidity Abdominal: Soft. Bowel sounds are normal.  exhibits no distension. There is no tenderness.  Lymphadenopathy: no cervical adenopathy. No axillary adenopathy Neurological: alert and oriented to person, place, and time. Ext: right bka. lymphadema changes to left leg  Skin: left heel has good granulation tissue to ulcer. No foul odor no drainage.vascular congestion changes noted Psychiatric: a normal mood and affect.  behavior is normal.   Lab Results  Recent Labs  07/29/17 0519 07/29/17 1818 07/30/17 0827  WBC 15.4* 13.1*  --   HGB 7.7* 9.2*  --   HCT 23.6* 27.7*  --   CREATININE  --   --  0.79   Sedimentation Rate  Recent Labs  07/29/17 0519  ESRSEDRATE 139*   C-Reactive Protein  Recent Labs  07/29/17 0519  CRP 21.7*    Microbiology: 8/8 blood cx pending Studies/Results: Dg Chest Port 1 View  Result Date: 07/30/2017 CLINICAL DATA:  Patient admitted 07/23/2017 with sepsis. Shortness of breath today. EXAM: PORTABLE CHEST 1 VIEW COMPARISON:  Single-view of the chest 07/28/2017 and PA and lateral chest 07/23/2017.  FINDINGS: Bilateral airspace disease persists but aeration is improved since the most recent examination. Heart size is upper normal. There are likely small bilateral pleural effusions. No pneumothorax. The patient has a new right PICC with the tip now in the mid to lower superior vena cava. IMPRESSION: Bilateral airspace disease compatible with multifocal pneumonia persists but has improved. Right PICC in good position. No new abnormality. Electronically Signed   By: Drusilla Kannerhomas  Dalessio M.D.   On: 07/30/2017 07:18     Assessment/Plan: 71yo F with hx of DFU  But admitted for sepsis, found to have intermittent fevers, high inflammatory markers - FUO  ddx  Left foot dfu = does not appear to be source of infection, mri of foot does not show osteo- continue with wound care  Hip pain = recommend if we can do hip mri so that can rule out septic hip as source of fever.  Nausea = would do prn zofran  GERD = recommend daily omeprazole.  Fever = now afebrile x 3 days and leukocytosis improving.   - If hip MRI is negative for septic arthritis, then  - would finish out 10 days of vancomycin, then stop abtx and monitor off of abtx  Will see back on Monday if still here.  Drue SecondSNIDER, [facility] Cell: [phone] Pager: [phone]  07/31/2017, 4:48 PM

## 2017-07-31 NOTE — Progress Notes (Signed)
Pharmacy Antibiotic Note - vancomycin trough follow-up Please refer to pharmacist's note from earlier today for details  Day #9 antibiotics - MRI foot negative for osteomyelitis - ID working- up up septic arthritis of hip   Antimicrobials this admission: 8/2 Zosyn>>8/7 8/2 Vanc>> 8/7 Cefazolin >> 8/7  Dose adjustments this admission: 8/6 1003VT = 8610mcg/mL on 1250mg  IV q24h, adjust to 750mg  IV q12h 8/10 1651 VT = 12 mcg/mL on 750mg  IV q12h (prior to 6th dose)  Microbiology results: 8/2BCx: NGF 8/2UCx: NGF 8/3 MRSA PCR: invalid results 8/4 MRSA PCR: invalid results, sent for cx 8/4 MRSA cx: no MRSA detected 8/4 BCx: NGTD 8/8 BCx: collected  Plan:   Increase vancomycin to 1gm IV q12h for possible septic arthritis for goal VT 15-20 mcg/mL  ID said OK to stop vancomycin after 10-days of therapy (ie. After 8/11 doses) if MRI hip negative for septic arthritis)  Juliette Alcideustin Natalina Wieting, PharmD, BCPS.   Pager: [phone] 07/31/2017 5:57 PM

## 2017-07-31 NOTE — Care Management Important Message (Signed)
Important Message  Patient Details  Name: Laurie [name] MRN: [id number] Date of Birth: 23-Jan-1946   Medicare Important Message Given:  Yes    Caren MacadamFuller, Brylen Wagar 07/31/2017, 11:12 AMImportant Message  Patient Details  Name: Laurie Brownienne W Ludlam MRN: [id number] Date of Birth: 23-Jan-1946   Medicare Important Message Given:  Yes    Caren MacadamFuller, Jahleah Mariscal 07/31/2017, 11:12 AM

## 2017-07-31 NOTE — Progress Notes (Signed)
Pharmacy Antibiotic Note  Laurie [name] is a [age] y.o. female admitted on 07/23/2017 with sepsis.  Patient is from nursing home & reported feeling feverish and with diarrhea. Sepsis source cellulitis vs. pneumonia vs. infected diabetic wound.  Patient started on vancomycin and zosyn 8/2 then narrowed to cefazolin 8/7.  Fevers continued, ID consulted and recommended resuming vancomycin for cellulitis, wound infection on 8/7.  Plan:  Vancomycin to 750mg  IV q12h  Check trough level this evening.  F/u ID recommendations  Height: 5\' 7"  (170.2 cm) Weight: 222 lb 0.1 oz (100.7 kg) IBW/kg (Calculated) : 61.6  Temp (24hrs), Avg:99.1 F (37.3 C), Min:98.9 F (37.2 C), Max:99.2 F (37.3 C)   Recent Labs Lab 07/25/17 0515 07/26/17 0510 07/27/17 1003 07/27/17 2258 07/28/17 0814 07/29/17 0519 07/29/17 1818 07/30/17 0827  WBC 19.6* 13.9*  --   --   --  15.4* 13.1*  --   CREATININE 1.01* 0.92 0.97 0.90 0.91  --   --  0.79  VANCOTROUGH  --   --  10*  --   --   --   --   --     Estimated Creatinine Clearance: 82 mL/min (by C-G formula based on SCr of 0.79 mg/dL).    Allergies  Allergen Reactions  . Penicillins Hives    Childhood allergy could have been more reaction only remembers hives.  07/23/17: Tolerated Zosyn    Antimicrobials this admission: 8/2 Zosyn >> 8/7 8/2 Vanc >>  8/7 Cefazolin >> 8/7   Dose adjustments this admission: 8/6 1003 VT = 10 mcg/mL on 1250mg  IV q24h, adjust to 750mg  IV q12h (MD notes on rounds that possible concern for osteo so VT goal increased to 15-20 mcg/mL)  Microbiology results: 8/2 BCx: NGF 8/2 UCx: NGF 8/3 MRSA PCR: invalid results 8/4 MRSA PCR: invalid results, sent for cx 8/4 MRSA cx: no MRSA detected 8/4 BCx: NGTD 8/8 BCx: collected   Thank you for allowing pharmacy to be a part of this patient's care.   Clance BollAmanda Semaj Coburn, PharmD, BCPS Pager: [phone] 07/31/2017 12:35 PM

## 2017-07-31 NOTE — Progress Notes (Signed)
LCSW continues to follow for disposition of needs:  [facility].  LCSW spoke with Olivia Mackieressa [facility] this morning, updated clinical course and treatment. Plan remains for patient to return to Nursing facility at discharge.  Daughter has been updated by previous LCSW.  No other needs. Fl2 updated and placed for signature.  Will continue to follow and assist with discharge planning needs.  Deretha EmoryHannah Jakala Herford LCSW, MSW Clinical Social Work: Optician, dispensingystem [employer] Coverage for :  [phone]

## 2017-07-31 NOTE — Progress Notes (Signed)
PROGRESS NOTE    Laurie [name]  UJW:[id number] DOB: 1946/01/09 DOA: 07/23/2017 PCP: Rometta Emery, MD   Brief Narrative:   71 year old female with past medical history of chronic diastolic heart failure, diabetes, status post right below-knee amputation and peripheral vascular disease came to the ER with complaints of generalized body aches. In the ER she was noted to be septic with elevated lactic acid without any obvious source of infection. She was started on broad-spectrum antibiotics and admitted for further management. Upon admission she was found to have left lower extremity cellulitis with possible concerns of infected left lower extremity diabetic/pressure wound. X-ray of the foot was negative and MRI showed questionable osteomyelitis but likely stress related and cellulitic changes. Left proximity Dopplers were negative. Laboratory somewhat is improving Zosyn but she continued to spike fever therefore infectious disease was consulted.  Assessment & Plan:   Principal Problem:   Sepsis (HCC) Active Problems:   Diabetes mellitus type 2 with peripheral artery disease (HCC)   Anxiety state   Essential hypertension   Chronic diastolic heart failure, grade 2   Dyslipidemia   Diabetic foot ulcer (HCC)   Obesity (BMI 30-39.9)   Community acquired pneumonia of left lower lobe of lung (HCC)   Cellulitis /infected diabetic wound -Blood cultures 8/2 -no growth to date Blood culture 8/4 -no growth today - Patient was empirically on vancomycin and Zosyn during hospital stay, ID input greatly appreciated, continue with vancomycin per ID -LE Doppers is neg for DVT - hep drip discontinued   Community acquired pneumonia - CT done on admission, significant for left basal opacity concerning for pneumonia, she reports cough, initially productive, currently significantly improved, treated with vancomycin and Zosyn, encouraged to use flutter valve, incentive spirometry, and will start on  Mucinex - Patient with evidence of volume overload, significantly improved with IV Lasix, she was on torsemide 100 mg oral daily, and resume her back on 40 mg oral daily and reassess.  Diabetic foot ulcer; stable -X-ray the foot is negative for any signs of osteomyelitis -MRI of the foot 8/5- questionable osteomyelitis versus reactive or stress change, some cellulitic changes  Fever - Patient continues to have intermittent fever, most likely due to wound infection and kinetic with pneumonia, ID input greatly appreciated, illumination for TEE and hip MRI.   Anemia; unknown etiology - acute on Chronic but hemoglobin dropped from 10.4 to 7.7 today in the last 3 days. I will recheck it again later today. Some blood loss during PICC line attempt? -Check Hemoccult -Stop Lovenox and place her on SCDs  Left hip pain; chronic -She states this is chronic and from her bursitis. CT A/P done at the time of the admission was neg for acute pathology in the hip arae.  -pain medications  1.4 cm liver lesion- metastatic disease versus atypical hemangioma versus small hepatic abscess -Patient has never had colonoscopy. She needs to follow this up outpatient. She will need colonoscopy once her acute sickness has been taken care of. -MRI of the abdomen outpatient  Diabetes type 2 -Accu-Cheks and sliding scale. Hemoglobin A1c 11.5 -Lantus 40 units.  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 According to previous vascular note from December 2017 determined patient is a poor candidate for revascularization.  Essential hypertension, elevated -Add Norvasc 5 mg daily -On clonidine 0.2 mg 3 times daily, lisinopril 20 mg daily -Remains elevated, started on when necessary hydralazine  Hyperlipidemia -On statin  DVT prophylaxis:  SCDs Code Status: Full code Family Communication: None at bedside. Disposition Plan: To be  determined  Consultants:   Infection disease  Procedures:   None  Antimicrobials:   Vanc 8/2 > 8/7  Zosyn 8/2 > 8/7   Ancef 8/7 >>>   Subjective: Patient reports she is feeling better today, no cough, no productive sputum, complains of her chronic left hip pain, otherwise denies any fever or chills.  Objective: Vitals:   07/31/17 0441 07/31/17 0742 07/31/17 1234 07/31/17 1420  BP: 137/66  (!) 190/89 (!) 155/56  Pulse: 85  (!) 109   Resp: 18     Temp: 99.1 F (37.3 C)  99.2 F (37.3 C)   TempSrc: Oral  Oral   SpO2: 98% 98% 95%   Weight:      Height:        Intake/Output Summary (Last 24 hours) at 07/31/17 1430 Last data filed at 07/31/17 1400  Gross per 24 hour  Intake              490 ml  Output             1850 ml  Net            -1360 ml   Filed Weights   07/23/17 1505  Weight: 100.7 kg (222 lb 0.1 oz)    Examination:  General exam: Pleasant, awake alert 3, sitting in bed in upper and distress  Respiratory system: Bibasilar crackles, otherwise good air entry, no use of accessory muscles  Cardiovascular system: S1 & S2 heard, regular rate and rhythm, no rubs murmurs gallops. Gastrointestinal system: Abdomen soft, nontender, nondistended, bowel sounds presents Central nervous system: No focal neurological deficits. Extremities: Left lower extremity swelling and erythema has much improved. No longer tender to palpation. Right-sided BKA noted.  Skin: Left lower extremity erythema and swelling with some warmth but no obvious fluctuation, improved    Data Reviewed:   CBC:  Recent Labs Lab 07/25/17 0515 07/26/17 0510 07/29/17 0519 07/29/17 1818  WBC 19.6* 13.9* 15.4* 13.1*  NEUTROABS  --   --  12.4*  --   HGB 10.4* 10.4* 7.7* 9.2*  HCT 31.4* 29.8* 23.6* 27.7*  MCV 91.8 88.7 90.1 89.1  PLT 243 224 329 312   Basic Metabolic Panel:  Recent Labs Lab 07/25/17 0515 07/26/17 0510 07/27/17 1003 07/27/17 2258 07/28/17 0814 07/30/17 0827  NA  140 141 139 140 141  --   K 3.0* 3.3* 4.1 3.8 4.2  --   CL 110 110 111 112* 114*  --   CO2 21* 21* 20* 19* 17*  --   GLUCOSE 163* 121* 109* 103* 150*  --   BUN 14 13 13 12 11   --   CREATININE 1.01* 0.92 0.97 0.90 0.91 0.79  CALCIUM 7.5* 7.6* 7.7* 7.8* 7.8*  --   MG 1.4* 1.7 1.9 1.9 1.9  --    GFR: Estimated Creatinine Clearance: 82 mL/min (by C-G formula based on SCr of 0.79 mg/dL). Liver Function Tests:  Recent Labs Lab 07/25/17 0515 07/26/17 0510 07/27/17 1003 07/28/17 0814  AST 26 18 24 19   ALT 20 18 17 15   ALKPHOS 76 73 76 77  BILITOT 0.5 0.3 0.5 0.5  PROT 5.9* 6.0* 6.3* 6.5  ALBUMIN 2.3* 2.2* 2.3* 2.0*   No results for input(s): LIPASE, AMYLASE in the last 168 hours. No results for input(s): AMMONIA in the last 168 hours. Coagulation Profile: No results for input(s): INR, PROTIME in the last  168 hours. Cardiac Enzymes: No results for input(s): CKTOTAL, CKMB, CKMBINDEX, TROPONINI in the last 168 hours. BNP (last 3 results) No results for input(s): PROBNP in the last 8760 hours. HbA1C: No results for input(s): HGBA1C in the last 72 hours. CBG:  Recent Labs Lab 07/30/17 1150 07/30/17 1647 07/30/17 2132 07/31/17 0739 07/31/17 1131  GLUCAP 180* 181* 195* 141* 88   Lipid Profile: No results for input(s): CHOL, HDL, LDLCALC, TRIG, CHOLHDL, LDLDIRECT in the last 72 hours. Thyroid Function Tests: No results for input(s): TSH, T4TOTAL, FREET4, T3FREE, THYROIDAB in the last 72 hours. Anemia Panel:  Recent Labs  07/29/17 0519  FERRITIN 153   Sepsis Labs:  Recent Labs Lab 07/25/17 0515 07/27/17 1003  PROCALCITON 16.58 5.70    Recent Results (from the past 240 hour(s))  Blood Culture (routine x 2)     Status: None   Collection Time: 07/23/17 10:37 AM  Result Value Ref Range Status   Specimen Description BLOOD LEFT ANTECUBITAL  Final   Special Requests   Final    BOTTLES DRAWN AEROBIC AND ANAEROBIC Blood Culture adequate volume   Culture   Final    NO  GROWTH 5 DAYS Performed at [facility], [address], [place], [place] [address]    Report Status 07/28/2017 FINAL  Final  Urine culture     Status: None   Collection Time: 07/23/17 11:51 AM  Result Value Ref Range Status   Specimen Description URINE, CATHETERIZED  Final   Special Requests NONE  Final   Culture   Final    NO GROWTH Performed at [facility], [address], [place], [place] [address]    Report Status 07/24/2017 FINAL  Final  Blood Culture (routine x 2)     Status: None   Collection Time: 07/23/17  4:39 PM  Result Value Ref Range Status   Specimen Description BLOOD BLOOD RIGHT HAND  Final   Special Requests IN PEDIATRIC BOTTLE Blood Culture adequate volume  Final   Culture   Final    NO GROWTH 5 DAYS Performed at [facility], [address], [place], [place] [address]    Report Status 07/28/2017 FINAL  Final  MRSA PCR Screening     Status: Abnormal   Collection Time: 07/24/17 11:47 PM  Result Value Ref Range Status   MRSA by PCR INVALID RESULTS, SPECIMEN SENT FOR CULTURE (A) NEGATIVE Final    Comment:        The GeneXpert MRSA Assay (FDA approved for NASAL specimens only), is one component of a comprehensive MRSA colonization surveillance program. It is not intended to diagnose MRSA infection nor to guide or monitor treatment for MRSA infections. RESULT CALLED TO, READ BACK BY AND VERIFIED WITH: D BROWN AT 0017 ON 08.04.2018 BY NBROOKS   MRSA PCR Screening     Status: Abnormal   Collection Time: 07/25/17  9:21 AM  Result Value Ref Range Status   MRSA by PCR INVALID RESULTS, SPECIMEN SENT FOR CULTURE (A) NEGATIVE Final    Comment: RESULT CALLED TO, READ BACK BY AND VERIFIED WITH: BLACKWELL, S AT 1725 ON 07/25/17 BY INGLEE        The GeneXpert MRSA Assay (FDA approved for NASAL specimens only), is one component of a comprehensive MRSA colonization surveillance program. It is not intended to diagnose MRSA infection nor to guide  or monitor treatment for MRSA infections.   MRSA culture     Status: None   Collection  Time: 07/25/17 10:12 AM  Result Value Ref Range Status   Specimen Description NOSE  Final   Special Requests NONE  Final   Culture   Final    NO MRSA DETECTED Performed at [facility], [address] [place], [place] [address]    Report Status 07/27/2017 FINAL  Final  Culture, blood (routine x 2)     Status: None   Collection Time: 07/25/17  6:07 PM  Result Value Ref Range Status   Specimen Description BLOOD BLOOD LEFT HAND  Final   Special Requests IN PEDIATRIC BOTTLE Blood Culture adequate volume  Final   Culture   Final    NO GROWTH 5 DAYS Performed at [facility], [address] [place], [place] [address]    Report Status 07/31/2017 FINAL  Final  Culture, blood (routine x 2)     Status: None   Collection Time: 07/25/17  6:07 PM  Result Value Ref Range Status   Specimen Description BLOOD BLOOD RIGHT HAND  Final   Special Requests IN PEDIATRIC BOTTLE Blood Culture adequate volume  Final   Culture   Final    NO GROWTH 5 DAYS Performed at [facility], [address] [place], [place] [address]    Report Status 07/31/2017 FINAL  Final  Culture, blood (routine x 2)     Status: None (Preliminary result)   Collection Time: 07/29/17 12:31 AM  Result Value Ref Range Status   Specimen Description BLOOD RIGHT HAND  Final   Special Requests IN PEDIATRIC BOTTLE Blood Culture adequate volume  Final   Culture   Final    NO GROWTH 2 DAYS Performed at [facility], [address] [place], [place] [address]    Report Status PENDING  Incomplete  Culture, blood (routine x 2)     Status: None (Preliminary result)   Collection Time: 07/29/17  5:20 AM  Result Value Ref Range Status   Specimen Description BLOOD BLOOD RIGHT HAND  Final   Special Requests IN PEDIATRIC BOTTLE Blood Culture adequate volume  Final   Culture   Final    NO GROWTH 2 DAYS Performed at [facility], [address] [place], [place] [address]    Report Status PENDING  Incomplete         Radiology Studies: Dg Chest Port 1 View  Result Date: 07/30/2017 CLINICAL DATA:  Patient admitted 07/23/2017 with sepsis. Shortness of breath today. EXAM: PORTABLE CHEST 1 VIEW COMPARISON:  Single-view of the chest 07/28/2017 and PA and lateral chest 07/23/2017. FINDINGS: Bilateral airspace disease persists but aeration is improved since the most recent examination. Heart size is upper normal. There are likely small bilateral pleural effusions. No pneumothorax. The patient has a new right PICC with the tip now in the mid to lower superior vena cava. IMPRESSION: Bilateral airspace disease compatible with multifocal pneumonia persists but has improved. Right PICC in good position. No new abnormality. Electronically Signed   By: Drusilla Kannerhomas  Dalessio M.D.   On: 07/30/2017 07:18        Scheduled Meds: . amLODipine  5 mg Oral Daily  . atorvastatin  20 mg Oral q1800  . bisacodyl  10 mg Oral Once  . cloNIDine  0.2 mg Oral TID  . clopidogrel  75 mg Oral Daily  . DULoxetine  60 mg Oral Daily  . enoxaparin (LOVENOX) injection  0.5 mg/kg Subcutaneous Q24H  . gabapentin  400 mg Oral BID  .  insulin aspart  0-20 Units Subcutaneous TID WC  . insulin aspart  0-5 Units Subcutaneous QHS  . insulin detemir  40 Units Subcutaneous QHS  . ipratropium-albuterol  3 mL Nebulization TID  . lamoTRIgine  100 mg Oral QHS  . lisinopril  20 mg Oral Daily  . oxybutynin  5 mg Oral QHS   Continuous Infusions: . vancomycin Stopped (07/31/17 1610)     LOS: 8 days      Huey Bienenstock, MD [facility] Pager [phone]  If 7PM-7AM, please contact night-coverage [email or web address] Password [email or web address] 07/31/2017, 2:30 PM

## 2017-08-01 ENCOUNTER — Inpatient Hospital Stay (HOSPITAL_COMMUNITY): Payer: Medicare Other

## 2017-08-01 DIAGNOSIS — I5032 Chronic diastolic (congestive) heart failure: Secondary | ICD-10-CM

## 2017-08-01 DIAGNOSIS — I10 Essential (primary) hypertension: Secondary | ICD-10-CM

## 2017-08-01 LAB — BASIC METABOLIC PANEL
Anion gap: 7 (ref 5–15)
BUN: 8 mg/dL (ref 6–20)
CHLORIDE: 105 mmol/L (ref 101–111)
CO2: 28 mmol/L (ref 22–32)
CREATININE: 0.76 mg/dL (ref 0.44–1.00)
Calcium: 7.9 mg/dL — ABNORMAL LOW (ref 8.9–10.3)
GFR calc Af Amer: >60 mL/min (ref >60)
GFR calc non Af Amer: >60 mL/min (ref >60)
Glucose, Bld: 167 mg/dL — ABNORMAL HIGH (ref 65–99)
POTASSIUM: 3.3 mmol/L — AB (ref 3.5–5.1)
SODIUM: 140 mmol/L (ref 135–145)

## 2017-08-01 LAB — ECHOCARDIOGRAM COMPLETE
HEIGHTINCHES: 67 in
WEIGHTICAEL: 3552.05 [oz_av]

## 2017-08-01 LAB — GLUCOSE, CAPILLARY
GLUCOSE-CAPILLARY: 186 mg/dL — AB (ref 65–99)
GLUCOSE-CAPILLARY: 161 mg/dL — AB (ref 65–99)
GLUCOSE-CAPILLARY: 234 mg/dL — AB (ref 65–99)
Glucose-Capillary: 164 mg/dL — ABNORMAL HIGH (ref 65–99)
Glucose-Capillary: 148 mg/dL — ABNORMAL HIGH (ref 65–99)

## 2017-08-01 LAB — CBC
HEMATOCRIT: 27.8 % — AB (ref 36.0–46.0)
HEMOGLOBIN: 8.9 g/dL — AB (ref 12.0–15.0)
MCH: 29.3 pg (ref 26.0–34.0)
MCHC: 32 g/dL (ref 30.0–36.0)
MCV: 91.4 fL (ref 78.0–100.0)
Platelets: 397 10*3/uL (ref 150–400)
RBC: 3.04 MIL/uL — AB (ref 3.87–5.11)
RDW: 15 % (ref 11.5–15.5)
WBC: 9.5 10*3/uL (ref 4.0–10.5)

## 2017-08-01 MED ORDER — TORSEMIDE 20 MG PO TABS
40.0000 mg | ORAL_TABLET | Freq: Once | ORAL | Status: AC
Start: 2017-08-01 — End: 2017-08-01
  Administered 2017-08-01: 40 mg via ORAL
  Filled 2017-08-01: qty 2

## 2017-08-01 MED ORDER — POTASSIUM CHLORIDE CRYS ER 20 MEQ PO TBCR
30.0000 meq | EXTENDED_RELEASE_TABLET | Freq: Four times a day (QID) | ORAL | Status: AC
  Administered 2017-08-01 (×2): 30 meq via ORAL
  Filled 2017-08-01 (×2): qty 1

## 2017-08-01 MED ORDER — GADOBENATE DIMEGLUMINE 529 MG/ML IV SOLN
20.0000 mL | Freq: Once | INTRAVENOUS | Status: AC | PRN
  Administered 2017-08-01: 20 mL via INTRAVENOUS

## 2017-08-01 MED ORDER — TORSEMIDE 20 MG PO TABS
80.0000 mg | ORAL_TABLET | Freq: Every day | ORAL | Status: DC
  Administered 2017-08-02: 80 mg via ORAL
  Filled 2017-08-01: qty 4

## 2017-08-01 MED ORDER — PANTOPRAZOLE SODIUM 40 MG PO TBEC
40.0000 mg | DELAYED_RELEASE_TABLET | Freq: Every day | ORAL | Status: DC
  Administered 2017-08-01 – 2017-08-02 (×2): 40 mg via ORAL
  Filled 2017-08-01 (×2): qty 1

## 2017-08-01 NOTE — Progress Notes (Addendum)
PROGRESS NOTE    Laurie [name]  UJW:[id number] DOB: [birth year] DOA: 07/23/2017 PCP: Rometta Emery, MD   Brief Narrative:   71 year old female with past medical history of chronic diastolic heart failure, diabetes, status post right below-knee amputation and peripheral vascular disease came to the ER with complaints of generalized body aches. In the ER she was noted to be septic with elevated lactic acid without any obvious source of infection. She was started on broad-spectrum antibiotics and admitted for further management. Upon admission she was found to have left lower extremity cellulitis with possible concerns of infected left lower extremity diabetic/pressure wound. X-ray of the foot was negative and MRI showed questionable osteomyelitis but likely stress related and cellulitic changes. Left proximity Dopplers were negative. Laboratory somewhat is improving Zosyn but she continued to spike fever therefore infectious disease was consulted.  Assessment & Plan:   Principal Problem:   Sepsis (HCC) Active Problems:   Diabetes mellitus type 2 with peripheral artery disease (HCC)   Anxiety state   Essential hypertension   Chronic diastolic heart failure, grade 2   Dyslipidemia   Diabetic foot ulcer (HCC)   Obesity (BMI 30-39.9)   Community acquired pneumonia of left lower lobe of lung (HCC)   Cellulitis /infected diabetic wound -Blood cultures 8/2 -no growth to date Blood culture 8/4 -no growth today - Patient was empirically on vancomycin and Zosyn during hospital stay, ID input greatly appreciated, continue with vancomycin per ID, to finish total of 10 days, today will be number day #10. -LE Doppers is neg for DVT - hep drip discontinued   Community acquired pneumonia - CT done on admission, significant for left basal opacity concerning for pneumonia, she reports cough, initially productive, currently significantly improved, treated with vancomycin and Zosyn, encouraged to use  flutter valve, incentive spirometry, and will start on Mucinex  Diabetic foot ulcer; stable -X-ray the foot is negative for any signs of osteomyelitis -MRI of the foot 8/5- questionable osteomyelitis versus reactive or stress change, some cellulitic changes  Fever - Patient continues to have intermittent fever, most likely due to wound infection andpneumonia, ID input greatly appreciated, 2-D echo has been done, awaiting results, MRI pelvis to evaluate for chronic pending as well, if both studies are negative, overall recommendation to treat for total of 10 days of vancomycin  Chronic diastolic heart failure - Patient on torsemide 100 mg oral daily, held initially , currently back on diuresis, she is on 80 mg torsemide daily from yesterday, overall +6 L during hospital stay total today.  Anemia; unknown etiology - acute on Chronic , stable  Left hip pain; chronic -She states this is chronic and from her bursitis. CT A/P done at the time of the admission was neg for acute pathology in the hip arae. Given her recurrent fever, will obtain MRI pelvis or the evaluation, discussed with radiology, MRI pelvis will be able to rule out infectious process in her left hip . -pain medications  1.4 cm liver lesion- metastatic disease versus atypical hemangioma versus small hepatic abscess -Patient has never had colonoscopy. She needs to follow this up outpatient. She will need colonoscopy once her acute sickness has been taken care of. -MRI of the abdomen outpatient  Diabetes type 2 -Accu-Cheks and sliding scale. Hemoglobin A1c 11.5 -Lantus 40 units.  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 According to previous vascular note from December 2017 determined patient is a poor candidate for revascularization.  Essential hypertension, elevated - Labile, but overall acceptable, continue with clonidine, lisinopril, and  Norvasc recently been started, continue with when necessary hydralazine.  Hyperlipidemia -On statin  DVT prophylaxis: SCDs Code Status: Full code Family Communication: None at bedside. Disposition Plan: Back to SNF in a.m. if hip and echo are negative  Consultants:   Infection disease  Procedures:   None  Antimicrobials:   Vanc 8/2 >   Zosyn 8/2 > 8/7   Ancef 8/7 >>>   Subjective: Patient reports he denies any complaints today, no cough, no productive sputum, no fever or chills she is feeling better today, no cough, no productive sputum, complains of her chronic left hip pain, otherwise denies any fever or chills.  Objective: Vitals:   07/31/17 2133 07/31/17 2137 08/01/17 0336 08/01/17 0921  BP: (!) 144/67  132/64   Pulse: 84  74   Resp: 18  18   Temp: 98.5 F (36.9 C)  98.7 F (37.1 C)   TempSrc: Oral  Oral   SpO2: (!) 87% 96% 96% 95%  Weight:      Height:        Intake/Output Summary (Last 24 hours) at 08/01/17 1129 Last data filed at 08/01/17 9562  Gross per 24 hour  Intake              750 ml  Output              800 ml  Net              -50 ml   Filed Weights   07/23/17 1505  Weight: 100.7 kg (222 lb 0.1 oz)    Examination:  General exam: Awake alert 3, no apparent distress Respiratory system: Good air entry bilaterally, clear to auscultation  Cardiovascular system: S1 & S2 heard, regular rate and rhythm, no rubs murmurs gallops. Gastrointestinal system: soft, nontender, nondistended, bowel sounds present Central nervous system: No focal neurological deficits. Extremities: Left lower extremity with no evidence of cellulitis or erythema,Right-sided BKA noted.     Data Reviewed:   CBC:  Recent Labs Lab 07/26/17 0510 07/29/17 0519 07/29/17 1818 08/01/17 0458  WBC 13.9* 15.4* 13.1* 9.5  NEUTROABS  --  12.4*  --   --   HGB 10.4* 7.7* 9.2* 8.9*  HCT 29.8* 23.6* 27.7* 27.8*  MCV 88.7 90.1 89.1 91.4  PLT 224 329 312 397   Basic  Metabolic Panel:  Recent Labs Lab 07/26/17 0510 07/27/17 1003 07/27/17 2258 07/28/17 0814 07/30/17 0827 08/01/17 0458  NA 141 139 140 141  --  140  K 3.3* 4.1 3.8 4.2  --  3.3*  CL 110 111 112* 114*  --  105  CO2 21* 20* 19* 17*  --  28  GLUCOSE 121* 109* 103* 150*  --  167*  BUN 13 13 12 11   --  8  CREATININE 0.92 0.97 0.90 0.91 0.79 0.76  CALCIUM 7.6* 7.7* 7.8* 7.8*  --  7.9*  MG 1.7 1.9 1.9 1.9  --   --    GFR: Estimated Creatinine Clearance: 82 mL/min (by C-G formula based on SCr of 0.76 mg/dL). Liver Function Tests:  Recent Labs Lab 07/26/17 0510 07/27/17 1003 07/28/17 0814  AST 18 24 19   ALT 18 17 15   ALKPHOS 73 76 77  BILITOT 0.3 0.5 0.5  PROT 6.0* 6.3* 6.5  ALBUMIN 2.2* 2.3* 2.0*   No results for input(s): LIPASE, AMYLASE in the last 168 hours. No results for input(s): AMMONIA  in the last 168 hours. Coagulation Profile: No results for input(s): INR, PROTIME in the last 168 hours. Cardiac Enzymes: No results for input(s): CKTOTAL, CKMB, CKMBINDEX, TROPONINI in the last 168 hours. BNP (last 3 results) No results for input(s): PROBNP in the last 8760 hours. HbA1C: No results for input(s): HGBA1C in the last 72 hours. CBG:  Recent Labs Lab 07/31/17 0739 07/31/17 1131 07/31/17 1639 07/31/17 2130 08/01/17 0746  GLUCAP 141* 88 118* 186* 164*   Lipid Profile: No results for input(s): CHOL, HDL, LDLCALC, TRIG, CHOLHDL, LDLDIRECT in the last 72 hours. Thyroid Function Tests: No results for input(s): TSH, T4TOTAL, FREET4, T3FREE, THYROIDAB in the last 72 hours. Anemia Panel: No results for input(s): VITAMINB12, FOLATE, FERRITIN, TIBC, IRON, RETICCTPCT in the last 72 hours. Sepsis Labs:  Recent Labs Lab 07/27/17 1003  PROCALCITON 5.70    Recent Results (from the past 240 hour(s))  Blood Culture (routine x 2)     Status: None   Collection Time: 07/23/17 10:37 AM  Result Value Ref Range Status   Specimen Description BLOOD LEFT ANTECUBITAL  Final    Special Requests   Final    BOTTLES DRAWN AEROBIC AND ANAEROBIC Blood Culture adequate volume   Culture   Final    NO GROWTH 5 DAYS Performed at [facility], [address] [place], [place] [address]    Report Status 07/28/2017 FINAL  Final  Urine culture     Status: None   Collection Time: 07/23/17 11:51 AM  Result Value Ref Range Status   Specimen Description URINE, CATHETERIZED  Final   Special Requests NONE  Final   Culture   Final    NO GROWTH Performed at [facility], [address], [place], [place] [address]    Report Status 07/24/2017 FINAL  Final  Blood Culture (routine x 2)     Status: None   Collection Time: 07/23/17  4:39 PM  Result Value Ref Range Status   Specimen Description BLOOD BLOOD RIGHT HAND  Final   Special Requests IN PEDIATRIC BOTTLE Blood Culture adequate volume  Final   Culture   Final    NO GROWTH 5 DAYS Performed at [facility], [address] [place], [place] [address]    Report Status 07/28/2017 FINAL  Final  MRSA PCR Screening     Status: Abnormal   Collection Time: 07/24/17 11:47 PM  Result Value Ref Range Status   MRSA by PCR INVALID RESULTS, SPECIMEN SENT FOR CULTURE (A) NEGATIVE Final    Comment:        The GeneXpert MRSA Assay (FDA approved for NASAL specimens only), is one component of a comprehensive MRSA colonization surveillance program. It is not intended to diagnose MRSA infection nor to guide or monitor treatment for MRSA infections. RESULT CALLED TO, READ BACK BY AND VERIFIED WITH: D BROWN AT 0017 ON 08.04.2018 BY NBROOKS   MRSA PCR Screening     Status: Abnormal   Collection Time: 07/25/17  9:21 AM  Result Value Ref Range Status   MRSA by PCR INVALID RESULTS, SPECIMEN SENT FOR CULTURE (A) NEGATIVE Final    Comment: RESULT CALLED TO, READ BACK BY AND VERIFIED WITH: BLACKWELL, S AT 1725 ON 07/25/17 BY INGLEE        The GeneXpert MRSA Assay (FDA approved for NASAL specimens only), is one component  of a comprehensive MRSA colonization surveillance program. It is not intended to diagnose MRSA infection nor to guide or monitor  treatment for MRSA infections.   MRSA culture     Status: None   Collection Time: 07/25/17 10:12 AM  Result Value Ref Range Status   Specimen Description NOSE  Final   Special Requests NONE  Final   Culture   Final    NO MRSA DETECTED Performed at [facility], [address], [place], [place] [address]    Report Status 07/27/2017 FINAL  Final  Culture, blood (routine x 2)     Status: None   Collection Time: 07/25/17  6:07 PM  Result Value Ref Range Status   Specimen Description BLOOD BLOOD LEFT HAND  Final   Special Requests IN PEDIATRIC BOTTLE Blood Culture adequate volume  Final   Culture   Final    NO GROWTH 5 DAYS Performed at [facility], [address] [place], [place] [address]    Report Status 07/31/2017 FINAL  Final  Culture, blood (routine x 2)     Status: None   Collection Time: 07/25/17  6:07 PM  Result Value Ref Range Status   Specimen Description BLOOD BLOOD RIGHT HAND  Final   Special Requests IN PEDIATRIC BOTTLE Blood Culture adequate volume  Final   Culture   Final    NO GROWTH 5 DAYS Performed at [facility], [address], [place], [place] [address]    Report Status 07/31/2017 FINAL  Final  Culture, blood (routine x 2)     Status: None (Preliminary result)   Collection Time: 07/29/17 12:31 AM  Result Value Ref Range Status   Specimen Description BLOOD RIGHT HAND  Final   Special Requests IN PEDIATRIC BOTTLE Blood Culture adequate volume  Final   Culture   Final    NO GROWTH 2 DAYS Performed at [facility], [address], [place], [place] [address]    Report Status PENDING  Incomplete  Culture, blood (routine x 2)     Status: None (Preliminary result)   Collection Time: 07/29/17  5:20 AM  Result Value Ref Range Status   Specimen Description BLOOD BLOOD RIGHT HAND  Final   Special Requests  IN PEDIATRIC BOTTLE Blood Culture adequate volume  Final   Culture   Final    NO GROWTH 2 DAYS Performed at [facility], [address] [place], [place] [address]    Report Status PENDING  Incomplete         Radiology Studies: No results found.      Scheduled Meds: . amLODipine  5 mg Oral Daily  . atorvastatin  20 mg Oral q1800  . bisacodyl  10 mg Oral Once  . cloNIDine  0.2 mg Oral TID  . clopidogrel  75 mg Oral Daily  . DULoxetine  60 mg Oral Daily  . enoxaparin (LOVENOX) injection  0.5 mg/kg Subcutaneous Q24H  . gabapentin  400 mg Oral BID  . insulin aspart  0-20 Units Subcutaneous TID WC  . insulin aspart  0-5 Units Subcutaneous QHS  . insulin detemir  40 Units Subcutaneous QHS  . ipratropium-albuterol  3 mL Nebulization TID  . lamoTRIgine  100 mg Oral QHS  . lisinopril  20 mg Oral Daily  . oxybutynin  5 mg Oral QHS  . pantoprazole  40 mg Oral Daily  . potassium chloride  30 mEq Oral Q6H  . torsemide  40 mg Oral Daily   Continuous Infusions: . vancomycin Stopped (08/01/17 0726)     LOS: 9 days  Huey BienenstockELGERGAWY, DAWOOD, MD [facility] Pager [phone]  If 7PM-7AM, please contact night-coverage [email or web address] Password [email or web address] 08/01/2017, 11:29 AM

## 2017-08-01 NOTE — Evaluation (Signed)
Physical Therapy Evaluation Patient Details Name: Laurie [name] MRN: [id number] DOB: 11/18/1946 Today's Date: 08/01/2017   History of Present Illness  71 year old female with past medical history of chronic diastolic heart failure, diabetes, status post right below-knee amputation and peripheral vascular disease came to the ER with complaints of generalized body aches; MRI right  hip pending (r/o possible source of infection0  Clinical Impression  Pt admitted with above diagnosis. Pt currently with functional limitations due to the deficits listed below (see PT Problem List). *Pt will benefit from skilled PT to increase their independence and safety with mobility to allow discharge to the venue listed below.   Pt motivated and agreeable to working with PT; OOB deferred d/t plan for MRI around lunch time; at baseline pt is able to scoot into her w/c with supervision; wil follow in acute setting; recommend return to SNF post acute     Follow Up Recommendations SNF    Equipment Recommendations  None recommended by PT    Recommendations for Other Services       Precautions / Restrictions Precautions Precautions: Fall      Mobility  Bed Mobility Overal bed mobility: Needs Assistance Bed Mobility: Supine to Sit;Sit to Supine;Rolling Rolling: Supervision   Supine to sit: Min assist Sit to supine: Min assist   General bed mobility comments: assist with LLE; pt is able to scoot up in supine with bed in boost mode using LLE and LUE with min assist  Transfers                 General transfer comment: NT d/t pt going to MRI  Ambulation/Gait                Stairs            Wheelchair Mobility    Modified Rankin (Stroke Patients Only)       Balance Overall balance assessment: Needs assistance   Sitting balance-Leahy Scale: Good Sitting balance - Comments: pt wt shifts laterally in both directions with min/guard for safety; scoots forward and back with  min/guard for safety                                     Pertinent Vitals/Pain Pain Assessment: 0-10 Pain Location: LLE with movement Pain Descriptors / Indicators: Sore Pain Intervention(s): Limited activity within patient's tolerance;Monitored during session    Home Living Family/patient expects to be discharged to:: Skilled nursing facility Living Arrangements: Children                    Prior Function Level of Independence: Needs assistance   Gait / Transfers Assistance Needed: pt reports she performs scooting transfer to bed<>w/c, w/c<>BSC           Hand Dominance        Extremity/Trunk Assessment   Upper Extremity Assessment Upper Extremity Assessment: LUE deficits/detail LUE Deficits / Details: 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; RUE WFL    Lower Extremity Assessment Lower Extremity Assessment: RLE deficits/detail;LLE deficits/detail RLE Deficits / Details: BKA--AROM WFL; strength  3+/5 LLE Deficits / Details: knee AROM grossly WFL, foot/ankle  NT--PRAFO left in place; hip flexion to 90*, limited by pain       Communication      Cognition Arousal/Alertness: Awake/alert Behavior During Therapy: WFL for tasks assessed/performed Overall Cognitive Status: Within Functional Limits for tasks assessed  General Comments      Exercises     Assessment/Plan    PT Assessment Patient needs continued PT services  PT Problem List Decreased strength;Decreased mobility;Decreased activity tolerance;Pain       PT Treatment Interventions DME instruction;Therapeutic activities;Therapeutic exercise;Patient/family education;Functional mobility training    PT Goals (Current goals can be found in the Care Plan section)  Acute Rehab PT Goals Patient Stated Goal: be able to drive and work on my strength PT Goal Formulation: With patient Time For Goal Achievement: 08/08/17 Potential to Achieve Goals:  Good    Frequency Min 2X/week   Barriers to discharge        Co-evaluation               AM-PAC PT "6 Clicks" Daily Activity  Outcome Measure Difficulty turning over in bed (including adjusting bedclothes, sheets and blankets)?: Total Difficulty moving from lying on back to sitting on the side of the bed? : Total Difficulty sitting down on and standing up from a chair with arms (e.g., wheelchair, bedside commode, etc,.)?: Total Help needed moving to and from a bed to chair (including a wheelchair)?: A Lot Help needed walking in hospital room?: Total Help needed climbing 3-5 steps with a railing? : Total 6 Click Score: 7    End of Session   Activity Tolerance: Patient tolerated treatment well Patient left: with call bell/phone within reach;in bed;with bed alarm set   PT Visit Diagnosis: Muscle weakness (generalized) (M62.81);Hemiplegia and hemiparesis Hemiplegia - Right/Left: Left Hemiplegia - dominant/non-dominant: Dominant    Time: [id number] PT Time Calculation (min) (ACUTE ONLY): 24 min   Charges:   PT Evaluation $PT Eval Moderate Complexity: 1 Mod PT Treatments $Therapeutic Activity: 8-22 mins   PT G CodesDrucilla [name]:        Laurie [name], PT Pager: [phone] 08/01/2017   [facility] HospitalWILLIAMS,Mette [place] 08/01/2017, 12:42 PM

## 2017-08-01 NOTE — Progress Notes (Signed)
OT Cancellation Note  Patient Details Name: Laurie [name] MRN: [id number] DOB: 1946-02-27   Cancelled Treatment:    Reason Eval/Treat Not Completed: Other (comment). Pt fromm SNF and plan is to DC back to SNF. Will defer OT needs to SNF.  [facility] LLCWARD,HILLARY  Tanaja Ganger, OT/L  [phone] 08/01/2017 08/01/2017, 3:30 PM

## 2017-08-01 NOTE — Clinical Social Work Note (Signed)
Per discussion with Dr. Randol KernElgergawy- [facility] for tomorrow. She will not require IV antibiotics and PICC line will be removed prior to d/c.  CSW spoke with Rosey Batheresa- [facility] re: above. She states that bed is available tomorrow and can be reached on her cell phone.  She requested that, if possible- for patient to have DC summary and return to facility as early as possible tomorrow.  Report should be called to [phone] tomorrow.  Lorri Frederickonna T. Jaci LazierCrowder, [place] [phone]

## 2017-08-01 NOTE — Progress Notes (Signed)
  Echocardiogram 2D Echocardiogram has been performed.  Laurie [name] 08/01/2017, 11:19 AM

## 2017-08-02 DIAGNOSIS — E785 Hyperlipidemia, unspecified: Secondary | ICD-10-CM

## 2017-08-02 LAB — GLUCOSE, CAPILLARY
GLUCOSE-CAPILLARY: 146 mg/dL — AB (ref 65–99)
GLUCOSE-CAPILLARY: 179 mg/dL — AB (ref 65–99)

## 2017-08-02 MED ORDER — IPRATROPIUM-ALBUTEROL 0.5-2.5 (3) MG/3ML IN SOLN: 3.0000 mL | Freq: Four times a day (QID) | RESPIRATORY_TRACT | Status: DC | PRN

## 2017-08-02 MED ORDER — HYDROCODONE-ACETAMINOPHEN 5-325 MG PO TABS: 1.0000 | ORAL_TABLET | Freq: Four times a day (QID) | ORAL | 0 refills | Status: DC | PRN

## 2017-08-02 MED ORDER — POTASSIUM CHLORIDE CRYS ER 20 MEQ PO TBCR
40.0000 meq | EXTENDED_RELEASE_TABLET | Freq: Once | ORAL | Status: AC
  Administered 2017-08-02: 40 meq via ORAL
  Filled 2017-08-02: qty 2

## 2017-08-02 MED ORDER — INSULIN DETEMIR 100 UNIT/ML ~~LOC~~ SOLN: 50.0000 [IU] | Freq: Every day | SUBCUTANEOUS | 11 refills | Status: DC

## 2017-08-02 MED ORDER — AMLODIPINE BESYLATE 5 MG PO TABS: 5.0000 mg | ORAL_TABLET | Freq: Every day | ORAL | Status: AC

## 2017-08-02 NOTE — Care Management Note (Addendum)
Case Management Note  Patient Details  Name: Druscilla Brownienne W Suder MRN: [id number] Date of Birth: [birth year]  Subjective/Objective:     Sepsis, DM, HTN, CHF               Action/Plan: Discharge Planning: Chart reviewed. CSW following for SNF placement. [facility]. Notified [facility] that pt was dc to SNF.   PCP  Rometta EmeryGARBA, MOHAMMAD L MD  Expected Discharge Date:  08/02/17               Expected Discharge Plan:  Skilled Nursing Facility  In-House Referral:  Clinical Social Work  Discharge planning Services  CM Consult  Post Acute Care Choice:  NA Choice offered to:  NA  DME Arranged:  N/A DME Agency:  NA  HH Arranged:  NA HH Agency:  NA  Status of Service:  Completed, signed off  If discussed at [employer] Meetings, dates discussed:    Additional Comments:  Elliot CousinShavis, Karyn Brull Ellen, RN 08/02/2017, 11:21 AM

## 2017-08-02 NOTE — Progress Notes (Addendum)
CSW following to assist with DC. Pt returning to [facility] where she is a long term care resident. Report # [id number].  Pt will transport via [facility]- CSW completed medical necessity form and will arrange for transportation once PICC line removed and pt ready for DC.  Pt states she has informed her [place] in [place] of her plan to [facility] today and denies need for CSW to contact any family.  [facility] for pt to return. CSW will send all information in the HUB (facility [facility] in [facility]).  Ilean SkillMeghan Dravyn Severs, MSW, LCSW Clinical Social Work 08/02/2017 Weekend coverage [phone]

## 2017-08-02 NOTE — Discharge Summary (Signed)
Laurie [name], is a 71 y.o. female  DOB 18-Sep-1946  MRN [id number].  Admission date:  07/23/2017  Admitting Physician  Annita Brod, MD  Discharge Date:  08/02/2017   Primary MD  Elwyn Reach, MD  Recommendations for primary care physician for things to follow:  - Please check CBC, BMP in 3 days from discharge - Patient will need screening colonoscopy as an outpatient, she will need MRI abdomen for further evaluation of an 0.4 cm lesion in liver.   Admission Diagnosis  Fever of unknown origin [R50.9] Fever in adult [R50.9]   Discharge Diagnosis  Fever of unknown origin [R50.9] Fever in adult [R50.9]    Principal Problem:   Sepsis ([place]) Active Problems:   Diabetes mellitus type 2 with peripheral artery disease (HCC)   Anxiety state   Essential hypertension   Chronic diastolic heart failure, grade 2   Dyslipidemia   Diabetic foot ulcer (HCC)   Obesity (BMI 30-39.9)   Community acquired pneumonia of left lower lobe of lung ([place])      Past Medical History:  Diagnosis Date  . Anemia   . Anxiety   . Bacteremia   . Bursitis    "left hip; sometimes left shoulder" (03/05/2015)  . Chest pain    Troponins up to 0.12 while hospitalized for E.coli PNA  . Depression   . Family history of adverse reaction to anesthesia    "brother died 2013/07/01; woke up w/headache, vomited blood; drilled holes in his head; bleeding stroke; never came back" (03/05/2015)  . Headache    "when someone gets on my nerves" (03/05/2015)  . High cholesterol   . Hypertension   . Narcolepsy   . 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 Stroke [facility]) 10/2014   "weaker on left side since" (03/05/2015)  . Type II diabetes mellitus ([place])   . Walking pneumonia 04/2009   "double" /notes 04/23/2011    Past Surgical History:  Procedure Laterality Date  . ABDOMINAL AORTAGRAM N/A 03/05/2015   Procedure:  ABDOMINAL Maxcine Ham;  Surgeon: Angelia Mould, MD;  Location: [facility] CATH LAB;  Service: Cardiovascular;  Laterality: N/A;  . AMPUTATION Right 11/03/2014   Procedure: AMPUTATION BELOW KNEE;  Surgeon: Elam Dutch, MD;  Location: [place];  Service: Vascular;  Laterality: Right;  . FEMORAL-TIBIAL BYPASS GRAFT Left 03/06/2015   Procedure: BYPASS GRAFT Left FEMORAL-POSTERIOR TIBIAL ARTERY with reversed saphenous vein;  Surgeon: Elam Dutch, MD;  Location: [place];  Service: Vascular;  Laterality: Left;  . INTRAOPERATIVE ARTERIOGRAM Left 03/06/2015   Procedure: INTRA OPERATIVE ARTERIOGRAM;  Surgeon: Elam Dutch, MD;  Location: [place];  Service: Vascular;  Laterality: Left;  . IR FLUORO GUIDE CV MIDLINE PICC RIGHT  07/29/2017  . IR US GUIDE VASC ACCESS RIGHT  07/29/2017  . IRRIGATION AND DEBRIDEMENT FOOT Left 12/05/2016   Procedure: IRRIGATION AND DEBRIDEMENT FOOT;  Surgeon: Samara Deist, DPM;  Location: [facility] ORS;  Service: Podiatry;  Laterality: Left;  .  LOWER EXTREMITY ANGIOGRAM N/A 10/03/2014   Procedure: LOWER EXTREMITY ANGIOGRAM;  Surgeon: Laverda Page, MD;  Location: [facility] CATH LAB;  Service: Cardiovascular;  Laterality: N/A;  . PERIPHERAL VASCULAR CATHETERIZATION Left 12/03/2016   Procedure: Lower Extremity Angiography;  Surgeon: Algernon Huxley, MD;  Location: [place] CV LAB;  Service: Cardiovascular;  Laterality: Left;       History of present illness and  Hospital Course:     Kindly see H&P for history of present illness and admission details, please review complete Labs, Consult reports and Test reports for all details in brief  HPI  from the history and physical done on the day of admission 07/23/2017  HPI: MADESYN AST is a 71 y.o. female with medical history significant of chronic diastolic heart failure, diabetes mellitus and status post right BKA who presented to the emergency room with complaints of hurting all over patient states she woke up this morning he started  feeling very feverish. She then said that she felt like her whole body was aching. Denied any dysuria or shortness of breath although did note that in the last day when she had a bowel movement, it was very liquidy. She had no abdominal pain or cramping though.  ED Course: In the emergency room, patient's blood pressures are noted to be soft with a systolic in the 841L. She is borderline tachycardic with temp of 101. Lab work noted normal white count with 91% neutrophils but a lactic acid level 2.17.  Patient's creatinine slightly elevated at 1.17 with a baseline of 0.87 last year. CBGs in the mid 200s. Patient had chest x-ray and a left ankle x-ray (to look at a chronic left heel wound), but both were on revealing. No signs of pneumonia, effusions and no signs of osteomyelitis. Patient had blood cultures drawn, started on broad-spectrum antibiotics and she was given IV fluid bolus. Hospitalists were called for further evaluation and admission  Hospital Course   71 year old female with past medical history of chronic diastolic heart failure, diabetes, status post right below-knee amputation and peripheral vascular disease came to the ER with complaints of generalized body aches. In the ER she was noted to be septic with elevated lactic acid without any obvious source of infection. She was started on broad-spectrum antibiotics and admitted for further management. Upon admission she was found to have left lower extremity cellulitis with possible concerns of infected left lower extremity diabetic/pressure wound. X-ray of the foot was negative and MRI showed questionable osteomyelitis but likely stress related and cellulitic changes. Left proximity Dopplers were negative. ID service were consulted,and with chronic left hip pain, MRI pelvis with no evidence of infection, 2-D echo with no evidence of focal disease or endocarditis, she treated with total of 10 days of vancomycin per ID recommendation and stable for  discharge.  Cellulitis /infected diabetic wound -Blood cultures 8/2 -no growth to date - Blood culture 8/4 -no growth today - Patient was empirically on vancomycin and Zosyn during hospital stay, ID input greatly appreciated, treated with total of 10 days of IV vancomycin, . -LE Doppers is neg for DVT - hep drip discontinued   Community acquired pneumonia - CT done on admission, significant for left basal opacity concerning for pneumonia, she reports cough, initially productive, currently significantly improved, treated with vancomycin and Zosyn, encouraged to use flutter valve, incentive spirometry during hospital stay.  Diabetic foot ulcer; stable -X-ray the foot is negative for any signs of osteomyelitis -MRI of the foot 8/5-  abnormal signal in lateral periphery of the calcaneus , favoring stress changes , less likely osteomyelitis.  Fever - Patient continues to have intermittent fever first few days in hospital stays, her bronchopneumonia is being treated, as well as cellulitis has improved, so ID were consulted, mentation to treat total of 10 days of IV vancomycin, which she did finish, 2-D echo with no evidence of endocarditis or valvular heart disease, MRI pelvis has been obtained to evaluate for chronic left hip pain, it has no evidence of infection .  1.4 cm liver lesion- metastatic disease versus atypical hemangioma versus small hepatic abscess -Patient has never had colonoscopy. She needs to follow this up outpatient. She will need colonoscopy once her acute sickness has been taken care of. -MRI of the abdomen outpatient  Chronic diastolic heart failure - Patient on torsemide 100 mg oral daily, held initially ,  was on IV fluids, , started back on Lasix given volume overload, currently euvolemic, to be discharged on home dose torsemide .  Anemia; unknown etiology - acute on Chronic , stable  Left hip pain; chronic -She states this is chronic and from her bursitis. CT A/P  done at the time of the admission was neg for acute pathology in the hip arae. MRI pelvis negative for any acute pathology  Diabetes type 2 - Hemoglobin A1c 11.5, discharge and insulin sliding scale, and Levemir 15 units.  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 According to previous vascular note from December 2017 determined patient is a poor candidate for revascularization.  Essential hypertension, elevated - Labile, but overall acceptable, continue with clonidine, lisinopril, and Norvasc recently been started,  Hyperlipidemia -On statin   Discharge Condition:  stable   Follow UP  Contact information for after-discharge care    Destination    [facility] Follow up.   Specialty:  [place] information: [address] [place] [phone]                Discharge Instructions  and  Discharge Medications     Discharge Instructions    Discharge instructions    Complete by:  As directed    Follow with Primary MD Elwyn Reach, MD in 7 days   Get CBC, CMP, 2 view Chest X ray checked  by Primary MD next visit.    Activity: Per PT recommendation  Disposition SNF   Diet: Heart Healthy , carbohydrate modified with feeding assistance and aspiration precautions.  For Heart failure patients - Check your Weight same time everyday, if you gain over 2 pounds, or you develop in leg swelling, experience more shortness of breath or chest pain, call your Primary MD immediately. Follow Cardiac Low Salt Diet and 1.5 lit/day fluid restriction.   On your next visit with your primary care physician please Get Medicines reviewed and adjusted.   Please request your Prim.MD to go over all Hospital Tests and Procedure/Radiological results at the follow up, please get all Hospital records sent to your Prim MD by signing hospital  release before you go home.   If you experience worsening of your admission symptoms, develop shortness of breath, life threatening emergency, suicidal or homicidal thoughts you must seek medical attention immediately by calling 911 or calling your MD immediately  if symptoms less severe.  You Must read complete instructions/literature along with all the possible adverse reactions/side effects for all the Medicines you take and that have been  prescribed to you. Take any new Medicines after you have completely understood and accpet all the possible adverse reactions/side effects.   Do not drive, operating heavy machinery, perform activities at heights, swimming or participation in water activities or provide baby sitting services if your were admitted for syncope or siezures until you have seen by Primary MD or a Neurologist and advised to do so again.  Do not drive when taking Pain medications.    Do not take more than prescribed Pain, Sleep and Anxiety Medications  Special Instructions: If you have smoked or chewed Tobacco  in the last 2 yrs please stop smoking, stop any regular Alcohol  and or any Recreational drug use.  Wear Seat belts while driving.   Please note  You were cared for by a hospitalist during your hospital stay. If you have any questions about your discharge medications or the care you received while you were in the hospital after you are discharged, you can call the unit and asked to speak with the hospitalist on call if the hospitalist that took care of you is not available. Once you are discharged, your primary care physician will handle any further medical issues. Please note that NO REFILLS for any discharge medications will be authorized once you are discharged, as it is imperative that you return to your primary care physician (or establish a relationship with a primary care physician if you do not have one) for your aftercare needs so that they can reassess your need  for medications and monitor your lab values.     Allergies as of 08/02/2017      Reactions   Penicillins Hives   Childhood allergy could have been more reaction only remembers hives.  07/23/17: Tolerated Zosyn      Medication List    TAKE these medications   amLODipine 5 MG tablet Commonly known as:  NORVASC Take 1 tablet (5 mg total) by mouth daily.   amphetamine-dextroamphetamine 20 MG tablet Commonly known as:  ADDERALL Take 20 mg by mouth daily with breakfast. Reported on 07/02/2016   ascorbic acid 500 MG tablet Commonly known as:  VITAMIN C Take 500 mg by mouth 3 (three) times daily.   atorvastatin 10 MG tablet Commonly known as:  LIPITOR Take 1 tablet (10 mg total) by mouth daily at 6 PM. What changed:  how much to take   CERTAGEN PO Take 1 tablet by mouth daily.   cloNIDine 0.2 MG tablet Commonly known as:  CATAPRES Take 0.2 mg by mouth 3 (three) times daily.   clopidogrel 75 MG tablet Commonly known as:  PLAVIX Take 75 mg by mouth daily.   diclofenac sodium 1 % Gel Commonly known as:  VOLTAREN Apply 4 g topically 4 (four) times daily.   DULoxetine 60 MG capsule Commonly known as:  CYMBALTA Take 60 mg by mouth daily.   enoxaparin 40 MG/0.4ML injection Commonly known as:  LOVENOX Inject 0.4 mLs (40 mg total) into the skin daily.   gabapentin 300 MG capsule Commonly known as:  NEURONTIN Take 1 capsule (300 mg total) by mouth 2 (two) times daily. What changed:  how much to take   HYDROcodone-acetaminophen 5-325 MG tablet Commonly known as:  NORCO/VICODIN Take 1 tablet by mouth every 6 (six) hours as needed for moderate pain. What changed:  how much to take  when to take this   insulin aspart 100 UNIT/ML injection Commonly known as:  novoLOG Inject 0-12 Units into the skin 4 (four) times  daily. Sliding scale-  <200= 0 units 201-250= 2 units 251-300= 4 units 301-350= 6 units 351-400=8 units 401-450= 10 units 451-500= 12 units >501= call MD     insulin detemir 100 UNIT/ML injection Commonly known as:  LEVEMIR Inject 0.5 mLs (50 Units total) into the skin at bedtime. What changed:  how much to take   ipratropium-albuterol 0.5-2.5 (3) MG/3ML Soln Commonly known as:  DUONEB Take 3 mLs by nebulization every 6 (six) hours as needed.   lamoTRIgine 100 MG tablet Commonly known as:  LAMICTAL Take 100 mg by mouth at bedtime.   lisinopril 20 MG tablet Commonly known as:  PRINIVIL,ZESTRIL Take 1 tablet (20 mg total) by mouth daily.   oxybutynin 5 MG tablet Commonly known as:  DITROPAN Take 5 mg by mouth at bedtime.   senna-docusate 8.6-50 MG tablet Commonly known as:  Senokot-S Take 1 tablet by mouth at bedtime as needed for mild constipation. What changed:  how much to take  when to take this   SYSTANE 0.4-0.3 % Soln Generic drug:  Polyethyl Glycol-Propyl Glycol Apply to eye.   torsemide 100 MG tablet Commonly known as:  DEMADEX Take 100 mg by mouth daily.         Diet and Activity recommendation: See Discharge Instructions above   Consults obtained -  ID   Major procedures and Radiology Reports - PLEASE review detailed and final reports for all details, in brief -     Dg Chest 2 View  Result Date: 07/23/2017 CLINICAL DATA:  Sepsis EXAM: CHEST  2 VIEW COMPARISON:  08/30/2009 FINDINGS: Mild cardiomegaly and vascular congestion. No confluent opacities or effusions. No acute bony abnormality. IMPRESSION: Mild cardiomegaly and vascular congestion. Electronically Signed   By: Rolm Baptise M.D.   On: 07/23/2017 11:08   Dg Ankle Complete Left  Result Date: 07/23/2017 CLINICAL DATA:  Left ankle pain. EXAM: LEFT ANKLE COMPLETE - 3+ VIEW COMPARISON:  Left ankle MRI dated December 04, 2016. FINDINGS: There is no evidence of fracture, dislocation, or joint effusion. The talar dome and ankle mortise are intact. Mild tibiotalar note degenerative changes. Small Achilles and plantar enthesophytes. Surgical clips noted in  the lower leg. Mild benign-appearing periosteal reaction along the fibula, nonspecific, but possibly due to venous insufficiency. Small soft tissue defect over the heel. IMPRESSION: 1. Small soft tissue defect over the heel. No radiographic evidence of osteomyelitis. Electronically Signed   By: Titus Dubin M.D.   On: 07/23/2017 12:57   Mr Pelvis W Wo Contrast  Result Date: 08/02/2017 CLINICAL DATA:  Fever with past medical history of chronic diastolic heart failure, diabetes and right below-knee amputation. Peripheral vascular disease. Complains of generalized body ache and chronic right hip pain. EXAM: MRI PELVIS WITHOUT AND WITH CONTRAST TECHNIQUE: Multiplanar multisequence MR imaging of the pelvis was performed both before and after administration of intravenous contrast. CONTRAST:  39m MULTIHANCE GADOBENATE DIMEGLUMINE 529 MG/ML IV SOLN COMPARISON:  None. FINDINGS: Urinary Tract: The bladder is physiologically distended without intramural mass or calculus. No hydroureter. Bowel:  Unremarkable visualized pelvic bowel loops. Vascular/Lymphatic: No pathologically enlarged lymph nodes. No significant vascular abnormality seen. Reproductive:  The uterus and adnexa are unremarkable. Other: Diffuse subcutaneous edema of the included lower abdomen and pelvis likely representing mild anasarca. Musculoskeletal: Intramuscular edema of the right gluteus minimus and medius muscles consistent with myositis, less likely strain. No hip joint effusion. No marrow signal abnormalities of the proximal femora. The femoral heads are spherical and well seated within their  acetabular components. SI joints and pubic symphysis are intact. No evidence of a stress or insufficiency fracture. No significant disc desiccation of the included lower lumbar spine. IMPRESSION: 1. Generalized soft tissue anasarca of the included abdomen and pelvis. 2. Intramuscular edema noted of the right gluteus minimus and medius muscles, raising  concern for a myositis or possibly intramuscular strain. 3. No abscess collections are identified. 4. No joint effusions. Electronically Signed   By: Ashley Royalty M.D.   On: 08/02/2017 01:41   Ct Abdomen Pelvis W Contrast  Result Date: 07/23/2017 CLINICAL DATA:  71 year old female with multiple medical problems, fever and whole-body aching. EXAM: CT ABDOMEN AND PELVIS WITH CONTRAST TECHNIQUE: Multidetector CT imaging of the abdomen and pelvis was performed using the standard protocol following bolus administration of intravenous contrast. CONTRAST:  53m ISOVUE-300 IOPAMIDOL (ISOVUE-300) INJECTION 61%, 831mISOVUE-300 IOPAMIDOL (ISOVUE-300) INJECTION 61% COMPARISON:  Most recent prior CT scan of the abdomen and pelvis 05/06/2009 FINDINGS: Lower chest: Patchy airspace opacity in a peribronchovascular distribution in the left lower lobe concerning for a combination of infiltrate and atelectasis. Minimal dependent atelectasis is present in the right lower lobe. Mild lower lobe bronchial wall thickening. No pleural effusion. Visualized cardiac structures are normal in size. No pericardial effusion. Unremarkable distal thoracic esophagus. Hepatobiliary: Ill-defined 1.4 cm low-attenuation subcapsular lesion in the anterior aspect of hepatic segment 2 which was not visible on prior studies. Otherwise, no discrete hepatic lesion. The gallbladder is small and contracted. High attenuation stones are present in the gallbladder neck. No intra or extrahepatic biliary ductal dilatation. Pancreas: Unremarkable. No pancreatic ductal dilatation or surrounding inflammatory changes. Spleen: Normal in size without focal abnormality. Adrenals/Urinary Tract: Unremarkable adrenal glands. No enhancing renal lesion, hydronephrosis or nephrolithiasis. 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 No significant  interval change other than slightly increased peripheral calcification compared to 2010. Unremarkable ureters and bladder. 1.3 cm low-attenuation lesion in the posterior aspect of the upper pole of the left kidney remains unchanged dating back to 2010 consistent with a benign cyst. Stomach/Bowel: There are at least 4 focal areas of concentric narrowing involving the sigmoid and distal descending colon. This almost certainly represents benign peristalsis given the sequential nature of the findings. Otherwise, no focal bowel wall thickening or evidence obstruction. Unremarkable terminal ileum. Normal appendix. Vascular/Lymphatic: Atherosclerotic calcifications throughout the abdominal aorta. No evidence of aneurysm. Suspect a high-grade stenosis of the left common iliac artery in the region of the iliac bifurcation. No suspicious lymphadenopathy. Reproductive: Globular uterus with internal calcifications likely representing old degenerated fibroids. No adnexal mass. Other: No abdominal wall hernia or abnormality. No abdominopelvic ascites. Musculoskeletal: No acute fracture or aggressive appearing lytic or blastic osseous lesion. IMPRESSION: 1. Left lower lobe patchy airspace opacity in a peribronchovascular distribution concerning for bronchopneumonia. 2. Ill-defined 1.4 cm low-attenuation subcapsular lesion in the anterior aspect of hepatic segment 2 was not present on prior imaging from 2010. Differential considerations include metastatic disease from an occult primary, interval enlargement of an atypical hemangioma, and potentially a very small hepatic abscess although the appearance is not classic. Recommend further evaluation with non emergent MRI of the abdomen with gadolinium contrast when the patient's acute illness has some stabilized. 3. Cholelithiasis. 4. 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 5.  Aortic Atherosclerosis (ICD10-170.0) 6. 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  7. Fibroid uterus. Electronically Signed   By: Jacqulynn Cadet M.D.   On: 07/23/2017 20:46   Mr Foot Left Wo Contrast  Result Date: 07/26/2017 CLINICAL DATA:  Open wound on the left heel in a diabetic patient. EXAM: MRI OF THE LEFT HINDFOOT WITHOUT CONTRAST TECHNIQUE: Multiplanar, multisequence MR imaging of the left hindfoot was performed. No intravenous contrast was administered. COMPARISON:  Plain films of the left ankle 07/23/2017. MRI left ankle 12/04/2016. FINDINGS: Bones/Joint/Cartilage A small focus of mild edema is seen in the lateral periphery of the calcaneus posteriorly and inferiorly. Bone marrow signal is otherwise normal. Ligaments Intact. Muscles and Tendons Intact. Soft tissues Intense subcutaneous edema is present about the ankle and dorsum of the foot. No focal fluid collection is identified. IMPRESSION: Small focus of mild marrow edema in the lateral periphery of the calcaneus could be due to osteomyelitis but given low intensity of signal reactive or stress change is favored. Intense subcutaneous edema about the ankle and visualized foot could be due to cellulitis or dependent change. No abscess is identified. Negative for septic joint or tenosynovitis. Electronically Signed   By: Inge Rise M.D.   On: 07/26/2017 17:09   Ir US Guide Vasc Access Right  Result Date: 07/29/2017 INDICATION: Sepsis. Request PICC line placement for prolonged IV antibiotic therapies. EXAM: ULTRASOUND AND FLUOROSCOPIC GUIDED PICC LINE INSERTION MEDICATIONS: None. CONTRAST:  None FLUOROSCOPY TIME:  36 seconds seconds COMPLICATIONS: None immediate. TECHNIQUE: The procedure, risks, benefits, and alternatives were explained to the patient and informed written consent was obtained. A timeout was performed prior to the initiation of the procedure. The right upper extremity was prepped with chlorhexidine in a sterile fashion, and a sterile drape was applied covering the  operative field. Maximum barrier sterile technique with sterile gowns and gloves were used for the procedure. A timeout was performed prior to the initiation of the procedure. Local anesthesia was provided with 1% lidocaine. Under direct ultrasound guidance, the basilic vein was accessed with a micropuncture kit after the overlying soft tissues were anesthetized with 1% lidocaine. After the overlying soft tissues were anesthetized, a small venotomy incision was created and a micropuncture kit was utilized to access the right basilic vein. Real-time ultrasound guidance was utilized for vascular access including the acquisition of a permanent ultrasound image documenting patency of the accessed vessel. A guidewire was advanced to the level of the superior caval-atrial junction for measurement purposes and the PICC line was cut to length. A peel-away sheath was placed and a 40 cm, 5 [place], dual lumen was inserted to level of the superior caval-atrial junction. A post procedure spot fluoroscopic was obtained. The catheter easily aspirated and flushed and was sutured in place. A dressing was placed. The patient tolerated the procedure well without immediate post procedural complication. FINDINGS: After catheter placement, the tip lies within the superior cavoatrial junction. The catheter aspirates and flushes normally and is ready for immediate use. IMPRESSION: Successful ultrasound and fluoroscopic guided placement of a right basilic vein approach, 40 cm, 5 French, dual lumen PICC with tip at the superior caval-atrial junction. The PICC line is ready for immediate use. Read by: Ascencion Dike PA-C Electronically Signed   By: Corrie Mckusick D.O.   On: 07/29/2017 12:54   Dg Chest Port 1 View  Result Date: 07/30/2017 CLINICAL DATA:  Patient admitted 07/23/2017 with sepsis. Shortness of breath today. EXAM: PORTABLE CHEST 1 VIEW COMPARISON:  Single-view of the chest 07/28/2017 and PA and lateral chest 07/23/2017. FINDINGS:  Bilateral airspace disease persists but aeration is improved since the most recent examination. Heart size is upper normal. There are likely small bilateral pleural effusions. No pneumothorax. The patient has a new right PICC with the tip now in the mid to lower superior vena cava. IMPRESSION: Bilateral airspace disease compatible with multifocal pneumonia persists but has improved. Right PICC in good position. No new abnormality. Electronically Signed   By: Inge Rise M.D.   On: 07/30/2017 07:18   Dg Chest Port 1 View  Result Date: 07/28/2017 CLINICAL DATA:  PICC line positioning. EXAM: PORTABLE CHEST 1 VIEW COMPARISON:  07/23/2017 FINDINGS: Right upper extremity PICC line is been placed. This ascends in the neck within the right internal jugular vein. The tip is not visualized. Lungs show significant worsening in aeration with diffuse airspace disease noted in the right upper and lower lung zones and airspace disease also in the left lower lung. Findings are concerning for multifocal pneumonia. There may also be a component of interstitial edema. No significant pleural fluid identified. IMPRESSION: 1. Right-sided PICC line ascends within the right internal jugular vein. 2. Significant worsening of pulmonary aeration with now significant airspace disease noted in the right upper and lower lungs as well as the left lower lung. Findings are concerning for multifocal pneumonia. There may also be a component of interstitial edema. Electronically Signed   By: Aletta Edouard M.D.   On: 07/28/2017 22:38   Ir Fluoro Guide Cv Midline Picc Right  Result Date: 07/29/2017 INDICATION: Sepsis. Request PICC line placement for prolonged IV antibiotic therapies. EXAM: ULTRASOUND AND FLUOROSCOPIC GUIDED PICC LINE INSERTION MEDICATIONS: None. CONTRAST:  None FLUOROSCOPY TIME:  36 seconds seconds COMPLICATIONS: None immediate. TECHNIQUE: The procedure, risks, benefits, and alternatives were explained to the patient and  informed written consent was obtained. A timeout was performed prior to the initiation of the procedure. The right upper extremity was prepped with chlorhexidine in a sterile fashion, and a sterile drape was applied covering the operative field. Maximum barrier sterile technique with sterile gowns and gloves were used for the procedure. A timeout was performed prior to the initiation of the procedure. Local anesthesia was provided with 1% lidocaine. Under direct ultrasound guidance, the basilic vein was accessed with a micropuncture kit after the overlying soft tissues were anesthetized with 1% lidocaine. After the overlying soft tissues were anesthetized, a small venotomy incision was created and a micropuncture kit was utilized to access the right basilic vein. Real-time ultrasound guidance was utilized for vascular access including the acquisition of a permanent ultrasound image documenting patency of the accessed vessel. A guidewire was advanced to the level of the superior caval-atrial junction for measurement purposes and the PICC line was cut to length. A peel-away sheath was placed and a 40 cm, 5 [place], dual lumen was inserted to level of the superior caval-atrial junction. A post procedure spot fluoroscopic was obtained. The catheter easily aspirated and flushed and was sutured in place. A dressing was placed. The patient tolerated the procedure well without immediate post procedural complication. FINDINGS: After catheter placement, the tip lies within the superior cavoatrial junction. The catheter aspirates and flushes normally and is ready for immediate use. IMPRESSION: Successful ultrasound and fluoroscopic guided placement of a right basilic vein approach, 40 cm, 5 French, dual lumen PICC with tip at the superior caval-atrial junction. The PICC line is ready for immediate use. Read by: Ascencion Dike PA-C Electronically Signed   By: Corrie Mckusick D.O.   On: 07/29/2017  12:54    Micro Results      Recent Results (from the past 240 hour(s))  Urine culture     Status: None   Collection Time: 07/23/17 11:51 AM  Result Value Ref Range Status   Specimen Description URINE, CATHETERIZED  Final   Special Requests NONE  Final   Culture   Final    NO GROWTH Performed at [facility], [address], [place], [place] [address]    Report Status 07/24/2017 FINAL  Final  Blood Culture (routine x 2)     Status: None   Collection Time: 07/23/17  4:39 PM  Result Value Ref Range Status   Specimen Description BLOOD BLOOD RIGHT HAND  Final   Special Requests IN PEDIATRIC BOTTLE Blood Culture adequate volume  Final   Culture   Final    NO GROWTH 5 DAYS Performed at [facility], [place] [address] [place], [place] [address]    Report Status 07/28/2017 FINAL  Final  MRSA PCR Screening     Status: Abnormal   Collection Time: 07/24/17 11:47 PM  Result Value Ref Range Status   MRSA by PCR INVALID RESULTS, SPECIMEN SENT FOR CULTURE (A) NEGATIVE Final    Comment:        The GeneXpert MRSA Assay (FDA approved for NASAL specimens only), is one component of a comprehensive MRSA colonization surveillance program. It is not intended to diagnose MRSA infection nor to guide or monitor treatment for MRSA infections. RESULT CALLED TO, READ BACK BY AND VERIFIED WITH: D BROWN AT 0017 ON 08.04.2018 BY NBROOKS   MRSA PCR Screening     Status: Abnormal   Collection Time: 07/25/17  9:21 AM  Result Value Ref Range Status   MRSA by PCR INVALID RESULTS, SPECIMEN SENT FOR CULTURE (A) NEGATIVE Final    Comment: RESULT CALLED TO, READ BACK BY AND VERIFIED WITH: [place], S AT [place] ON 07/25/17 BY INGLEE        The GeneXpert MRSA Assay (FDA approved for NASAL specimens only), is one component of a comprehensive MRSA colonization surveillance program. It is not intended to diagnose MRSA infection nor to guide or monitor treatment for MRSA infections.   MRSA culture     Status: None    Collection Time: 07/25/17 10:12 AM  Result Value Ref Range Status   Specimen Description NOSE  Final   Special Requests NONE  Final   Culture   Final    NO MRSA DETECTED Performed at [facility], [address], [place], [place] [address]    Report Status 07/27/2017 FINAL  Final  Culture, blood (routine x 2)     Status: None   Collection Time: 07/25/17  6:07 PM  Result Value Ref Range Status   Specimen Description BLOOD BLOOD LEFT HAND  Final   Special Requests IN PEDIATRIC BOTTLE Blood Culture adequate volume  Final   Culture   Final    NO GROWTH 5 DAYS Performed at [facility], [place] [address] [place],  [address]    Report Status 07/31/2017 FINAL  Final  Culture, blood (routine x 2)     Status: None   Collection Time: 07/25/17  6:07 PM  Result Value Ref Range Status   Specimen Description BLOOD BLOOD RIGHT HAND  Final   Special Requests IN PEDIATRIC BOTTLE Blood Culture adequate volume  Final   Culture   Final    NO GROWTH 5 DAYS Performed at [facility], [place] [address], [place], [place] [address]    Report Status 07/31/2017 FINAL  Final  Culture, blood (routine x 2)     Status: None (Preliminary result)   Collection Time: 07/29/17 12:31 AM  Result Value Ref Range Status   Specimen Description BLOOD RIGHT HAND  Final   Special Requests IN PEDIATRIC BOTTLE Blood Culture adequate volume  Final   Culture   Final    NO GROWTH 3 DAYS Performed at [facility], [place] [address] [place], [place] [address]    Report Status PENDING  Incomplete  Culture, blood (routine x 2)     Status: None (Preliminary result)   Collection Time: 07/29/17  5:20 AM  Result Value Ref Range Status   Specimen Description BLOOD BLOOD RIGHT HAND  Final   Special Requests IN PEDIATRIC BOTTLE Blood Culture adequate volume  Final   Culture   Final    NO GROWTH 3 DAYS Performed at [facility], [place] [address] [place], [place] [address]    Report Status PENDING   Incomplete       Today   Subjective:   Jameria Bradway today has no headache,no chest or  abdominal pain,no new weakness tingling or numbness, feels much better today.   Objective:   Blood pressure (!) 145/65, pulse 73, temperature 99 F (37.2 C), temperature source Oral, resp. rate 18, height 5' 7"  (1.702 m), weight 100.7 kg (222 lb 0.1 oz), SpO2 92 %.   Intake/Output Summary (Last 24 hours) at 08/02/17 1102 Last data filed at 08/02/17 0900  Gross per 24 hour  Intake              240 ml  Output             1800 ml  Net            -1560 ml    Exam General exam: Awake alert 3, no apparent distress Respiratory system: Good air entry bilaterally, clear to auscultation  Cardiovascular system: S1 & S2 heard, regular rate and rhythm, no rubs murmurs gallops. Gastrointestinal system: soft, nontender, nondistended, bowel sounds present Central nervous system: No focal neurological deficits. Extremities: Left lower extremity with no evidence of cellulitis or erythema,Right-sided BKA noted.  Data Review   CBC w Diff: Lab Results  Component Value Date   WBC 9.5 08/01/2017   HGB 8.9 (L) 08/01/2017   HCT 27.8 (L) 08/01/2017   PLT 397 08/01/2017   LYMPHOPCT 10 07/29/2017   MONOPCT 8 07/29/2017   EOSPCT 1 07/29/2017   BASOPCT 0 07/29/2017    CMP: Lab Results  Component Value Date   NA 140 08/01/2017   K 3.3 (L) 08/01/2017   CL 105 08/01/2017   CO2 28 08/01/2017   BUN 8 08/01/2017   CREATININE 0.76 08/01/2017   PROT 6.5 07/28/2017   ALBUMIN 2.0 (L) 07/28/2017   BILITOT 0.5 07/28/2017   ALKPHOS 77 07/28/2017   AST 19 07/28/2017   ALT 15 07/28/2017  .   Total Time in preparing paper work, data evaluation and todays exam - 35 minutes  Myrtice Lowdermilk M.D on 08/02/2017 at 11:02 AM  [facility]   Office  [phone]

## 2017-08-02 NOTE — Discharge Instructions (Signed)
Follow with Primary MD Rometta EmeryGarba, Mohammad L, MD in 7 days   Get CBC, CMP, 2 view Chest X ray checked  by Primary MD next visit.    Activity: Per PT recommendation  Disposition SNF   Diet: Heart Healthy , carbohydrate modified with feeding assistance and aspiration precautions.  For Heart failure patients - Check your Weight same time everyday, if you gain over 2 pounds, or you develop in leg swelling, experience more shortness of breath or chest pain, call your Primary MD immediately. Follow Cardiac Low Salt Diet and 1.5 lit/day fluid restriction.   On your next visit with your primary care physician please Get Medicines reviewed and adjusted.   Please request your Prim.MD to go over all Hospital Tests and Procedure/Radiological results at the follow up, please get all Hospital records sent to your Prim MD by signing hospital release before you go home.   If you experience worsening of your admission symptoms, develop shortness of breath, life threatening emergency, suicidal or homicidal thoughts you must seek medical attention immediately by calling 911 or calling your MD immediately  if symptoms less severe.  You Must read complete instructions/literature along with all the possible adverse reactions/side effects for all the Medicines you take and that have been prescribed to you. Take any new Medicines after you have completely understood and accpet all the possible adverse reactions/side effects.   Do not drive, operating heavy machinery, perform activities at heights, swimming or participation in water activities or provide baby sitting services if your were admitted for syncope or siezures until you have seen by Primary MD or a Neurologist and advised to do so again.  Do not drive when taking Pain medications.    Do not take more than prescribed Pain, Sleep and Anxiety Medications  Special Instructions: If you have smoked or chewed Tobacco  in the last 2 yrs please stop smoking,  stop any regular Alcohol  and or any Recreational drug use.  Wear Seat belts while driving.   Please note  You were cared for by a hospitalist during your hospital stay. If you have any questions about your discharge medications or the care you received while you were in the hospital after you are discharged, you can call the unit and asked to speak with the hospitalist on call if the hospitalist that took care of you is not available. Once you are discharged, your primary care physician will handle any further medical issues. Please note that NO REFILLS for any discharge medications will be authorized once you are discharged, as it is imperative that you return to your primary care physician (or establish a relationship with a primary care physician if you do not have one) for your aftercare needs so that they can reassess your need for medications and monitor your lab values.

## 2017-08-03 ENCOUNTER — Institutional Professional Consult (permissible substitution): Payer: Medicare Other | Admitting: Neurology

## 2017-08-03 LAB — CULTURE, BLOOD (ROUTINE X 2)
Culture: NO GROWTH
Culture: NO GROWTH
SPECIAL REQUESTS: ADEQUATE
Special Requests: ADEQUATE

## 2017-08-04 ENCOUNTER — Encounter: Payer: Self-pay | Admitting: Neurology

## 2017-08-07 DIAGNOSIS — R509 Fever, unspecified: Secondary | ICD-10-CM

## 2017-08-07 DIAGNOSIS — R0602 Shortness of breath: Secondary | ICD-10-CM

## 2017-11-24 ENCOUNTER — Inpatient Hospital Stay (HOSPITAL_COMMUNITY): Payer: Medicare Other

## 2017-11-24 ENCOUNTER — Encounter (HOSPITAL_COMMUNITY): Payer: Self-pay

## 2017-11-24 ENCOUNTER — Emergency Department (HOSPITAL_COMMUNITY): Payer: Medicare Other

## 2017-11-24 ENCOUNTER — Inpatient Hospital Stay (HOSPITAL_COMMUNITY)
Admission: EM | Admit: 2017-11-24 | Discharge: 2017-12-01 | DRG: 853 | Disposition: A | Discharge status: Skilled Nursing Facility | Payer: Medicare Other | Attending: Internal Medicine | Admitting: Internal Medicine

## 2017-11-24 DIAGNOSIS — G9341 Metabolic encephalopathy: Secondary | ICD-10-CM | POA: Diagnosis present

## 2017-11-24 DIAGNOSIS — N189 Chronic kidney disease, unspecified: Secondary | ICD-10-CM | POA: Diagnosis present

## 2017-11-24 DIAGNOSIS — Z89511 Acquired absence of right leg below knee: Secondary | ICD-10-CM | POA: Diagnosis not present

## 2017-11-24 DIAGNOSIS — N179 Acute kidney failure, unspecified: Secondary | ICD-10-CM | POA: Diagnosis present

## 2017-11-24 DIAGNOSIS — J181 Lobar pneumonia, unspecified organism: Secondary | ICD-10-CM | POA: Diagnosis present

## 2017-11-24 DIAGNOSIS — I96 Gangrene, not elsewhere classified: Secondary | ICD-10-CM | POA: Diagnosis present

## 2017-11-24 DIAGNOSIS — Z794 Long term (current) use of insulin: Secondary | ICD-10-CM | POA: Diagnosis not present

## 2017-11-24 DIAGNOSIS — I70244 Atherosclerosis of native arteries of left leg with ulceration of heel and midfoot: Secondary | ICD-10-CM | POA: Diagnosis not present

## 2017-11-24 DIAGNOSIS — R05 Cough: Secondary | ICD-10-CM

## 2017-11-24 DIAGNOSIS — N390 Urinary tract infection, site not specified: Secondary | ICD-10-CM | POA: Diagnosis present

## 2017-11-24 DIAGNOSIS — D631 Anemia in chronic kidney disease: Secondary | ICD-10-CM | POA: Diagnosis present

## 2017-11-24 DIAGNOSIS — E876 Hypokalemia: Secondary | ICD-10-CM | POA: Diagnosis present

## 2017-11-24 DIAGNOSIS — E1152 Type 2 diabetes mellitus with diabetic peripheral angiopathy with gangrene: Secondary | ICD-10-CM | POA: Diagnosis present

## 2017-11-24 DIAGNOSIS — I959 Hypotension, unspecified: Secondary | ICD-10-CM | POA: Diagnosis present

## 2017-11-24 DIAGNOSIS — I130 Hypertensive heart and chronic kidney disease with heart failure and stage 1 through stage 4 chronic kidney disease, or unspecified chronic kidney disease: Secondary | ICD-10-CM | POA: Diagnosis present

## 2017-11-24 DIAGNOSIS — L89629 Pressure ulcer of left heel, unspecified stage: Secondary | ICD-10-CM | POA: Diagnosis present

## 2017-11-24 DIAGNOSIS — E870 Hyperosmolality and hypernatremia: Secondary | ICD-10-CM | POA: Diagnosis not present

## 2017-11-24 DIAGNOSIS — B9620 Unspecified Escherichia coli [E. coli] as the cause of diseases classified elsewhere: Secondary | ICD-10-CM | POA: Diagnosis present

## 2017-11-24 DIAGNOSIS — E860 Dehydration: Secondary | ICD-10-CM | POA: Diagnosis present

## 2017-11-24 DIAGNOSIS — N170 Acute kidney failure with tubular necrosis: Secondary | ICD-10-CM | POA: Diagnosis present

## 2017-11-24 DIAGNOSIS — L97529 Non-pressure chronic ulcer of other part of left foot with unspecified severity: Secondary | ICD-10-CM

## 2017-11-24 DIAGNOSIS — E1122 Type 2 diabetes mellitus with diabetic chronic kidney disease: Secondary | ICD-10-CM | POA: Diagnosis present

## 2017-11-24 DIAGNOSIS — Z79899 Other long term (current) drug therapy: Secondary | ICD-10-CM

## 2017-11-24 DIAGNOSIS — L89152 Pressure ulcer of sacral region, stage 2: Secondary | ICD-10-CM | POA: Diagnosis present

## 2017-11-24 DIAGNOSIS — E785 Hyperlipidemia, unspecified: Secondary | ICD-10-CM | POA: Diagnosis present

## 2017-11-24 DIAGNOSIS — R059 Cough, unspecified: Secondary | ICD-10-CM

## 2017-11-24 DIAGNOSIS — G47419 Narcolepsy without cataplexy: Secondary | ICD-10-CM | POA: Diagnosis present

## 2017-11-24 DIAGNOSIS — D638 Anemia in other chronic diseases classified elsewhere: Secondary | ICD-10-CM | POA: Diagnosis present

## 2017-11-24 DIAGNOSIS — Z95828 Presence of other vascular implants and grafts: Secondary | ICD-10-CM

## 2017-11-24 DIAGNOSIS — IMO0001 Reserved for inherently not codable concepts without codable children: Secondary | ICD-10-CM

## 2017-11-24 DIAGNOSIS — I5032 Chronic diastolic (congestive) heart failure: Secondary | ICD-10-CM | POA: Diagnosis present

## 2017-11-24 DIAGNOSIS — E1140 Type 2 diabetes mellitus with diabetic neuropathy, unspecified: Secondary | ICD-10-CM | POA: Diagnosis present

## 2017-11-24 DIAGNOSIS — Z993 Dependence on wheelchair: Secondary | ICD-10-CM | POA: Diagnosis not present

## 2017-11-24 DIAGNOSIS — E7800 Pure hypercholesterolemia, unspecified: Secondary | ICD-10-CM | POA: Diagnosis present

## 2017-11-24 DIAGNOSIS — E119 Type 2 diabetes mellitus without complications: Secondary | ICD-10-CM

## 2017-11-24 DIAGNOSIS — I739 Peripheral vascular disease, unspecified: Secondary | ICD-10-CM | POA: Diagnosis present

## 2017-11-24 DIAGNOSIS — Z7401 Bed confinement status: Secondary | ICD-10-CM | POA: Diagnosis not present

## 2017-11-24 DIAGNOSIS — I10 Essential (primary) hypertension: Secondary | ICD-10-CM | POA: Diagnosis present

## 2017-11-24 DIAGNOSIS — J189 Pneumonia, unspecified organism: Secondary | ICD-10-CM | POA: Diagnosis present

## 2017-11-24 DIAGNOSIS — A419 Sepsis, unspecified organism: Principal | ICD-10-CM | POA: Diagnosis present

## 2017-11-24 DIAGNOSIS — E11621 Type 2 diabetes mellitus with foot ulcer: Secondary | ICD-10-CM | POA: Diagnosis present

## 2017-11-24 DIAGNOSIS — E10621 Type 1 diabetes mellitus with foot ulcer: Secondary | ICD-10-CM | POA: Diagnosis present

## 2017-11-24 DIAGNOSIS — Z87891 Personal history of nicotine dependence: Secondary | ICD-10-CM

## 2017-11-24 LAB — RETICULOCYTES
RBC.: 2.8 MIL/uL — ABNORMAL LOW (ref 3.87–5.11)
Retic Count, Absolute: 19.6 10*3/uL (ref 19.0–186.0)
Retic Ct Pct: 0.7 % (ref 0.4–3.1)

## 2017-11-24 LAB — URINALYSIS, ROUTINE W REFLEX MICROSCOPIC
Bilirubin Urine: NEGATIVE
GLUCOSE, UA: NEGATIVE mg/dL
KETONES UR: NEGATIVE mg/dL
NITRITE: NEGATIVE
PROTEIN: 100 mg/dL — AB
Specific Gravity, Urine: 1.008 (ref 1.005–1.030)
Squamous Epithelial / LPF: NONE SEEN
pH: 6 (ref 5.0–8.0)

## 2017-11-24 LAB — COMPREHENSIVE METABOLIC PANEL
ALT: 22 U/L (ref 14–54)
AST: 50 U/L — AB (ref 15–41)
Albumin: 3 g/dL — ABNORMAL LOW (ref 3.5–5.0)
Alkaline Phosphatase: 92 U/L (ref 38–126)
Anion gap: 22 — ABNORMAL HIGH (ref 5–15)
BUN: 174 mg/dL — AB (ref 6–20)
CHLORIDE: 92 mmol/L — AB (ref 101–111)
CO2: 21 mmol/L — ABNORMAL LOW (ref 22–32)
Calcium: 8.8 mg/dL — ABNORMAL LOW (ref 8.9–10.3)
Creatinine, Ser: 5.17 mg/dL — ABNORMAL HIGH (ref 0.44–1.00)
GFR, EST AFRICAN AMERICAN: 9 mL/min — AB (ref >60)
GFR, EST NON AFRICAN AMERICAN: 8 mL/min — AB (ref >60)
Glucose, Bld: 170 mg/dL — ABNORMAL HIGH (ref 65–99)
POTASSIUM: 3.8 mmol/L (ref 3.5–5.1)
Sodium: 135 mmol/L (ref 135–145)
Total Bilirubin: 0.8 mg/dL (ref 0.3–1.2)
Total Protein: 8.1 g/dL (ref 6.5–8.1)

## 2017-11-24 LAB — FERRITIN: FERRITIN: 85 ng/mL (ref 11–307)

## 2017-11-24 LAB — PROTIME-INR
INR: 1.21
PROTHROMBIN TIME: 15.2 s (ref 11.4–15.2)

## 2017-11-24 LAB — I-STAT CG4 LACTIC ACID, ED: Lactic Acid, Venous: 2.3 mmol/L (ref 0.5–1.9)

## 2017-11-24 LAB — CBC
HCT: 34.2 % — ABNORMAL LOW (ref 36.0–46.0)
Hemoglobin: 11.4 g/dL — ABNORMAL LOW (ref 12.0–15.0)
MCH: 29.8 pg (ref 26.0–34.0)
MCHC: 33.3 g/dL (ref 30.0–36.0)
MCV: 89.5 fL (ref 78.0–100.0)
PLATELETS: 355 10*3/uL (ref 150–400)
RBC: 3.82 MIL/uL — ABNORMAL LOW (ref 3.87–5.11)
RDW: 13.6 % (ref 11.5–15.5)
WBC: 10.8 10*3/uL — AB (ref 4.0–10.5)

## 2017-11-24 LAB — APTT: aPTT: 41 seconds — ABNORMAL HIGH (ref 24–36)

## 2017-11-24 LAB — HEMOGLOBIN A1C
Hgb A1c MFr Bld: 9.5 % — ABNORMAL HIGH (ref 4.8–5.6)
Mean Plasma Glucose: 225.95 mg/dL

## 2017-11-24 LAB — CBG MONITORING, ED
Glucose-Capillary: 175 mg/dL — ABNORMAL HIGH (ref 65–99)
Glucose-Capillary: 155 mg/dL — ABNORMAL HIGH (ref 65–99)

## 2017-11-24 LAB — FOLATE: Folate: 56.7 ng/mL (ref >5.9)

## 2017-11-24 LAB — IRON AND TIBC
IRON: 28 ug/dL (ref 28–170)
Saturation Ratios: 11 % (ref 10.4–31.8)
TIBC: 265 ug/dL (ref 250–450)
UIBC: 237 ug/dL

## 2017-11-24 LAB — LACTIC ACID, PLASMA
LACTIC ACID, VENOUS: 1 mmol/L (ref 0.5–1.9)
LACTIC ACID, VENOUS: 1.1 mmol/L (ref 0.5–1.9)

## 2017-11-24 LAB — SEDIMENTATION RATE: Sed Rate: >140 mm/hr — ABNORMAL HIGH (ref 0–22)

## 2017-11-24 LAB — CORTISOL: CORTISOL PLASMA: 21 ug/dL

## 2017-11-24 LAB — VITAMIN B12: Vitamin B-12: 1031 pg/mL — ABNORMAL HIGH (ref 180–914)

## 2017-11-24 LAB — TSH: TSH: 0.091 u[IU]/mL — AB (ref 0.350–4.500)

## 2017-11-24 LAB — CK: Total CK: 3946 U/L — ABNORMAL HIGH (ref 38–234)

## 2017-11-24 LAB — PROCALCITONIN: PROCALCITONIN: 0.46 ng/mL

## 2017-11-24 MED ORDER — SODIUM CHLORIDE 0.9 % IV BOLUS (SEPSIS)
500.0000 mL | Freq: Once | INTRAVENOUS | Status: AC
  Administered 2017-11-24: 500 mL via INTRAVENOUS

## 2017-11-24 MED ORDER — SODIUM CHLORIDE 0.9% FLUSH
3.0000 mL | Freq: Two times a day (BID) | INTRAVENOUS | Status: DC
  Administered 2017-11-24 – 2017-12-01 (×12): 3 mL via INTRAVENOUS

## 2017-11-24 MED ORDER — CLOPIDOGREL BISULFATE 75 MG PO TABS
75.0000 mg | ORAL_TABLET | Freq: Every day | ORAL | Status: DC
  Administered 2017-11-25 – 2017-12-01 (×6): 75 mg via ORAL
  Filled 2017-11-24 (×6): qty 1

## 2017-11-24 MED ORDER — DEXTROSE 5 % IV SOLN
500.0000 mg | INTRAVENOUS | Status: DC
  Filled 2017-11-24: qty 0.5

## 2017-11-24 MED ORDER — ONDANSETRON HCL 4 MG/2ML IJ SOLN: 4.0000 mg | Freq: Four times a day (QID) | INTRAMUSCULAR | Status: DC | PRN

## 2017-11-24 MED ORDER — HEPARIN SODIUM (PORCINE) 5000 UNIT/ML IJ SOLN
5000.0000 [IU] | Freq: Three times a day (TID) | INTRAMUSCULAR | Status: DC
  Administered 2017-11-24 – 2017-12-01 (×22): 5000 [IU] via SUBCUTANEOUS
  Filled 2017-11-24 (×22): qty 1

## 2017-11-24 MED ORDER — AMPHETAMINE-DEXTROAMPHETAMINE 10 MG PO TABS
30.0000 mg | ORAL_TABLET | Freq: Every day | ORAL | Status: DC
Start: 2017-11-25 — End: 2017-11-26
  Administered 2017-11-25 – 2017-11-26 (×2): 30 mg via ORAL
  Filled 2017-11-24 (×2): qty 3

## 2017-11-24 MED ORDER — DEXTROSE 5 % IV SOLN
2.0000 g | Freq: Once | INTRAVENOUS | Status: AC
  Administered 2017-11-24: 2 g via INTRAVENOUS
  Filled 2017-11-24: qty 2

## 2017-11-24 MED ORDER — ACETAMINOPHEN 650 MG RE SUPP: 650.0000 mg | Freq: Four times a day (QID) | RECTAL | Status: DC | PRN

## 2017-11-24 MED ORDER — SODIUM CHLORIDE 0.9 % IV BOLUS (SEPSIS)
1000.0000 mL | Freq: Once | INTRAVENOUS | Status: AC
  Administered 2017-11-24: 1000 mL via INTRAVENOUS

## 2017-11-24 MED ORDER — FAMOTIDINE 20 MG PO TABS
20.0000 mg | ORAL_TABLET | Freq: Every day | ORAL | Status: DC
Start: 2017-11-24 — End: 2017-12-02
  Administered 2017-11-24 – 2017-12-01 (×7): 20 mg via ORAL
  Filled 2017-11-24 (×7): qty 1

## 2017-11-24 MED ORDER — DULOXETINE HCL 60 MG PO CPEP
60.0000 mg | ORAL_CAPSULE | Freq: Every day | ORAL | Status: DC
Start: 2017-11-25 — End: 2017-12-02
  Administered 2017-11-25 – 2017-12-01 (×6): 60 mg via ORAL
  Filled 2017-11-24 (×6): qty 1

## 2017-11-24 MED ORDER — SODIUM CHLORIDE 0.9 % IV BOLUS (SEPSIS)
30.0000 mL/kg | Freq: Once | INTRAVENOUS | Status: AC
  Administered 2017-11-24: 3021 mL via INTRAVENOUS

## 2017-11-24 MED ORDER — SODIUM CHLORIDE 0.9 % IV BOLUS (SEPSIS): 1000.0000 mL | Freq: Once | INTRAVENOUS | Status: DC

## 2017-11-24 MED ORDER — IPRATROPIUM-ALBUTEROL 0.5-2.5 (3) MG/3ML IN SOLN: 3.0000 mL | Freq: Four times a day (QID) | RESPIRATORY_TRACT | Status: DC | PRN

## 2017-11-24 MED ORDER — ONDANSETRON HCL 4 MG PO TABS: 4.0000 mg | ORAL_TABLET | Freq: Four times a day (QID) | ORAL | Status: DC | PRN

## 2017-11-24 MED ORDER — ACETAMINOPHEN 325 MG PO TABS
650.0000 mg | ORAL_TABLET | Freq: Four times a day (QID) | ORAL | Status: DC | PRN
  Administered 2017-11-25 – 2017-11-27 (×4): 650 mg via ORAL
  Filled 2017-11-24 (×5): qty 2

## 2017-11-24 MED ORDER — SODIUM CHLORIDE 0.9 % IV SOLN
INTRAVENOUS | Status: DC
  Administered 2017-11-24 – 2017-11-26 (×4): via INTRAVENOUS

## 2017-11-24 MED ORDER — SODIUM CHLORIDE 0.9 % IV SOLN: 1000.0000 mL | INTRAVENOUS | Status: DC

## 2017-11-24 MED ORDER — INSULIN ASPART 100 UNIT/ML ~~LOC~~ SOLN
0.0000 [IU] | SUBCUTANEOUS | Status: DC
  Administered 2017-11-24 – 2017-11-25 (×6): 3 [IU] via SUBCUTANEOUS
  Administered 2017-11-25: 2 [IU] via SUBCUTANEOUS
  Administered 2017-11-26: 3 [IU] via SUBCUTANEOUS
  Administered 2017-11-26: 2 [IU] via SUBCUTANEOUS
  Administered 2017-11-26 (×3): 3 [IU] via SUBCUTANEOUS
  Administered 2017-11-26: 5 [IU] via SUBCUTANEOUS
  Administered 2017-11-27 (×2): 3 [IU] via SUBCUTANEOUS
  Administered 2017-11-27: 2 [IU] via SUBCUTANEOUS
  Administered 2017-11-27 (×2): 3 [IU] via SUBCUTANEOUS
  Administered 2017-11-28: 2 [IU] via SUBCUTANEOUS
  Administered 2017-11-28: 3 [IU] via SUBCUTANEOUS
  Administered 2017-11-29: 2 [IU] via SUBCUTANEOUS
  Administered 2017-11-29: 5 [IU] via SUBCUTANEOUS
  Administered 2017-11-29 (×2): 2 [IU] via SUBCUTANEOUS
  Administered 2017-11-30: 3 [IU] via SUBCUTANEOUS
  Administered 2017-11-30: 2 [IU] via SUBCUTANEOUS
  Administered 2017-11-30: 3 [IU] via SUBCUTANEOUS
  Administered 2017-12-01: 2 [IU] via SUBCUTANEOUS
  Administered 2017-12-01: 3 [IU] via SUBCUTANEOUS
  Administered 2017-12-01 (×2): 2 [IU] via SUBCUTANEOUS
  Filled 2017-11-24: qty 1

## 2017-11-24 MED ORDER — VANCOMYCIN HCL 10 G IV SOLR
2000.0000 mg | INTRAVENOUS | Status: AC
  Administered 2017-11-24: 2000 mg via INTRAVENOUS
  Filled 2017-11-24: qty 2000

## 2017-11-24 NOTE — ED Triage Notes (Signed)
Pt arrived via [facility] from [facility] c/o altered mental status since last Friday went to PCP and was notified of abnormal kidney function labs.  Alert to self only.  EMS gave 300ml NS.  Chronic right hip pain.

## 2017-11-24 NOTE — Consult Note (Signed)
[facility] Renal Consultation Note  Requesting MD: Emokpae Indication for Consultation: AKI  HPI:  Laurie [name] is a 71 y.o. female with past medical history significant for hypertension, diabetes mellitus and history of stroke, right BKA.  She resides at [facility].  She was brought into the emergency department for 4 days of altered mental status and abnormal labs.  She has baseline normal kidney function with a creatinine of 0.76 in August of this year.  She is noted to have an extensive blood pressure regimen as an outpatient including Norvasc, clonidine, lisinopril 20, metolazone 10 mg 3 times a week as well as Demadex 100 daily-unclear why she would need so many diuretics as last echocardiogram showed normal EF and no right-sided issues-only grade 1 diastolic dysfunction.  Presenting blood pressure was 63/20, with fluid up to 92/61 -getting fluids. labs here showed a BUN of 174 and creatinine of 5.17 with a potassium of 3.8 and bicarb of 21, albumin 3.0.  Hemoglobin 11.4 and a lactate of 2.3.  Patient is currently getting a renal ultrasound.  She has received 3 L of fluid but no urine output as of yet  Creatinine, Ser  Date/Time Value Ref Range Status  11/24/2017 12:47 PM 5.17 (H) 0.44 - 1.00 mg/dL Final  08/01/2017 04:58 AM 0.76 0.44 - 1.00 mg/dL Final  07/30/2017 08:27 AM 0.79 0.44 - 1.00 mg/dL Final  07/28/2017 08:14 AM 0.91 0.44 - 1.00 mg/dL Final  07/27/2017 10:58 PM 0.90 0.44 - 1.00 mg/dL Final  07/27/2017 10:03 AM 0.97 0.44 - 1.00 mg/dL Final  07/26/2017 05:10 AM 0.92 0.44 - 1.00 mg/dL Final  07/25/2017 05:15 AM 1.01 (H) 0.44 - 1.00 mg/dL Final  07/24/2017 01:25 AM 1.07 (H) 0.44 - 1.00 mg/dL Final  07/23/2017 10:49 AM 1.17 (H) 0.44 - 1.00 mg/dL Final  07/23/2017 10:48 AM 1.10 (H) 0.44 - 1.00 mg/dL Final  12/06/2016 05:26 AM 0.87 0.44 - 1.00 mg/dL Final  12/05/2016 06:00 AM 0.66 0.44 - 1.00 mg/dL Final  12/03/2016 04:31 AM 0.91 0.44 - 1.00 mg/dL Final   12/02/2016 03:39 PM 1.13 (H) 0.44 - 1.00 mg/dL Final  03/10/2015 03:45 AM 1.03 0.50 - 1.10 mg/dL Final  03/08/2015 11:20 AM 0.98 0.50 - 1.10 mg/dL Final  03/07/2015 03:24 AM 0.90 0.50 - 1.10 mg/dL Final  03/06/2015 05:24 AM 0.96 0.50 - 1.10 mg/dL Final  03/05/2015 03:04 PM 0.92 0.50 - 1.10 mg/dL Final  03/05/2015 09:38 AM 1.00 0.50 - 1.10 mg/dL Final  02/23/2015 06:47 PM 0.82 0.50 - 1.10 mg/dL Final  11/06/2014 05:50 AM 0.75 0.50 - 1.10 mg/dL Final  11/04/2014 08:20 AM 0.95 0.50 - 1.10 mg/dL Final  11/03/2014 04:20 AM 0.91 0.50 - 1.10 mg/dL Final  11/01/2014 09:54 PM 0.83 0.50 - 1.10 mg/dL Final  11/01/2014 03:39 PM 0.93 0.50 - 1.10 mg/dL Final  10/03/2014 06:06 AM 0.79 0.50 - 1.10 mg/dL Final  11/02/2013 05:20 AM 0.73 0.50 - 1.10 mg/dL Final  10/28/2013 05:45 AM 0.72 0.50 - 1.10 mg/dL Final  10/27/2013 03:34 AM 0.74 0.50 - 1.10 mg/dL Final  10/24/2013 11:00 PM 0.66 0.50 - 1.10 mg/dL Final  09/02/2011 08:40 AM 0.60 0.50 - 1.10 mg/dL Final  08/30/2009 08:37 PM 0.78 (0.40-1.20 mg/dL Final  05/17/2009 08:48 PM 0.63 0.40 - 1.20 mg/dL Final  05/08/2009 05:25 AM 0.63 0.4 - 1.2 mg/dL Final  05/07/2009 04:20 AM 0.59 0.4 - 1.2 mg/dL Final  05/06/2009 04:00 AM 0.47 0.4 - 1.2 mg/dL Final  05/05/2009 04:45 AM 0.49 0.4 -  1.2 mg/dL Final  05/04/2009 06:35 PM 0.45 0.4 - 1.2 mg/dL Final  05/04/2009 04:55 AM 0.59 0.4 - 1.2 mg/dL Final  05/03/2009 04:15 AM 0.55 0.4 - 1.2 mg/dL Final  05/02/2009 06:52 AM 0.50 0.4 - 1.2 mg/dL Final  05/01/2009 03:50 AM 0.55 0.4 - 1.2 mg/dL Final  04/30/2009 04:59 AM 0.73 0.4 - 1.2 mg/dL Final  04/29/2009 09:46 AM 0.60 0.4 - 1.2 mg/dL Final  04/29/2009 04:00 AM 0.59 0.4 - 1.2 mg/dL Final  04/28/2009 04:09 AM 0.62 0.4 - 1.2 mg/dL Final  04/27/2009 10:38 AM 0.90 0.4 - 1.2 mg/dL Final     PMHx:   Past Medical History:  Diagnosis Date  . Anemia   . Anxiety   . Bacteremia   . Bursitis    "left hip; sometimes left shoulder" (03/05/2015)  . Chest pain     Troponins up to 0.12 while hospitalized for E.coli PNA  . Depression   . Family history of adverse reaction to anesthesia    "brother died Jun 25, 2013; woke up w/headache, vomited blood; drilled holes in his head; bleeding stroke; never came back" (03/05/2015)  . Headache    "when someone gets on my nerves" (03/05/2015)  . High cholesterol   . Hypertension   . Narcolepsy   . 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 Stroke [facility]) 10/2014   "weaker on left side since" (03/05/2015)  . Type II diabetes mellitus ([place])   . Walking pneumonia 04/2009   "double" /notes 04/23/2011    Past Surgical History:  Procedure Laterality Date  . ABDOMINAL AORTAGRAM N/A 03/05/2015   Procedure: ABDOMINAL Maxcine Ham;  Surgeon: Angelia Mould, MD;  Location: [facility] CATH LAB;  Service: Cardiovascular;  Laterality: N/A;  . AMPUTATION Right 11/03/2014   Procedure: AMPUTATION BELOW KNEE;  Surgeon: Elam Dutch, MD;  Location: [facility];  Service: Vascular;  Laterality: Right;  . FEMORAL-TIBIAL BYPASS GRAFT Left 03/06/2015   Procedure: BYPASS GRAFT Left FEMORAL-POSTERIOR TIBIAL ARTERY with reversed saphenous vein;  Surgeon: Elam Dutch, MD;  Location: [facility];  Service: Vascular;  Laterality: Left;  . INTRAOPERATIVE ARTERIOGRAM Left 03/06/2015   Procedure: INTRA OPERATIVE ARTERIOGRAM;  Surgeon: Elam Dutch, MD;  Location: [facility];  Service: Vascular;  Laterality: Left;  . IR FLUORO GUIDE CV MIDLINE PICC RIGHT  07/29/2017  . IR US GUIDE VASC ACCESS RIGHT  07/29/2017  . IRRIGATION AND DEBRIDEMENT FOOT Left 12/05/2016   Procedure: IRRIGATION AND DEBRIDEMENT FOOT;  Surgeon: Samara Deist, DPM;  Location: [facility] ORS;  Service: Podiatry;  Laterality: Left;  . LOWER EXTREMITY ANGIOGRAM N/A 10/03/2014   Procedure: LOWER EXTREMITY ANGIOGRAM;  Surgeon: Laverda Page, MD;  Location: [facility] CATH LAB;  Service: Cardiovascular;  Laterality: N/A;  . PERIPHERAL VASCULAR CATHETERIZATION Left 12/03/2016   Procedure: Lower  Extremity Angiography;  Surgeon: Algernon Huxley, MD;  Location: [facility] CV LAB;  Service: Cardiovascular;  Laterality: Left;    Family Hx:  Family History  Problem Relation Age of Onset  . CAD Mother   . CAD Father     Social History:  reports that she quit smoking about a year ago. Her smoking use included cigarettes. She has a 4.00 pack-year smoking history. she has never used smokeless tobacco. She reports that she drinks alcohol. She reports that she does not use drugs.  Allergies:  Allergies  Allergen Reactions  . Penicillins Hives    Has patient had a PCN reaction causing immediate rash, facial/tongue/throat  swelling, SOB or lightheadedness with hypotension: Yes Has patient had a PCN reaction causing severe rash involving mucus membranes or skin necrosis: No Has patient had a PCN reaction that required hospitalization: No Has patient had a PCN reaction occurring within the last 10 years: No If all of the above answers are "NO", then may proceed with Cephalosporin use.   Childhood allergy could have been more reaction only rem    Medications: Prior to Admission medications   Medication Sig Start Date End Date Taking? Authorizing Provider  amLODipine (NORVASC) 5 MG tablet Take 1 tablet (5 mg total) by mouth daily. 08/03/17  Yes Elgergawy, Silver Huguenin, MD  amphetamine-dextroamphetamine (ADDERALL) 30 MG tablet Take 30 mg by mouth daily with breakfast. Reported on 07/02/2016   Yes [provider]  ascorbic acid (VITAMIN C) 500 MG tablet Take 500 mg by mouth 3 (three) times daily.    Yes [provider]  atorvastatin (LIPITOR) 10 MG tablet Take 1 tablet (10 mg total) by mouth daily at 6 PM. Patient taking differently: Take 20 mg by mouth daily at 6 PM.  11/04/13  Yes Love, Ivan Anchors, PA-C  cloNIDine (CATAPRES) 0.2 MG tablet Take 0.2 mg by mouth 3 (three) times daily.   Yes [provider]  clopidogrel (PLAVIX) 75 MG tablet Take 75 mg by mouth daily.   Yes  [provider]  DULoxetine (CYMBALTA) 60 MG capsule Take 60 mg by mouth daily.   Yes [provider]  gabapentin (NEURONTIN) 300 MG capsule Take 1 capsule (300 mg total) by mouth 2 (two) times daily. Patient taking differently: Take 300 mg by mouth 3 (three) times daily.  03/13/14  Yes Kirsteins, Luanna Salk, MD  HYDROcodone-acetaminophen (NORCO/VICODIN) 5-325 MG tablet Take 1 tablet by mouth every 6 (six) hours as needed for moderate pain. 08/02/17  Yes Elgergawy, Silver Huguenin, MD  insulin aspart (NOVOLOG) 100 UNIT/ML injection Inject 0-12 Units into the skin 4 (four) times daily. Sliding scale-  <200= 0 units 201-250= 2 units 251-300= 4 units 301-350= 6 units 351-400=8 units 401-450= 10 units 451-500= 12 units >501= call MD   Yes [provider]  Insulin Glargine (TOUJEO SOLOSTAR [place]) Inject 90 Units into the skin at bedtime.    Yes [provider]  ipratropium-albuterol (DUONEB) 0.5-2.5 (3) MG/3ML SOLN Take 3 mLs by nebulization every 6 (six) hours as needed. 08/02/17  Yes Elgergawy, Silver Huguenin, MD  lamoTRIgine (LAMICTAL) 100 MG tablet Take 100 mg by mouth 2 (two) times daily.    Yes [provider]  lisinopril (PRINIVIL,ZESTRIL) 20 MG tablet Take 1 tablet (20 mg total) by mouth daily. 11/07/14  Yes Kelvin Cellar, MD  loratadine (CLARITIN) 10 MG tablet Take 10 mg by mouth at bedtime.   Yes [provider]  metolazone (ZAROXOLYN) 10 MG tablet Take 10 mg by mouth every Monday, Wednesday, and Friday.   Yes [provider]  Multiple Vitamins-Minerals (CERTAGEN PO) Take 1 tablet by mouth daily.    Yes [provider]  phenylephrine-shark liver oil-mineral oil-petrolatum (PREPARATION H) 0.25-3-14-71.9 % rectal ointment Place 1 application rectally 3 (three) times daily as needed for hemorrhoids.   Yes [provider]  Polyethyl Glycol-Propyl Glycol (SYSTANE) 0.4-0.3 % SOLN Place 1 drop into both eyes 2 (two) times daily.     Yes [provider]  ranitidine (ZANTAC) 150 MG tablet Take 150 mg by mouth at bedtime.   Yes [provider]  senna-docusate (SENOKOT-S) 8.6-50 MG tablet Take 1 tablet  by mouth at bedtime as needed for mild constipation. 12/08/16  Yes Dustin Flock, MD  torsemide (DEMADEX) 100 MG tablet Take 100 mg by mouth daily.   Yes [provider]  diclofenac sodium (VOLTAREN) 1 % GEL Apply 4 g topically 4 (four) times daily. Patient not taking: Reported on 11/24/2017 06/20/16   Palumbo, April, MD  insulin detemir (LEVEMIR) 100 UNIT/ML injection Inject 0.5 mLs (50 Units total) into the skin at bedtime. Patient not taking: Reported on 11/24/2017 08/02/17   Elgergawy, Silver Huguenin, MD    I have reviewed the patient's current medications.  Labs:  Results for orders placed or performed during the hospital encounter of 11/24/17 (from the past 48 hour(s))  CBC     Status: Abnormal   Collection Time: 11/24/17 12:47 PM  Result Value Ref Range   WBC 10.8 (H) 4.0 - 10.5 K/uL   RBC 3.82 (L) 3.87 - 5.11 MIL/uL   Hemoglobin 11.4 (L) 12.0 - 15.0 g/dL   HCT 34.2 (L) 36.0 - 46.0 %   MCV 89.5 78.0 - 100.0 fL   MCH 29.8 26.0 - 34.0 pg   MCHC 33.3 30.0 - 36.0 g/dL   RDW 13.6 11.5 - 15.5 %   Platelets 355 150 - 400 K/uL  Comprehensive metabolic panel     Status: Abnormal   Collection Time: 11/24/17 12:47 PM  Result Value Ref Range   Sodium 135 135 - 145 mmol/L   Potassium 3.8 3.5 - 5.1 mmol/L   Chloride 92 (L) 101 - 111 mmol/L   CO2 21 (L) 22 - 32 mmol/L   Glucose, Bld 170 (H) 65 - 99 mg/dL   BUN 174 (H) 6 - 20 mg/dL   Creatinine, Ser 5.17 (H) 0.44 - 1.00 mg/dL   Calcium 8.8 (L) 8.9 - 10.3 mg/dL   Total Protein 8.1 6.5 - 8.1 g/dL   Albumin 3.0 (L) 3.5 - 5.0 g/dL   AST 50 (H) 15 - 41 U/L   ALT 22 14 - 54 U/L   Alkaline Phosphatase 92 38 - 126 U/L   Total Bilirubin 0.8 0.3 - 1.2 mg/dL   GFR calc non Af Amer 8 (L) >60 mL/min   GFR calc Af Amer 9 (L) >60 mL/min    Comment: (NOTE) The  eGFR has been calculated using the CKD EPI equation. This calculation has not been validated in all clinical situations. eGFR's persistently <60 mL/min signify possible Chronic Kidney Disease.    Anion gap 22 (H) 5 - 15  I-Stat CG4 Lactic Acid, ED     Status: Abnormal   Collection Time: 11/24/17  1:14 PM  Result Value Ref Range   Lactic Acid, Venous 2.30 (HH) 0.5 - 1.9 mmol/L   Comment NOTIFIED PHYSICIAN   POC CBG, ED     Status: Abnormal   Collection Time: 11/24/17  1:33 PM  Result Value Ref Range   Glucose-Capillary 175 (H) 65 - 99 mg/dL   Comment 1 Notify RN    Comment 2 Document in Chart      ROS:  Review of systems not obtained due to patient factors.  Physical Exam: Vitals:   11/24/17 1515 11/24/17 1530  BP: 102/63 92/61  Pulse: 95 86  Resp: 16 14  Temp:    SpO2: 95% 100%     General: thin, confused BF- smells of urine HEENT: PERRLA, EOMI Neck:no JVD Heart: Tachy Lungs: mostly clear Abdomen: soft, non tender Extremities: no pitting edema- has right BKA and left leg seems  contracted, dry scaly skin, big ulcer at heel that is dark in color Skin: scaley Neuro: somnolent, arousable but confused  Assessment/Plan: 71 year old female on extensive blood pressure regimen as outpatient including very high dose diuretics.  She now presents with hypotension and AKI 1.Renal-baseline normal renal function 4 months ago.  No urinalysis as of yet for review.  Renal ultrasound is pending.  I suspect ATN secondary to hypotension in the setting of extensive blood pressure regimen and diuretics including an ACE inhibitor.  All of these medications should be held for admission and I agree with hydration.  Even though BUN is very high I suspect it will improve with hydration so there is not any acute need for dialysis at this time.  I hope that we will be able to escape that in this nice lady 2. Hypertension/volume  -hypotensive and appears volume depleted.  Getting volume repleted at  this time 3. Anemia  -last hemoglobin in the system was in the eights.  It is now in the 11s-another indication that she is likely dry 4.  ID-being started on Vanco and Maxipime for possible pneumonia   Nyles Mitton A 11/24/2017, 3:53 PM

## 2017-11-24 NOTE — Progress Notes (Signed)
Patient admitted to 5W30. Placed on cardiac bedside monitor as stepdown patient. Patient cleaned of stool, foley emptied and NS resumed at 150cc/hr. Skin assessed by RNX2, skin foam placed to sacrum, and foam place to left heel. Patient is A&O to place and self. Family at bedside.

## 2017-11-24 NOTE — ED Notes (Signed)
Foley insertion site appears fine. 

## 2017-11-24 NOTE — ED Provider Notes (Signed)
[facility] EMERGENCY DEPARTMENT Provider Note   CSN: [id number] Arrival date & time: 11/24/17  1149     History   Chief Complaint No chief complaint on file.  Level 5 caveat: Altered mental status  HPI Laurie [name] is a 71 y.o. female.  HPI Patient is a 71 year old female who is brought to the emergency department after increasing confusion over the past several days.  Blood work was obtained yesterday which demonstrated abnormal kidney function and thus he was sent to the ER for further evaluation.    Past Medical History:  Diagnosis Date  . Anemia   . Anxiety   . Bacteremia   . Bursitis    "left hip; sometimes left shoulder" (03/05/2015)  . Chest pain    Troponins up to 0.12 while hospitalized for E.coli PNA  . Depression   . Family history of adverse reaction to anesthesia    "brother died 2013-07-23; woke up w/headache, vomited blood; drilled holes in his head; bleeding stroke; never came back" (03/05/2015)  . Headache    "when someone gets on my nerves" (03/05/2015)  . High cholesterol   . Hypertension   . Narcolepsy   . 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 Stroke [facility]) 10/2014   "weaker on left side since" (03/05/2015)  . Type II diabetes mellitus (HCC)   . Walking pneumonia 04/2009   "double" /notes 04/23/2011    Patient Active Problem List   Diagnosis Date Noted  . Fever of unknown origin   . SOB (shortness of breath)   . Community acquired pneumonia of left lower lobe of lung (HCC)   . Sepsis (HCC) 07/23/2017  . Obesity (BMI 30-39.9) 07/23/2017  . Atherosclerosis of native arteries of the extremities with ulceration (HCC) 01/21/2017  . Pressure injury of skin 12/03/2016  . PVD (peripheral vascular disease) (HCC) 03/05/2015  . S/P BKA (below knee amputation) (HCC) 11/23/2014  . Depression 11/11/2014  . Gangrene of foot (HCC)   . Hypokalemia   . Peripheral vascular disease (HCC) 11/02/2014  . Gangrene (HCC)  11/02/2014  . Wound, surgical, infected 11/01/2014  . Diabetic foot ulcer (HCC) 11/01/2014  . Trochanteric bursitis of left hip 05/29/2014  . Spastic hemiplegia affecting nondominant side (HCC) 12/02/2013  . Chronic diastolic heart failure, grade 2 10/26/2013  . Dyslipidemia 10/26/2013  . CVA (cerebral infarction) 10/25/2013  . Arthralgia of hip 10/25/2013  . Anemia 05/17/2009  . Diabetes mellitus type 2 with peripheral artery disease (HCC) 05/08/2009  . Anxiety state 05/08/2009  . Essential hypertension 05/08/2009    Past Surgical History:  Procedure Laterality Date  . ABDOMINAL AORTAGRAM N/A 03/05/2015   Procedure: ABDOMINAL Ronny Flurry;  Surgeon: Chuck Hint, MD;  Location: [facility] CATH LAB;  Service: Cardiovascular;  Laterality: N/A;  . AMPUTATION Right 11/03/2014   Procedure: AMPUTATION BELOW KNEE;  Surgeon: Sherren Kerns, MD;  Location: [facility] OR;  Service: Vascular;  Laterality: Right;  . FEMORAL-TIBIAL BYPASS GRAFT Left 03/06/2015   Procedure: BYPASS GRAFT Left FEMORAL-POSTERIOR TIBIAL ARTERY with reversed saphenous vein;  Surgeon: Sherren Kerns, MD;  Location: [facility] OR;  Service: Vascular;  Laterality: Left;  . INTRAOPERATIVE ARTERIOGRAM Left 03/06/2015   Procedure: INTRA OPERATIVE ARTERIOGRAM;  Surgeon: Sherren Kerns, MD;  Location: [facility] OR;  Service: Vascular;  Laterality: Left;  . IR FLUORO GUIDE CV MIDLINE PICC RIGHT  07/29/2017  . IR US GUIDE VASC ACCESS RIGHT  07/29/2017  . IRRIGATION AND DEBRIDEMENT  FOOT Left 12/05/2016   Procedure: IRRIGATION AND DEBRIDEMENT FOOT;  Surgeon: Gwyneth Revels, DPM;  Location: [facility] ORS;  Service: Podiatry;  Laterality: Left;  . LOWER EXTREMITY ANGIOGRAM N/A 10/03/2014   Procedure: LOWER EXTREMITY ANGIOGRAM;  Surgeon: Pamella Pert, MD;  Location: [facility] CATH LAB;  Service: Cardiovascular;  Laterality: N/A;  . PERIPHERAL VASCULAR CATHETERIZATION Left 12/03/2016   Procedure: Lower Extremity Angiography;  Surgeon: Annice Needy, MD;  Location: [facility]  INVASIVE CV LAB;  Service: Cardiovascular;  Laterality: Left;    OB History    No data available       Home Medications    Prior to Admission medications   Medication Sig Start Date End Date Taking? Authorizing Provider  amLODipine (NORVASC) 5 MG tablet Take 1 tablet (5 mg total) by mouth daily. 08/03/17  Yes Elgergawy, Leana Roe, MD  amphetamine-dextroamphetamine (ADDERALL) 30 MG tablet Take 30 mg by mouth daily with breakfast. Reported on 07/02/2016   Yes [provider]  ascorbic acid (VITAMIN C) 500 MG tablet Take 500 mg by mouth 3 (three) times daily.    Yes [provider]  atorvastatin (LIPITOR) 10 MG tablet Take 1 tablet (10 mg total) by mouth daily at 6 PM. Patient taking differently: Take 20 mg by mouth daily at 6 PM.  11/04/13  Yes Love, Evlyn Kanner, PA-C  cloNIDine (CATAPRES) 0.2 MG tablet Take 0.2 mg by mouth 3 (three) times daily.   Yes [provider]  clopidogrel (PLAVIX) 75 MG tablet Take 75 mg by mouth daily.   Yes [provider]  DULoxetine (CYMBALTA) 60 MG capsule Take 60 mg by mouth daily.   Yes [provider]  gabapentin (NEURONTIN) 300 MG capsule Take 1 capsule (300 mg total) by mouth 2 (two) times daily. Patient taking differently: Take 300 mg by mouth 3 (three) times daily.  03/13/14  Yes Kirsteins, Victorino Sparrow, MD  HYDROcodone-acetaminophen (NORCO/VICODIN) 5-325 MG tablet Take 1 tablet by mouth every 6 (six) hours as needed for moderate pain. 08/02/17  Yes Elgergawy, Leana Roe, MD  insulin aspart (NOVOLOG) 100 UNIT/ML injection Inject 0-12 Units into the skin 4 (four) times daily. Sliding scale-  <200= 0 units 201-250= 2 units 251-300= 4 units 301-350= 6 units 351-400=8 units 401-450= 10 units 451-500= 12 units >501= call MD   Yes [provider]  Insulin Glargine (TOUJEO SOLOSTAR [place]) Inject 90 Units into the skin at bedtime.    Yes [provider]  ipratropium-albuterol (DUONEB) 0.5-2.5 (3) MG/3ML SOLN  Take 3 mLs by nebulization every 6 (six) hours as needed. 08/02/17  Yes Elgergawy, Leana Roe, MD  lamoTRIgine (LAMICTAL) 100 MG tablet Take 100 mg by mouth 2 (two) times daily.    Yes [provider]  lisinopril (PRINIVIL,ZESTRIL) 20 MG tablet Take 1 tablet (20 mg total) by mouth daily. 11/07/14  Yes Jeralyn Bennett, MD  loratadine (CLARITIN) 10 MG tablet Take 10 mg by mouth at bedtime.   Yes [provider]  metolazone (ZAROXOLYN) 10 MG tablet Take 10 mg by mouth every Monday, Wednesday, and Friday.   Yes [provider]  Multiple Vitamins-Minerals (CERTAGEN PO) Take 1 tablet by mouth daily.    Yes [provider]  phenylephrine-shark liver oil-mineral oil-petrolatum (PREPARATION H) 0.25-3-14-71.9 % rectal ointment Place 1 application rectally 3 (three) times daily as needed for hemorrhoids.   Yes [provider]  Polyethyl Glycol-Propyl Glycol (SYSTANE) 0.4-0.3 % SOLN Place 1 drop into both eyes 2 (two) times daily.  Yes [provider]  ranitidine (ZANTAC) 150 MG tablet Take 150 mg by mouth at bedtime.   Yes [provider]  senna-docusate (SENOKOT-S) 8.6-50 MG tablet Take 1 tablet by mouth at bedtime as needed for mild constipation. 12/08/16  Yes Auburn BilberryPatel, Shreyang, MD  torsemide (DEMADEX) 100 MG tablet Take 100 mg by mouth daily.   Yes [provider]  diclofenac sodium (VOLTAREN) 1 % GEL Apply 4 g topically 4 (four) times daily. Patient not taking: Reported on 11/24/2017 06/20/16   Palumbo, April, MD  insulin detemir (LEVEMIR) 100 UNIT/ML injection Inject 0.5 mLs (50 Units total) into the skin at bedtime. Patient not taking: Reported on 11/24/2017 08/02/17   Elgergawy, Leana Roeawood S, MD    Family History Family History  Problem Relation Age of Onset  . CAD Mother   . CAD Father     Social History Social History   Tobacco Use  . Smoking status: Former Smoker    Packs/day: 0.10    Years: 40.00    Pack years: 4.00     Types: Cigarettes    Last attempt to quit: 11/21/2016    Years since quitting: 1.0  . Smokeless tobacco: Never Used  Substance Use Topics  . Alcohol use: Yes    Alcohol/week: 0.0 oz    Comment: 03/05/2015 "Might have a drink on holidays"  . Drug use: No     Allergies   Penicillins   Review of Systems Review of Systems  Unable to perform ROS: Mental status change     Physical Exam Updated Vital Signs BP (!) 106/57   Pulse 93   Temp 99.2 F (37.3 C) (Rectal)   Resp 15   Ht 5\' 3"  (1.6 m)   Wt 100.7 kg (222 lb)   SpO2 91%   BMI 39.33 kg/m   Physical Exam  Constitutional: She appears well-developed and well-nourished.  HENT:  Head: Normocephalic.  Eyes: EOM are normal.  Neck: Normal range of motion.  Cardiovascular: Normal rate.  Pulmonary/Chest: Effort normal.  Abdominal: She exhibits no distension.  Musculoskeletal: Normal range of motion.  Chronic appearing ulceration of the left heel with mild surrounding erythema.  Neurological:  Withdraws to pain.  Skin: Skin is warm.  Psychiatric: She has a normal mood and affect.  Nursing note and vitals reviewed.    ED Treatments / Results  Labs (all labs ordered are listed, but only abnormal results are displayed) Labs Reviewed  CBC - Abnormal; Notable for the following components:      Result Value   WBC 10.8 (*)    RBC 3.82 (*)    Hemoglobin 11.4 (*)    HCT 34.2 (*)    All other components within normal limits  COMPREHENSIVE METABOLIC PANEL - Abnormal; Notable for the following components:   Chloride 92 (*)    CO2 21 (*)    Glucose, Bld 170 (*)    BUN 174 (*)    Creatinine, Ser 5.17 (*)    Calcium 8.8 (*)    Albumin 3.0 (*)    AST 50 (*)    GFR calc non Af Amer 8 (*)    GFR calc Af Amer 9 (*)    Anion gap 22 (*)    All other components within normal limits  CBG MONITORING, ED - Abnormal; Notable for the following components:   Glucose-Capillary 175 (*)    All other components within normal limits    I-STAT CG4 LACTIC ACID, ED - Abnormal; Notable for the  following components:   Lactic Acid, Venous 2.30 (*)    All other components within normal limits  URINE CULTURE  CULTURE, BLOOD (ROUTINE X 2)  CULTURE, BLOOD (ROUTINE X 2)  URINALYSIS, ROUTINE W REFLEX MICROSCOPIC  I-STAT ARTERIAL BLOOD GAS, ED    EKG  EKG Interpretation  Date/Time:  Tuesday November 24 2017 13:42:08 EST Ventricular Rate:  104 PR Interval:    QRS Duration: 113 QT Interval:  360 QTC Calculation: 474 R Axis:   -7 Text Interpretation:  Sinus tachycardia Incomplete left bundle branch block Anterior Q waves, possibly due to ILBBB Baseline wander in lead(s) I III aVL No significant change was found Confirmed by Azalia Bilisampos, Shakya Sebring ([id number]) on 11/24/2017 2:15:27 PM       Radiology Dg Chest 2 View  Result Date: 11/24/2017 CLINICAL DATA:  Altered mental status, abnormal kidney functions EXAM: CHEST  2 VIEW COMPARISON:  Portable chest x-ray of 07/30/2017 FINDINGS: Although the previously described airspace disease has significantly improved there is patchy opacity remaining in the periphery of the right mid lung. Continued followup is recommended. No definite pleural effusion is seen. Mediastinal and hilar contours are unremarkable and mild cardiomegaly is stable. No bony abnormality is noted. IMPRESSION: Minimal airspace disease remains in the periphery of the right mid lung possibly representing pneumonia. Recommend continued followup. Electronically Signed   By: Dwyane DeePaul  Barry M.D.   On: 11/24/2017 14:26   Dg Foot Complete Left  Result Date: 11/24/2017 CLINICAL DATA:  71 year old diabetic female with open wound left foot. Initial encounter. EXAM: LEFT FOOT - COMPLETE 3+ VIEW COMPARISON:  None. FINDINGS: Ulceration inferior to plantar spur.  No adjacent osteomyelitis. Since the prior examination, change in configuration of posterior aspect of calcaneus superior to the Achilles tendon insertion site. This does not have typical  configuration of osteomyelitis. No fracture or dislocation. IMPRESSION: Ulceration inferior to plantar spur.  No adjacent osteomyelitis. Since the prior examination, change in configuration of posterior aspect of calcaneus superior to the Achilles tendon insertion site. This does not have typical configuration of osteomyelitis. If further delineation were clinically desired, MR imaging may prove more sensitive. Electronically Signed   By: Lacy DuverneySteven  Olson M.D.   On: 11/24/2017 14:25    Procedures Procedures (including critical care time)  Medications Ordered in ED Medications  sodium chloride 0.9 % bolus 1,000 mL (not administered)    And  sodium chloride 0.9 % bolus 1,000 mL (not administered)    And  sodium chloride 0.9 % bolus 500 mL (not administered)  vancomycin (VANCOCIN) 2,000 mg in sodium chloride 0.9 % 500 mL IVPB (2,000 mg Intravenous New Bag/Given 11/24/17 1426)  ceFEPIme (MAXIPIME) 500 mg in dextrose 5 % 50 mL IVPB (not administered)  sodium chloride 0.9 % bolus 3,021 mL (3,021 mLs Intravenous New Bag/Given 11/24/17 1328)  ceFEPIme (MAXIPIME) 2 g in dextrose 5 % 50 mL IVPB (0 g Intravenous Stopped 11/24/17 1412)     Initial Impression / Assessment and Plan / ED Course  I have reviewed the triage vital signs and the nursing notes.  Pertinent labs & imaging results that were available during my care of the patient were reviewed by me and considered in my medical decision making (see chart for details).     Hypotension.  Acute renal failure.  IV antibiotics now for suspected sepsis.  Could represent soft tissue cellulitis and skin infection around her left heel.  Questionable pneumonia.  This will be covered with antibiotics as well.  Her hypotension resolved with  IV fluids.  Acute kidney injury.  Uremia likely the cause of her altered mental status.  Final Clinical Impressions(s) / ED Diagnoses   Final diagnoses:  Acute kidney injury [facility])    ED Discharge Orders    None        Azalia Bilis, MD 11/24/17 1451

## 2017-11-24 NOTE — H&P (Signed)
History and Physical    Laurie [name]:[id number] DOB: [birth year] DOA: 11/24/2017   PCP: Elwyn Reach, MD   Attending physician: Denton Brick  Patient coming from/Resides with: Skilled nursing facility/[facility]  Chief Complaint: Altered mentation and reported abnormal renal function from PCP office  HPI: Laurie [name] is a 71 y.o. female with medical history significant for chronic diastolic heart failure on ACE inhibitor, Zaroxolyn and Demadex, peripheral vascular disease with prior right BKA, chronic left heel ulcer, anemia of chronic kidney disease, diabetes on insulin, dyslipidemia, hypertension and narcolepsy.  She has had altered mentation since last Friday and was taken to her PCP office.  Subsequently the facility has been notified of abnormal renal function labs.  EMS was called to the nurse facility because of worsened mentation.  Patient's blood pressure was suboptimal and she was given 300 cc normal saline by the paramedics.  Upon presentation patient's initial blood pressure was 63/20 with a pulse of 111, she had a rectal temperature of 99.2.  Chest x-ray revealed possible right midlung zone pneumonia.  Patient had mild leukocytosis.  She has a chronic left heel ulcer which bleeds easily and partially covered by necrotic base and does have somewhat of a foul odor.  Left foot x-ray without fracture, dislocation or evidence of osteomyelitis.  Patient's lactic acid was mildly elevated at 2.3 in the context of severe acute kidney injury with a BUN of 174 and a creatinine of 5.17.  With elevated lactic acid, hypotension, acute kidney injury and possible findings of infection in the lung sepsis protocol was initiated by the EDP and she has been given a dose of broad-spectrum empiric antibiotics after blood cultures were obtained.  Patient has not made any urine despite fluid resuscitation.  Bladder scan showed no urine in the bladder.  Urinalysis ordered but has not yet been  collected.  I have informed the nurse that I will place an order for Foley catheter insertion for accurate intake and output.  ED Course:  Vital Signs: BP 92/61   Pulse 86   Temp 99.2 F (37.3 C) (Rectal)   Resp 14   Ht 5' 3"  (1.6 m)   Wt 100.7 kg (222 lb)   SpO2 100%   BMI 39.33 kg/m  2V CXR: Minimal airspace disease remains in the periphery of the right midlung zone possibly representing pneumonia. DG foot: Ulceration inferior to plantar spur without adjacent osteomyelitis Lab data: Sodium 135, potassium 3.8, chloride 92, CO2 21, glucose 170, BUN 174, creatinine 5.17, anion gap 22, AST 50, albumin 3.0, lactic acid 2.3, white count 10,800 differential not obtained, hemoglobin 11.4, platelets 355,000, blood cultures obtained in the ER, urinalysis ordered but patient has not had any urinary output so has not yet been collected  Review of Systems:  In addition to the HPI above,  No Fever-chills, myalgias or other constitutional symptoms No Headache, changes with Vision or hearing, new weakness, tingling, numbness in any extremity, dysarthria or word finding difficulty, gait disturbance or imbalance, tremors or seizure activity; wheelchair-bound No problems swallowing food or Liquids, indigestion/reflux, choking or coughing while eating, abdominal pain with or after eating No Chest pain, Cough or Shortness of Breath, palpitations, orthopnea or DOE No Abdominal pain, N/V, melena,hematochezia, dark tarry stools, constipation No dysuria, malodorous urine, hematuria or flank pain; reports chronic issues with urinary incontinence and significant urinary output with utilization of diuretics therefore states that she tries not to drink much fluid during the day No new skin rashes, lesions,  masses or bruises; patient has new sacral decubitus which she describes as being painful No new joint pains, aches, swelling or redness No recent unintentional weight gain or loss No polyuria, polydypsia or  polyphagia   Past Medical History:  Diagnosis Date  . Anemia   . Anxiety   . Bacteremia   . Bursitis    "left hip; sometimes left shoulder" (03/05/2015)  . Chest pain    Troponins up to 0.12 while hospitalized for E.coli PNA  . Depression   . Family history of adverse reaction to anesthesia    "brother died 07/09/13; woke up w/headache, vomited blood; drilled holes in his head; bleeding stroke; never came back" (03/05/2015)  . Headache    "when someone gets on my nerves" (03/05/2015)  . High cholesterol   . Hypertension   . Narcolepsy   . 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 Stroke [facility]) 10/2014   "weaker on left side since" (03/05/2015)  . Type II diabetes mellitus ([place])   . Walking pneumonia 04/2009   "double" /notes 04/23/2011    Past Surgical History:  Procedure Laterality Date  . ABDOMINAL AORTAGRAM N/A 03/05/2015   Procedure: ABDOMINAL Maxcine Ham;  Surgeon: Angelia Mould, MD;  Location: [facility] CATH LAB;  Service: Cardiovascular;  Laterality: N/A;  . AMPUTATION Right 11/03/2014   Procedure: AMPUTATION BELOW KNEE;  Surgeon: Elam Dutch, MD;  Location: [place];  Service: Vascular;  Laterality: Right;  . FEMORAL-TIBIAL BYPASS GRAFT Left 03/06/2015   Procedure: BYPASS GRAFT Left FEMORAL-POSTERIOR TIBIAL ARTERY with reversed saphenous vein;  Surgeon: Elam Dutch, MD;  Location: [place];  Service: Vascular;  Laterality: Left;  . INTRAOPERATIVE ARTERIOGRAM Left 03/06/2015   Procedure: INTRA OPERATIVE ARTERIOGRAM;  Surgeon: Elam Dutch, MD;  Location: [place];  Service: Vascular;  Laterality: Left;  . IR FLUORO GUIDE CV MIDLINE PICC RIGHT  07/29/2017  . IR US GUIDE VASC ACCESS RIGHT  07/29/2017  . IRRIGATION AND DEBRIDEMENT FOOT Left 12/05/2016   Procedure: IRRIGATION AND DEBRIDEMENT FOOT;  Surgeon: Samara Deist, DPM;  Location: [facility] ORS;  Service: Podiatry;  Laterality: Left;  . LOWER EXTREMITY ANGIOGRAM N/A 10/03/2014   Procedure: LOWER EXTREMITY  ANGIOGRAM;  Surgeon: Laverda Page, MD;  Location: [facility] CATH LAB;  Service: Cardiovascular;  Laterality: N/A;  . PERIPHERAL VASCULAR CATHETERIZATION Left 12/03/2016   Procedure: Lower Extremity Angiography;  Surgeon: Algernon Huxley, MD;  Location: [place] CV LAB;  Service: Cardiovascular;  Laterality: Left;    Social History   Socioeconomic History  . Marital status: Divorced    Spouse name: Not on file  . Number of children: Not on file  . Years of education: Not on file  . Highest education level: Not on file  Social Needs  . Financial resource strain: Not on file  . Food insecurity - worry: Not on file  . Food insecurity - inability: Not on file  . Transportation needs - medical: Not on file  . Transportation needs - non-medical: Not on file  Occupational History  . Not on file  Tobacco Use  . Smoking status: Former Smoker    Packs/day: 0.10    Years: 40.00    Pack years: 4.00    Types: Cigarettes    Last attempt to quit: 11/21/2016    Years since quitting: 1.0  . Smokeless tobacco: Never Used  Substance and Sexual Activity  . Alcohol use: Yes    Alcohol/week: 0.0 oz  Comment: 03/05/2015 "Might have a drink on holidays"  . Drug use: No  . Sexual activity: Not Currently  Other Topics Concern  . Not on file  Social History Narrative   She lives with her sister and they do not have a good relationship.    Mobility: Wheelchair dependent Work history: Not obtained   Allergies  Allergen Reactions  . Penicillins Hives    Has patient had a PCN reaction causing immediate rash, facial/tongue/throat swelling, SOB or lightheadedness with hypotension: Yes Has patient had a PCN reaction causing severe rash involving mucus membranes or skin necrosis: No Has patient had a PCN reaction that required hospitalization: No Has patient had a PCN reaction occurring within the last 10 years: No If all of the above answers are "NO", then may proceed with Cephalosporin  use.   Childhood allergy could have been more reaction only rem    Family History  Problem Relation Age of Onset  . CAD Mother   . CAD Father      Prior to Admission medications   Medication Sig Start Date End Date Taking? Authorizing Provider  amLODipine (NORVASC) 5 MG tablet Take 1 tablet (5 mg total) by mouth daily. 08/03/17  Yes Elgergawy, Silver Huguenin, MD  amphetamine-dextroamphetamine (ADDERALL) 30 MG tablet Take 30 mg by mouth daily with breakfast. Reported on 07/02/2016   Yes [provider]  ascorbic acid (VITAMIN C) 500 MG tablet Take 500 mg by mouth 3 (three) times daily.    Yes [provider]  atorvastatin (LIPITOR) 10 MG tablet Take 1 tablet (10 mg total) by mouth daily at 6 PM. Patient taking differently: Take 20 mg by mouth daily at 6 PM.  11/04/13  Yes Love, Ivan Anchors, PA-C  cloNIDine (CATAPRES) 0.2 MG tablet Take 0.2 mg by mouth 3 (three) times daily.   Yes [provider]  clopidogrel (PLAVIX) 75 MG tablet Take 75 mg by mouth daily.   Yes [provider]  DULoxetine (CYMBALTA) 60 MG capsule Take 60 mg by mouth daily.   Yes [provider]  gabapentin (NEURONTIN) 300 MG capsule Take 1 capsule (300 mg total) by mouth 2 (two) times daily. Patient taking differently: Take 300 mg by mouth 3 (three) times daily.  03/13/14  Yes Kirsteins, Luanna Salk, MD  HYDROcodone-acetaminophen (NORCO/VICODIN) 5-325 MG tablet Take 1 tablet by mouth every 6 (six) hours as needed for moderate pain. 08/02/17  Yes Elgergawy, Silver Huguenin, MD  insulin aspart (NOVOLOG) 100 UNIT/ML injection Inject 0-12 Units into the skin 4 (four) times daily. Sliding scale-  <200= 0 units 201-250= 2 units 251-300= 4 units 301-350= 6 units 351-400=8 units 401-450= 10 units 451-500= 12 units >501= call MD   Yes [provider]  Insulin Glargine (TOUJEO SOLOSTAR [place]) Inject 90 Units into the skin at bedtime.    Yes [provider]  ipratropium-albuterol (DUONEB)  0.5-2.5 (3) MG/3ML SOLN Take 3 mLs by nebulization every 6 (six) hours as needed. 08/02/17  Yes Elgergawy, Silver Huguenin, MD  lamoTRIgine (LAMICTAL) 100 MG tablet Take 100 mg by mouth 2 (two) times daily.    Yes [provider]  lisinopril (PRINIVIL,ZESTRIL) 20 MG tablet Take 1 tablet (20 mg total) by mouth daily. 11/07/14  Yes Kelvin Cellar, MD  loratadine (CLARITIN) 10 MG tablet Take 10 mg by mouth at bedtime.   Yes [provider]  metolazone (ZAROXOLYN) 10 MG tablet Take 10 mg by mouth every Monday, Wednesday, and Friday.   Yes [provider]  Multiple Vitamins-Minerals (CERTAGEN PO) Take 1 tablet by mouth daily.    Yes [provider]  phenylephrine-shark liver oil-mineral oil-petrolatum (PREPARATION H) 0.25-3-14-71.9 % rectal ointment Place 1 application rectally 3 (three) times daily as needed for hemorrhoids.   Yes [provider]  Polyethyl Glycol-Propyl Glycol (SYSTANE) 0.4-0.3 % SOLN Place 1 drop into both eyes 2 (two) times daily.    Yes [provider]  ranitidine (ZANTAC) 150 MG tablet Take 150 mg by mouth at bedtime.   Yes [provider]  senna-docusate (SENOKOT-S) 8.6-50 MG tablet Take 1 tablet by mouth at bedtime as needed for mild constipation. 12/08/16  Yes Dustin Flock, MD  torsemide (DEMADEX) 100 MG tablet Take 100 mg by mouth daily.   Yes [provider]  diclofenac sodium (VOLTAREN) 1 % GEL Apply 4 g topically 4 (four) times daily. Patient not taking: Reported on 11/24/2017 06/20/16   Palumbo, April, MD  insulin detemir (LEVEMIR) 100 UNIT/ML injection Inject 0.5 mLs (50 Units total) into the skin at bedtime. Patient not taking: Reported on 11/24/2017 08/02/17   Elgergawy, Silver Huguenin, MD    Physical Exam: Vitals:   11/24/17 1430 11/24/17 1445 11/24/17 1515 11/24/17 1530  BP: (!) 106/57 (!) 96/57 102/63 92/61  Pulse: 93 95 95 86  Resp: 15 15 16 14   Temp:      TempSrc:      SpO2: 91% 95% 95% 100%   Weight:      Height:           Constitutional: NAD, calm, uncomfortable 2/2 sacral decubitus Eyes: PERRL, lids and conjunctivae normal ENMT: Mucous membranes are extremely dry. Posterior pharynx clear of any exudate or lesions.poor dentition.  Neck: normal, supple, no masses, no thyromegaly Respiratory: clear to auscultation bilaterally, no wheezing, no crackles. Normal respiratory effort. No accessory muscle use.  Cardiovascular: Regular rate and rhythm, no murmurs / rubs / gallops. No extremity edema. 2+ pedal pulses. No carotid bruits.  Hands and feet are cool to touch with diminished capillary refill greater than 2 seconds. Abdomen: no tenderness, no masses palpated. No hepatosplenomegaly. Bowel sounds positive.  Genitourinary: No urinary output, recent bladder scan was 0 cc noted Musculoskeletal: no clubbing / cyanosis. No joint deformity upper and lower extremities. Good ROM, no contractures.  Decreased muscle tone lower extremities.  Prior right BKA Skin: no rashes, lesions, No induration-left heel ulcer with partially necrotic base (see picture below); there is also a very tiny 1-1.2 cm linear stage II sacral decubitus.  Left lower extremity skin very dry with a puckering appearance consistent with extreme volume loss/dehydration. Neurologic: CN 2-12 grossly intact. Sensation intact, DTR clinical. Strength 4/5 x all 4 extremities but this is more reflective of altered mentation and lethargy than of true neurological deficit Psychiatric: Normal judgment and insight when awake but is quite lethargic in the setting of acute uremia.  She is awake she is oriented x3.      Labs on Admission: I have personally reviewed following labs and imaging studies  CBC: Recent Labs  Lab 11/24/17 1247  WBC 10.8*  HGB 11.4*  HCT 34.2*  MCV 89.5  PLT 564   Basic Metabolic Panel: Recent Labs  Lab 11/24/17 1247  NA 135  K 3.8  CL 92*  CO2 21*  GLUCOSE 170*  BUN 174*  CREATININE 5.17*   CALCIUM 8.8*   GFR: Estimated Creatinine Clearance: 11.6 mL/min (A) (by C-G formula based on SCr of 5.17 mg/dL (H)). Liver Function Tests:  Recent Labs  Lab 11/24/17 1247  AST 50*  ALT 22  ALKPHOS 92  BILITOT 0.8  PROT 8.1  ALBUMIN 3.0*   No results for input(s): LIPASE, AMYLASE in the last 168 hours. No results for input(s): AMMONIA in the last 168 hours. Coagulation Profile: No results for input(s): INR, PROTIME in the last 168 hours. Cardiac Enzymes: No results for input(s): CKTOTAL, CKMB, CKMBINDEX, TROPONINI in the last 168 hours. BNP (last 3 results) No results for input(s): PROBNP in the last 8760 hours. HbA1C: No results for input(s): HGBA1C in the last 72 hours. CBG: Recent Labs  Lab 11/24/17 1333  GLUCAP 175*   Lipid Profile: No results for input(s): CHOL, HDL, LDLCALC, TRIG, CHOLHDL, LDLDIRECT in the last 72 hours. Thyroid Function Tests: No results for input(s): TSH, T4TOTAL, FREET4, T3FREE, THYROIDAB in the last 72 hours. Anemia Panel: No results for input(s): VITAMINB12, FOLATE, FERRITIN, TIBC, IRON, RETICCTPCT in the last 72 hours. Urine analysis:    Component Value Date/Time   COLORURINE YELLOW 07/23/2017 [place] 07/23/2017 1151   LABSPEC 1.011 07/23/2017 1151   PHURINE 5.0 07/23/2017 1151   GLUCOSEU NEGATIVE 07/23/2017 1151   HGBUR NEGATIVE 07/23/2017 1151   BILIRUBINUR NEGATIVE 07/23/2017 1151   KETONESUR NEGATIVE 07/23/2017 1151   PROTEINUR NEGATIVE 07/23/2017 1151   UROBILINOGEN 1.0 04/30/2009 1200   NITRITE NEGATIVE 07/23/2017 1151   LEUKOCYTESUR NEGATIVE 07/23/2017 1151   Sepsis Labs: @LABRCNTIP (procalcitonin:4,lacticidven:4) )No results found for this or any previous visit (from the past 240 hour(s)).   Radiological Exams on Admission: Dg Chest 2 View  Result Date: 11/24/2017 CLINICAL DATA:  Altered mental status, abnormal kidney functions EXAM: CHEST  2 VIEW COMPARISON:  Portable chest x-ray of 07/30/2017 FINDINGS:  Although the previously described airspace disease has significantly improved there is patchy opacity remaining in the periphery of the right mid lung. Continued followup is recommended. No definite pleural effusion is seen. Mediastinal and hilar contours are unremarkable and mild cardiomegaly is stable. No bony abnormality is noted. IMPRESSION: Minimal airspace disease remains in the periphery of the right mid lung possibly representing pneumonia. Recommend continued followup. Electronically Signed   By: Ivar Drape M.D.   On: 11/24/2017 14:26   Dg Foot Complete Left  Result Date: 11/24/2017 CLINICAL DATA:  71 year old diabetic female with open wound left foot. Initial encounter. EXAM: LEFT FOOT - COMPLETE 3+ VIEW COMPARISON:  None. FINDINGS: Ulceration inferior to plantar spur.  No adjacent osteomyelitis. Since the prior examination, change in configuration of posterior aspect of calcaneus superior to the Achilles tendon insertion site. This does not have typical configuration of osteomyelitis. No fracture or dislocation. IMPRESSION: Ulceration inferior to plantar spur.  No adjacent osteomyelitis. Since the prior examination, change in configuration of posterior aspect of calcaneus superior to the Achilles tendon insertion site. This does not have typical configuration of osteomyelitis. If further delineation were clinically desired, MR imaging may prove more sensitive. Electronically Signed   By: Genia Del M.D.   On: 11/24/2017 14:25    EKG: (Independently reviewed) sinus tachycardia ventricular rate 104 bpm, QTC 474 ms, normal R wave rotation, no acute ischemic changes  Assessment/Plan Principal Problem:   ? Sepsis due to pneumonia vs foot ulcer  -Patient presents with altered mentation in the context of severe uremia and acute kidney injury with mildly elevated white count, mildly elevated lactic acid and AST with significant acute kidney injury with possible sources of infection to include  either right-sided pneumonia and less likely  left heel cellulitis/chronic left heel ulcer -Continue empiric broad-spectrum antibiotics for now (Maxipime/vancomycin) -Continue to cycle lactic acid although I suspect elevation primarily related to acidemia from acute kidney injury as well as hypoperfusion related to hypotension -Obtain Procalcitonin, ESR and cortisol level -Follow-up on blood cultures -Obtain urinalysis and culture -Continue volume resuscitation with normal saline 150/hr  Active Problems:   Acute kidney injury  -Patient presents with significant renal function impairment -Baseline renal function: 8 and 0.76 -Current renal function: 174 and 5.17 -Renal ultrasound -UA -Insert Foley catheter for accurate urinary output-requested for RN to notify MD if urinary output less than or equal to 30 cc/h -Continue volume resuscitation -Hold offending medications: Zaroxolyn, Demadex, and lisinopril -Patient reports poor fluid intake due to concerns over urinary incontinence and excessive urinary output in the context of diuretics and this (in combination with above-noted offending medications) is the likely precipitating event for the acute kidney injury. Subsequent renal function made worse by hypoperfusion and hypotension -Nephrology consulted; if urinary output does not improve within the next 6-8 hours patient may require short-term dialysis    Acute metabolic encephalopathy -Secondary to acute uremia and likely exacerbated by decreased clearance of preadmission medications (Neurontin, Vicodin and Lamictal) -Anticipate mentation will continue to improve with hydration and correction of renal function -I will allow clear liquids under direct supervision of nursing staff only and once patient fully alert can advance diet -Check TSH    Diabetes mellitus, insulin dependent (IDDM), controlled  -Glucose slightly elevated at 170 -Follow CBGs every 4 hours and provide SSI -Hold home  [place] for now -HgbA1c; HgbA1c was elevated at 11.5 in August    Hypertension -Patient currently was suboptimal and hypotensive blood pressure readings -Hold preadmission lisinopril and Norvasc as well as diuretics as above    Type 1 diabetes mellitus with left diabetic foot ulcer  -Has chronic heel ulcer -Foot is warm to touch with decreased pulse, bleeds easily and has central necrotic base; foul odor consistent with blood -WOC RN consultation for care recommendations    Sacral decubitus ulcer, stage II -Patient states is bedbound/wheelchair bound at baseline -Patient has wheelchair cushion but states needs replacement-have ordered chair GelPad replacement -Mattress overlay -WOC RN evaluation    Chronic diastolic heart failure, NYHA class 1  -Appears clinically compensated -Last echocardiogram was August 2018: Moderate concentric hypertrophy with EF 60-65% and grade 1 diastolic dysfunction without any valvular abnormalities. -Lasix, Zaroxolyn and ACE inhibitor on hold as above -Given presentation if diuretics are indicated in the future patient may benefit from Lasix alone since she has underlying normal renal function and only give diuretics for weight gain    High cholesterol -Hold preadmission statin for now -AST mildly elevated in the context of recent hypotension    Anemia, chronic disease -Baseline hemoglobin between 8.9 and 10.4 -Current hemoglobin stable but patient is also quite dehydrated and hemoconcentrated -Continue to follow CBC -Anemia panel    PAD (peripheral artery disease) s/p right BKA -nonambulatory at baseline      DVT prophylaxis: Subcutaneous heparin Code Status: Full Family Communication: No family at bedside Disposition Plan: SNF Consults called: Nephrology/[place]    Taveon Enyeart L. ANP-BC [facility] Pager [phone]   If 7PM-7AM, please contact night-coverage [email or web address] Password [email or web address]  11/24/2017, 3:36 PM

## 2017-11-24 NOTE — ED Notes (Signed)
Patient transported to Ultrasound 

## 2017-11-24 NOTE — ED Notes (Signed)
Pt bladder scanned, prior to foley insertion. Pt's highest scanned amount was:  425 ml.

## 2017-11-24 NOTE — Progress Notes (Signed)
Pharmacy Antibiotic Note  Laurie [name] is a 71 y.o. female admitted on 11/24/2017 with sepsis.  Pharmacy has been consulted for vancomycin and cefepime dosing. Pt is afebrile and WBC is mildly elevated at 10.8. SCr is significantly above baseline at 5.17.   Plan: Vancomycin 2gm IV x 1 - will follow-up Scr trend for further doses  Cefepime 2gm IV x 1 then 500mg  IV Q24H F/u renal fxn, C&S, clinical status and trough at SS  Height: 5\' 3"  (160 cm) Weight: 222 lb (100.7 kg) IBW/kg (Calculated) : 52.4  No data recorded.  Recent Labs  Lab 11/24/17 1314  LATICACIDVEN 2.30*    CrCl cannot be calculated (Patient's most recent lab result is older than the maximum 21 days allowed.).    Allergies  Allergen Reactions  . Penicillins Hives    Has patient had a PCN reaction causing immediate rash, facial/tongue/throat swelling, SOB or lightheadedness with hypotension: Yes Has patient had a PCN reaction causing severe rash involving mucus membranes or skin necrosis: No Has patient had a PCN reaction that required hospitalization: No Has patient had a PCN reaction occurring within the last 10 years: No If all of the above answers are "NO", then may proceed with Cephalosporin use.   Childhood allergy could have been more reaction only rem    Antimicrobials this admission: Vanc 12/4>> Cefepime 12/4>>  Dose adjustments this admission: N/A  Microbiology results: Pending  Thank you for allowing pharmacy to be a part of this patient's care.  Laurie [name], Laurie [name] 11/24/2017 1:20 PM

## 2017-11-24 NOTE — ED Notes (Signed)
IV attempt x2.

## 2017-11-24 NOTE — ED Notes (Signed)
Pt bladder scanned. Results from bladder scan was : 0 ml. Dr. Patria Maneampos aware.

## 2017-11-24 NOTE — ED Notes (Signed)
Pt's CBG result was 175. Informed HydrologistCricket - RN.

## 2017-11-24 NOTE — ED Notes (Signed)
Pt using a Purewick 

## 2017-11-25 ENCOUNTER — Other Ambulatory Visit: Payer: Self-pay

## 2017-11-25 DIAGNOSIS — I70244 Atherosclerosis of native arteries of left leg with ulceration of heel and midfoot: Secondary | ICD-10-CM

## 2017-11-25 LAB — RESPIRATORY PANEL BY PCR
ADENOVIRUS-RVPPCR: NOT DETECTED
BORDETELLA PERTUSSIS-RVPCR: NOT DETECTED
CHLAMYDOPHILA PNEUMONIAE-RVPPCR: NOT DETECTED
CORONAVIRUS HKU1-RVPPCR: NOT DETECTED
CORONAVIRUS NL63-RVPPCR: NOT DETECTED
Coronavirus 229E: NOT DETECTED
Coronavirus OC43: NOT DETECTED
INFLUENZA A-RVPPCR: NOT DETECTED
Influenza B: NOT DETECTED
METAPNEUMOVIRUS-RVPPCR: NOT DETECTED
Mycoplasma pneumoniae: NOT DETECTED
PARAINFLUENZA VIRUS 2-RVPPCR: NOT DETECTED
PARAINFLUENZA VIRUS 3-RVPPCR: NOT DETECTED
PARAINFLUENZA VIRUS 4-RVPPCR: NOT DETECTED
Parainfluenza Virus 1: NOT DETECTED
RHINOVIRUS / ENTEROVIRUS - RVPPCR: NOT DETECTED
Respiratory Syncytial Virus: NOT DETECTED

## 2017-11-25 LAB — GLUCOSE, CAPILLARY
GLUCOSE-CAPILLARY: 160 mg/dL — AB (ref 65–99)
Glucose-Capillary: 124 mg/dL — ABNORMAL HIGH (ref 65–99)
Glucose-Capillary: 156 mg/dL — ABNORMAL HIGH (ref 65–99)
Glucose-Capillary: 159 mg/dL — ABNORMAL HIGH (ref 65–99)
Glucose-Capillary: 185 mg/dL — ABNORMAL HIGH (ref 65–99)
Glucose-Capillary: 193 mg/dL — ABNORMAL HIGH (ref 65–99)

## 2017-11-25 LAB — COMPREHENSIVE METABOLIC PANEL
ALT: 26 U/L (ref 14–54)
ANION GAP: 19 — AB (ref 5–15)
AST: 57 U/L — ABNORMAL HIGH (ref 15–41)
Albumin: 2.8 g/dL — ABNORMAL LOW (ref 3.5–5.0)
Alkaline Phosphatase: 79 U/L (ref 38–126)
BILIRUBIN TOTAL: 0.7 mg/dL (ref 0.3–1.2)
BUN: 124 mg/dL — AB (ref 6–20)
CO2: 20 mmol/L — AB (ref 22–32)
Calcium: 8.3 mg/dL — ABNORMAL LOW (ref 8.9–10.3)
Chloride: 102 mmol/L (ref 101–111)
Creatinine, Ser: 2.62 mg/dL — ABNORMAL HIGH (ref 0.44–1.00)
GFR calc Af Amer: 20 mL/min — ABNORMAL LOW (ref >60)
GFR calc non Af Amer: 18 mL/min — ABNORMAL LOW (ref >60)
GLUCOSE: 148 mg/dL — AB (ref 65–99)
POTASSIUM: 3.1 mmol/L — AB (ref 3.5–5.1)
Sodium: 141 mmol/L (ref 135–145)
TOTAL PROTEIN: 7.6 g/dL (ref 6.5–8.1)

## 2017-11-25 LAB — CBC
HCT: 32.8 % — ABNORMAL LOW (ref 36.0–46.0)
HEMOGLOBIN: 10.6 g/dL — AB (ref 12.0–15.0)
MCH: 29.3 pg (ref 26.0–34.0)
MCHC: 32.3 g/dL (ref 30.0–36.0)
MCV: 90.6 fL (ref 78.0–100.0)
Platelets: 323 10*3/uL (ref 150–400)
RBC: 3.62 MIL/uL — ABNORMAL LOW (ref 3.87–5.11)
RDW: 13.7 % (ref 11.5–15.5)
WBC: 10.2 10*3/uL (ref 4.0–10.5)

## 2017-11-25 MED ORDER — POTASSIUM CHLORIDE CRYS ER 20 MEQ PO TBCR
60.0000 meq | EXTENDED_RELEASE_TABLET | Freq: Once | ORAL | Status: AC
  Administered 2017-11-25: 60 meq via ORAL
  Filled 2017-11-25: qty 3

## 2017-11-25 MED ORDER — DEXTROSE 5 % IV SOLN
1.0000 g | INTRAVENOUS | Status: DC
  Administered 2017-11-25 – 2017-11-27 (×3): 1 g via INTRAVENOUS
  Filled 2017-11-25 (×4): qty 1

## 2017-11-25 NOTE — Progress Notes (Signed)
PROGRESS NOTE    Druscilla Brownienne W Drach  ZOX:[id number] DOB: Jun 21, 1946 DOA: 11/24/2017 PCP: Rometta EmeryGarba, Mohammad L, MD  Brief Narrative: 71 y.o. female with medical history significant for chronic diastolic heart failure on ACE inhibitor, Zaroxolyn and Demadex, peripheral vascular disease with prior right BKA, chronic left heel ulcer, anemia of chronic kidney disease, diabetes on insulin, dyslipidemia, hypertension and narcolepsy.  She has had altered mentation since last Friday and was taken to her PCP office.  Subsequently the facility has been notified of abnormal renal function labs.  EMS was called to the nurse facility because of worsened mentation.  Patient's blood pressure was suboptimal and she was given 300 cc normal saline by the paramedics.  Upon presentation patient's initial blood pressure was 63/20 with a pulse of 111, she had a rectal temperature of 99.2.  Chest x-ray revealed possible right midlung zone pneumonia.  Patient had mild leukocytosis.  She has a chronic left heel ulcer which bleeds easily and partially covered by necrotic base and does have somewhat of a foul odor.  Left foot x-ray without fracture, dislocation or evidence of osteomyelitis.  Patient's lactic acid was mildly elevated at 2.3 in the context of severe acute kidney injury with a BUN of 174 and a creatinine of 5.17.  With elevated lactic acid, hypotension, acute kidney injury and possible findings of infection in the lung sepsis protocol was initiated by the EDP and she has been given a dose of broad-spectrum empiric antibiotics after blood cultures were obtained.  Patient has not made any urine despite fluid resuscitation.  Bladder scan showed no urine in the bladder.  Urinalysis ordered but has not yet been collected.  I have informed the nurse that I will place an order for Foley catheter insertion for accurate intake and output.    Assessment & Plan:   Principal Problem:   Sepsis due to pneumonia vs foot ulcer Active  Problems:   Acute kidney injury (HCC)   Diabetes mellitus, insulin dependent (IDDM), controlled (HCC)   Hypertension   Chronic diastolic heart failure, NYHA class 1 (HCC)   Type 1 diabetes mellitus with left diabetic foot ulcer (HCC)   High cholesterol   Anemia, chronic disease   PAD (peripheral artery disease) s/p right BKA   Sacral decubitus ulcer, stage II   Acute metabolic encephalopathy  1]change in mental status/sepsis-patient hypotensive tachy upon admit.  Secondary to possible right middle lobe pneumonia versus left heel unstageable infected pressure ulcer.  vascular surgery was consulted.  They feel patient is to be seen by surgery for debridement and moreover patient refuses left below-knee amputation.  Patient has a history of right BKA.  Will place a consult to surgery.   2] acute on chronic kidney disease renal functions improving on IV hydration.  Continue to hold diuretics and ACE inhibitor.  Renal ultrasound 2.3 cm calcified right renal focus again noted, possibly slightly larger than on prior CT of 08/02/2017. Possibly of renal artery aneurysm cannot be excluded. Other etiologies of calcified masses cannot be excluded. Further evaluation with CT or MRI can be obtained. The patient renal function line CTA can be performed.  2. No acute abnormality identified. No hydronephrosis. Bladder is nondistended 3] sacral decubitus ulcer-patient is wheelchair-bound or bedbound at baseline.  4] type II DM patient was taking Toujeo at home.  Continue SSI for now.  5]hypokalemia replete with caution with aki.   DVT prophylaxis heparin :Code Status full :Family Communication none Disposition Plan: tbd Consultants:  Nephro,vascular,surgery Procedures:none  Antimicrobials:vanc/cefepime  Subjective:asking for water.  Denies any complaints of chest pain shortness of breath nausea vomiting or diarrhea.   Objective: Patient resting in bed asking for water.after foley insertion  5500cc was the uop. Vitals:   11/25/17 0501 11/25/17 0608 11/25/17 0743 11/25/17 1147  BP: 133/61  127/62 (!) 121/57  Pulse: (!) 110  (!) 120 (!) 118  Resp: 15  11 11   Temp:  98.3 F (36.8 C) 98.9 F (37.2 C) 98.5 F (36.9 C)  TempSrc:  Oral Oral Oral  SpO2: 98%  97% 96%  Weight:      Height:        Intake/Output Summary (Last 24 hours) at 11/25/2017 1422 Last data filed at 11/25/2017 1100 Gross per 24 hour  Intake 5457.5 ml  Output 7450 ml  Net -1992.5 ml   Filed Weights   11/24/17 1153 11/24/17 2021 11/25/17 0352  Weight: 100.7 kg (222 lb) 91.3 kg (201 lb 4.5 oz) 88.8 kg (195 lb 12.3 oz)    Examination:  General exam: Appears calm and comfortable  Respiratory system: Clear to auscultation. Respiratory effort normal. Cardiovascular system: S1 & S2 heard, RRR. No JVD, murmurs, rubs, gallops or clicks. No pedal edema. Gastrointestinal system: Abdomen is nondistended, soft and nontender. No organomegaly or masses felt. Normal bowel sounds heard. Central nervous system: Alert and oriented. No focal neurological deficits. Extremities: Symmetric 5 x 5 power. Skin: No rashes, lesions or ulcers.left heel pressure ulcer dressing on. .     Data Reviewed: I have personally reviewed following labs and imaging studies  CBC: Recent Labs  Lab 11/24/17 1247 11/25/17 0346  WBC 10.8* 10.2  HGB 11.4* 10.6*  HCT 34.2* 32.8*  MCV 89.5 90.6  PLT 355 323   Basic Metabolic Panel: Recent Labs  Lab 11/24/17 1247 11/25/17 0346  NA 135 141  K 3.8 3.1*  CL 92* 102  CO2 21* 20*  GLUCOSE 170* 148*  BUN 174* 124*  CREATININE 5.17* 2.62*  CALCIUM 8.8* 8.3*   GFR: Estimated Creatinine Clearance: 23.2 mL/min (A) (by C-G formula based on SCr of 2.62 mg/dL (H)). Liver Function Tests: Recent Labs  Lab 11/24/17 1247 11/25/17 0346  AST 50* 57*  ALT 22 26  ALKPHOS 92 79  BILITOT 0.8 0.7  PROT 8.1 7.6  ALBUMIN 3.0* 2.8*   No results for input(s): LIPASE, AMYLASE in the last 168  hours. No results for input(s): AMMONIA in the last 168 hours. Coagulation Profile: Recent Labs  Lab 11/24/17 1744  INR 1.21   Cardiac Enzymes: Recent Labs  Lab 11/24/17 1744  CKTOTAL 3,946*   BNP (last 3 results) No results for input(s): PROBNP in the last 8760 hours. HbA1C: Recent Labs    11/24/17 1744  HGBA1C 9.5*   CBG: Recent Labs  Lab 11/24/17 1803 11/25/17 0005 11/25/17 0459 11/25/17 0822 11/25/17 1209  GLUCAP 155* 156* 160* 124* 159*   Lipid Profile: No results for input(s): CHOL, HDL, LDLCALC, TRIG, CHOLHDL, LDLDIRECT in the last 72 hours. Thyroid Function Tests: Recent Labs    11/24/17 1744  TSH 0.091*   Anemia Panel: Recent Labs    11/24/17 1744  VITAMINB12 1,031*  FOLATE 56.7  FERRITIN 85  TIBC 265  IRON 28  RETICCTPCT 0.7   Sepsis Labs: Recent Labs  Lab 11/24/17 1314 11/24/17 1744 11/24/17 2049  PROCALCITON  --  0.46  --   LATICACIDVEN 2.30* 1.0 1.1    No results found for this or any previous visit (from the past  240 hour(s)).       Radiology Studies: Dg Chest 2 View  Result Date: 11/24/2017 CLINICAL DATA:  Altered mental status, abnormal kidney functions EXAM: CHEST  2 VIEW COMPARISON:  Portable chest x-ray of 07/30/2017 FINDINGS: Although the previously described airspace disease has significantly improved there is patchy opacity remaining in the periphery of the right mid lung. Continued followup is recommended. No definite pleural effusion is seen. Mediastinal and hilar contours are unremarkable and mild cardiomegaly is stable. No bony abnormality is noted. IMPRESSION: Minimal airspace disease remains in the periphery of the right mid lung possibly representing pneumonia. Recommend continued followup. Electronically Signed   By: Dwyane Dee M.D.   On: 11/24/2017 14:26   US Renal  Result Date: 11/24/2017 CLINICAL DATA:  Acute renal insufficiency. EXAM: RENAL / URINARY TRACT ULTRASOUND COMPLETE COMPARISON:  MRI 08/01/2017.  CT  08/02/2017. FINDINGS: Right Kidney: Length: 11.2 cm. Echogenicity within normal limits. No mass or hydronephrosis visualized. 2.3 cm calcified right renal focus,, possibly slightly larger than on prior CT of 07/23/2017. Possibility of renal artery aneurysm cannot be excluded. Further evaluation with CT or MRI can be obtained. If the patient's renal function allows CTA can be performed. Left Kidney: Length: 11.1 cm. Echogenicity within normal limits. No mass or hydronephrosis. Bladder: Bladder nondistended.  Patient previously voided. IMPRESSION: 1. 2.3 cm calcified right renal focus again noted, possibly slightly larger than on prior CT of 08/02/2017. Possibly of renal artery aneurysm cannot be excluded. Other etiologies of calcified masses cannot be excluded. Further evaluation with CT or MRI can be obtained. The patient renal function line CTA can be performed. 2. No acute abnormality identified. No hydronephrosis. Bladder is nondistended. Electronically Signed   By: Maisie Fus  Register   On: 11/24/2017 16:17   Dg Foot Complete Left  Result Date: 11/24/2017 CLINICAL DATA:  71 year old diabetic female with open wound left foot. Initial encounter. EXAM: LEFT FOOT - COMPLETE 3+ VIEW COMPARISON:  None. FINDINGS: Ulceration inferior to plantar spur.  No adjacent osteomyelitis. Since the prior examination, change in configuration of posterior aspect of calcaneus superior to the Achilles tendon insertion site. This does not have typical configuration of osteomyelitis. No fracture or dislocation. IMPRESSION: Ulceration inferior to plantar spur.  No adjacent osteomyelitis. Since the prior examination, change in configuration of posterior aspect of calcaneus superior to the Achilles tendon insertion site. This does not have typical configuration of osteomyelitis. If further delineation were clinically desired, MR imaging may prove more sensitive. Electronically Signed   By: Lacy Duverney M.D.   On: 11/24/2017 14:25         Scheduled Meds: . amphetamine-dextroamphetamine  30 mg Oral Q breakfast  . clopidogrel  75 mg Oral Daily  . DULoxetine  60 mg Oral Daily  . famotidine  20 mg Oral Daily  . heparin  5,000 Units Subcutaneous Q8H  . insulin aspart  0-15 Units Subcutaneous Q4H  . sodium chloride flush  3 mL Intravenous Q12H   Continuous Infusions: . sodium chloride 75 mL/hr at 11/25/17 1200  . ceFEPime (MAXIPIME) IV       LOS: 1 day      Alwyn Ren, MD [facility] If 7PM-7AM, please contact night-coverage [email or web address] Password [email or web address] 11/25/2017, 2:22 PM

## 2017-11-25 NOTE — Progress Notes (Signed)
Pharmacy Antibiotic Note  Laurie [name] is a 71 y.o. female admitted on 11/24/2017 with AKI and AMS.     Day # 2 Vancomycin and Cefepime for sepsis coverage due to pneumonia vs foot ulcer.   Vancomycin 2gm IV x 1 and Cefepime 2gm IV x 1 given on 12/4.    Creatinine down 5.17>2.62.  Holding diuretics and ACEi/other BP meds.  Noted UOP of 5500 ml after foley placed.  Tmax 99.2, WBC 10.2, LA 2.3>1.1,  ESR >120.  Plan:  Adjust Cefepime maintenance dose from 500 mg to 1gm IV q24hrs.  Not on a scheduled Vancomycin dose due to AKI.  No Vancomycin today, but expect dose will be needed on 12/6.  Follow renal function, culture data, progress.    Height: 5' 7"  (170.2 cm) Weight: 195 lb 12.3 oz (88.8 kg) IBW/kg (Calculated) : 61.6  Temp (24hrs), Avg:98.7 F (37.1 C), Min:98.3 F (36.8 C), Max:99.2 F (37.3 C)  Recent Labs  Lab 11/24/17 1247 11/24/17 1314 11/24/17 1744 11/24/17 2049 11/25/17 0346  WBC 10.8*  --   --   --  10.2  CREATININE 5.17*  --   --   --  2.62*  LATICACIDVEN  --  2.30* 1.0 1.1  --     Estimated Creatinine Clearance: 23.2 mL/min (A) (by C-G formula based on SCr of 2.62 mg/dL (H)).    Allergies  Allergen Reactions  . Penicillins Hives    Has patient had a PCN reaction causing immediate rash, facial/tongue/throat swelling, SOB or lightheadedness with hypotension: Yes Has patient had a PCN reaction causing severe rash involving mucus membranes or skin necrosis: No Has patient had a PCN reaction that required hospitalization: No Has patient had a PCN reaction occurring within the last 10 years: No If all of the above answers are "NO", then may proceed with Cephalosporin use.   Childhood allergy could have been more reaction only rem   - has tolerated Cephalosporins in the past  Antimicrobials this admission:   Vancomycin 12/4>>   Cefepime 12/4>>  Dose adjustments this admission:   12/5:  Creatinine improved, Cefepime changed from 500 mg to 1 gm IV  q24hrs  Microbiology results:  12/4 urine -  12/4 blood x 2 -  12/4 respiratory panel  Thank you for allowing pharmacy to be a part of this patient's care.  Arty Baumgartner, [place] Pager: [phone] 11/25/2017 12:20 PM

## 2017-11-25 NOTE — Progress Notes (Signed)
Subjective:  5500 of UOP after foley insetion !! Renal u/s showed calcified renal focus maybe bigger suggest further imaging- she is more alert "dont be talking about amputation" Objective Vital signs in last 24 hours: Vitals:   11/25/17 0437 11/25/17 0501 11/25/17 0608 11/25/17 0743  BP: 135/67 133/61  127/62  Pulse: (!) 111 (!) 110  (!) 120  Resp: (!) 26 15  11   Temp:   98.3 F (36.8 C) 98.9 F (37.2 C)  TempSrc:   Oral Oral  SpO2: 92% 98%  97%  Weight:      Height:       Weight change:   Intake/Output Summary (Last 24 hours) at 11/25/2017 1138 Last data filed at 11/25/2017 1000 Gross per 24 hour  Intake 5507.5 ml  Output 6250 ml  Net -742.5 ml    Assessment/Plan: 71 year old female on extensive blood pressure regimen as outpatient including very high dose diuretics.  She now presents with hypotension and AKI 1.Renal-baseline normal renal function 4 months ago.  urinalysis very dirty and cellular- culture pending but now urine very clear.  Renal ultrasound as above.  I suspected ATN secondary to hypotension in the setting of extensive blood pressure regimen and diuretics including an ACE inhibitor.  All of these medications on hold and getting hydration.  Numbers much improved today. Even though BUN is very high I suspect it will cont to improve so there is not any acute need for dialysis at this time.  I hope that we will be able to escape that in this nice lady 2. Hypertension/volume  -hypotensive and appears volume depleted.  Getting volume repleted at this time 150 per hour- will dec to 75 per hour as suspect she is prone to overload given how much diuretics she was on.  HR is high possibly an atrial arrythmia- potassium given  3. Anemia  -decreaseing with hydration, no action needed yet. 4.  ID-being started on Vanco and Maxipime for possible pneumonia/UTI vs foot wound 5. Renal abnormalitly- table for now 6. Hypokalemia- given 60 oral K today     Cliford Sequeira  A    Labs: Basic Metabolic Panel: Recent Labs  Lab 11/24/17 1247 11/25/17 0346  NA 135 141  K 3.8 3.1*  CL 92* 102  CO2 21* 20*  GLUCOSE 170* 148*  BUN 174* 124*  CREATININE 5.17* 2.62*  CALCIUM 8.8* 8.3*   Liver Function Tests: Recent Labs  Lab 11/24/17 1247 11/25/17 0346  AST 50* 57*  ALT 22 26  ALKPHOS 92 79  BILITOT 0.8 0.7  PROT 8.1 7.6  ALBUMIN 3.0* 2.8*   No results for input(s): LIPASE, AMYLASE in the last 168 hours. No results for input(s): AMMONIA in the last 168 hours. CBC: Recent Labs  Lab 11/24/17 1247 11/25/17 0346  WBC 10.8* 10.2  HGB 11.4* 10.6*  HCT 34.2* 32.8*  MCV 89.5 90.6  PLT 355 323   Cardiac Enzymes: Recent Labs  Lab 11/24/17 1744  CKTOTAL 3,946*   CBG: Recent Labs  Lab 11/24/17 1333 11/24/17 1803 11/25/17 0005 11/25/17 0459 11/25/17 0822  GLUCAP 175* 155* 156* 160* 124*    Iron Studies:  Recent Labs    11/24/17 1744  IRON 28  TIBC 265  FERRITIN 85   Studies/Results: Dg Chest 2 View  Result Date: 11/24/2017 CLINICAL DATA:  Altered mental status, abnormal kidney functions EXAM: CHEST  2 VIEW COMPARISON:  Portable chest x-ray of 07/30/2017 FINDINGS: Although the previously described airspace disease has significantly improved there is  patchy opacity remaining in the periphery of the right mid lung. Continued followup is recommended. No definite pleural effusion is seen. Mediastinal and hilar contours are unremarkable and mild cardiomegaly is stable. No bony abnormality is noted. IMPRESSION: Minimal airspace disease remains in the periphery of the right mid lung possibly representing pneumonia. Recommend continued followup. Electronically Signed   By: Dwyane DeePaul  Barry M.D.   On: 11/24/2017 14:26   [place] Renal  Result Date: 11/24/2017 CLINICAL DATA:  Acute renal insufficiency. EXAM: RENAL / URINARY TRACT ULTRASOUND COMPLETE COMPARISON:  MRI 08/01/2017.  CT 08/02/2017. FINDINGS: Right Kidney: Length: 11.2 cm. Echogenicity within  normal limits. No mass or hydronephrosis visualized. 2.3 cm calcified right renal focus,, possibly slightly larger than on prior CT of 07/23/2017. Possibility of renal artery aneurysm cannot be excluded. Further evaluation with CT or MRI can be obtained. If the patient's renal function allows CTA can be performed. Left Kidney: Length: 11.1 cm. Echogenicity within normal limits. No mass or hydronephrosis. Bladder: Bladder nondistended.  Patient previously voided. IMPRESSION: 1. 2.3 cm calcified right renal focus again noted, possibly slightly larger than on prior CT of 08/02/2017. Possibly of renal artery aneurysm cannot be excluded. Other etiologies of calcified masses cannot be excluded. Further evaluation with CT or MRI can be obtained. The patient renal function line CTA can be performed. 2. No acute abnormality identified. No hydronephrosis. Bladder is nondistended. Electronically Signed   By: Maisie Fushomas  Register   On: 11/24/2017 16:17   Dg Foot Complete Left  Result Date: 11/24/2017 CLINICAL DATA:  71 year old diabetic female with open wound left foot. Initial encounter. EXAM: LEFT FOOT - COMPLETE 3+ VIEW COMPARISON:  None. FINDINGS: Ulceration inferior to plantar spur.  No adjacent osteomyelitis. Since the prior examination, change in configuration of posterior aspect of calcaneus superior to the Achilles tendon insertion site. This does not have typical configuration of osteomyelitis. No fracture or dislocation. IMPRESSION: Ulceration inferior to plantar spur.  No adjacent osteomyelitis. Since the prior examination, change in configuration of posterior aspect of calcaneus superior to the Achilles tendon insertion site. This does not have typical configuration of osteomyelitis. If further delineation were clinically desired, MR imaging may prove more sensitive. Electronically Signed   By: Lacy DuverneySteven  Olson M.D.   On: 11/24/2017 14:25   Medications: Infusions: . sodium chloride 150 mL/hr at 11/25/17 1000     Scheduled Medications: . amphetamine-dextroamphetamine  30 mg Oral Q breakfast  . ceFEPime (MAXIPIME) IV  500 mg Intravenous Q24H  . clopidogrel  75 mg Oral Daily  . DULoxetine  60 mg Oral Daily  . famotidine  20 mg Oral Daily  . heparin  5,000 Units Subcutaneous Q8H  . insulin aspart  0-15 Units Subcutaneous Q4H  . sodium chloride flush  3 mL Intravenous Q12H    have reviewed scheduled and prn medications.  Physical Exam: General: more alert, some tangential conversation Heart: tachy Lungs: mostly vclear Abdomen: soft, non tender Extremities: no peripheral edema yet, ulcer on heel- looks necrotic    11/25/2017,11:38 AM  LOS: 1 day

## 2017-11-25 NOTE — Progress Notes (Signed)
Patient had 5 beat v tach with wide qrs. Patient asymptomatic and vital signs stable. K=3.1. Notified MD, received order for 60mg  po potassium. Potassium given. Will continue to monitor.

## 2017-11-25 NOTE — Consult Note (Signed)
WOC Nurse wound consult note Reason for Consult:left heel unstageable, sacrum stage II Wound type: pressure, MASD, poor perfusion. Pressure Injury POA: Yes Measurement:left heel has 12cm x 12cm x unknown depth unstageable that covers entire heel with 4cm round black dry eschar on lateral side of wound. Wound bed: 30% black dry hard eschar 70% boggy hanging bloody sac un-stageable wound with copious bloody and serosanguinous malodorous drainage. Sacrum has 4.5cm x 1cm x 0.1cm linear pressure/MASD wound with 80% pink wound bed, 20% white slough, no drainage or odor noted.  Drainage (amount, consistency, odor) see above Periwound:see above Dressing procedure/placement/frequency: I have provided nurses with orders for: To left heel wound, cleanse with NS, pat dry, cover wound with Xeroform [place] # [id number]), wrap heel with kerlix, perform twice daily. To sacral ulcer, cleanse with NS, pat dry, apply NS moistened gauze, change twice daily. Patient has poor perfusion compounding the problem with the heel ulcer. Osteomyelitis has already been ruled out. Would recommend a Vascular Surgical Consult, please order if you agree. Prevalon Boot [place] # [phone]) has been ordered for offloading pressure. We will not follow, but will remain available to this patient, to nursing, and the medical and/or surgical teams.  Please re-consult if we need to assist further.   Barnett HatterMelinda Jalaya Sarver, RN-C, WTA-C Wound Treatment Associate

## 2017-11-25 NOTE — Consult Note (Signed)
[facility] NOTE   MRN : [id number]  Reason for Consult: Left heel chronic ulcer Referring Physician: IM  History of Present Illness: Laurie [name] is a 71 y.o. female who is known to our service and was last seen 02/19/2017 s/p Left femoral to posterior tibial bypass with reverse great saph vein 03/06/2015.She returns today for follow-up.She has had a previous right BKA. She is nonambulatory.   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 She has never been fitted for prosthetic on the right leg. She is fairly unstable and has had several falls. She does not complain of rest pain or ulcers in the left foot. She has chronic edema in the left leg. She now has developed a large left heel decubitus ulcer. She is currently being followed at the wound center.          She was brought to the ED secondary to Altered mentation and reported abnormal renal function from PCP office.  Past medical history includes:  chronic diastolic heart failure on ACE inhibitor, Zaroxolyn and Demadex, peripheral vascular disease with prior right BKA, chronic left heel ulcer, anemia of chronic kidney disease, diabetes on insulin, dyslipidemia, hypertension and narcolepsy.       Current Facility-Administered Medications  Medication Dose Route Frequency Provider Last Rate Last Dose  . 0.9 %  sodium chloride infusion   Intravenous Continuous Russella Dar, NP 150 mL/hr at 11/25/17 1000    . acetaminophen (TYLENOL) tablet 650 mg  650 mg Oral Q6H PRN Russella Dar, NP       Or  . acetaminophen (TYLENOL) suppository 650 mg  650 mg Rectal Q6H PRN Russella Dar, NP      . amphetamine-dextroamphetamine (ADDERALL) tablet 30 mg  30 mg Oral Q breakfast Russella Dar, NP   30 mg at 11/25/17 1478  . ceFEPIme (MAXIPIME) 500 mg in dextrose 5 % 50 mL IVPB  500 mg Intravenous Q24H Rumbarger, Faye Ramsay, RPH      . clopidogrel (PLAVIX) tablet 75 mg  75 mg Oral Daily Russella Dar,  NP   75 mg at 11/25/17 2956  . DULoxetine (CYMBALTA) DR capsule 60 mg  60 mg Oral Daily Russella Dar, NP   60 mg at 11/25/17 2130  . famotidine (PEPCID) tablet 20 mg  20 mg Oral Daily Russella Dar, NP   20 mg at 11/25/17 0833  . heparin injection 5,000 Units  5,000 Units Subcutaneous Q8H Russella Dar, NP   5,000 Units at 11/25/17 0504  . insulin aspart (novoLOG) injection 0-15 Units  0-15 Units Subcutaneous Q4H Russella Dar, NP   2 Units at 11/25/17 0950  . ipratropium-albuterol (DUONEB) 0.5-2.5 (3) MG/3ML nebulizer solution 3 mL  3 mL Nebulization Q6H PRN Russella Dar, NP      . ondansetron (ZOFRAN) tablet 4 mg  4 mg Oral Q6H PRN Russella Dar, NP       Or  . ondansetron [facility]) injection 4 mg  4 mg Intravenous Q6H PRN Russella Dar, NP      . sodium chloride flush (NS) 0.9 % injection 3 mL  3 mL Intravenous Q12H Russella Dar, NP   3 mL at 11/24/17 2142    Pt meds include: Statin :Yes Betablocker: No ASA: No Other anticoagulants/antiplatelets: Plavix  Past Medical History:  Diagnosis Date  . Anemia   . Anxiety   . Bacteremia   . Bursitis    "  left hip; sometimes left shoulder" (03/05/2015)  . Chest pain    Troponins up to 0.12 while hospitalized for E.coli PNA  . Depression   . Family history of adverse reaction to anesthesia    "brother died 07/12/2013; woke up w/headache, vomited blood; drilled holes in his head; bleeding stroke; never came back" (03/05/2015)  . Headache    "when someone gets on my nerves" (03/05/2015)  . High cholesterol   . Hypertension   . Narcolepsy   . 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 Stroke [facility]) 10/2014   "weaker on left side since" (03/05/2015)  . Type II diabetes mellitus (HCC)   . Walking pneumonia 04/2009   "double" /notes 04/23/2011    Past Surgical History:  Procedure Laterality Date  . ABDOMINAL AORTAGRAM N/A 03/05/2015   Procedure: ABDOMINAL Ronny Flurry;  Surgeon: Chuck Hint, MD;   Location: [facility] CATH LAB;  Service: Cardiovascular;  Laterality: N/A;  . AMPUTATION Right 11/03/2014   Procedure: AMPUTATION BELOW KNEE;  Surgeon: Sherren Kerns, MD;  Location: [facility] OR;  Service: Vascular;  Laterality: Right;  . FEMORAL-TIBIAL BYPASS GRAFT Left 03/06/2015   Procedure: BYPASS GRAFT Left FEMORAL-POSTERIOR TIBIAL ARTERY with reversed saphenous vein;  Surgeon: Sherren Kerns, MD;  Location: [facility] OR;  Service: Vascular;  Laterality: Left;  . INTRAOPERATIVE ARTERIOGRAM Left 03/06/2015   Procedure: INTRA OPERATIVE ARTERIOGRAM;  Surgeon: Sherren Kerns, MD;  Location[facility];  Service: Vascular;  Laterality: Left;  . IR FLUORO GUIDE CV MIDLINE PICC RIGHT  07/29/2017  . IR US GUIDE VASC ACCESS RIGHT  07/29/2017  . IRRIGATION AND DEBRIDEMENT FOOT Left 12/05/2016   Procedure: IRRIGATION AND DEBRIDEMENT FOOT;  Surgeon: Gwyneth Revels, DPM;  Location: [facility] ORS;  Service: Podiatry;  Laterality: Left;  . LOWER EXTREMITY ANGIOGRAM N/A 10/03/2014   Procedure: LOWER EXTREMITY ANGIOGRAM;  Surgeon: Pamella Pert, MD;  Location: [facility] CATH LAB;  Service: Cardiovascular;  Laterality: N/A;  . PERIPHERAL VASCULAR CATHETERIZATION Left 12/03/2016   Procedure: Lower Extremity Angiography;  Surgeon: Annice Needy, MD;  Location: [facility] INVASIVE CV LAB;  Service: Cardiovascular;  Laterality: Left;    Social History Social History   Tobacco Use  . Smoking status: Former Smoker    Packs/day: 0.10    Years: 40.00    Pack years: 4.00    Types: Cigarettes    Last attempt to quit: 11/21/2016    Years since quitting: 1.0  . Smokeless tobacco: Never Used  Substance Use Topics  . Alcohol use: Yes    Alcohol/week: 0.0 oz    Comment: 03/05/2015 "Might have a drink on holidays"  . Drug use: No    Family History Family History  Problem Relation Age of Onset  . CAD Mother   . CAD Father     Allergies  Allergen Reactions  . Penicillins Hives    Has patient had a PCN reaction causing immediate rash,  facial/tongue/throat swelling, SOB or lightheadedness with hypotension: Yes Has patient had a PCN reaction causing severe rash involving mucus membranes or skin necrosis: No Has patient had a PCN reaction that required hospitalization: No Has patient had a PCN reaction occurring within the last 10 years: No If all of the above answers are "NO", then may proceed with Cephalosporin use.   Childhood allergy could have been more reaction only rem     REVIEW OF SYSTEMS  General: [ ]  Weight loss, [ ]  Fever, [ ]  chills Neurologic: [ ]   Dizziness, [ ]  Blackouts, [ ]  Seizure [ ]  Stroke, [ ]  "Mini stroke", [ ]  Slurred speech, [ ]  Temporary blindness; [ ]  weakness in arms or legs, [ ]  Hoarseness [ ]  Dysphagia Cardiac: [ ]  Chest pain/pressure, [ ]  Shortness of breath at rest [ ]  Shortness of breath with exertion, [ ]  Atrial fibrillation or irregular heartbeat  Vascular: [ ]  Pain in legs with walking, [ ]  Pain in legs at rest, [ ]  Pain in legs at night,  [ ]  Non-healing ulcer, [ ]  Blood clot in vein/DVT,   Pulmonary: [ ]  Home oxygen, [ ]  Productive cough, [ ]  Coughing up blood, [ ]  Asthma,  [ ]  Wheezing [ ]  COPD Musculoskeletal:  [ ]  Arthritis, [ ]  Low back pain, [ ]  Joint pain Hematologic: [ ]  Easy Bruising, [ ]  Anemia; [ ]  Hepatitis Gastrointestinal: [ ]  Blood in stool, [ ]  Gastroesophageal Reflux/heartburn, Urinary: [ ]  chronic Kidney disease, [ x] on HD - [ ]  MWF or [ ]  TTHS, [ ]  Burning with urination, [ ]  Difficulty urinating Skin: [ ]  Rashes, [ x] Wounds Psychological: [ ]  Anxiety, [ ]  Depression  Physical Examination Vitals:   11/25/17 0437 11/25/17 0501 11/25/17 0608 11/25/17 0743  BP: 135/67 133/61  127/62  Pulse: (!) 111 (!) 110  (!) 120  Resp: (!) 26 15  11   Temp:   98.3 F (36.8 C) 98.9 F (37.2 C)  TempSrc:   Oral Oral  SpO2: 92% 98%  97%  Weight:      Height:       Body mass index is 30.66 kg/m.  General:  WDWN in NAD Gait: Normal HENT: WNL Eyes: Pupils  equal Pulmonary: normal non-labored breathing , without Rales, rhonchi,  wheezing Cardiac: RRR, without  Murmurs, rubs or gallops; No carotid bruits Abdomen: soft, NT, no masses Skin: Measurement:left heel has 12cm x 12cm x unknown depth unstageable that covers entire heel with 4cm round black dry eschar on lateral side of wound. Wound bed: 30% black dry hard eschar 70% boggy hanging bloody sac un-stageable wound with copious bloody and serosanguinous malodorous drainage. Vascular Exam/Pulses:radial pulse, no pedal pulse Musculoskeletal: positive muscle wasting with left chronic knee contractureor atrophy; no edema  Neurologic: A&O X 3; Appropriate Affect ;  SENSATION: normal; MOTOR FUNCTION: 5/5 Symmetric Speech is fluent/normal   Significant Diagnostic Studies: CBC Lab Results  Component Value Date   WBC 10.2 11/25/2017   HGB 10.6 (L) 11/25/2017   HCT 32.8 (L) 11/25/2017   MCV 90.6 11/25/2017   PLT 323 11/25/2017    BMET    Component Value Date/Time   NA 141 11/25/2017 0346   K 3.1 (L) 11/25/2017 0346   CL 102 11/25/2017 0346   CO2 20 (L) 11/25/2017 0346   GLUCOSE 148 (H) 11/25/2017 0346   BUN 124 (H) 11/25/2017 0346   CREATININE 2.62 (H) 11/25/2017 0346   CALCIUM 8.3 (L) 11/25/2017 0346   GFRNONAA 18 (L) 11/25/2017 0346   GFRAA 20 (L) 11/25/2017 0346   Estimated Creatinine Clearance: 23.2 mL/min (A) (by C-G formula based on SCr of 2.62 mg/dL (H)).  COAG Lab Results  Component Value Date   INR 1.21 11/24/2017   INR 1.11 07/23/2017   INR 1.03 10/03/2014   Left foot x ray  IMPRESSION: Ulceration inferior to plantar spur.  No adjacent osteomyelitis.  Since the prior examination, change in configuration of posterior aspect of calcaneus superior to the Achilles tendon insertion site. This does not have typical  configuration of osteomyelitis.   ASSESSMENT/PLAN:   Sepsis secondary to a combination of respiratory infection/Rt sided PNA and left heel infected  ulcer-IV vancomycin and cefepime pending cultures, wound care consult requested, consider surgical consult for possible debridement.   WBC 10.2 Tm 99.2  Due to her current state of debility,she is not a candidate for further revascularization. She was offered left BKA and she does not want to proceed.      Laurie [name] 11/25/2017 11:18 AM  Patient admitted for sepsis due to pneumonia vs left heel ulcer.  She is not interested in amputation.  I will check a duplex tomorrow to determine patency of BPG.  Continue with wound care to heel with pressure off-loading.  WElls Mayelin Panos

## 2017-11-26 DIAGNOSIS — I96 Gangrene, not elsewhere classified: Secondary | ICD-10-CM

## 2017-11-26 LAB — CBC WITH DIFFERENTIAL/PLATELET
BASOS PCT: 0 %
Basophils Absolute: 0 10*3/uL (ref 0.0–0.1)
EOS ABS: 0 10*3/uL (ref 0.0–0.7)
EOS PCT: 0 %
HCT: 34.7 % — ABNORMAL LOW (ref 36.0–46.0)
HEMOGLOBIN: 11.3 g/dL — AB (ref 12.0–15.0)
Lymphocytes Relative: 16 %
Lymphs Abs: 1.5 10*3/uL (ref 0.7–4.0)
MCH: 29.9 pg (ref 26.0–34.0)
MCHC: 32.6 g/dL (ref 30.0–36.0)
MCV: 91.8 fL (ref 78.0–100.0)
MONOS PCT: 6 %
Monocytes Absolute: 0.5 10*3/uL (ref 0.1–1.0)
NEUTROS PCT: 78 %
Neutro Abs: 7.1 10*3/uL (ref 1.7–7.7)
PLATELETS: 332 10*3/uL (ref 150–400)
RBC: 3.78 MIL/uL — AB (ref 3.87–5.11)
RDW: 13.9 % (ref 11.5–15.5)
WBC: 8.5 10*3/uL (ref 4.0–10.5)

## 2017-11-26 LAB — GLUCOSE, CAPILLARY
GLUCOSE-CAPILLARY: 203 mg/dL — AB (ref 65–99)
GLUCOSE-CAPILLARY: 158 mg/dL — AB (ref 65–99)
Glucose-Capillary: 150 mg/dL — ABNORMAL HIGH (ref 65–99)
Glucose-Capillary: 156 mg/dL — ABNORMAL HIGH (ref 65–99)
Glucose-Capillary: 185 mg/dL — ABNORMAL HIGH (ref 65–99)
Glucose-Capillary: 160 mg/dL — ABNORMAL HIGH (ref 65–99)

## 2017-11-26 LAB — URINE CULTURE: Culture: >=100000 — AB

## 2017-11-26 LAB — MAGNESIUM: Magnesium: 1.6 mg/dL — ABNORMAL LOW (ref 1.7–2.4)

## 2017-11-26 LAB — RENAL FUNCTION PANEL
Albumin: 2.8 g/dL — ABNORMAL LOW (ref 3.5–5.0)
Anion gap: 15 (ref 5–15)
BUN: 57 mg/dL — ABNORMAL HIGH (ref 6–20)
CALCIUM: 9 mg/dL (ref 8.9–10.3)
CO2: 25 mmol/L (ref 22–32)
CREATININE: 1.15 mg/dL — AB (ref 0.44–1.00)
Chloride: 106 mmol/L (ref 101–111)
GFR, EST AFRICAN AMERICAN: 56 mL/min — AB (ref >60)
GFR, EST NON AFRICAN AMERICAN: 48 mL/min — AB (ref >60)
Glucose, Bld: 199 mg/dL — ABNORMAL HIGH (ref 65–99)
Phosphorus: 1.9 mg/dL — ABNORMAL LOW (ref 2.5–4.6)
Potassium: 3.5 mmol/L (ref 3.5–5.1)
SODIUM: 146 mmol/L — AB (ref 135–145)

## 2017-11-26 LAB — MRSA PCR SCREENING: MRSA BY PCR: POSITIVE — AB

## 2017-11-26 MED ORDER — MUPIROCIN 2 % EX OINT
1.0000 "application " | TOPICAL_OINTMENT | Freq: Two times a day (BID) | CUTANEOUS | Status: AC
  Administered 2017-11-26 – 2017-11-30 (×9): 1 via NASAL
  Filled 2017-11-26 (×3): qty 22

## 2017-11-26 MED ORDER — MAGNESIUM SULFATE 2 GM/50ML IV SOLN
2.0000 g | Freq: Once | INTRAVENOUS | Status: AC
  Administered 2017-11-26: 2 g via INTRAVENOUS
  Filled 2017-11-26: qty 50

## 2017-11-26 MED ORDER — SODIUM CHLORIDE 0.45 % IV SOLN
INTRAVENOUS | Status: DC
  Administered 2017-11-26 – 2017-11-27 (×3): via INTRAVENOUS

## 2017-11-26 MED ORDER — SODIUM CHLORIDE 0.9 % IV SOLN
1500.0000 mg | INTRAVENOUS | Status: DC
  Administered 2017-11-26 – 2017-11-28 (×3): 1500 mg via INTRAVENOUS
  Filled 2017-11-26 (×3): qty 1500

## 2017-11-26 MED ORDER — CHLORHEXIDINE GLUCONATE CLOTH 2 % EX PADS
6.0000 | MEDICATED_PAD | Freq: Every day | CUTANEOUS | Status: AC
  Administered 2017-11-26 – 2017-11-30 (×5): 6 via TOPICAL

## 2017-11-26 MED ORDER — METOPROLOL TARTRATE 25 MG PO TABS
25.0000 mg | ORAL_TABLET | Freq: Two times a day (BID) | ORAL | Status: DC
  Administered 2017-11-26 – 2017-11-27 (×4): 25 mg via ORAL
  Filled 2017-11-26 (×4): qty 1

## 2017-11-26 NOTE — Progress Notes (Signed)
Subjective:  6100 of UOP last 24 hours  - pt upset at the prospect of amputation  Objective Vital signs in last 24 hours: Vitals:   11/26/17 0012 11/26/17 0412 11/26/17 0427 11/26/17 0811  BP: (!) 151/74 (!) 137/93  (!) 160/79  Pulse: 99 (!) 102  (!) 105  Resp: 14 14  20   Temp:    99 F (37.2 C)  TempSrc:    Axillary  SpO2: 97% 99%  99%  Weight:   88.4 kg (194 lb 14.2 oz)   Height:       Weight change: -12.298 kg (-1.8 oz)  Intake/Output Summary (Last 24 hours) at 11/26/2017 1109 Last data filed at 11/26/2017 0600 Gross per 24 hour  Intake 653 ml  Output 4226 ml  Net -3573 ml    Assessment/Plan: 71 year old female on extensive blood pressure regimen as outpatient including very high dose diuretics.  She now presents with hypotension and AKI 1.Renal-baseline normal renal function 4 months ago.  urinalysis very dirty and cellular- culture pending but now urine very clear.  Renal ultrasound as above.  I suspected ATN secondary to hypotension in the setting of extensive blood pressure regimen and diuretics including an ACE inhibitor.  All of these medications on hold and getting hydration.  Numbers really normal today. Renal will sign off, call with questions 2. Hypertension/volume  -hypotensive and appears volume depleted.  Getting volume repleted at this time 75 per hour - suspect she is prone to overload given how much diuretics she was on so give cautiously but given so much UOP for now will keep going.  HR is high possibly an atrial arrythmia- add back antihypertensives as needed  3. Anemia  -decreaseing with hydration, no action needed yet. 4.  ID-being started on Vanco and Maxipime for possible pneumonia/UTI vs foot wound 5. Renal abnormalitly- table for now 6. Hypokalemia- replete PRN 7. PAD- amputation being recommended- pt refusing right now    Kilie Rund A    Labs: Basic Metabolic Panel: Recent Labs  Lab 11/24/17 1247 11/25/17 0346 11/26/17 0718  NA 135  141 146*  K 3.8 3.1* 3.5  CL 92* 102 106  CO2 21* 20* 25  GLUCOSE 170* 148* 199*  BUN 174* 124* 57*  CREATININE 5.17* 2.62* 1.15*  CALCIUM 8.8* 8.3* 9.0  PHOS  --   --  1.9*   Liver Function Tests: Recent Labs  Lab 11/24/17 1247 11/25/17 0346 11/26/17 0718  AST 50* 57*  --   ALT 22 26  --   ALKPHOS 92 79  --   BILITOT 0.8 0.7  --   PROT 8.1 7.6  --   ALBUMIN 3.0* 2.8* 2.8*   No results for input(s): LIPASE, AMYLASE in the last 168 hours. No results for input(s): AMMONIA in the last 168 hours. CBC: Recent Labs  Lab 11/24/17 1247 11/25/17 0346 11/26/17 0718  WBC 10.8* 10.2 8.5  NEUTROABS  --   --  7.1  HGB 11.4* 10.6* 11.3*  HCT 34.2* 32.8* 34.7*  MCV 89.5 90.6 91.8  PLT 355 323 332   Cardiac Enzymes: Recent Labs  Lab 11/24/17 1744  CKTOTAL 3,946*   CBG: Recent Labs  Lab 11/25/17 1617 11/25/17 2035 11/26/17 0103 11/26/17 0447 11/26/17 0759  GLUCAP 193* 185* 150* 156* 185*    Iron Studies:  Recent Labs    11/24/17 1744  IRON 28  TIBC 265  FERRITIN 85   Studies/Results: Dg Chest 2 View  Result Date: 11/24/2017 CLINICAL DATA:  Altered mental status, abnormal kidney functions EXAM: CHEST  2 VIEW COMPARISON:  Portable chest x-ray of 07/30/2017 FINDINGS: Although the previously described airspace disease has significantly improved there is patchy opacity remaining in the periphery of the right mid lung. Continued followup is recommended. No definite pleural effusion is seen. Mediastinal and hilar contours are unremarkable and mild cardiomegaly is stable. No bony abnormality is noted. IMPRESSION: Minimal airspace disease remains in the periphery of the right mid lung possibly representing pneumonia. Recommend continued followup. Electronically Signed   By: Dwyane DeePaul  Barry M.D.   On: 11/24/2017 14:26   [place] Renal  Result Date: 11/24/2017 CLINICAL DATA:  Acute renal insufficiency. EXAM: RENAL / URINARY TRACT ULTRASOUND COMPLETE COMPARISON:  MRI 08/01/2017.  CT  08/02/2017. FINDINGS: Right Kidney: Length: 11.2 cm. Echogenicity within normal limits. No mass or hydronephrosis visualized. 2.3 cm calcified right renal focus,, possibly slightly larger than on prior CT of 07/23/2017. Possibility of renal artery aneurysm cannot be excluded. Further evaluation with CT or MRI can be obtained. If the patient's renal function allows CTA can be performed. Left Kidney: Length: 11.1 cm. Echogenicity within normal limits. No mass or hydronephrosis. Bladder: Bladder nondistended.  Patient previously voided. IMPRESSION: 1. 2.3 cm calcified right renal focus again noted, possibly slightly larger than on prior CT of 08/02/2017. Possibly of renal artery aneurysm cannot be excluded. Other etiologies of calcified masses cannot be excluded. Further evaluation with CT or MRI can be obtained. The patient renal function line CTA can be performed. 2. No acute abnormality identified. No hydronephrosis. Bladder is nondistended. Electronically Signed   By: Maisie Fushomas  Register   On: 11/24/2017 16:17   Dg Foot Complete Left  Result Date: 11/24/2017 CLINICAL DATA:  71 year old diabetic female with open wound left foot. Initial encounter. EXAM: LEFT FOOT - COMPLETE 3+ VIEW COMPARISON:  None. FINDINGS: Ulceration inferior to plantar spur.  No adjacent osteomyelitis. Since the prior examination, change in configuration of posterior aspect of calcaneus superior to the Achilles tendon insertion site. This does not have typical configuration of osteomyelitis. No fracture or dislocation. IMPRESSION: Ulceration inferior to plantar spur.  No adjacent osteomyelitis. Since the prior examination, change in configuration of posterior aspect of calcaneus superior to the Achilles tendon insertion site. This does not have typical configuration of osteomyelitis. If further delineation were clinically desired, MR imaging may prove more sensitive. Electronically Signed   By: Lacy DuverneySteven  Olson M.D.   On: 11/24/2017 14:25    Medications: Infusions: . sodium chloride 75 mL/hr at 11/26/17 0035  . ceFEPime (MAXIPIME) IV Stopped (11/25/17 1615)    Scheduled Medications: . amphetamine-dextroamphetamine  30 mg Oral Q breakfast  . Chlorhexidine Gluconate Cloth  6 each Topical Q0600  . clopidogrel  75 mg Oral Daily  . DULoxetine  60 mg Oral Daily  . famotidine  20 mg Oral Daily  . heparin  5,000 Units Subcutaneous Q8H  . insulin aspart  0-15 Units Subcutaneous Q4H  . mupirocin ointment  1 application Nasal BID  . sodium chloride flush  3 mL Intravenous Q12H    have reviewed scheduled and prn medications.  Physical Exam: General: more alert, some tangential conversation Heart: tachy Lungs: mostly vclear Abdomen: soft, non tender Extremities: no peripheral edema yet, ulcer on heel- looks necrotic    11/26/2017,11:09 AM  LOS: 2 days

## 2017-11-26 NOTE — Progress Notes (Signed)
PROGRESS NOTE    Laurie [name]  ZOX:[id number] DOB: 02-20-1946 DOA: 11/24/2017 PCP: Rometta Emery, MD  Brief Narrative:71 y.o.femalewith medical history significant forchronic diastolic heart failure on ACE inhibitor, Zaroxolyn and Demadex, peripheral vascular disease with prior right BKA, chronic left heel ulcer, anemia of chronic kidney disease, diabetes on insulin, dyslipidemia, hypertension and narcolepsy. She has had altered mentation since last Friday and was taken to her PCP office. Subsequently the facility has been notified of abnormal renal function labs. EMS was called to the nurse facility because of worsened mentation. Patient's blood pressure was suboptimal and she was given 300 cc normal saline by the paramedics. Upon presentation patient's initial blood pressure was 63/20 with a pulse of 111, she had a rectal temperature of 99.2. Chest x-ray revealed possible right midlung zone pneumonia. Patient had mild leukocytosis. She has a chronic left heel ulcer which bleeds easily and partially covered by necrotic base and does have somewhat of a foul odor. Left foot x-ray without fracture, dislocation or evidence of osteomyelitis. Patient's lactic acid was mildly elevated at 2.3 in the context of severe acute kidney injury with a BUN of 174 and a creatinine of 5.17.With elevated lactic acid, hypotension, acute kidney injury and possible findings of infection in the lung sepsis protocol was initiated by the EDP and she has been given a dose of broad-spectrum empiric antibiotics after blood cultures were obtained. Patient has not made any urine despite fluid resuscitation. Bladder scan showed no urine in the bladder. Urinalysis ordered but has not yet been collected. I have informed the nurse that I will place an order for Foley catheter insertion for accurate intake and output.      Assessment & Plan:   Principal Problem:   Sepsis due to pneumonia vs foot  ulcer Active Problems:   Gangrene of left foot (HCC)   Acute kidney injury (HCC)   Diabetes mellitus, insulin dependent (IDDM), controlled (HCC)   Hypertension   Chronic diastolic heart failure, NYHA class 1 (HCC)   Type 1 diabetes mellitus with left diabetic foot ulcer (HCC)   High cholesterol   Anemia, chronic disease   PAD (peripheral artery disease) s/p right BKA   Sacral decubitus ulcer, stage II   Acute metabolic encephalopathy  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 5]hypernatremia change ivf to half ns.  PLAN- on VANCO AND CEFEPIME.appreciate dr DUDAS input.dw daughter who lives in [place].sw consult placed.family and patient thinking about surgery.   DVT prophylaxis: Heparin Code Status full code : Family Communication discussed with daughter TBD :Disposition Plan Consultants: Hospital, vascular surgery  Procedures: None Antimicrobials: Vanco and cefepime  Subjective: Patient appears very depressed because she heard that she needs to have amputation.   Objective: Resting in bed in no acute distress Vitals:   11/26/17 0412 11/26/17 0427 11/26/17 0811 11/26/17 1211  BP: (!) 137/93  (!) 160/79 (!) 161/73  Pulse: (!) 102  (!) 105 (!) 110  Resp: 14  20 15   Temp:   99 F (37.2 C) 98.2 F (36.8 C)  TempSrc:   Axillary Axillary  SpO2: 99%  99% 97%  Weight:  88.4 kg (194 lb 14.2 oz)    Height:        Intake/Output Summary (Last 24 hours) at 11/26/2017 1254 Last data filed at 11/26/2017 0600 Gross per 24 hour  Intake 653 ml  Output 4226 ml  Net -3573 ml  Filed Weights   11/24/17 2021 11/25/17 0352 11/26/17 0427  Weight: 91.3 kg (201 lb 4.5 oz) 88.8 kg (195 lb 12.3 oz) 88.4 kg (194 lb 14.2 oz)    Examination:  General exam: Appears calm and comfortable  Respiratory system: Clear to auscultation. Respiratory effort normal. Cardiovascular system: S1 & S2 heard, RRR. No  JVD, murmurs, rubs, gallops or clicks. No pedal edema. Gastrointestinal system: Abdomen is nondistended, soft and nontender. No organomegaly or masses felt. Normal bowel sounds heard. Central nervous system: Alert and oriented. No focal neurological deficits. Extremities: left ankle dressings on. Skin: No rashes, lesions or ulcers    Data Reviewed: I have personally reviewed following labs and imaging studies  CBC: Recent Labs  Lab 11/24/17 1247 11/25/17 0346 11/26/17 0718  WBC 10.8* 10.2 8.5  NEUTROABS  --   --  7.1  HGB 11.4* 10.6* 11.3*  HCT 34.2* 32.8* 34.7*  MCV 89.5 90.6 91.8  PLT 355 323 332   Basic Metabolic Panel: Recent Labs  Lab 11/24/17 1247 11/25/17 0346 11/26/17 0718  NA 135 141 146*  K 3.8 3.1* 3.5  CL 92* 102 106  CO2 21* 20* 25  GLUCOSE 170* 148* 199*  BUN 174* 124* 57*  CREATININE 5.17* 2.62* 1.15*  CALCIUM 8.8* 8.3* 9.0  MG  --   --  1.6*  PHOS  --   --  1.9*   GFR: Estimated Creatinine Clearance: 52.7 mL/min (A) (by C-G formula based on SCr of 1.15 mg/dL (H)). Liver Function Tests: Recent Labs  Lab 11/24/17 1247 11/25/17 0346 11/26/17 0718  AST 50* 57*  --   ALT 22 26  --   ALKPHOS 92 79  --   BILITOT 0.8 0.7  --   PROT 8.1 7.6  --   ALBUMIN 3.0* 2.8* 2.8*   No results for input(s): LIPASE, AMYLASE in the last 168 hours. No results for input(s): AMMONIA in the last 168 hours. Coagulation Profile: Recent Labs  Lab 11/24/17 1744  INR 1.21   Cardiac Enzymes: Recent Labs  Lab 11/24/17 1744  CKTOTAL 3,946*   BNP (last 3 results) No results for input(s): PROBNP in the last 8760 hours. HbA1C: Recent Labs    11/24/17 1744  HGBA1C 9.5*   CBG: Recent Labs  Lab 11/25/17 2035 11/26/17 0103 11/26/17 0447 11/26/17 0759 11/26/17 1207  GLUCAP 185* 150* 156* 185* 160*   Lipid Profile: No results for input(s): CHOL, HDL, LDLCALC, TRIG, CHOLHDL, LDLDIRECT in the last 72 hours. Thyroid Function Tests: Recent Labs     11/24/17 1744  TSH 0.091*   Anemia Panel: Recent Labs    11/24/17 1744  VITAMINB12 1,031*  FOLATE 56.7  FERRITIN 85  TIBC 265  IRON 28  RETICCTPCT 0.7   Sepsis Labs: Recent Labs  Lab 11/24/17 1314 11/24/17 1744 11/24/17 2049  PROCALCITON  --  0.46  --   LATICACIDVEN 2.30* 1.0 1.1    Recent Results (from the past 240 hour(s))  Blood culture (routine x 2)     Status: None (Preliminary result)   Collection Time: 11/24/17  1:33 PM  Result Value Ref Range Status   Specimen Description BLOOD LEFT ANTECUBITAL  Final   Special Requests   Final    BOTTLES DRAWN AEROBIC ONLY Blood Culture adequate volume   Culture NO GROWTH 2 DAYS  Final   Report Status PENDING  Incomplete  Blood culture (routine x 2)     Status: None (Preliminary result)   Collection Time: 11/24/17  1:33 PM  Result Value Ref Range Status   Specimen Description BLOOD LEFT HAND  Final   Special Requests   Final    BOTTLES DRAWN AEROBIC ONLY Blood Culture adequate volume   Culture NO GROWTH 2 DAYS  Final   Report Status PENDING  Incomplete  Urine Culture     Status: Abnormal   Collection Time: 11/24/17  5:58 PM  Result Value Ref Range Status   Specimen Description URINE, RANDOM  Final   Special Requests NONE  Final   Culture >=100,000 COLONIES/mL ESCHERICHIA COLI (A)  Final   Report Status 11/26/2017 FINAL  Final   Organism ID, Bacteria ESCHERICHIA COLI (A)  Final      Susceptibility   Escherichia coli - MIC*    AMPICILLIN <=2 SENSITIVE Sensitive     CEFAZOLIN <=4 SENSITIVE Sensitive     CEFTRIAXONE <=1 SENSITIVE Sensitive     CIPROFLOXACIN <=0.25 SENSITIVE Sensitive     GENTAMICIN <=1 SENSITIVE Sensitive     IMIPENEM <=0.25 SENSITIVE Sensitive     NITROFURANTOIN <=16 SENSITIVE Sensitive     TRIMETH/SULFA <=20 SENSITIVE Sensitive     AMPICILLIN/SULBACTAM <=2 SENSITIVE Sensitive     PIP/TAZO <=4 SENSITIVE Sensitive     Extended ESBL NEGATIVE Sensitive     * >=100,000 COLONIES/mL ESCHERICHIA COLI   Respiratory Panel by PCR     Status: None   Collection Time: 11/25/17  5:55 PM  Result Value Ref Range Status   Adenovirus NOT DETECTED NOT DETECTED Final   Coronavirus 229E NOT DETECTED NOT DETECTED Final   Coronavirus HKU1 NOT DETECTED NOT DETECTED Final   Coronavirus NL63 NOT DETECTED NOT DETECTED Final   Coronavirus OC43 NOT DETECTED NOT DETECTED Final   Metapneumovirus NOT DETECTED NOT DETECTED Final   Rhinovirus / Enterovirus NOT DETECTED NOT DETECTED Final   Influenza A NOT DETECTED NOT DETECTED Final   Influenza B NOT DETECTED NOT DETECTED Final   Parainfluenza Virus 1 NOT DETECTED NOT DETECTED Final   Parainfluenza Virus 2 NOT DETECTED NOT DETECTED Final   Parainfluenza Virus 3 NOT DETECTED NOT DETECTED Final   Parainfluenza Virus 4 NOT DETECTED NOT DETECTED Final   Respiratory Syncytial Virus NOT DETECTED NOT DETECTED Final   Bordetella pertussis NOT DETECTED NOT DETECTED Final   Chlamydophila pneumoniae NOT DETECTED NOT DETECTED Final   Mycoplasma pneumoniae NOT DETECTED NOT DETECTED Final  MRSA PCR Screening     Status: Abnormal   Collection Time: 11/25/17  8:45 PM  Result Value Ref Range Status   MRSA by PCR POSITIVE (A) NEGATIVE Final    Comment:        The GeneXpert MRSA Assay (FDA approved for NASAL specimens only), is one component of a comprehensive MRSA colonization surveillance program. It is not intended to diagnose MRSA infection nor to guide or monitor treatment for MRSA infections. RESULT CALLED TO, READ BACK BY AND VERIFIED WITH: Rhae LernerJ MIZE RN 0330 11/26/17 A BROWNING          Radiology Studies: Dg Chest 2 View  Result Date: 11/24/2017 CLINICAL DATA:  Altered mental status, abnormal kidney functions EXAM: CHEST  2 VIEW COMPARISON:  Portable chest x-ray of 07/30/2017 FINDINGS: Although the previously described airspace disease has significantly improved there is patchy opacity remaining in the periphery of the right mid lung. Continued followup is  recommended. No definite pleural effusion is seen. Mediastinal and hilar contours are unremarkable and mild cardiomegaly is stable. No bony abnormality is noted. IMPRESSION: Minimal airspace disease remains  in the periphery of the right mid lung possibly representing pneumonia. Recommend continued followup. Electronically Signed   By: Dwyane Dee M.D.   On: 11/24/2017 14:26   US Renal  Result Date: 11/24/2017 CLINICAL DATA:  Acute renal insufficiency. EXAM: RENAL / URINARY TRACT ULTRASOUND COMPLETE COMPARISON:  MRI 08/01/2017.  CT 08/02/2017. FINDINGS: Right Kidney: Length: 11.2 cm. Echogenicity within normal limits. No mass or hydronephrosis visualized. 2.3 cm calcified right renal focus,, possibly slightly larger than on prior CT of 07/23/2017. Possibility of renal artery aneurysm cannot be excluded. Further evaluation with CT or MRI can be obtained. If the patient's renal function allows CTA can be performed. Left Kidney: Length: 11.1 cm. Echogenicity within normal limits. No mass or hydronephrosis. Bladder: Bladder nondistended.  Patient previously voided. IMPRESSION: 1. 2.3 cm calcified right renal focus again noted, possibly slightly larger than on prior CT of 08/02/2017. Possibly of renal artery aneurysm cannot be excluded. Other etiologies of calcified masses cannot be excluded. Further evaluation with CT or MRI can be obtained. The patient renal function line CTA can be performed. 2. No acute abnormality identified. No hydronephrosis. Bladder is nondistended. Electronically Signed   By: Maisie Fus  Register   On: 11/24/2017 16:17   Dg Foot Complete Left  Result Date: 11/24/2017 CLINICAL DATA:  71 year old diabetic female with open wound left foot. Initial encounter. EXAM: LEFT FOOT - COMPLETE 3+ VIEW COMPARISON:  None. FINDINGS: Ulceration inferior to plantar spur.  No adjacent osteomyelitis. Since the prior examination, change in configuration of posterior aspect of calcaneus superior to the Achilles  tendon insertion site. This does not have typical configuration of osteomyelitis. No fracture or dislocation. IMPRESSION: Ulceration inferior to plantar spur.  No adjacent osteomyelitis. Since the prior examination, change in configuration of posterior aspect of calcaneus superior to the Achilles tendon insertion site. This does not have typical configuration of osteomyelitis. If further delineation were clinically desired, MR imaging may prove more sensitive. Electronically Signed   By: Lacy Duverney M.D.   On: 11/24/2017 14:25        Scheduled Meds: . amphetamine-dextroamphetamine  30 mg Oral Q breakfast  . Chlorhexidine Gluconate Cloth  6 each Topical Q0600  . clopidogrel  75 mg Oral Daily  . DULoxetine  60 mg Oral Daily  . famotidine  20 mg Oral Daily  . heparin  5,000 Units Subcutaneous Q8H  . insulin aspart  0-15 Units Subcutaneous Q4H  . mupirocin ointment  1 application Nasal BID  . sodium chloride flush  3 mL Intravenous Q12H   Continuous Infusions: . sodium chloride 75 mL/hr at 11/26/17 0035  . ceFEPime (MAXIPIME) IV Stopped (11/25/17 1615)  . vancomycin       LOS: 2 days     Alwyn Ren, MD [facility]  If 7PM-7AM, please contact night-coverage [email or web address] Password [email or web address] 11/26/2017, 12:54 PM

## 2017-11-26 NOTE — Progress Notes (Signed)
Pharmacy Antibiotic Note  Laurie [name] is a 71 y.o. female admitted on 11/24/2017 with AKI and AMS.     Day # 3 Vancomycin and Cefepime for sepsis coverage due to pneumonia vs foot ulcer.   Vancomycin 2gm IV x 1 and Cefepime 2gm IV x 1 given on 12/4.    Creatinine down 5.17>2.62>1.15.  Holding diuretics and ACEi/other BP meds.     Good UOP.  Tmax 99, WBC down 8.5, LA 2.3>1.1,  ESR >120.   E coli in urine culture 12/4 - pansensitive   MRSA PCR positive.  CHG/Mupirocin x 5 days begun.  Plan:  Continue Cefepime 1gm IV q24hrs.  Continue Vancomycin with 1500 mg IV q24hrs - begin today  Follow renal function, culture data, progress, antibiotic plans    Height: 5' 7"  (170.2 cm) Weight: 194 lb 14.2 oz (88.4 kg) IBW/kg (Calculated) : 61.6  Temp (24hrs), Avg:98.6 F (37 C), Min:98.3 F (36.8 C), Max:99 F (37.2 C)  Recent Labs  Lab 11/24/17 1247 11/24/17 1314 11/24/17 1744 11/24/17 2049 11/25/17 0346 11/26/17 0718  WBC 10.8*  --   --   --  10.2 8.5  CREATININE 5.17*  --   --   --  2.62* 1.15*  LATICACIDVEN  --  2.30* 1.0 1.1  --   --     Estimated Creatinine Clearance: 52.7 mL/min (A) (by C-G formula based on SCr of 1.15 mg/dL (H)).    Allergies  Allergen Reactions  . Penicillins Hives    Has patient had a PCN reaction causing immediate rash, facial/tongue/throat swelling, SOB or lightheadedness with hypotension: Yes Has patient had a PCN reaction causing severe rash involving mucus membranes or skin necrosis: No Has patient had a PCN reaction that required hospitalization: No Has patient had a PCN reaction occurring within the last 10 years: No If all of the above answers are "NO", then may proceed with Cephalosporin use.   Childhood allergy could have been more reaction only rem   - has tolerated Cephalosporins in the past  Antimicrobials this admission:   Vancomycin 12/4>>   Cefepime 12/4>>  Dose adjustments this admission:   12/5:  Creatinine improved, Cefepime  changed from 500 mg to 1 gm IV q24hrs   12/6: creatinine improved, Vancomycin maintenance dose to begin today  Microbiology results:  12/4 urine - E coli - pansensitive  12/4 blood x 2 - no growth x 2 days to date  12/4 respiratory panel - negative  12/5 MRSA PCR - positive  Thank you for allowing pharmacy to be a part of this patient's care.  Arty Baumgartner, [place] Pager: [phone] 11/26/2017 11:35 AM

## 2017-11-26 NOTE — Consult Note (Signed)
ORTHOPAEDIC CONSULTATION  REQUESTING PHYSICIAN: Alwyn RenMathews, Elizabeth G, MD  Chief Complaint: Chronic left heel ulcer without complaints.  HPI: Laurie [name] is a 71 y.o. female who presents with a chronic gangrenous left heel ulcer.  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  Patient cannot use a prosthesis she has contractures of both lower extremities.  Past Medical History:  Diagnosis Date  . Anemia   . Anxiety   . Bacteremia   . Bursitis    "left hip; sometimes left shoulder" (03/05/2015)  . Chest pain    Troponins up to 0.12 while hospitalized for E.coli PNA  . Depression   . Family history of adverse reaction to anesthesia    "brother died 06/24/2013; woke up w/headache, vomited blood; drilled holes in his head; bleeding stroke; never came back" (03/05/2015)  . Headache    "when someone gets on my nerves" (03/05/2015)  . High cholesterol   . Hypertension   . Narcolepsy   . 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 Stroke [facility](HCC) 10/2014   "weaker on left side since" (03/05/2015)  . Type II diabetes mellitus (HCC)   . Walking pneumonia 04/2009   "double" /notes 04/23/2011   Past Surgical History:  Procedure Laterality Date  . ABDOMINAL AORTAGRAM N/A 03/05/2015   Procedure: ABDOMINAL Ronny FlurryAORTAGRAM;  Surgeon: Chuck Hinthristopher S Dickson, MD;  [facility];  Service: Cardiovascular;  Laterality: N/A;  . AMPUTATION Right 11/03/2014   Procedure: AMPUTATION BELOW KNEE;  Surgeon: Sherren Kernsharles E Fields, MD;  [facility];  Service: Vascular;  Laterality: Right;  . FEMORAL-TIBIAL BYPASS GRAFT Left 03/06/2015   Procedure: BYPASS GRAFT Left FEMORAL-POSTERIOR TIBIAL ARTERY with reversed saphenous vein;  Surgeon: Sherren Kernsharles E Fields, MD;  [facility];  Service: Vascular;  Laterality: Left;  . INTRAOPERATIVE ARTERIOGRAM Left 03/06/2015   Procedure: INTRA OPERATIVE ARTERIOGRAM;  Surgeon: Sherren Kernsharles E Fields, MD;  [facility];  Service: Vascular;  Laterality: Left;  .  IR FLUORO GUIDE CV MIDLINE PICC RIGHT  07/29/2017  . IR US GUIDE VASC ACCESS RIGHT  07/29/2017  . IRRIGATION AND DEBRIDEMENT FOOT Left 12/05/2016   Procedure: IRRIGATION AND DEBRIDEMENT FOOT;  Surgeon: Gwyneth RevelsJustin Fowler, DPM;  Location: [facility] ORS;  Service: Podiatry;  Laterality: Left;  . LOWER EXTREMITY ANGIOGRAM N/A 10/03/2014   Procedure: LOWER EXTREMITY ANGIOGRAM;  Surgeon: Pamella PertJagadeesh R Ganji, MD;  [facility];  Service: Cardiovascular;  Laterality: N/A;  . PERIPHERAL VASCULAR CATHETERIZATION Left 12/03/2016   Procedure: Lower Extremity Angiography;  Surgeon: Annice NeedyJason S Dew, MD;  Location: [facility] INVASIVE CV LAB;  Service: Cardiovascular;  Laterality: Left;   Social History   Socioeconomic History  . Marital status: Divorced    Spouse name: None  . Number of children: None  . Years of education: None  . Highest education level: None  Social Needs  . Financial resource strain: None  . Food insecurity - worry: None  . Food insecurity - inability: None  . Transportation needs - medical: None  . Transportation needs - non-medical: None  Occupational History  . None  Tobacco Use  . Smoking status: Former Smoker    Packs/day: 0.10    Years: 40.00    Pack years: 4.00    Types: Cigarettes    Last attempt to quit: 11/21/2016    Years since quitting: 1.0  . Smokeless tobacco: Never Used  Substance and Sexual Activity  . Alcohol use: Yes    Alcohol/week:  0.0 oz    Comment: 03/05/2015 "Might have a drink on holidays"  . Drug use: No  . Sexual activity: Not Currently  Other Topics Concern  . None  Social History Narrative   She lives with her sister and they do not have a good relationship.   Family History  Problem Relation Age of Onset  . CAD Mother   . CAD Father    - negative except otherwise stated in the family history section Allergies  Allergen Reactions  . Penicillins Hives    Has patient had a PCN reaction causing immediate rash, facial/tongue/throat swelling, SOB or  lightheadedness with hypotension: Yes Has patient had a PCN reaction causing severe rash involving mucus membranes or skin necrosis: No Has patient had a PCN reaction that required hospitalization: No Has patient had a PCN reaction occurring within the last 10 years: No If all of the above answers are "NO", then may proceed with Cephalosporin use.   Childhood allergy could have been more reaction only rem   Prior to Admission medications   Medication Sig Start Date End Date Taking? Authorizing Provider  amLODipine (NORVASC) 5 MG tablet Take 1 tablet (5 mg total) by mouth daily. 08/03/17  Yes Elgergawy, Leana Roeawood S, MD  amphetamine-dextroamphetamine (ADDERALL) 30 MG tablet Take 30 mg by mouth daily with breakfast. Reported on 07/02/2016   Yes [provider]  ascorbic acid (VITAMIN C) 500 MG tablet Take 500 mg by mouth 3 (three) times daily.    Yes [provider]  atorvastatin (LIPITOR) 10 MG tablet Take 1 tablet (10 mg total) by mouth daily at 6 PM. Patient taking differently: Take 20 mg by mouth daily at 6 PM.  11/04/13  Yes Love, Evlyn KannerPamela S, PA-C  cloNIDine (CATAPRES) 0.2 MG tablet Take 0.2 mg by mouth 3 (three) times daily.   Yes [provider]  clopidogrel (PLAVIX) 75 MG tablet Take 75 mg by mouth daily.   Yes [provider]  DULoxetine (CYMBALTA) 60 MG capsule Take 60 mg by mouth daily.   Yes [provider]  gabapentin (NEURONTIN) 300 MG capsule Take 1 capsule (300 mg total) by mouth 2 (two) times daily. Patient taking differently: Take 300 mg by mouth 3 (three) times daily.  03/13/14  Yes Kirsteins, Victorino SparrowAndrew E, MD  HYDROcodone-acetaminophen (NORCO/VICODIN) 5-325 MG tablet Take 1 tablet by mouth every 6 (six) hours as needed for moderate pain. 08/02/17  Yes Elgergawy, Leana Roeawood S, MD  insulin aspart (NOVOLOG) 100 UNIT/ML injection Inject 0-12 Units into the skin 4 (four) times daily. Sliding scale-  <200= 0 units 201-250= 2 units 251-300= 4  units 301-350= 6 units 351-400=8 units 401-450= 10 units 451-500= 12 units >501= call MD   Yes [provider]  Insulin Glargine (TOUJEO SOLOSTAR [place]) Inject 90 Units into the skin at bedtime.    Yes [provider]  ipratropium-albuterol (DUONEB) 0.5-2.5 (3) MG/3ML SOLN Take 3 mLs by nebulization every 6 (six) hours as needed. 08/02/17  Yes Elgergawy, Leana Roeawood S, MD  lamoTRIgine (LAMICTAL) 100 MG tablet Take 100 mg by mouth 2 (two) times daily.    Yes [provider]  lisinopril (PRINIVIL,ZESTRIL) 20 MG tablet Take 1 tablet (20 mg total) by mouth daily. 11/07/14  Yes Jeralyn BennettZamora, Ezequiel, MD  loratadine (CLARITIN) 10 MG tablet Take 10 mg by mouth at bedtime.   Yes [provider]  metolazone (ZAROXOLYN) 10 MG tablet Take 10 mg by mouth every Monday, Wednesday, and Friday.   Yes [provider]  Multiple Vitamins-Minerals (CERTAGEN PO) Take 1 tablet by mouth daily.    Yes [provider]  phenylephrine-shark liver oil-mineral oil-petrolatum (PREPARATION H) 0.25-3-14-71.9 % rectal ointment Place 1 application rectally 3 (three) times daily as needed for hemorrhoids.   Yes [provider]  Polyethyl Glycol-Propyl Glycol (SYSTANE) 0.4-0.3 % SOLN Place 1 drop into both eyes 2 (two) times daily.    Yes [provider]  ranitidine (ZANTAC) 150 MG tablet Take 150 mg by mouth at bedtime.   Yes [provider]  senna-docusate (SENOKOT-S) 8.6-50 MG tablet Take 1 tablet by mouth at bedtime as needed for mild constipation. 12/08/16  Yes Auburn Bilberry, MD  torsemide (DEMADEX) 100 MG tablet Take 100 mg by mouth daily.   Yes [provider]  diclofenac sodium (VOLTAREN) 1 % GEL Apply 4 g topically 4 (four) times daily. Patient not taking: Reported on 11/24/2017 06/20/16   Palumbo, April, MD  insulin detemir (LEVEMIR) 100 UNIT/ML injection Inject 0.5 mLs (50 Units total) into the skin at bedtime. Patient not taking: Reported on  11/24/2017 08/02/17   Elgergawy, Leana Roe, MD   Dg Chest 2 View  Result Date: 11/24/2017 CLINICAL DATA:  Altered mental status, abnormal kidney functions EXAM: CHEST  2 VIEW COMPARISON:  Portable chest x-ray of 07/30/2017 FINDINGS: Although the previously described airspace disease has significantly improved there is patchy opacity remaining in the periphery of the right mid lung. Continued followup is recommended. No definite pleural effusion is seen. Mediastinal and hilar contours are unremarkable and mild cardiomegaly is stable. No bony abnormality is noted. IMPRESSION: Minimal airspace disease remains in the periphery of the right mid lung possibly representing pneumonia. Recommend continued followup. Electronically Signed   By: Dwyane Dee M.D.   On: 11/24/2017 14:26   US Renal  Result Date: 11/24/2017 CLINICAL DATA:  Acute renal insufficiency. EXAM: RENAL / URINARY TRACT ULTRASOUND COMPLETE COMPARISON:  MRI 08/01/2017.  CT 08/02/2017. FINDINGS: Right Kidney: Length: 11.2 cm. Echogenicity within normal limits. No mass or hydronephrosis visualized. 2.3 cm calcified right renal focus,, possibly slightly larger than on prior CT of 07/23/2017. Possibility of renal artery aneurysm cannot be excluded. Further evaluation with CT or MRI can be obtained. If the patient's renal function allows CTA can be performed. Left Kidney: Length: 11.1 cm. Echogenicity within normal limits. No mass or hydronephrosis. Bladder: Bladder nondistended.  Patient previously voided. IMPRESSION: 1. 2.3 cm calcified right renal focus again noted, possibly slightly larger than on prior CT of 08/02/2017. Possibly of renal artery aneurysm cannot be excluded. Other etiologies of calcified masses cannot be excluded. Further evaluation with CT or MRI can be obtained. The patient renal function line CTA can be performed. 2. No acute abnormality identified. No hydronephrosis. Bladder is nondistended. Electronically Signed   By: Maisie Fus  Register    On: 11/24/2017 16:17   Dg Foot Complete Left  Result Date: 11/24/2017 CLINICAL DATA:  71 year old diabetic female with open wound left foot. Initial encounter. EXAM: LEFT FOOT - COMPLETE 3+ VIEW COMPARISON:  None. FINDINGS: Ulceration inferior to plantar spur.  No adjacent osteomyelitis. Since the prior examination, change in configuration of posterior aspect of calcaneus superior to the Achilles tendon insertion site. This does not have typical configuration of osteomyelitis. No fracture or dislocation. IMPRESSION: Ulceration inferior to plantar spur.  No adjacent osteomyelitis. Since the prior examination, change in configuration of posterior aspect of calcaneus superior to the Achilles tendon insertion site. This does not have typical configuration of osteomyelitis. If  further delineation were clinically desired, MR imaging may prove more sensitive. Electronically Signed   By: Lacy Duverney M.D.   On: 11/24/2017 14:25   - pertinent xrays, CT, MRI studies were reviewed and independently interpreted  Positive ROS: All other systems have been reviewed and were otherwise negative with the exception of those mentioned in the HPI and as above.  Physical Exam: General: Alert, no acute distress Psychiatric: Patient is confused and agitated. Lymphatic: No axillary or cervical lymphadenopathy Cardiovascular: No pedal edema Respiratory: No cyanosis, no use of accessory musculature GI: No organomegaly, abdomen is soft and non-tender  Skin: Examination patient has a well-healed right transtibial amputation.  There are no ulcers in the right leg.  Examination of the left heel she has a right gangrenous ulcer of the entire left heel pad.   Neurologic: Patient does not have protective sensation left lower extremities.   MUSCULOSKELETAL:  Examination patient does not have a palpable dorsalis pedis or posterior tibial pulse on the left.  She is status post revascularization procedures and is not a  revascularization candidate at this time.  Patient's skin in the left lower extremity is thin and atrophic patient has involuntary spasms in the left leg with a  flexion contracture of the left knee  Assessment: Assessment: Diabetic insensate neuropathy with peripheral vascular disease with a right gangrenous ulcer of the left heel.    Plan: Plan: I agree with vascular surgery patient's only option is an amputation for the left lower extremity.  With her flexion contracture the ideal treatment would be an above-knee amputation.  Patient would not benefit from a below the knee amputation she is unable to ambulate and the flexion contracture would be a source of further ulceration.  I will follow-up as needed if patient wishes to proceed with an above knee amputation.  Thank you for the consult and the opportunity to see Ms. Laren Boom, MD [facility] [phone] 7:48 AM

## 2017-11-27 ENCOUNTER — Inpatient Hospital Stay (HOSPITAL_COMMUNITY): Payer: Medicare Other

## 2017-11-27 ENCOUNTER — Telehealth (INDEPENDENT_AMBULATORY_CARE_PROVIDER_SITE_OTHER): Payer: Self-pay | Admitting: Orthopedic Surgery

## 2017-11-27 DIAGNOSIS — J189 Pneumonia, unspecified organism: Secondary | ICD-10-CM

## 2017-11-27 DIAGNOSIS — A419 Sepsis, unspecified organism: Principal | ICD-10-CM

## 2017-11-27 LAB — BASIC METABOLIC PANEL
Anion gap: 11 (ref 5–15)
BUN: 28 mg/dL — AB (ref 6–20)
CHLORIDE: 105 mmol/L (ref 101–111)
CO2: 26 mmol/L (ref 22–32)
Calcium: 8.8 mg/dL — ABNORMAL LOW (ref 8.9–10.3)
Creatinine, Ser: 0.98 mg/dL (ref 0.44–1.00)
GFR calc Af Amer: >60 mL/min (ref >60)
GFR calc non Af Amer: 58 mL/min — ABNORMAL LOW (ref >60)
Glucose, Bld: 180 mg/dL — ABNORMAL HIGH (ref 65–99)
POTASSIUM: 3 mmol/L — AB (ref 3.5–5.1)
SODIUM: 142 mmol/L (ref 135–145)

## 2017-11-27 LAB — GLUCOSE, CAPILLARY
GLUCOSE-CAPILLARY: 120 mg/dL — AB (ref 65–99)
GLUCOSE-CAPILLARY: 159 mg/dL — AB (ref 65–99)
GLUCOSE-CAPILLARY: 164 mg/dL — AB (ref 65–99)
GLUCOSE-CAPILLARY: 169 mg/dL — AB (ref 65–99)
GLUCOSE-CAPILLARY: 180 mg/dL — AB (ref 65–99)
Glucose-Capillary: 120 mg/dL — ABNORMAL HIGH (ref 65–99)

## 2017-11-27 MED ORDER — POTASSIUM CHLORIDE CRYS ER 20 MEQ PO TBCR
40.0000 meq | EXTENDED_RELEASE_TABLET | Freq: Two times a day (BID) | ORAL | Status: AC
  Administered 2017-11-27 (×2): 40 meq via ORAL
  Filled 2017-11-27 (×2): qty 2

## 2017-11-27 NOTE — H&P (View-Only) (Signed)
Patient ID: Laurie [name], female   DOB: 11/05/1946, 71 y.o.   MRN: [id number] I spoke with the patient's family today.  Patient and her family wish to proceed with a above-the-knee amputation.  They would like to proceed as soon as possible.  We will plan for a left above-the-knee amputation Saturday, tomorrow morning.  N.p.o. after midnight. 

## 2017-11-27 NOTE — Progress Notes (Signed)
Patient ID: Laurie [name], female   DOB: 10/20/1946, 71 y.o.   MRN: [id number] I spoke with the patient's family today.  Patient and her family wish to proceed with a above-the-knee amputation.  They would like to proceed as soon as possible.  We will plan for a left above-the-knee amputation Saturday, tomorrow morning.  N.p.o. after midnight.

## 2017-11-27 NOTE — Telephone Encounter (Signed)
Asked Dr. Lajoyce Cornersuda if he would call and speak with patients daughter, he has already spoken with family earlier today.

## 2017-11-27 NOTE — Telephone Encounter (Signed)
Pt daughter called and would like to speak with Dr.Duda. Pt daughter stated that she previously spoke with Dr.Duda and just need some more clarity pertaining to the pt surgery.

## 2017-11-27 NOTE — Progress Notes (Signed)
PROGRESS NOTE    Laurie [name]  ZOX:[id number] DOB: 1946/06/19 DOA: 11/24/2017 PCP: Rometta Emery, MD  Brief Narrative:71 y.o.femalewith medical history significant forchronic diastolic heart failure on ACE inhibitor, Zaroxolyn and Demadex, peripheral vascular disease with prior right BKA, chronic left heel ulcer, anemia of chronic kidney disease, diabetes on insulin, dyslipidemia, hypertension and narcolepsy. She has had altered mentation since last Friday and was taken to her PCP office. Subsequently the facility has been notified of abnormal renal function labs. EMS was called to the nurse facility because of worsened mentation. Patient's blood pressure was suboptimal and she was given 300 cc normal saline by the paramedics. Upon presentation patient's initial blood pressure was 63/20 with a pulse of 111, she had a rectal temperature of 99.2. Chest x-ray revealed possible right midlung zone pneumonia. Patient had mild leukocytosis. She has a chronic left heel ulcer which bleeds easily and partially covered by necrotic base and does have somewhat of a foul odor. Left foot x-ray without fracture, dislocation or evidence of osteomyelitis. Patient's lactic acid was mildly elevated at 2.3 in the context of severe acute kidney injury with a BUN of 174 and a creatinine of 5.17.With elevated lactic acid, hypotension, acute kidney injury and possible findings of infection in the lung sepsis protocol was initiated by the EDP and she has been given a dose of broad-spectrum empiric antibiotics after blood cultures were obtained. Patient has not made any urine despite fluid resuscitation. Bladder scan showed no urine in the bladder. Urinalysis ordered but has not yet been collected. I have informed the nurse that I will place an order for Foley catheter insertion for accurate intake and output    Assessment & Plan:   Principal Problem:   Sepsis due to pneumonia vs foot ulcer Active  Problems:   Gangrene of left foot (HCC)   Acute kidney injury (HCC)   Diabetes mellitus, insulin dependent (IDDM), controlled (HCC)   Hypertension   Chronic diastolic heart failure, NYHA class 1 (HCC)   Type 1 diabetes mellitus with left diabetic foot ulcer (HCC)   High cholesterol   Anemia, chronic disease   PAD (peripheral artery disease) s/p right BKA   Sacral decubitus ulcer, stage II   Acute metabolic encephalopathy  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 5]hypernatremia -dc ivf.she has been receiving ivf since admit.  PLAN- on VANCO AND CEFEPIME.appreciate dr DUDAS input.i called daughter today and left a voice mail regarding decision to do surgery or not?if they decide against surgery will plan dc back to [facility] soon.    DVT prophylaxisheparin : Code Status: full  Family Communication await call back from daughter Disposition Plan tbd  Consultants  Ortho,vasc  Procedures: none Antimicrobials:vanc/zosyn Subjective: No complaints  Objective:resting.does not want to talk. Vitals:   11/27/17 0804 11/27/17 0816 11/27/17 0925 11/27/17 1204  BP: (!) 115/31 (!) 112/45 138/86   Pulse: 83 88 92   Resp: 16 18    Temp:    98.2 F (36.8 C)  TempSrc:    Oral  SpO2: 98% 98%    Weight:      Height:        Intake/Output Summary (Last 24 hours) at 11/27/2017 1502 Last data filed at 11/27/2017 0922 Gross per 24 hour  Intake 1348.33 ml  Output 1900 ml  Net -551.67 ml   Filed Weights   11/25/17 0352 11/26/17 0427 11/27/17 0500  Weight: 88.8  kg (195 lb 12.3 oz) 88.4 kg (194 lb 14.2 oz) 88.6 kg (195 lb 5.2 oz)    Examination:  General exam: Appears calm and comfortable  Respiratory system: Clear to auscultation. Respiratory effort normal. Cardiovascular system: S1 & S2 heard, RRR. No JVD, murmurs, rubs, gallops or clicks. No pedal edema. Gastrointestinal system: Abdomen is  nondistended, soft and nontender. No organomegaly or masses felt. Normal bowel sounds heard. Central nervous system: Alert and oriented. No focal neurological deficits. Extremities:  R BKA LEFT FOOT WITH DRESSING ONSkin: No rashes, lesions or ulcers Psychiatry: Judgement and insight appear normal. Mood & affect appropriate.     Data Reviewed: I have personally reviewed following labs and imaging studies  CBC: Recent Labs  Lab 11/24/17 1247 11/25/17 0346 11/26/17 0718  WBC 10.8* 10.2 8.5  NEUTROABS  --   --  7.1  HGB 11.4* 10.6* 11.3*  HCT 34.2* 32.8* 34.7*  MCV 89.5 90.6 91.8  PLT 355 323 332   Basic Metabolic Panel: Recent Labs  Lab 11/24/17 1247 11/25/17 0346 11/26/17 0718 11/27/17 0153  NA 135 141 146* 142  K 3.8 3.1* 3.5 3.0*  CL 92* 102 106 105  CO2 21* 20* 25 26  GLUCOSE 170* 148* 199* 180*  BUN 174* 124* 57* 28*  CREATININE 5.17* 2.62* 1.15* 0.98  CALCIUM 8.8* 8.3* 9.0 8.8*  MG  --   --  1.6*  --   PHOS  --   --  1.9*  --    GFR: Estimated Creatinine Clearance: 61.9 mL/min (by C-G formula based on SCr of 0.98 mg/dL). Liver Function Tests: Recent Labs  Lab 11/24/17 1247 11/25/17 0346 11/26/17 0718  AST 50* 57*  --   ALT 22 26  --   ALKPHOS 92 79  --   BILITOT 0.8 0.7  --   PROT 8.1 7.6  --   ALBUMIN 3.0* 2.8* 2.8*   No results for input(s): LIPASE, AMYLASE in the last 168 hours. No results for input(s): AMMONIA in the last 168 hours. Coagulation Profile: Recent Labs  Lab 11/24/17 1744  INR 1.21   Cardiac Enzymes: Recent Labs  Lab 11/24/17 1744  CKTOTAL 3,946*   BNP (last 3 results) No results for input(s): PROBNP in the last 8760 hours. HbA1C: Recent Labs    11/24/17 1744  HGBA1C 9.5*   CBG: Recent Labs  Lab 11/26/17 2006 11/27/17 0019 11/27/17 0422 11/27/17 0756 11/27/17 1202  GLUCAP 158* 164* 169* 120* 120*   Lipid Profile: No results for input(s): CHOL, HDL, LDLCALC, TRIG, CHOLHDL, LDLDIRECT in the last 72  hours. Thyroid Function Tests: Recent Labs    11/24/17 1744  TSH 0.091*   Anemia Panel: Recent Labs    11/24/17 1744  VITAMINB12 1,031*  FOLATE 56.7  FERRITIN 85  TIBC 265  IRON 28  RETICCTPCT 0.7   Sepsis Labs: Recent Labs  Lab 11/24/17 1314 11/24/17 1744 11/24/17 2049  PROCALCITON  --  0.46  --   LATICACIDVEN 2.30* 1.0 1.1    Recent Results (from the past 240 hour(s))  Blood culture (routine x 2)     Status: None (Preliminary result)   Collection Time: 11/24/17  1:33 PM  Result Value Ref Range Status   Specimen Description BLOOD LEFT ANTECUBITAL  Final   Special Requests   Final    BOTTLES DRAWN AEROBIC ONLY Blood Culture adequate volume   Culture NO GROWTH 3 DAYS  Final   Report Status PENDING  Incomplete  Blood culture (routine x  2)     Status: None (Preliminary result)   Collection Time: 11/24/17  1:33 PM  Result Value Ref Range Status   Specimen Description BLOOD LEFT HAND  Final   Special Requests   Final    BOTTLES DRAWN AEROBIC ONLY Blood Culture adequate volume   Culture NO GROWTH 3 DAYS  Final   Report Status PENDING  Incomplete  Urine Culture     Status: Abnormal   Collection Time: 11/24/17  5:58 PM  Result Value Ref Range Status   Specimen Description URINE, RANDOM  Final   Special Requests NONE  Final   Culture >=100,000 COLONIES/mL ESCHERICHIA COLI (A)  Final   Report Status 11/26/2017 FINAL  Final   Organism ID, Bacteria ESCHERICHIA COLI (A)  Final      Susceptibility   Escherichia coli - MIC*    AMPICILLIN <=2 SENSITIVE Sensitive     CEFAZOLIN <=4 SENSITIVE Sensitive     CEFTRIAXONE <=1 SENSITIVE Sensitive     CIPROFLOXACIN <=0.25 SENSITIVE Sensitive     GENTAMICIN <=1 SENSITIVE Sensitive     IMIPENEM <=0.25 SENSITIVE Sensitive     NITROFURANTOIN <=16 SENSITIVE Sensitive     TRIMETH/SULFA <=20 SENSITIVE Sensitive     AMPICILLIN/SULBACTAM <=2 SENSITIVE Sensitive     PIP/TAZO <=4 SENSITIVE Sensitive     Extended ESBL NEGATIVE Sensitive      * >=100,000 COLONIES/mL ESCHERICHIA COLI  Respiratory Panel by PCR     Status: None   Collection Time: 11/25/17  5:55 PM  Result Value Ref Range Status   Adenovirus NOT DETECTED NOT DETECTED Final   Coronavirus 229E NOT DETECTED NOT DETECTED Final   Coronavirus HKU1 NOT DETECTED NOT DETECTED Final   Coronavirus NL63 NOT DETECTED NOT DETECTED Final   Coronavirus OC43 NOT DETECTED NOT DETECTED Final   Metapneumovirus NOT DETECTED NOT DETECTED Final   Rhinovirus / Enterovirus NOT DETECTED NOT DETECTED Final   Influenza A NOT DETECTED NOT DETECTED Final   Influenza B NOT DETECTED NOT DETECTED Final   Parainfluenza Virus 1 NOT DETECTED NOT DETECTED Final   Parainfluenza Virus 2 NOT DETECTED NOT DETECTED Final   Parainfluenza Virus 3 NOT DETECTED NOT DETECTED Final   Parainfluenza Virus 4 NOT DETECTED NOT DETECTED Final   Respiratory Syncytial Virus NOT DETECTED NOT DETECTED Final   Bordetella pertussis NOT DETECTED NOT DETECTED Final   Chlamydophila pneumoniae NOT DETECTED NOT DETECTED Final   Mycoplasma pneumoniae NOT DETECTED NOT DETECTED Final  MRSA PCR Screening     Status: Abnormal   Collection Time: 11/25/17  8:45 PM  Result Value Ref Range Status   MRSA by PCR POSITIVE (A) NEGATIVE Final    Comment:        The GeneXpert MRSA Assay (FDA approved for NASAL specimens only), is one component of a comprehensive MRSA colonization surveillance program. It is not intended to diagnose MRSA infection nor to guide or monitor treatment for MRSA infections. RESULT CALLED TO, READ BACK BY AND VERIFIED WITH: Rhae Lerner RN 0330 11/26/17 A BROWNING          Radiology Studies: No results found.      Scheduled Meds: . Chlorhexidine Gluconate Cloth  6 each Topical Q0600  . clopidogrel  75 mg Oral Daily  . DULoxetine  60 mg Oral Daily  . famotidine  20 mg Oral Daily  . heparin  5,000 Units Subcutaneous Q8H  . insulin aspart  0-15 Units Subcutaneous Q4H  . metoprolol tartrate  25  mg Oral  BID  . mupirocin ointment  1 application Nasal BID  . sodium chloride flush  3 mL Intravenous Q12H   Continuous Infusions: . sodium chloride 100 mL/hr at 11/27/17 0935  . ceFEPime (MAXIPIME) IV Stopped (11/26/17 1833)  . vancomycin Stopped (11/27/17 1501)     LOS: 3 days    Alwyn RenElizabeth G Eesha Schmaltz, MD [facility]   If 7PM-7AM, please contact night-coverage [email or web address] Password [email or web address] 11/27/2017, 3:02 PM

## 2017-11-27 NOTE — Clinical Social Work Note (Signed)
Clinical Social Work Assessment  Patient Details  Name: Laurie [name] MRN: [id number] Date of Birth: [birth year]  Date of referral:  11/27/17               Reason for consult:  Facility Placement                Permission sought to share information with:  Facility Medical sales representativeContact Representative, Family Supports Permission granted to share information::  Yes, Verbal Permission Granted  Name::     Charity fundraiserJessica  Agency::  SNFs  Relationship::  Daughter  Contact Information:  [phone]  Housing/Transportation Living arrangements for the past 2 months:  Skilled Nursing Facility Source of Information:  Adult Children Patient Interpreter Needed:  None Criminal Activity/Legal Involvement Pertinent to Current Situation/Hospitalization:  No - Comment as needed Significant Relationships:  Adult Children Lives with:  Facility Resident Do you feel safe going back to the place where you live?  No Need for family participation in patient care:  Yes (Comment)  Care giving concerns:  CSW received consult for possible SNF placement at time of discharge. CSW spoke with patient's daughter regarding discharge plan. Shanda BumpsJessica reported that patient will be having surgery, but will need rehab afterwards. [facility], but does not want to return there. Shanda BumpsJessica lives in [place] and would like to take her mother there as a long term goal. CSW advised Shanda BumpsJessica that she would need to make sure to convert the [place]. CSW to continue to follow and assist with discharge planning needs.   Social Worker assessment / plan:  CSW spoke with patient's daughter [facility]  Employment status:  Disabled (Comment on whether or not currently receiving Disability), Retired Health and safety inspectornsurance information:  Medicare, [place] Recommendations:  Not assessed at this time Information / Referral to community resources:  Skilled Nursing Facility  Patient/Family's  Response to care:  Patient's daughter expressed concern with patient's current SNF and would like a different one. She also asked about HCPOA paperwork. CSW advised her that hospital could do paperwork with [facility] if the patient is alert and oriented.   Patient/Family's Understanding of and Emotional Response to Diagnosis, Current Treatment, and Prognosis:  Patient/family is realistic regarding therapy needs and expressed being hopeful for a different SNF placement. Patient's daughter expressed understanding of CSW role and discharge process as well as medical condition. No questions/concerns about plan or treatment.    Emotional Assessment Appearance:  Appears stated age Attitude/Demeanor/Rapport:  Unable to Assess Affect (typically observed):  Unable to Assess Orientation:  Oriented to Self, Oriented to Place, Oriented to Situation Alcohol / Substance use:  Not Applicable Psych involvement (Current and /or in the community):  No (Comment)  Discharge Needs  Concerns to be addressed:  Care Coordination Readmission within the last 30 days:  No Current discharge risk:  None Barriers to Discharge:  Continued Medical Work up   [employer] S Chealsey Miyamoto, LCSWA 11/27/2017, 5:06 PM

## 2017-11-28 ENCOUNTER — Encounter (HOSPITAL_COMMUNITY): Payer: Self-pay | Admitting: Certified Registered Nurse Anesthetist

## 2017-11-28 ENCOUNTER — Inpatient Hospital Stay (HOSPITAL_COMMUNITY): Payer: Medicare Other | Admitting: Anesthesiology

## 2017-11-28 ENCOUNTER — Encounter (HOSPITAL_COMMUNITY)
Admission: EM | Disposition: A | Discharge status: Skilled Nursing Facility | Payer: Self-pay | Source: Home / Self Care | Attending: Internal Medicine

## 2017-11-28 HISTORY — PX: AMPUTATION: SHX166

## 2017-11-28 HISTORY — PX: APPLICATION OF WOUND VAC: SHX5189

## 2017-11-28 LAB — BASIC METABOLIC PANEL
ANION GAP: 12 (ref 5–15)
BUN: 18 mg/dL (ref 6–20)
CALCIUM: 8.6 mg/dL — AB (ref 8.9–10.3)
CHLORIDE: 106 mmol/L (ref 101–111)
CO2: 25 mmol/L (ref 22–32)
Creatinine, Ser: 0.88 mg/dL (ref 0.44–1.00)
GFR calc Af Amer: >60 mL/min (ref >60)
GFR calc non Af Amer: >60 mL/min (ref >60)
GLUCOSE: 119 mg/dL — AB (ref 65–99)
Potassium: 4 mmol/L (ref 3.5–5.1)
Sodium: 143 mmol/L (ref 135–145)

## 2017-11-28 LAB — GLUCOSE, CAPILLARY
GLUCOSE-CAPILLARY: 119 mg/dL — AB (ref 65–99)
GLUCOSE-CAPILLARY: 129 mg/dL — AB (ref 65–99)
GLUCOSE-CAPILLARY: 134 mg/dL — AB (ref 65–99)
GLUCOSE-CAPILLARY: 149 mg/dL — AB (ref 65–99)
GLUCOSE-CAPILLARY: 197 mg/dL — AB (ref 65–99)
Glucose-Capillary: 114 mg/dL — ABNORMAL HIGH (ref 65–99)
Glucose-Capillary: 153 mg/dL — ABNORMAL HIGH (ref 65–99)
Glucose-Capillary: 173 mg/dL — ABNORMAL HIGH (ref 65–99)

## 2017-11-28 LAB — CBC WITH DIFFERENTIAL/PLATELET
BASOS PCT: 0 %
Basophils Absolute: 0 10*3/uL (ref 0.0–0.1)
Eosinophils Absolute: 0.2 10*3/uL (ref 0.0–0.7)
Eosinophils Relative: 2 %
HEMATOCRIT: 34.9 % — AB (ref 36.0–46.0)
HEMOGLOBIN: 10.9 g/dL — AB (ref 12.0–15.0)
LYMPHS ABS: 2.4 10*3/uL (ref 0.7–4.0)
Lymphocytes Relative: 27 %
MCH: 28.9 pg (ref 26.0–34.0)
MCHC: 31.2 g/dL (ref 30.0–36.0)
MCV: 92.6 fL (ref 78.0–100.0)
MONO ABS: 0.5 10*3/uL (ref 0.1–1.0)
MONOS PCT: 6 %
NEUTROS ABS: 5.6 10*3/uL (ref 1.7–7.7)
Neutrophils Relative %: 65 %
Platelets: 349 10*3/uL (ref 150–400)
RBC: 3.77 MIL/uL — ABNORMAL LOW (ref 3.87–5.11)
RDW: 13.6 % (ref 11.5–15.5)
WBC: 8.7 10*3/uL (ref 4.0–10.5)

## 2017-11-28 LAB — SURGICAL PCR SCREEN
MRSA, PCR: POSITIVE — AB
Staphylococcus aureus: POSITIVE — AB

## 2017-11-28 LAB — TYPE AND SCREEN
ABO/RH(D): A POS
Antibody Screen: NEGATIVE

## 2017-11-28 SURGERY — AMPUTATION, ABOVE KNEE
Anesthesia: General | Site: Leg Lower | Laterality: Left

## 2017-11-28 MED ORDER — PROPOFOL 10 MG/ML IV BOLUS
INTRAVENOUS | Status: AC
  Filled 2017-11-28: qty 20

## 2017-11-28 MED ORDER — VANCOMYCIN HCL IN DEXTROSE 1-5 GM/200ML-% IV SOLN
1000.0000 mg | Freq: Two times a day (BID) | INTRAVENOUS | Status: DC
  Administered 2017-11-29 (×2): 1000 mg via INTRAVENOUS
  Filled 2017-11-28 (×2): qty 200

## 2017-11-28 MED ORDER — METOPROLOL TARTRATE 5 MG/5ML IV SOLN
10.0000 mg | Freq: Four times a day (QID) | INTRAVENOUS | Status: DC
  Administered 2017-11-28 – 2017-12-01 (×14): 10 mg via INTRAVENOUS
  Filled 2017-11-28 (×14): qty 10

## 2017-11-28 MED ORDER — DOCUSATE SODIUM 100 MG PO CAPS
100.0000 mg | ORAL_CAPSULE | Freq: Two times a day (BID) | ORAL | Status: DC
  Administered 2017-11-28 – 2017-12-01 (×6): 100 mg via ORAL
  Filled 2017-11-28 (×6): qty 1

## 2017-11-28 MED ORDER — DEXTROSE 5 % IV SOLN
1.0000 g | Freq: Two times a day (BID) | INTRAVENOUS | Status: DC
  Administered 2017-11-28 – 2017-11-29 (×2): 1 g via INTRAVENOUS
  Filled 2017-11-28 (×2): qty 1

## 2017-11-28 MED ORDER — ONDANSETRON HCL 4 MG/2ML IJ SOLN
INTRAMUSCULAR | Status: DC | PRN
  Administered 2017-11-28: 4 mg via INTRAVENOUS

## 2017-11-28 MED ORDER — HYDROMORPHONE HCL 1 MG/ML IJ SOLN
INTRAMUSCULAR | Status: AC
  Administered 2017-11-28: 0.5 mg via INTRAVENOUS
  Filled 2017-11-28: qty 1

## 2017-11-28 MED ORDER — LACTATED RINGERS IV SOLN
INTRAVENOUS | Status: DC
  Administered 2017-11-28 (×2): via INTRAVENOUS

## 2017-11-28 MED ORDER — ONDANSETRON HCL 4 MG PO TABS: 4.0000 mg | ORAL_TABLET | Freq: Four times a day (QID) | ORAL | Status: DC | PRN

## 2017-11-28 MED ORDER — SODIUM CHLORIDE 0.9 % IV SOLN
INTRAVENOUS | Status: DC
  Administered 2017-11-28: 17:00:00 via INTRAVENOUS

## 2017-11-28 MED ORDER — PROMETHAZINE HCL 25 MG/ML IJ SOLN
6.2500 mg | INTRAMUSCULAR | Status: DC | PRN
  Administered 2017-11-28: 12.5 mg via INTRAVENOUS

## 2017-11-28 MED ORDER — METOCLOPRAMIDE HCL 10 MG PO TABS: 5.0000 mg | ORAL_TABLET | Freq: Three times a day (TID) | ORAL | Status: DC | PRN

## 2017-11-28 MED ORDER — POLYETHYLENE GLYCOL 3350 17 G PO PACK: 17.0000 g | PACK | Freq: Every day | ORAL | Status: DC | PRN

## 2017-11-28 MED ORDER — MIDAZOLAM HCL 2 MG/2ML IJ SOLN
INTRAMUSCULAR | Status: DC | PRN
  Administered 2017-11-28: 2 mg via INTRAVENOUS

## 2017-11-28 MED ORDER — FENTANYL CITRATE (PF) 250 MCG/5ML IJ SOLN
INTRAMUSCULAR | Status: AC
Start: 2017-11-28 — End: ?
  Filled 2017-11-28: qty 5

## 2017-11-28 MED ORDER — HYDROMORPHONE HCL 1 MG/ML IJ SOLN
0.2500 mg | INTRAMUSCULAR | Status: DC | PRN
  Administered 2017-11-28 (×4): 0.5 mg via INTRAVENOUS

## 2017-11-28 MED ORDER — LIDOCAINE HCL (CARDIAC) 20 MG/ML IV SOLN
INTRAVENOUS | Status: DC | PRN
  Administered 2017-11-28: 60 mg via INTRAVENOUS

## 2017-11-28 MED ORDER — OXYCODONE HCL 5 MG PO TABS
5.0000 mg | ORAL_TABLET | ORAL | Status: DC | PRN
  Administered 2017-11-28 – 2017-11-30 (×3): 5 mg via ORAL
  Filled 2017-11-28 (×3): qty 1

## 2017-11-28 MED ORDER — ACETAMINOPHEN 650 MG RE SUPP: 650.0000 mg | RECTAL | Status: DC | PRN

## 2017-11-28 MED ORDER — 0.9 % SODIUM CHLORIDE (POUR BTL) OPTIME
TOPICAL | Status: DC | PRN
  Administered 2017-11-28: 1000 mL

## 2017-11-28 MED ORDER — HYDROMORPHONE HCL 1 MG/ML IJ SOLN
1.0000 mg | INTRAMUSCULAR | Status: DC | PRN
  Administered 2017-11-28 – 2017-12-01 (×7): 1 mg via INTRAVENOUS
  Filled 2017-11-28 (×8): qty 1

## 2017-11-28 MED ORDER — DEXAMETHASONE SODIUM PHOSPHATE 10 MG/ML IJ SOLN
INTRAMUSCULAR | Status: DC | PRN
  Administered 2017-11-28: 4 mg via INTRAVENOUS

## 2017-11-28 MED ORDER — ONDANSETRON HCL 4 MG/2ML IJ SOLN
INTRAMUSCULAR | Status: AC
Start: 2017-11-28 — End: ?
  Filled 2017-11-28: qty 2

## 2017-11-28 MED ORDER — ONDANSETRON HCL 4 MG/2ML IJ SOLN
4.0000 mg | Freq: Four times a day (QID) | INTRAMUSCULAR | Status: DC | PRN
  Administered 2017-11-28: 4 mg via INTRAVENOUS
  Filled 2017-11-28: qty 2

## 2017-11-28 MED ORDER — PHENYLEPHRINE HCL 10 MG/ML IJ SOLN
INTRAMUSCULAR | Status: DC | PRN
  Administered 2017-11-28 (×3): 120 ug via INTRAVENOUS

## 2017-11-28 MED ORDER — SODIUM CHLORIDE 0.9 % IV SOLN: INTRAVENOUS | Status: DC | PRN

## 2017-11-28 MED ORDER — BISACODYL 10 MG RE SUPP
10.0000 mg | Freq: Every day | RECTAL | Status: DC | PRN
  Filled 2017-11-28: qty 1

## 2017-11-28 MED ORDER — ACETAMINOPHEN 325 MG PO TABS
650.0000 mg | ORAL_TABLET | ORAL | Status: DC | PRN
  Administered 2017-12-01: 650 mg via ORAL
  Filled 2017-11-28: qty 2

## 2017-11-28 MED ORDER — CHLORHEXIDINE GLUCONATE 4 % EX LIQD
60.0000 mL | Freq: Once | CUTANEOUS | Status: AC
  Administered 2017-11-28: 4 via TOPICAL
  Filled 2017-11-28 (×2): qty 60

## 2017-11-28 MED ORDER — FENTANYL CITRATE (PF) 100 MCG/2ML IJ SOLN
INTRAMUSCULAR | Status: DC | PRN
Start: 2017-11-28 — End: 2017-11-28
  Administered 2017-11-28 (×7): 50 ug via INTRAVENOUS
  Administered 2017-11-28: 100 ug via INTRAVENOUS
  Administered 2017-11-28: 50 ug via INTRAVENOUS

## 2017-11-28 MED ORDER — PROPOFOL 10 MG/ML IV BOLUS
INTRAVENOUS | Status: DC | PRN
  Administered 2017-11-28: 120 mg via INTRAVENOUS

## 2017-11-28 MED ORDER — MIDAZOLAM HCL 2 MG/2ML IJ SOLN
INTRAMUSCULAR | Status: AC
Start: 2017-11-28 — End: ?
  Filled 2017-11-28: qty 2

## 2017-11-28 MED ORDER — MAGNESIUM CITRATE PO SOLN: 1.0000 | Freq: Once | ORAL | Status: DC | PRN

## 2017-11-28 MED ORDER — PHENYLEPHRINE 40 MCG/ML (10ML) SYRINGE FOR IV PUSH (FOR BLOOD PRESSURE SUPPORT)
PREFILLED_SYRINGE | INTRAVENOUS | Status: AC
  Filled 2017-11-28: qty 10

## 2017-11-28 MED ORDER — OXYCODONE HCL 5 MG PO TABS
10.0000 mg | ORAL_TABLET | ORAL | Status: DC | PRN
  Administered 2017-11-29 – 2017-12-01 (×6): 10 mg via ORAL
  Filled 2017-11-28 (×6): qty 2

## 2017-11-28 MED ORDER — METOCLOPRAMIDE HCL 5 MG/ML IJ SOLN: 5.0000 mg | Freq: Three times a day (TID) | INTRAMUSCULAR | Status: DC | PRN

## 2017-11-28 MED ORDER — DIPHENHYDRAMINE HCL 25 MG PO CAPS
25.0000 mg | ORAL_CAPSULE | Freq: Once | ORAL | Status: AC
Start: 2017-11-28 — End: 2017-11-28
  Administered 2017-11-28: 25 mg via ORAL
  Filled 2017-11-28: qty 1

## 2017-11-28 MED ORDER — PROMETHAZINE HCL 25 MG/ML IJ SOLN
INTRAMUSCULAR | Status: AC
  Administered 2017-11-28: 12.5 mg via INTRAVENOUS
  Filled 2017-11-28: qty 1

## 2017-11-28 SURGICAL SUPPLY — 45 items
BLADE SAW RECIP 87.9 MT (BLADE) ×3 IMPLANT
BNDG COHESIVE 6X5 TAN STRL LF (GAUZE/BANDAGES/DRESSINGS) ×6 IMPLANT
BNDG GAUZE ELAST 4 BULKY (GAUZE/BANDAGES/DRESSINGS) IMPLANT
CANISTER WOUND CARE 500ML ATS (WOUND CARE) IMPLANT
COVER SURGICAL LIGHT HANDLE (MISCELLANEOUS) ×6 IMPLANT
CUFF TOURNIQUET SINGLE 34IN LL (TOURNIQUET CUFF) IMPLANT
CUFF TOURNIQUET SINGLE 44IN (TOURNIQUET CUFF) IMPLANT
DRAIN PENROSE 1/2X12 LTX STRL (WOUND CARE) IMPLANT
DRAPE INCISE IOBAN 66X45 STRL (DRAPES) ×6 IMPLANT
DRAPE U-SHAPE 47X51 STRL (DRAPES) ×3 IMPLANT
DRESSING PREVENA PLUS CUSTOM (GAUZE/BANDAGES/DRESSINGS) IMPLANT
DRSG ADAPTIC 3X8 NADH LF (GAUZE/BANDAGES/DRESSINGS) IMPLANT
DRSG PAD ABDOMINAL 8X10 ST (GAUZE/BANDAGES/DRESSINGS) IMPLANT
DRSG PREVENA PLUS CUSTOM (GAUZE/BANDAGES/DRESSINGS) ×3
DURAPREP 26ML APPLICATOR (WOUND CARE) ×3 IMPLANT
ELECT CAUTERY BLADE 6.4 (BLADE) IMPLANT
ELECT REM PT RETURN 9FT ADLT (ELECTROSURGICAL) ×3
ELECTRODE REM PT RTRN 9FT ADLT (ELECTROSURGICAL) ×1 IMPLANT
EVACUATOR 1/8 PVC DRAIN (DRAIN) IMPLANT
GAUZE SPONGE 4X4 12PLY STRL (GAUZE/BANDAGES/DRESSINGS) ×3 IMPLANT
GLOVE BIOGEL PI IND STRL 9 (GLOVE) ×1 IMPLANT
GLOVE BIOGEL PI INDICATOR 9 (GLOVE) ×2
GLOVE SURG ORTHO 9.0 STRL STRW (GLOVE) ×3 IMPLANT
GOWN STRL REUS W/ TWL XL LVL3 (GOWN DISPOSABLE) ×2 IMPLANT
GOWN STRL REUS W/TWL XL LVL3 (GOWN DISPOSABLE) ×6
KIT BASIN OR (CUSTOM PROCEDURE TRAY) ×3 IMPLANT
KIT ROOM TURNOVER OR (KITS) ×3 IMPLANT
MANIFOLD NEPTUNE II (INSTRUMENTS) ×3 IMPLANT
NS IRRIG 1000ML POUR BTL (IV SOLUTION) ×3 IMPLANT
PACK ORTHO EXTREMITY (CUSTOM PROCEDURE TRAY) ×3 IMPLANT
PAD ARMBOARD 7.5X6 YLW CONV (MISCELLANEOUS) ×3 IMPLANT
STAPLER VISISTAT 35W (STAPLE) IMPLANT
STOCKINETTE IMPERVIOUS LG (DRAPES) IMPLANT
SUT ETHILON 2 0 PSLX (SUTURE) ×8 IMPLANT
SUT PDS AB 1 CT  36 (SUTURE)
SUT PDS AB 1 CT 36 (SUTURE) IMPLANT
SUT SILK 2 0 (SUTURE) ×3
SUT SILK 2-0 18XBRD TIE 12 (SUTURE) ×1 IMPLANT
SUT VIC AB 1 CTX 27 (SUTURE) ×3 IMPLANT
TOWEL OR 17X24 6PK STRL BLUE (TOWEL DISPOSABLE) ×3 IMPLANT
TOWEL OR 17X26 10 PK STRL BLUE (TOWEL DISPOSABLE) ×3 IMPLANT
TUBE CONNECTING 20'X1/4 (TUBING) ×1
TUBE CONNECTING 20X1/4 (TUBING) ×2 IMPLANT
WATER STERILE IRR 1000ML POUR (IV SOLUTION) ×3 IMPLANT
YANKAUER SUCT BULB TIP NO VENT (SUCTIONS) ×3 IMPLANT

## 2017-11-28 NOTE — Progress Notes (Signed)
CHG AND HIBICLENS BATH DONE AS ORDERED

## 2017-11-28 NOTE — Progress Notes (Signed)
PROGRESS NOTE    Laurie [name]  ZOX:[id number] DOB: May 09, 1946 DOA: 11/24/2017 PCP: Rometta Emery, MD   Brief Narrative  71 y.o.femalewith medical history significant forchronic diastolic heart failure on ACE inhibitor, Zaroxolyn and Demadex, peripheral vascular disease with prior right BKA, chronic left heel ulcer, anemia of chronic kidney disease, diabetes on insulin, dyslipidemia, hypertension and narcolepsy. She has had altered mentation since last Friday and was taken to her PCP office. Subsequently the facility has been notified of abnormal renal function labs. EMS was called to the nurse facility because of worsened mentation. Patient's blood pressure was suboptimal and she was given 300 cc normal saline by the paramedics. Upon presentation patient's initial blood pressure was 63/20 with a pulse of 111, she had a rectal temperature of 99.2. Chest x-ray revealed possible right midlung zone pneumonia. Patient had mild leukocytosis. She has a chronic left heel ulcer which bleeds easily and partially covered by necrotic base and does have somewhat of a foul odor. Left foot x-ray without fracture, dislocation or evidence of osteomyelitis. Patient's lactic acid was mildly elevated at 2.3 in the context of severe acute kidney injury with a BUN of 174 and a creatinine of 5.17.With elevated lactic acid, hypotension, acute kidney injury and possible findings of infection in the lung sepsis protocol was initiated by the EDP and she has been given a dose of broad-spectrum empiric antibiotics after blood cultures were obtained. Patient has not made any urine despite fluid resuscitation. Bladder scan showed no urine in the bladder. Urinalysis ordered but has not yet been collected. I have informed the nurse that I will place an order for Foley catheter insertion for accurate intake and output  I saw the patient prior to going for surgery.  Patient is at OR now for left above-knee  amputation.   11/28/2017.  Discussed with her daughter and patient last night and they had agreed for the surgery.  Appreciate Dr. Audrie Lia help.    Assessment & Plan:   Principal Problem:   Sepsis due to pneumonia vs foot ulcer Active Problems:   Gangrene of left foot (HCC)   Acute kidney injury (HCC)   Diabetes mellitus, insulin dependent (IDDM), controlled (HCC)   Hypertension   Chronic diastolic heart failure, NYHA class 1 (HCC)   Type 1 diabetes mellitus with left diabetic foot ulcer (HCC)   High cholesterol   Anemia, chronic disease   PAD (peripheral artery disease) s/p right BKA   Sacral decubitus ulcer, stage II   Acute metabolic encephalopathy Severe PAD/gangrene-4 left above-knee amputation today. Acute on chronic kidney disease stable and resolved Hypokalemia repleted potassium 4.0. Type 2 diabetes blood sugar stable on glargine insulin. Right middle lobe pneumonia improving with SVN and antibiotics.  She is currently on Vanco and cefepime.  Plan patient came from a nursing home.  Hopefully she can go back to the skilled part of the nursing home for rehab PT and OT after left above-knee amputation.  Continue current antibiotics for now.  Plan for discharge most probably on Monday at the latest Tuesday if she remains stable.  She has not been restarted on any of her diuretics due to acute renal failure at the time of presentation.  Will monitor closely and restart her diuretics at a lower dose and slowly as needed. DVT prophylaxis: Heparin Code Status: Full code Family Communication: Discussed with daughter Disposition Plan: Plan to discharge her back to the nursing home after surgery. Consultants: Ortho, vascular  Procedures: None Antimicrobials: Vanco  and Zosyn  Subjective: Awake no complaints today   Objective: Resting in bed in no acute distress Vitals:   11/27/17 1605 11/27/17 2000 11/27/17 2146 11/28/17 0526  BP: (!) 132/47  131/81   Pulse: 87 92    Resp: 15  15    Temp: 97.7 F (36.5 C)  98.7 F (37.1 C)   TempSrc: Oral     SpO2: 98% 97%    Weight:    88.9 kg (195 lb 15.8 oz)  Height:        Intake/Output Summary (Last 24 hours) at 11/28/2017 1014 Last data filed at 11/28/2017 1000 Gross per 24 hour  Intake 460 ml  Output 2325 ml  Net -1865 ml   Filed Weights   11/26/17 0427 11/27/17 0500 11/28/17 0526  Weight: 88.4 kg (194 lb 14.2 oz) 88.6 kg (195 lb 5.2 oz) 88.9 kg (195 lb 15.8 oz)    Examination:  General exam: Appears calm and comfortable  Respiratory system: Clear to auscultation. Respiratory effort normal. Cardiovascular system: S1 & S2 heard, RRR. No JVD, murmurs, rubs, gallops or clicks. No pedal edema. Gastrointestinal system: Abdomen is nondistended, soft and nontender. No organomegaly or masses felt. Normal bowel sounds heard. Central nervous system: Alert and oriented. No focal neurological deficits. Extremities r bka. Skin: No rashes, lesions or ulcers Psychiatry: Judgement and insight appear normal. Mood & affect appropriate.     Data Reviewed: I have personally reviewed following labs and imaging studies  CBC: Recent Labs  Lab 11/24/17 1247 11/25/17 0346 11/26/17 0718 11/28/17 0312  WBC 10.8* 10.2 8.5 8.7  NEUTROABS  --   --  7.1 5.6  HGB 11.4* 10.6* 11.3* 10.9*  HCT 34.2* 32.8* 34.7* 34.9*  MCV 89.5 90.6 91.8 92.6  PLT 355 323 332 349   Basic Metabolic Panel: Recent Labs  Lab 11/24/17 1247 11/25/17 0346 11/26/17 0718 11/27/17 0153 11/28/17 0312  NA 135 141 146* 142 143  K 3.8 3.1* 3.5 3.0* 4.0  CL 92* 102 106 105 106  CO2 21* 20* 25 26 25   GLUCOSE 170* 148* 199* 180* 119*  BUN 174* 124* 57* 28* 18  CREATININE 5.17* 2.62* 1.15* 0.98 0.88  CALCIUM 8.8* 8.3* 9.0 8.8* 8.6*  MG  --   --  1.6*  --   --   PHOS  --   --  1.9*  --   --    GFR: Estimated Creatinine Clearance: 69.1 mL/min (by C-G formula based on SCr of 0.88 mg/dL). Liver Function Tests: Recent Labs  Lab 11/24/17 1247  11/25/17 0346 11/26/17 0718  AST 50* 57*  --   ALT 22 26  --   ALKPHOS 92 79  --   BILITOT 0.8 0.7  --   PROT 8.1 7.6  --   ALBUMIN 3.0* 2.8* 2.8*   No results for input(s): LIPASE, AMYLASE in the last 168 hours. No results for input(s): AMMONIA in the last 168 hours. Coagulation Profile: Recent Labs  Lab 11/24/17 1744  INR 1.21   Cardiac Enzymes: Recent Labs  Lab 11/24/17 1744  CKTOTAL 3,946*   BNP (last 3 results) No results for input(s): PROBNP in the last 8760 hours. HbA1C: No results for input(s): HGBA1C in the last 72 hours. CBG: Recent Labs  Lab 11/27/17 1611 11/27/17 2023 11/28/17 0041 11/28/17 0515 11/28/17 0822  GLUCAP 159* 180* 129* 114* 119*   Lipid Profile: No results for input(s): CHOL, HDL, LDLCALC, TRIG, CHOLHDL, LDLDIRECT in the last 72 hours. Thyroid Function  Tests: No results for input(s): TSH, T4TOTAL, FREET4, T3FREE, THYROIDAB in the last 72 hours. Anemia Panel: No results for input(s): VITAMINB12, FOLATE, FERRITIN, TIBC, IRON, RETICCTPCT in the last 72 hours. Sepsis Labs: Recent Labs  Lab 11/24/17 1314 11/24/17 1744 11/24/17 2049  PROCALCITON  --  0.46  --   LATICACIDVEN 2.30* 1.0 1.1    Recent Results (from the past 240 hour(s))  Blood culture (routine x 2)     Status: None (Preliminary result)   Collection Time: 11/24/17  1:33 PM  Result Value Ref Range Status   Specimen Description BLOOD LEFT ANTECUBITAL  Final   Special Requests   Final    BOTTLES DRAWN AEROBIC ONLY Blood Culture adequate volume   Culture NO GROWTH 3 DAYS  Final   Report Status PENDING  Incomplete  Blood culture (routine x 2)     Status: None (Preliminary result)   Collection Time: 11/24/17  1:33 PM  Result Value Ref Range Status   Specimen Description BLOOD LEFT HAND  Final   Special Requests   Final    BOTTLES DRAWN AEROBIC ONLY Blood Culture adequate volume   Culture NO GROWTH 3 DAYS  Final   Report Status PENDING  Incomplete  Urine Culture      Status: Abnormal   Collection Time: 11/24/17  5:58 PM  Result Value Ref Range Status   Specimen Description URINE, RANDOM  Final   Special Requests NONE  Final   Culture >=100,000 COLONIES/mL ESCHERICHIA COLI (A)  Final   Report Status 11/26/2017 FINAL  Final   Organism ID, Bacteria ESCHERICHIA COLI (A)  Final      Susceptibility   Escherichia coli - MIC*    AMPICILLIN <=2 SENSITIVE Sensitive     CEFAZOLIN <=4 SENSITIVE Sensitive     CEFTRIAXONE <=1 SENSITIVE Sensitive     CIPROFLOXACIN <=0.25 SENSITIVE Sensitive     GENTAMICIN <=1 SENSITIVE Sensitive     IMIPENEM <=0.25 SENSITIVE Sensitive     NITROFURANTOIN <=16 SENSITIVE Sensitive     TRIMETH/SULFA <=20 SENSITIVE Sensitive     AMPICILLIN/SULBACTAM <=2 SENSITIVE Sensitive     PIP/TAZO <=4 SENSITIVE Sensitive     Extended ESBL NEGATIVE Sensitive     * >=100,000 COLONIES/mL ESCHERICHIA COLI  Respiratory Panel by PCR     Status: None   Collection Time: 11/25/17  5:55 PM  Result Value Ref Range Status   Adenovirus NOT DETECTED NOT DETECTED Final   Coronavirus 229E NOT DETECTED NOT DETECTED Final   Coronavirus HKU1 NOT DETECTED NOT DETECTED Final   Coronavirus NL63 NOT DETECTED NOT DETECTED Final   Coronavirus OC43 NOT DETECTED NOT DETECTED Final   Metapneumovirus NOT DETECTED NOT DETECTED Final   Rhinovirus / Enterovirus NOT DETECTED NOT DETECTED Final   Influenza A NOT DETECTED NOT DETECTED Final   Influenza B NOT DETECTED NOT DETECTED Final   Parainfluenza Virus 1 NOT DETECTED NOT DETECTED Final   Parainfluenza Virus 2 NOT DETECTED NOT DETECTED Final   Parainfluenza Virus 3 NOT DETECTED NOT DETECTED Final   Parainfluenza Virus 4 NOT DETECTED NOT DETECTED Final   Respiratory Syncytial Virus NOT DETECTED NOT DETECTED Final   Bordetella pertussis NOT DETECTED NOT DETECTED Final   Chlamydophila pneumoniae NOT DETECTED NOT DETECTED Final   Mycoplasma pneumoniae NOT DETECTED NOT DETECTED Final  MRSA PCR Screening     Status:  Abnormal   Collection Time: 11/25/17  8:45 PM  Result Value Ref Range Status   MRSA by PCR POSITIVE (A) NEGATIVE  Final    Comment:        The GeneXpert MRSA Assay (FDA approved for NASAL specimens only), is one component of a comprehensive MRSA colonization surveillance program. It is not intended to diagnose MRSA infection nor to guide or monitor treatment for MRSA infections. RESULT CALLED TO, READ BACK BY AND VERIFIED WITH: J MIZE RN 0330 11/26/17 A BROWNING   Surgical pcr screen     Status: Abnormal   Collection Time: 11/28/17  1:02 AM  Result Value Ref Range Status   MRSA, PCR POSITIVE (A) NEGATIVE Final    Comment: RESULT CALLED TO, READ BACK BY AND VERIFIED WITH: L EI RN 0530 11/28/17 A BROWNING    Staphylococcus aureus POSITIVE (A) NEGATIVE Final    Comment: (NOTE) The Xpert SA Assay (FDA approved for NASAL specimens in patients 69 years of age and older), is one component of a comprehensive surveillance program. It is not intended to diagnose infection nor to guide or monitor treatment.          Radiology Studies: Dg Chest 1 View  Result Date: 11/27/2017 CLINICAL DATA:  Cough and chest pain EXAM: CHEST 1 VIEW COMPARISON:  November 24, 2017 FINDINGS: There is persistent patchy opacity in the right base. Lungs elsewhere are clear. Heart is borderline enlarged with pulmonary vascularity within normal limits. No adenopathy. No bone lesions. IMPRESSION: Persistent patchy opacity right mid lung, likely a focus of pneumonia. No new airspace opacity. Stable cardiac prominence. No adenopathy evident. Electronically Signed   By: Bretta BangWilliam  Woodruff III M.D.   On: 11/27/2017 18:59        Scheduled Meds: . [MAR Hold] Chlorhexidine Gluconate Cloth  6 each Topical Q0600  . [MAR Hold] clopidogrel  75 mg Oral Daily  . [MAR Hold] DULoxetine  60 mg Oral Daily  . [MAR Hold] famotidine  20 mg Oral Daily  . [MAR Hold] heparin  5,000 Units Subcutaneous Q8H  . [MAR Hold] insulin  aspart  0-15 Units Subcutaneous Q4H  . [MAR Hold] metoprolol tartrate  25 mg Oral BID  . [MAR Hold] mupirocin ointment  1 application Nasal BID  . [MAR Hold] sodium chloride flush  3 mL Intravenous Q12H   Continuous Infusions: . [MAR Hold] ceFEPime (MAXIPIME) IV Stopped (11/27/17 1731)  . lactated ringers    . [MAR Hold] vancomycin Stopped (11/27/17 1501)     LOS: 4 days      Alwyn RenElizabeth G Mathews, MD [facility]   If 7PM-7AM, please contact night-coverage [email or web address] Password [email or web address] 11/28/2017, 10:14 AM

## 2017-11-28 NOTE — Progress Notes (Signed)
Patient sent to OR.

## 2017-11-28 NOTE — Progress Notes (Signed)
Pharmacy Antibiotic Note  Laurie [name] is a 71 y.o. female admitted on 11/24/2017 with AKI and AMS. Patient placed on Vanc and Cefepime for sepsis concern secondary to gangrenous left foot.   Patient underwent above the knee amputation today. Per ortho, antibiotics are to continue for 24 hours post-op, margins were clear. Scr has improved, CrCl ~65-70 ml/min. Will need to adjust current antibiotic doses with improved renal function.   Plan: Adjust Cefepime to 1gm IV q12hrs Adjust Vancomycin to 1000 mg IV q12hrs Continue to monitor renal function and confirm stop date as tomorrow   Height: 5\' 7"  (170.2 cm) Weight: 195 lb 15.8 oz (88.9 kg) IBW/kg (Calculated) : 61.6  Temp (24hrs), Avg:98 F (36.7 C), Min:97.3 F (36.3 C), Max:99 F (37.2 C)  Recent Labs  Lab 11/24/17 1247 11/24/17 1314 11/24/17 1744 11/24/17 2049 11/25/17 0346 11/26/17 0718 11/27/17 0153 11/28/17 0312  WBC 10.8*  --   --   --  10.2 8.5  --  8.7  CREATININE 5.17*  --   --   --  2.62* 1.15* 0.98 0.88  LATICACIDVEN  --  2.30* 1.0 1.1  --   --   --   --     Estimated Creatinine Clearance: 69.1 mL/min (by C-G formula based on SCr of 0.88 mg/dL).    Allergies  Allergen Reactions  . Penicillins Hives    Has patient had a PCN reaction causing immediate rash, facial/tongue/throat swelling, SOB or lightheadedness with hypotension: Yes Has patient had a PCN reaction causing severe rash involving mucus membranes or skin necrosis: No Has patient had a PCN reaction that required hospitalization: No Has patient had a PCN reaction occurring within the last 10 years: No If all of the above answers are "NO", then may proceed with Cephalosporin use.   Childhood allergy could have been more reaction only rem   - has tolerated Cephalosporins in the past  Antimicrobials this admission: Vancomycin 12/4>> Cefepime 12/4>>  Dose adjustments this admission: 12/5:  Cefepime 500 mg >> 1 gm IV Q24h 12/8: Cefepime 1 gm IV  Q24h>>Q12h 12/8: Vancomycin 1500 mg Q24h>>1,000 mg Q12h  Microbiology results:  12/4 urine - E coli - pansensitive  12/4 blood x 2 - no growth x 2 days to date  12/4 respiratory panel - negative  12/5 MRSA PCR - positive  Laurie PotterSabrina Shaquetta [name], PharmD Pharmacy Resident Pager: [phone]

## 2017-11-28 NOTE — Interval H&P Note (Signed)
History and Physical Interval Note:  11/28/2017 7:22 AM  Laurie BrownieAnne W Gonia  has presented today for surgery, with the diagnosis of gangrene left foot  The various methods of treatment have been discussed with the patient and family. After consideration of risks, benefits and other options for treatment, the patient has consented to  Procedure(s): AMPUTATION ABOVE KNEE (Left) as a surgical intervention .  The patient's history has been reviewed, patient examined, no change in status, stable for surgery.  I have reviewed the patient's chart and labs.  Questions were answered to the patient's satisfaction.     Nadara MustardMarcus V Earle Burson

## 2017-11-28 NOTE — Anesthesia Procedure Notes (Signed)
Procedure Name: LMA Insertion Date/Time: 11/28/2017 9:37 AM Performed by: Epifanio LeschesMercer, Jaylynn Siefert L, CRNA Pre-anesthesia Checklist: Patient identified, Emergency Drugs available, Suction available and Patient being monitored Patient Re-evaluated:Patient Re-evaluated prior to induction Oxygen Delivery Method: Circle System Utilized Preoxygenation: Pre-oxygenation with 100% oxygen Induction Type: IV induction Ventilation: Mask ventilation without difficulty LMA: LMA inserted LMA Size: 4.0 Number of attempts: 1 Airway Equipment and Method: Bite block Placement Confirmation: positive ETCO2 and breath sounds checked- equal and bilateral Tube secured with: Tape Dental Injury: Teeth and Oropharynx as per pre-operative assessment

## 2017-11-28 NOTE — Progress Notes (Signed)
Pt surgical PCR is positive for staph, on call physician BLOUNT notified through text paged

## 2017-11-28 NOTE — Anesthesia Postprocedure Evaluation (Signed)
Anesthesia Post Note  Patient: Laurie [name]  Procedure(s) Performed: AMPUTATION ABOVE KNEE (Left Leg Lower) APPLICATION OF WOUND VAC (Left Leg Lower)     Patient location during evaluation: PACU Anesthesia Type: General Level of consciousness: awake and alert Pain management: pain level controlled Vital Signs Assessment: post-procedure vital signs reviewed and stable Respiratory status: spontaneous breathing, nonlabored ventilation, respiratory function stable and patient connected to nasal cannula oxygen Cardiovascular status: blood pressure returned to baseline and stable Postop Assessment: no apparent nausea or vomiting Anesthetic complications: no    Last Vitals:  Vitals:   11/28/17 1148 11/28/17 1226  BP: 120/72   Pulse: (!) 121   Resp: 10   Temp: (!) 36.3 C 37.2 C  SpO2: 100%     Last Pain:  Vitals:   11/28/17 1226  TempSrc: Axillary  PainSc:                  Kennieth RadFitzgerald, Laurie [name] E

## 2017-11-28 NOTE — Anesthesia Preprocedure Evaluation (Addendum)
Anesthesia Evaluation  Patient identified by MRN, date of birth, ID band Patient awake    Reviewed: Allergy & Precautions, NPO status , Patient's Chart, lab work & pertinent test results  Airway Mallampati: II  TM Distance: >3 FB Neck ROM: Full    Dental  (+) Edentulous Upper, Dental Advisory Given   Pulmonary Current Smoker, former smoker,    breath sounds clear to auscultation       Cardiovascular hypertension, Pt. on medications + Peripheral Vascular Disease   Rhythm:Regular  ECHO 2018 EF 55-60%   Neuro/Psych Anxiety Depression Carotids 2014 49% stenosis bilat Left sided weakness CVA, Residual Symptoms    GI/Hepatic negative GI ROS, Neg liver ROS,   Endo/Other  diabetes, Well Controlled, Type 2, Insulin Dependent  Renal/GU negative Renal ROS     Musculoskeletal   Abdominal (+)  Abdomen: soft.    Peds  Hematology  (+) anemia ,   Anesthesia Other Findings   Reproductive/Obstetrics                           Lab Results  Component Value Date   WBC 8.7 11/28/2017   HGB 10.9 (L) 11/28/2017   HCT 34.9 (L) 11/28/2017   MCV 92.6 11/28/2017   PLT 349 11/28/2017   Lab Results  Component Value Date   CREATININE 0.88 11/28/2017   BUN 18 11/28/2017   NA 143 11/28/2017   K 4.0 11/28/2017   CL 106 11/28/2017   CO2 25 11/28/2017    Anesthesia Physical  Anesthesia Plan  ASA: III  Anesthesia Plan: General   Post-op Pain Management:    Induction: Intravenous  PONV Risk Score and Plan: 2 and Ondansetron, Dexamethasone and Treatment may vary due to age or medical condition  Airway Management Planned: LMA  Additional Equipment:   Intra-op Plan:   Post-operative Plan: Extubation in OR  Informed Consent: I have reviewed the patients History and Physical, chart, labs and discussed the procedure including the risks, benefits and alternatives for the proposed anesthesia with the  patient or authorized representative who has indicated his/her understanding and acceptance.   Dental advisory given  Plan Discussed with: CRNA  Anesthesia Plan Comments:         Anesthesia Quick Evaluation

## 2017-11-28 NOTE — Op Note (Signed)
11/28/2017  10:24 AM  PATIENT:  Laurie [name]    PRE-OPERATIVE DIAGNOSIS:  gangrene left foot  POST-OPERATIVE DIAGNOSIS:  Same  PROCEDURE:  AMPUTATION ABOVE KNEE, APPLICATION OF WOUND VAC  SURGEON:  Nadara MustardMarcus V Javana Schey, MD  PHYSICIAN ASSISTANT:None ANESTHESIA:   General  PREOPERATIVE INDICATIONS:  Laurie [name] is a  71 y.o. female with a diagnosis of gangrene left foot who failed conservative measures and elected for surgical management.    The risks benefits and alternatives were discussed with the patient preoperatively including but not limited to the risks of infection, bleeding, nerve injury, cardiopulmonary complications, the need for revision surgery, among others, and the patient was willing to proceed.  OPERATIVE IMPLANTS: Praveena wound VAC  OPERATIVE FINDINGS: Calcified vessels.  OPERATIVE PROCEDURE: Patient was brought to the operating room and underwent a general anesthetic.  After adequate levels of anesthesia were obtained patient's left lower extremity was prepped using DuraPrep draped in the sterile field a timeout was called.  A fishmouth incision was made just proximal to the patella.  Electrocautery was used for hemostasis.  The femoral vessels were clamped and suture-ligated with 2-0 silk.  The femur was transected with a oscillating saw.  The deep and superficial fascial layers and skin was closed using 2-0 nylon.  A Praveena wound VAC incisional VAC was applied this had a good suction fit patient was extubated taken to PACU in stable condition.   DISCHARGE PLANNING:  Antibiotic duration: 24-hour postoperative IV antibiotics for the limb.  The margins were clear.  Weightbearing: Patient will be transfer training  Pain medication: Pain medication as needed  Dressing care/ Wound VAC:.  Wound VAC for 1 week then start dry dressing changes.  Ambulatory devices: Patient will be total assistance with a Hoyer lift  Discharge to:[facility].  Discharge to skilled  nursing.  Follow-up: In the office 1 week post operative.

## 2017-11-28 NOTE — Transfer of Care (Signed)
Immediate Anesthesia Transfer of Care Note  Patient: Laurie [name]  Procedure(s) Performed: AMPUTATION ABOVE KNEE (Left Leg Lower) APPLICATION OF WOUND VAC (Left Leg Lower)  Patient Location: PACU  Anesthesia Type:General  Level of Consciousness: awake and alert   Airway & Oxygen Therapy: Patient Spontanous Breathing and Patient connected to face mask oxygen  Post-op Assessment: Report given to RN, Post -op Vital signs reviewed and stable and Patient moving all extremities X 4  Post vital signs: Reviewed and stable  Last Vitals:  Vitals:   11/27/17 2000 11/27/17 2146  BP:  131/81  Pulse: 92   Resp: 15   Temp:  37.1 C  SpO2: 97%     Last Pain:  Vitals:   11/27/17 2150  TempSrc:   PainSc: Asleep      Patients Stated Pain Goal: 0 (11/27/17 0925)  Complications: No apparent anesthesia complications

## 2017-11-28 NOTE — Progress Notes (Signed)
     Subjective: Day of Surgery Procedure(s) (LRB): AMPUTATION ABOVE KNEE (Left) APPLICATION OF WOUND VAC (Left) Family at bedside, spoke with daughter via phone explained the necessity of above knee amputation to allow healing, anything lower not likely to heal. VAC intact. Minimal drainage.  Patient reports pain as mild.    Objective:   VITALS:  Temp:  [97.3 F (36.3 C)-99 F (37.2 C)] 98.3 F (36.8 C) (12/08 1607) Pulse Rate:  [92-128] 121 (12/08 1148) Resp:  [9-21] 10 (12/08 1148) BP: (120-144)/(56-83) 120/72 (12/08 1148) SpO2:  [97 %-100 %] 100 % (12/08 1148) Weight:  [195 lb 15.8 oz (88.9 kg)] 195 lb 15.8 oz (88.9 kg) (12/08 0526)  ABD soft Incision: dressing C/D/I   LABS Recent Labs    11/26/17 0718 11/28/17 0312  HGB 11.3* 10.9*  WBC 8.5 8.7  PLT 332 349   Recent Labs    11/27/17 0153 11/28/17 0312  NA 142 143  K 3.0* 4.0  CL 105 106  CO2 26 25  BUN 28* 18  CREATININE 0.98 0.88  GLUCOSE 180* 119*   No results for input(s): LABPT, INR in the last 72 hours.   Assessment/Plan: Day of Surgery Procedure(s) (LRB): AMPUTATION ABOVE KNEE (Left) APPLICATION OF WOUND VAC (Left)  Continue ABX therapy due to Post-op infection  When more alert PT/OT to teach transfers and to help mobilize. Needs to log roll to prevent pressure sores.   Vira BrownsJames Tierre Gerard 11/28/2017, 4:50 PMPatient ID: Druscilla BrownieAnne W Bastin, female   DOB: Feb 21, 1946, 71 y.o.   MRN: [id number]

## 2017-11-29 ENCOUNTER — Encounter (HOSPITAL_COMMUNITY): Payer: Self-pay | Admitting: Orthopedic Surgery

## 2017-11-29 LAB — GLUCOSE, CAPILLARY
GLUCOSE-CAPILLARY: 205 mg/dL — AB (ref 65–99)
Glucose-Capillary: 147 mg/dL — ABNORMAL HIGH (ref 65–99)
Glucose-Capillary: 126 mg/dL — ABNORMAL HIGH (ref 65–99)
Glucose-Capillary: 107 mg/dL — ABNORMAL HIGH (ref 65–99)
Glucose-Capillary: 136 mg/dL — ABNORMAL HIGH (ref 65–99)

## 2017-11-29 LAB — BASIC METABOLIC PANEL
Anion gap: 10 (ref 5–15)
BUN: 22 mg/dL — ABNORMAL HIGH (ref 6–20)
CHLORIDE: 106 mmol/L (ref 101–111)
CO2: 25 mmol/L (ref 22–32)
CREATININE: 1.18 mg/dL — AB (ref 0.44–1.00)
Calcium: 8.1 mg/dL — ABNORMAL LOW (ref 8.9–10.3)
GFR calc Af Amer: 53 mL/min — ABNORMAL LOW (ref >60)
GFR, EST NON AFRICAN AMERICAN: 46 mL/min — AB (ref >60)
Glucose, Bld: 148 mg/dL — ABNORMAL HIGH (ref 65–99)
Potassium: 3.6 mmol/L (ref 3.5–5.1)
Sodium: 141 mmol/L (ref 135–145)

## 2017-11-29 LAB — CBC WITH DIFFERENTIAL/PLATELET
Basophils Absolute: 0 10*3/uL (ref 0.0–0.1)
Basophils Relative: 0 %
EOS ABS: 0.1 10*3/uL (ref 0.0–0.7)
Eosinophils Relative: 1 %
HEMATOCRIT: 29.2 % — AB (ref 36.0–46.0)
HEMOGLOBIN: 9 g/dL — AB (ref 12.0–15.0)
LYMPHS ABS: 2.5 10*3/uL (ref 0.7–4.0)
Lymphocytes Relative: 23 %
MCH: 28.9 pg (ref 26.0–34.0)
MCHC: 30.8 g/dL (ref 30.0–36.0)
MCV: 93.9 fL (ref 78.0–100.0)
MONO ABS: 0.8 10*3/uL (ref 0.1–1.0)
MONOS PCT: 7 %
NEUTROS ABS: 7.3 10*3/uL (ref 1.7–7.7)
NEUTROS PCT: 69 %
Platelets: 313 10*3/uL (ref 150–400)
RBC: 3.11 MIL/uL — ABNORMAL LOW (ref 3.87–5.11)
RDW: 13.8 % (ref 11.5–15.5)
WBC: 10.6 10*3/uL — ABNORMAL HIGH (ref 4.0–10.5)

## 2017-11-29 LAB — CULTURE, BLOOD (ROUTINE X 2)
CULTURE: NO GROWTH
Culture: NO GROWTH
Special Requests: ADEQUATE
Special Requests: ADEQUATE

## 2017-11-29 NOTE — Progress Notes (Signed)
PROGRESS NOTE    Laurie [name]  RUE:[id number] DOB: 04/23/1946 DOA: 11/24/2017 PCP: Rometta Emery, MD   Brief Narrative:71 y.o.femalewith medical history significant forchronic diastolic heart failure on ACE inhibitor, Zaroxolyn and Demadex, peripheral vascular disease with prior right BKA, chronic left heel ulcer, anemia of chronic kidney disease, diabetes on insulin, dyslipidemia, hypertension and narcolepsy. She has had altered mentation since last Friday and was taken to her PCP office. Subsequently the facility has been notified of abnormal renal function labs. EMS was called to the nurse facility because of worsened mentation. Patient's blood pressure was suboptimal and she was given 300 cc normal saline by the paramedics. Upon presentation patient's initial blood pressure was 63/20 with a pulse of 111, she had a rectal temperature of 99.2. Chest x-ray revealed possible right midlung zone pneumonia. Patient had mild leukocytosis. She has a chronic left heel ulcer which bleeds easily and partially covered by necrotic base and does have somewhat of a foul odor. Left foot x-ray without fracture, dislocation or evidence of osteomyelitis. Patient's lactic acid was mildly elevated at 2.3 in the context of severe acute kidney injury with a BUN of 174 and a creatinine of 5.17.With elevated lactic acid, hypotension, acute kidney injury and possible findings of infection in the lung sepsis protocol was initiated by the EDP and she has been given a dose of broad-spectrum empiric antibiotics after blood cultures were obtained. Patient has not made any urine despite fluid resuscitation. Bladder scan showed no urine in the bladder. Urinalysis ordered but has not yet been collected. I have informed the nurse that I will place an order for Foley catheter insertion for accurate intake and output  I saw the patient prior to going for surgery.  Patient is at OR now for left above-knee  amputation.   11/28/2017.  Discussed with her daughter and patient last night and they had agreed for the surgery.  Appreciate Dr. Audrie Lia help.     Assessment & Plan:   Principal Problem:   Sepsis due to pneumonia vs foot ulcer Active Problems:   Gangrene of left foot (HCC)   Acute kidney injury (HCC)   Diabetes mellitus, insulin dependent (IDDM), controlled (HCC)   Hypertension   Chronic diastolic heart failure, NYHA class 1 (HCC)   Type 1 diabetes mellitus with left diabetic foot ulcer (HCC)   High cholesterol   Anemia, chronic disease   PAD (peripheral artery disease) s/p right BKA   Sacral decubitus ulcer, stage II   Acute metabolic encephalopathy  S/P LEFT AKA PNEUMONIA CKD HTN DM  PLAN-SW CONSULT FOR SNF PLACEMENT.DC ANTIBIOTICS IF OK WITH ORTHO.   DVT prophylaxis: LOVENOX Code Status: FULL Family Communication: NONE  Disposition Plan:TO SNF SOON Consultants:    ORTHO Procedures:LEFT AKA Antimicrobials: VANCO/ZOSYN Subjective: COMPLAINIG OF PAIN  Objective:RESTING IN BED.in MILD DISTRESS Vitals:   11/29/17 0540 11/29/17 0833 11/29/17 0910 11/29/17 1208  BP: 131/63  135/73   Pulse: (!) 102  94   Resp: 16     Temp:  98.7 F (37.1 C)  99.4 F (37.4 C)  TempSrc:  Oral  Oral  SpO2: 98%  96%   Weight:      Height:        Intake/Output Summary (Last 24 hours) at 11/29/2017 1332 Last data filed at 11/29/2017 1000 Gross per 24 hour  Intake 90 ml  Output 400 ml  Net -310 ml   Filed Weights   11/26/17 0427 11/27/17 0500 11/28/17 0526  Weight: 88.4  kg (194 lb 14.2 oz) 88.6 kg (195 lb 5.2 oz) 88.9 kg (195 lb 15.8 oz)    Examination:  General exam: Appears calm and comfortable  Respiratory system: Clear to auscultation. Respiratory effort normal. Cardiovascular system: S1 & S2 heard, RRR. No JVD, murmurs, rubs, gallops or clicks. No pedal edema. Gastrointestinal system: Abdomen is nondistended, soft and nontender. No organomegaly or masses felt.  Normal bowel sounds heard. Central nervous system: Alert and oriented. No focal neurological deficits. EXT RBKA LEFT AKA Skin: No rashes, lesions or ulcers Psychiatry: Judgement and insight appear normal. Mood & affect appropriate.     Data Reviewed: I have personally reviewed following labs and imaging studies  CBC: Recent Labs  Lab 11/24/17 1247 11/25/17 0346 11/26/17 0718 11/28/17 0312 11/29/17 0336  WBC 10.8* 10.2 8.5 8.7 10.6*  NEUTROABS  --   --  7.1 5.6 7.3  HGB 11.4* 10.6* 11.3* 10.9* 9.0*  HCT 34.2* 32.8* 34.7* 34.9* 29.2*  MCV 89.5 90.6 91.8 92.6 93.9  PLT 355 323 332 349 313   Basic Metabolic Panel: Recent Labs  Lab 11/25/17 0346 11/26/17 0718 11/27/17 0153 11/28/17 0312 11/29/17 0336  NA 141 146* 142 143 141  K 3.1* 3.5 3.0* 4.0 3.6  CL 102 106 105 106 106  CO2 20* 25 26 25 25   GLUCOSE 148* 199* 180* 119* 148*  BUN 124* 57* 28* 18 22*  CREATININE 2.62* 1.15* 0.98 0.88 1.18*  CALCIUM 8.3* 9.0 8.8* 8.6* 8.1*  MG  --  1.6*  --   --   --   PHOS  --  1.9*  --   --   --    GFR: Estimated Creatinine Clearance: 51.5 mL/min (A) (by C-G formula based on SCr of 1.18 mg/dL (H)). Liver Function Tests: Recent Labs  Lab 11/24/17 1247 11/25/17 0346 11/26/17 0718  AST 50* 57*  --   ALT 22 26  --   ALKPHOS 92 79  --   BILITOT 0.8 0.7  --   PROT 8.1 7.6  --   ALBUMIN 3.0* 2.8* 2.8*   No results for input(s): LIPASE, AMYLASE in the last 168 hours. No results for input(s): AMMONIA in the last 168 hours. Coagulation Profile: Recent Labs  Lab 11/24/17 1744  INR 1.21   Cardiac Enzymes: Recent Labs  Lab 11/24/17 1744  CKTOTAL 3,946*   BNP (last 3 results) No results for input(s): PROBNP in the last 8760 hours. HbA1C: No results for input(s): HGBA1C in the last 72 hours. CBG: Recent Labs  Lab 11/28/17 2022 11/28/17 2340 11/29/17 0403 11/29/17 0830 11/29/17 1207  GLUCAP 149* 134* 147* 126* 107*   Lipid Profile: No results for input(s): CHOL,  HDL, LDLCALC, TRIG, CHOLHDL, LDLDIRECT in the last 72 hours. Thyroid Function Tests: No results for input(s): TSH, T4TOTAL, FREET4, T3FREE, THYROIDAB in the last 72 hours. Anemia Panel: No results for input(s): VITAMINB12, FOLATE, FERRITIN, TIBC, IRON, RETICCTPCT in the last 72 hours. Sepsis Labs: Recent Labs  Lab 11/24/17 1314 11/24/17 1744 11/24/17 2049  PROCALCITON  --  0.46  --   LATICACIDVEN 2.30* 1.0 1.1    Recent Results (from the past 240 hour(s))  Blood culture (routine x 2)     Status: None (Preliminary result)   Collection Time: 11/24/17  1:33 PM  Result Value Ref Range Status   Specimen Description BLOOD LEFT ANTECUBITAL  Final   Special Requests   Final    BOTTLES DRAWN AEROBIC ONLY Blood Culture adequate volume   Culture  NO GROWTH 4 DAYS  Final   Report Status PENDING  Incomplete  Blood culture (routine x 2)     Status: None (Preliminary result)   Collection Time: 11/24/17  1:33 PM  Result Value Ref Range Status   Specimen Description BLOOD LEFT HAND  Final   Special Requests   Final    BOTTLES DRAWN AEROBIC ONLY Blood Culture adequate volume   Culture NO GROWTH 4 DAYS  Final   Report Status PENDING  Incomplete  Urine Culture     Status: Abnormal   Collection Time: 11/24/17  5:58 PM  Result Value Ref Range Status   Specimen Description URINE, RANDOM  Final   Special Requests NONE  Final   Culture >=100,000 COLONIES/mL ESCHERICHIA COLI (A)  Final   Report Status 11/26/2017 FINAL  Final   Organism ID, Bacteria ESCHERICHIA COLI (A)  Final      Susceptibility   Escherichia coli - MIC*    AMPICILLIN <=2 SENSITIVE Sensitive     CEFAZOLIN <=4 SENSITIVE Sensitive     CEFTRIAXONE <=1 SENSITIVE Sensitive     CIPROFLOXACIN <=0.25 SENSITIVE Sensitive     GENTAMICIN <=1 SENSITIVE Sensitive     IMIPENEM <=0.25 SENSITIVE Sensitive     NITROFURANTOIN <=16 SENSITIVE Sensitive     TRIMETH/SULFA <=20 SENSITIVE Sensitive     AMPICILLIN/SULBACTAM <=2 SENSITIVE Sensitive      PIP/TAZO <=4 SENSITIVE Sensitive     Extended ESBL NEGATIVE Sensitive     * >=100,000 COLONIES/mL ESCHERICHIA COLI  Respiratory Panel by PCR     Status: None   Collection Time: 11/25/17  5:55 PM  Result Value Ref Range Status   Adenovirus NOT DETECTED NOT DETECTED Final   Coronavirus 229E NOT DETECTED NOT DETECTED Final   Coronavirus HKU1 NOT DETECTED NOT DETECTED Final   Coronavirus NL63 NOT DETECTED NOT DETECTED Final   Coronavirus OC43 NOT DETECTED NOT DETECTED Final   Metapneumovirus NOT DETECTED NOT DETECTED Final   Rhinovirus / Enterovirus NOT DETECTED NOT DETECTED Final   Influenza A NOT DETECTED NOT DETECTED Final   Influenza B NOT DETECTED NOT DETECTED Final   Parainfluenza Virus 1 NOT DETECTED NOT DETECTED Final   Parainfluenza Virus 2 NOT DETECTED NOT DETECTED Final   Parainfluenza Virus 3 NOT DETECTED NOT DETECTED Final   Parainfluenza Virus 4 NOT DETECTED NOT DETECTED Final   Respiratory Syncytial Virus NOT DETECTED NOT DETECTED Final   Bordetella pertussis NOT DETECTED NOT DETECTED Final   Chlamydophila pneumoniae NOT DETECTED NOT DETECTED Final   Mycoplasma pneumoniae NOT DETECTED NOT DETECTED Final  MRSA PCR Screening     Status: Abnormal   Collection Time: 11/25/17  8:45 PM  Result Value Ref Range Status   MRSA by PCR POSITIVE (A) NEGATIVE Final    Comment:        The GeneXpert MRSA Assay (FDA approved for NASAL specimens only), is one component of a comprehensive MRSA colonization surveillance program. It is not intended to diagnose MRSA infection nor to guide or monitor treatment for MRSA infections. RESULT CALLED TO, READ BACK BY AND VERIFIED WITH: Rhae Lerner RN 0330 11/26/17 A BROWNING   Surgical pcr screen     Status: Abnormal   Collection Time: 11/28/17  1:02 AM  Result Value Ref Range Status   MRSA, PCR POSITIVE (A) NEGATIVE Final    Comment: RESULT CALLED TO, READ BACK BY AND VERIFIED WITH: L EI RN 0530 11/28/17 A BROWNING    Staphylococcus aureus  POSITIVE (A) NEGATIVE  Final    Comment: (NOTE) The Xpert SA Assay (FDA approved for NASAL specimens in patients 69 years of age and older), is one component of a comprehensive surveillance program. It is not intended to diagnose infection nor to guide or monitor treatment.          Radiology Studies: Dg Chest 1 View  Result Date: 11/27/2017 CLINICAL DATA:  Cough and chest pain EXAM: CHEST 1 VIEW COMPARISON:  November 24, 2017 FINDINGS: There is persistent patchy opacity in the right base. Lungs elsewhere are clear. Heart is borderline enlarged with pulmonary vascularity within normal limits. No adenopathy. No bone lesions. IMPRESSION: Persistent patchy opacity right mid lung, likely a focus of pneumonia. No new airspace opacity. Stable cardiac prominence. No adenopathy evident. Electronically Signed   By: Bretta BangWilliam  Woodruff III M.D.   On: 11/27/2017 18:59        Scheduled Meds: . Chlorhexidine Gluconate Cloth  6 each Topical Q0600  . clopidogrel  75 mg Oral Daily  . docusate sodium  100 mg Oral BID  . DULoxetine  60 mg Oral Daily  . famotidine  20 mg Oral Daily  . heparin  5,000 Units Subcutaneous Q8H  . insulin aspart  0-15 Units Subcutaneous Q4H  . metoprolol tartrate  10 mg Intravenous Q6H  . mupirocin ointment  1 application Nasal BID  . sodium chloride flush  3 mL Intravenous Q12H   Continuous Infusions: . sodium chloride 10 mL/hr at 11/28/17 1713  . ceFEPime (MAXIPIME) IV Stopped (11/29/17 1015)  . lactated ringers    . vancomycin Stopped (11/29/17 0430)     LOS: 5 days       Alwyn RenElizabeth G Mathews, MD [facility]  If 7PM-7AM, please contact night-coverage [email or web address] Password [email or web address] 11/29/2017, 1:32 PM

## 2017-11-29 NOTE — Evaluation (Signed)
Physical Therapy Evaluation Patient Details Name: Laurie [name] MRN: [id number] DOB: [birth year] Today's Date: 11/29/2017   History of Present Illness  71 yo female presenting with recent L AKA after gangrene L foot. PHM including R BKA, Type 2 DM, acute kidney injury, HTN, and anemia.   Clinical Impression  Pt presents with the above diagnosis and below deficits for therapy evaluation. Prior to admission, pt lived in an ALF and [facility]. Pt now presents s/p left AKA and is in excruciating pain at 10/10. Pt requires +2 total assist to perform supine <> sit and was unable to maintain sitting at EOB. RN was giving pain medication at the completion of this session. Pt will benefit from SNF at discharge in order to maximize her functional outcomes before returning to ALF.     Follow Up Recommendations SNF    Equipment Recommendations  None recommended by PT    Recommendations for Other Services       Precautions / Restrictions Precautions Precautions: Fall Precaution Comments: Recent L AKA; prior R bka Restrictions Weight Bearing Restrictions: Yes LLE Weight Bearing: Non weight bearing Other Position/Activity Restrictions: Wound vac      Mobility  Bed Mobility Overal bed mobility: Needs Assistance Bed Mobility: Supine to Sit;Sit to Supine     Supine to sit: Total assist;+2 for physical assistance Sit to supine: Total assist;+2 for physical assistance   General bed mobility comments: Pt requiring total A +2 for all bed mobility due to pain  Transfers                    Ambulation/Gait             General Gait Details: NT  Stairs            Wheelchair Mobility    Modified Rankin (Stroke Patients Only)       Balance Overall balance assessment: Needs assistance Sitting-balance support: Bilateral upper extremity supported;Feet unsupported Sitting balance-Leahy Scale: Zero Sitting balance - Comments: Required  physical A to maintain sitting.  Postural control: Right lateral lean                                   Pertinent Vitals/Pain Pain Assessment: Faces Faces Pain Scale: Hurts worst Pain Location: LLE and residual limb and L hip Pain Descriptors / Indicators: Constant;Discomfort;Grimacing;Guarding;Moaning Pain Intervention(s): Monitored during session;Repositioned;RN gave pain meds during session;Patient requesting pain meds-RN notified    Home Living Family/patient expects to be discharged to:: Assisted living               Home Equipment: Walker - 2 wheels;Shower seat;Wheelchair - manual      Prior Function Level of Independence: Needs assistance   Gait / Transfers Assistance Needed: uses w/c  ADL's / Homemaking Assistance Needed: Reports she was able to perform dressing and bathing        Hand Dominance   Dominant Hand: Right    Extremity/Trunk Assessment   Upper Extremity Assessment Upper Extremity Assessment: Defer to OT evaluation    Lower Extremity Assessment Lower Extremity Assessment: LLE deficits/detail RLE Deficits / Details: Prior R BKA LLE Deficits / Details: Recent L AKA    Cervical / Trunk Assessment Cervical / Trunk Assessment: Other exceptions(increased body habitus)  Communication   Communication: Other (comment)(distracted by pain)  Cognition Arousal/Alertness: Lethargic Behavior During Therapy: Anxious;Flat affect Overall Cognitive  Status: No family/caregiver present to determine baseline cognitive functioning                                 General Comments: Pt very distracted by pain. Presenting with decreased attention, follow commands, and required increased time and cues for bed mobility.       General Comments      Exercises     Assessment/Plan    PT Assessment Patient needs continued PT services  PT Problem List Decreased strength;Decreased activity tolerance;Decreased balance;Decreased  mobility;Pain       PT Treatment Interventions DME instruction;Functional mobility training;Therapeutic activities;Therapeutic exercise;Balance training    PT Goals (Current goals can be found in the Care Plan section)  Acute Rehab PT Goals Patient Stated Goal: Stop pain PT Goal Formulation: Patient unable to participate in goal setting Time For Goal Achievement: 12/06/17 Potential to Achieve Goals: Fair    Frequency Min 3X/week   Barriers to discharge        Co-evaluation PT/OT/SLP Co-Evaluation/Treatment: Yes Reason for Co-Treatment: Complexity of the patient's impairments (multi-system involvement);For patient/therapist safety PT goals addressed during session: Mobility/safety with mobility OT goals addressed during session: ADL's and self-care       AM-PAC PT "6 Clicks" Daily Activity  Outcome Measure Difficulty turning over in bed (including adjusting bedclothes, sheets and blankets)?: Unable Difficulty moving from lying on back to sitting on the side of the bed? : Unable Difficulty sitting down on and standing up from a chair with arms (e.g., wheelchair, bedside commode, etc,.)?: Unable Help needed moving to and from a bed to chair (including a wheelchair)?: Total Help needed walking in hospital room?: Total Help needed climbing 3-5 steps with a railing? : Total 6 Click Score: 6    End of Session   Activity Tolerance: Patient limited by pain Patient left: in bed;with call bell/phone within reach;with nursing/sitter in room;with bed alarm set Nurse Communication: Mobility status;Patient requests pain meds PT Visit Diagnosis: Unsteadiness on feet (R26.81);Muscle weakness (generalized) (M62.81);Pain Pain - Right/Left: Left Pain - part of body: Leg    Time: [id number] PT Time Calculation (min) (ACUTE ONLY): 16 min   Charges:   PT Evaluation $PT Eval Moderate Complexity: 1 Mod     PT G Codes:        Laurie [name] PT, DPT    Laurie [name] 11/29/2017,  1:14 PM

## 2017-11-29 NOTE — Evaluation (Signed)
Occupational Therapy Evaluation Patient Details Name: Laurie [name] MRN: [id number] DOB: 1946-11-08 Today's Date: 11/29/2017    History of Present Illness 71 yo female presenting with recent L AKA after gangrene L foot. PHM including R BKA, Type 2 DM, acute kidney injury, HTN, and anemia.    Clinical Impression   PTA, pt was living at ALF and reports that she was performing BADLs and using a w/c for mobility. Pt currently requiring Max A-Total A for bathing, dressing, and toileting and is significantly impacted by pain in L residual limb and L hip. Pt would benefit form further acute OT to facilitate safe dc. Recommend dc to SNF for further OT to optimize safety and independence with ADLs and functional mobility.     Follow Up Recommendations  SNF    Equipment Recommendations  Other (comment)(Defer to next venue)    Recommendations for Other Services PT consult     Precautions / Restrictions Precautions Precautions: Fall Precaution Comments: Recent L AKA; prior R bka Restrictions Weight Bearing Restrictions: Yes LLE Weight Bearing: Non weight bearing Other Position/Activity Restrictions: Wound vac      Mobility Bed Mobility Overal bed mobility: Needs Assistance Bed Mobility: Supine to Sit;Sit to Supine     Supine to sit: Total assist;+2 for physical assistance Sit to supine: Total assist;+2 for physical assistance   General bed mobility comments: Pt requiring total A +2 for all bed mobility due to pain  Transfers                      Balance Overall balance assessment: Needs assistance Sitting-balance support: Bilateral upper extremity supported;Feet unsupported Sitting balance-Leahy Scale: Zero Sitting balance - Comments: Required physical A to maintain sitting.  Postural control: Right lateral lean                                 ADL either performed or assessed with clinical judgement   ADL Overall ADL's : Needs  assistance/impaired Eating/Feeding: Set up;Bed level   Grooming: Set up;Bed level                                 General ADL Comments: Pt requiring Max-total A for ADL due to pain, decreased cognition, and poor funcitonal performance. Pt presenting with poor sitting balance and requires Max A to maintain sitting EOB. Pt with poor participation      Vision Baseline Vision/History: Cataracts Patient Visual Report: (R eye with false lens due to cataracts)       Perception     Praxis      Pertinent Vitals/Pain Pain Assessment: Faces Faces Pain Scale: Hurts worst Pain Location: LLE and residual limb and L hip Pain Descriptors / Indicators: Constant;Discomfort;Grimacing;Guarding;Moaning Pain Intervention(s): Monitored during session;Limited activity within patient's tolerance;Repositioned;Patient requesting pain meds-RN notified     Hand Dominance Right   Extremity/Trunk Assessment Upper Extremity Assessment Upper Extremity Assessment: Generalized weakness   Lower Extremity Assessment Lower Extremity Assessment: LLE deficits/detail;RLE deficits/detail;Generalized weakness RLE Deficits / Details: Prior R BKA LLE Deficits / Details: Recent L AKA   Cervical / Trunk Assessment Cervical / Trunk Assessment: Other exceptions(increased body habitus)   Communication Communication Communication: Other (comment)(Distracted by pain)   Cognition Arousal/Alertness: Lethargic Behavior During Therapy: Anxious;Flat affect Overall Cognitive Status: No family/caregiver present to determine baseline cognitive functioning  General Comments: Pt very distracted by pain. Presenting with decreased attention, follow commands, and required increased time and cues for bed mobility.    General Comments       Exercises     Shoulder Instructions      Home Living Family/patient expects to be discharged to:: Assisted living                                         Prior Functioning/Environment Level of Independence: Needs assistance  Gait / Transfers Assistance Needed: uses w/c ADL's / Homemaking Assistance Needed: Reports she was able to perform dressing and bathing            OT Problem List: Decreased strength;Decreased range of motion;Decreased activity tolerance;Impaired balance (sitting and/or standing);Decreased cognition;Decreased safety awareness;Decreased coordination;Decreased knowledge of use of DME or AE;Pain      OT Treatment/Interventions: Self-care/ADL training;Therapeutic exercise;Energy conservation;DME and/or AE instruction;Patient/family education;Therapeutic activities    OT Goals(Current goals can be found in the care plan section) Acute Rehab OT Goals Patient Stated Goal: Stop pain OT Goal Formulation: With patient Time For Goal Achievement: 12/13/17 Potential to Achieve Goals: Good ADL Goals Pt Will Perform Grooming: with set-up;with supervision;sitting Pt Will Perform Upper Body Dressing: with supervision;with set-up;sitting Pt Will Perform Lower Body Dressing: with min assist;bed level;with adaptive equipment Pt Will Transfer to Toilet: with min assist;with transfer board;bedside commode;with +2 assist Additional ADL Goal #1: Pt will perform bed mobility with Min A +2 in preparation for ADLs in sitting.  OT Frequency: Min 2X/week   Barriers to D/C:            Co-evaluation PT/OT/SLP Co-Evaluation/Treatment: Yes Reason for Co-Treatment: Complexity of the patient's impairments (multi-system involvement);For patient/therapist safety PT goals addressed during session: Mobility/safety with mobility OT goals addressed during session: ADL's and self-care      AM-PAC PT "6 Clicks" Daily Activity     Outcome Measure Help from another person eating meals?: A Little Help from another person taking care of personal grooming?: A Little Help from another person toileting,  which includes using toliet, bedpan, or urinal?: Total Help from another person bathing (including washing, rinsing, drying)?: Total Help from another person to put on and taking off regular upper body clothing?: A Lot Help from another person to put on and taking off regular lower body clothing?: Total 6 Click Score: 11   End of Session Nurse Communication: Mobility status;Patient requests pain meds;Precautions  Activity Tolerance: Patient limited by pain Patient left: in bed;with call bell/phone within reach;with bed alarm set;with nursing/sitter in room  OT Visit Diagnosis: Unsteadiness on feet (R26.81);Other abnormalities of gait and mobility (R26.89);Muscle weakness (generalized) (M62.81);Pain;Other symptoms and signs involving cognitive function Pain - Right/Left: Left Pain - part of body: Leg                Time: 0950-1007 OT Time Calculation (min): 17 min Charges:  OT General Charges $OT Visit: 1 Visit OT Evaluation $OT Eval Moderate Complexity: 1 Mod G-Codes:     Keshan Reha MSOT, OTR/L Acute Rehab Pager: [phone] Office: [phone]  Theodoro GristCharis M Decklin Weddington 11/29/2017, 11:54 AM

## 2017-11-30 LAB — GLUCOSE, CAPILLARY
GLUCOSE-CAPILLARY: 170 mg/dL — AB (ref 65–99)
GLUCOSE-CAPILLARY: 193 mg/dL — AB (ref 65–99)
GLUCOSE-CAPILLARY: 135 mg/dL — AB (ref 65–99)
Glucose-Capillary: 109 mg/dL — ABNORMAL HIGH (ref 65–99)
Glucose-Capillary: 113 mg/dL — ABNORMAL HIGH (ref 65–99)
Glucose-Capillary: 113 mg/dL — ABNORMAL HIGH (ref 65–99)

## 2017-11-30 MED ORDER — HYDROCORTISONE 1 % EX CREA
1.0000 "application " | TOPICAL_CREAM | Freq: Three times a day (TID) | CUTANEOUS | Status: DC | PRN
  Administered 2017-12-01: 1 via TOPICAL
  Filled 2017-11-30: qty 28

## 2017-11-30 NOTE — Progress Notes (Signed)
PROGRESS NOTE    Laurie [name]  RUE:[id number] DOB: Dec 21, 1946 DOA: 11/24/2017 PCP: Rometta Emery, MD   Brief Narrative:71 y.o.femalewith medical history significant forchronic diastolic heart failure on ACE inhibitor, Zaroxolyn and Demadex, peripheral vascular disease with prior right BKA, chronic left heel ulcer, anemia of chronic kidney disease, diabetes on insulin, dyslipidemia, hypertension and narcolepsy. She has had altered mentation since last Friday and was taken to her PCP office. Subsequently the facility has been notified of abnormal renal function labs. EMS was called to the nurse facility because of worsened mentation. Patient's blood pressure was suboptimal and she was given 300 cc normal saline by the paramedics. Upon presentation patient's initial blood pressure was 63/20 with a pulse of 111, she had a rectal temperature of 99.2. Chest x-ray revealed possible right midlung zone pneumonia. Patient had mild leukocytosis. She has a chronic left heel ulcer which bleeds easily and partially covered by necrotic base and does have somewhat of a foul odor. Left foot x-ray without fracture, dislocation or evidence of osteomyelitis. Patient's lactic acid was mildly elevated at 2.3 in the context of severe acute kidney injury with a BUN of 174 and a creatinine of 5.17.With elevated lactic acid, hypotension, acute kidney injury and possible findings of infection in the lung sepsis protocol was initiated by the EDP and she has been given a dose of broad-spectrum empiric antibiotics after blood cultures were obtained. Patient has not made any urine despite fluid resuscitation. Bladder scan showed no urine in the bladder. Urinalysis ordered but has not yet been collected. I have informed the nurse that I will place an order for Foley catheter insertion for accurate intake and output     Assessment & Plan:   Principal Problem:   Sepsis due to pneumonia vs foot ulcer Active  Problems:   Gangrene of left foot (HCC)   Acute kidney injury (HCC)   Diabetes mellitus, insulin dependent (IDDM), controlled (HCC)   Hypertension   Chronic diastolic heart failure, NYHA class 1 (HCC)   Type 1 diabetes mellitus with left diabetic foot ulcer (HCC)   High cholesterol   Anemia, chronic disease   PAD (peripheral artery disease) s/p right BKA   Sacral decubitus ulcer, stage II   Acute metabolic encephalopathy  S/P LEFT AKA PNEUMONIA CKD HTN DM  PLAN hope to dc her back to snf tomorrow if ok with ortho.    DVT prophylaxis:lovenox Code Status: full Disposition Plan: to snf possibly tomorow Consultants:  ortho Procedures: left aka Antimicrobials:  vanco zosyn Subjective:complains of pain   Objective:resting in bed ..wound vac in place left stump Vitals:   11/30/17 0749 11/30/17 1154 11/30/17 1155 11/30/17 1256  BP:  126/66    Pulse:  98    Resp:  15    Temp: 99.1 F (37.3 C)  98.6 F (37 C) 99.1 F (37.3 C)  TempSrc:      SpO2:  100%    Weight: 78.4 kg (172 lb 13.5 oz)     Height:        Intake/Output Summary (Last 24 hours) at 11/30/2017 1332 Last data filed at 11/30/2017 1154 Gross per 24 hour  Intake 200 ml  Output 2300 ml  Net -2100 ml   Filed Weights   11/27/17 0500 11/28/17 0526 11/30/17 0749  Weight: 88.6 kg (195 lb 5.2 oz) 88.9 kg (195 lb 15.8 oz) 78.4 kg (172 lb 13.5 oz)    Examination:  General exam: Appears calm and comfortable  Respiratory system:  Clear to auscultation. Respiratory effort normal. Cardiovascular system: S1 & S2 heard, RRR. No JVD, murmurs, rubs, gallops or clicks. No pedal edema. Gastrointestinal system: Abdomen is nondistended, soft and nontender. No organomegaly or masses felt. Normal bowel sounds heard. Central nervous system: Alert and oriented. No focal neurological deficits. Extremities: Symmetric 5 x 5 power. Skin: No rashes, lesions or ulcers Psychiatry: Judgement and insight appear normal. Mood &  affect appropriate.     Data Reviewed: I have personally reviewed following labs and imaging studies  CBC: Recent Labs  Lab 11/24/17 1247 11/25/17 0346 11/26/17 0718 11/28/17 0312 11/29/17 0336  WBC 10.8* 10.2 8.5 8.7 10.6*  NEUTROABS  --   --  7.1 5.6 7.3  HGB 11.4* 10.6* 11.3* 10.9* 9.0*  HCT 34.2* 32.8* 34.7* 34.9* 29.2*  MCV 89.5 90.6 91.8 92.6 93.9  PLT 355 323 332 349 313   Basic Metabolic Panel: Recent Labs  Lab 11/25/17 0346 11/26/17 0718 11/27/17 0153 11/28/17 0312 11/29/17 0336  NA 141 146* 142 143 141  K 3.1* 3.5 3.0* 4.0 3.6  CL 102 106 105 106 106  CO2 20* 25 26 25 25   GLUCOSE 148* 199* 180* 119* 148*  BUN 124* 57* 28* 18 22*  CREATININE 2.62* 1.15* 0.98 0.88 1.18*  CALCIUM 8.3* 9.0 8.8* 8.6* 8.1*  MG  --  1.6*  --   --   --   PHOS  --  1.9*  --   --   --    GFR: Estimated Creatinine Clearance: 48.5 mL/min (A) (by C-G formula based on SCr of 1.18 mg/dL (H)). Liver Function Tests: Recent Labs  Lab 11/24/17 1247 11/25/17 0346 11/26/17 0718  AST 50* 57*  --   ALT 22 26  --   ALKPHOS 92 79  --   BILITOT 0.8 0.7  --   PROT 8.1 7.6  --   ALBUMIN 3.0* 2.8* 2.8*   No results for input(s): LIPASE, AMYLASE in the last 168 hours. No results for input(s): AMMONIA in the last 168 hours. Coagulation Profile: Recent Labs  Lab 11/24/17 1744  INR 1.21   Cardiac Enzymes: Recent Labs  Lab 11/24/17 1744  CKTOTAL 3,946*   BNP (last 3 results) No results for input(s): PROBNP in the last 8760 hours. HbA1C: No results for input(s): HGBA1C in the last 72 hours. CBG: Recent Labs  Lab 11/29/17 1658 11/29/17 2030 11/30/17 0030 11/30/17 0413 11/30/17 0752  GLUCAP 205* 136* 109* 113* 113*   Lipid Profile: No results for input(s): CHOL, HDL, LDLCALC, TRIG, CHOLHDL, LDLDIRECT in the last 72 hours. Thyroid Function Tests: No results for input(s): TSH, T4TOTAL, FREET4, T3FREE, THYROIDAB in the last 72 hours. Anemia Panel: No results for input(s):  VITAMINB12, FOLATE, FERRITIN, TIBC, IRON, RETICCTPCT in the last 72 hours. Sepsis Labs: Recent Labs  Lab 11/24/17 1314 11/24/17 1744 11/24/17 2049  PROCALCITON  --  0.46  --   LATICACIDVEN 2.30* 1.0 1.1    Recent Results (from the past 240 hour(s))  Blood culture (routine x 2)     Status: None   Collection Time: 11/24/17  1:33 PM  Result Value Ref Range Status   Specimen Description BLOOD LEFT ANTECUBITAL  Final   Special Requests   Final    BOTTLES DRAWN AEROBIC ONLY Blood Culture adequate volume   Culture NO GROWTH 5 DAYS  Final   Report Status 11/29/2017 FINAL  Final  Blood culture (routine x 2)     Status: None   Collection Time: 11/24/17  1:33 PM  Result Value Ref Range Status   Specimen Description BLOOD LEFT HAND  Final   Special Requests   Final    BOTTLES DRAWN AEROBIC ONLY Blood Culture adequate volume   Culture NO GROWTH 5 DAYS  Final   Report Status 11/29/2017 FINAL  Final  Urine Culture     Status: Abnormal   Collection Time: 11/24/17  5:58 PM  Result Value Ref Range Status   Specimen Description URINE, RANDOM  Final   Special Requests NONE  Final   Culture >=100,000 COLONIES/mL ESCHERICHIA COLI (A)  Final   Report Status 11/26/2017 FINAL  Final   Organism ID, Bacteria ESCHERICHIA COLI (A)  Final      Susceptibility   Escherichia coli - MIC*    AMPICILLIN <=2 SENSITIVE Sensitive     CEFAZOLIN <=4 SENSITIVE Sensitive     CEFTRIAXONE <=1 SENSITIVE Sensitive     CIPROFLOXACIN <=0.25 SENSITIVE Sensitive     GENTAMICIN <=1 SENSITIVE Sensitive     IMIPENEM <=0.25 SENSITIVE Sensitive     NITROFURANTOIN <=16 SENSITIVE Sensitive     TRIMETH/SULFA <=20 SENSITIVE Sensitive     AMPICILLIN/SULBACTAM <=2 SENSITIVE Sensitive     PIP/TAZO <=4 SENSITIVE Sensitive     Extended ESBL NEGATIVE Sensitive     * >=100,000 COLONIES/mL ESCHERICHIA COLI  Respiratory Panel by PCR     Status: None   Collection Time: 11/25/17  5:55 PM  Result Value Ref Range Status   Adenovirus  NOT DETECTED NOT DETECTED Final   Coronavirus 229E NOT DETECTED NOT DETECTED Final   Coronavirus HKU1 NOT DETECTED NOT DETECTED Final   Coronavirus NL63 NOT DETECTED NOT DETECTED Final   Coronavirus OC43 NOT DETECTED NOT DETECTED Final   Metapneumovirus NOT DETECTED NOT DETECTED Final   Rhinovirus / Enterovirus NOT DETECTED NOT DETECTED Final   Influenza A NOT DETECTED NOT DETECTED Final   Influenza B NOT DETECTED NOT DETECTED Final   Parainfluenza Virus 1 NOT DETECTED NOT DETECTED Final   Parainfluenza Virus 2 NOT DETECTED NOT DETECTED Final   Parainfluenza Virus 3 NOT DETECTED NOT DETECTED Final   Parainfluenza Virus 4 NOT DETECTED NOT DETECTED Final   Respiratory Syncytial Virus NOT DETECTED NOT DETECTED Final   Bordetella pertussis NOT DETECTED NOT DETECTED Final   Chlamydophila pneumoniae NOT DETECTED NOT DETECTED Final   Mycoplasma pneumoniae NOT DETECTED NOT DETECTED Final  MRSA PCR Screening     Status: Abnormal   Collection Time: 11/25/17  8:45 PM  Result Value Ref Range Status   MRSA by PCR POSITIVE (A) NEGATIVE Final    Comment:        The GeneXpert MRSA Assay (FDA approved for NASAL specimens only), is one component of a comprehensive MRSA colonization surveillance program. It is not intended to diagnose MRSA infection nor to guide or monitor treatment for MRSA infections. RESULT CALLED TO, READ BACK BY AND VERIFIED WITH: J MIZE RN 0330 11/26/17 A BROWNING   Surgical pcr screen     Status: Abnormal   Collection Time: 11/28/17  1:02 AM  Result Value Ref Range Status   MRSA, PCR POSITIVE (A) NEGATIVE Final    Comment: RESULT CALLED TO, READ BACK BY AND VERIFIED WITH: L EI RN 0530 11/28/17 A BROWNING    Staphylococcus aureus POSITIVE (A) NEGATIVE Final    Comment: (NOTE) The Xpert SA Assay (FDA approved for NASAL specimens in patients 69 years of age and older), is one component of a comprehensive surveillance program. It is  not intended to diagnose infection nor  to guide or monitor treatment.          Radiology Studies: No results found.      Scheduled Meds: . clopidogrel  75 mg Oral Daily  . docusate sodium  100 mg Oral BID  . DULoxetine  60 mg Oral Daily  . famotidine  20 mg Oral Daily  . heparin  5,000 Units Subcutaneous Q8H  . insulin aspart  0-15 Units Subcutaneous Q4H  . metoprolol tartrate  10 mg Intravenous Q6H  . mupirocin ointment  1 application Nasal BID  . sodium chloride flush  3 mL Intravenous Q12H   Continuous Infusions: . sodium chloride 10 mL/hr at 11/28/17 1713  . lactated ringers       LOS: 6 days        Alwyn Ren, MD [facility]  If 7PM-7AM, please contact night-coverage [email or web address] Password [email or web address] 11/30/2017, 1:33 PM

## 2017-11-30 NOTE — Progress Notes (Signed)
  Delivered AD materials. Laurie [name] -low staff available to do AD today.  Page chaplain when ready to complete. Phebe CollaDonna S Motty [name], Chaplain   11/30/17 1000  Clinical Encounter Type  Visited With Patient  Visit Type Initial;Spiritual support;Other (Comment) (Gave AD materials as requested in Consult. )  Referral From Nurse  Consult/Referral To Chaplain  Spiritual Encounters  Spiritual Needs Prayer;Other (Comment) (AD materials)  Stress Factors  Patient Stress Factors None identified  Family Stress Factors None identified  Advance Directives (For Healthcare)  Does Patient Have a Medical Advance Directive? No  Would patient like information on creating a medical advance directive? Yes (Inpatient - patient requests chaplain consult to create a medical advance directive)

## 2017-11-30 NOTE — Progress Notes (Signed)
Patient ID: Laurie [name], female   DOB: 03-Oct-1946, 71 y.o.   MRN: [id number] Postoperative day 2 left above-knee amputation.  The wound VAC has minimal drainage.  Anticipate discharge to skilled nursing.  Plan on discontinuing the wound VAC and apply a dry dressing at the time of discharge.

## 2017-12-01 LAB — GLUCOSE, CAPILLARY
GLUCOSE-CAPILLARY: 110 mg/dL — AB (ref 65–99)
GLUCOSE-CAPILLARY: 114 mg/dL — AB (ref 65–99)
Glucose-Capillary: 121 mg/dL — ABNORMAL HIGH (ref 65–99)
Glucose-Capillary: 122 mg/dL — ABNORMAL HIGH (ref 65–99)
Glucose-Capillary: 133 mg/dL — ABNORMAL HIGH (ref 65–99)
Glucose-Capillary: 164 mg/dL — ABNORMAL HIGH (ref 65–99)

## 2017-12-01 MED ORDER — MUPIROCIN 2 % EX OINT: 1.0000 "application " | TOPICAL_OINTMENT | Freq: Two times a day (BID) | CUTANEOUS | 0 refills | Status: AC

## 2017-12-01 MED ORDER — TORSEMIDE 100 MG PO TABS: 50.0000 mg | ORAL_TABLET | Freq: Every day | ORAL | 0 refills | Status: AC

## 2017-12-01 MED ORDER — DOCUSATE SODIUM 100 MG PO CAPS
100.0000 mg | ORAL_CAPSULE | Freq: Two times a day (BID) | ORAL | 0 refills | Status: AC
Start: 2017-12-01 — End: ?

## 2017-12-01 MED ORDER — HYDROCORTISONE 1 % EX CREA: 1.0000 "application " | TOPICAL_CREAM | Freq: Three times a day (TID) | CUTANEOUS | 0 refills | Status: AC | PRN

## 2017-12-01 MED ORDER — ONDANSETRON HCL 4 MG PO TABS: 4.0000 mg | ORAL_TABLET | Freq: Four times a day (QID) | ORAL | 0 refills | Status: AC | PRN

## 2017-12-01 MED ORDER — TORSEMIDE 100 MG PO TABS: 50.0000 mg | ORAL_TABLET | Freq: Every day | ORAL | 0 refills | Status: DC

## 2017-12-01 MED ORDER — POLYETHYLENE GLYCOL 3350 17 G PO PACK: 17.0000 g | PACK | Freq: Every day | ORAL | 0 refills | Status: AC | PRN

## 2017-12-01 MED ORDER — OXYCODONE HCL 5 MG PO TABS: 5.0000 mg | ORAL_TABLET | ORAL | 0 refills | Status: DC | PRN

## 2017-12-01 MED ORDER — INSULIN GLARGINE 300 UNIT/ML ~~LOC~~ SOPN: 15.0000 [IU] | PEN_INJECTOR | Freq: Every day | SUBCUTANEOUS | 0 refills | Status: AC

## 2017-12-01 MED ORDER — OXYCODONE HCL 5 MG PO TABS: 5.0000 mg | ORAL_TABLET | ORAL | Status: DC | PRN

## 2017-12-01 MED ORDER — ACETAMINOPHEN 325 MG PO TABS: 650.0000 mg | ORAL_TABLET | ORAL | Status: AC | PRN

## 2017-12-01 NOTE — Clinical Social Work Placement (Signed)
   CLINICAL SOCIAL WORK PLACEMENT  NOTE  Date:  12/01/2017  Patient Details  Name: Laurie [name] MRN: [id number] Date of Birth: [birth year]  Clinical Social Work is seeking post-discharge placement for this patient at the Skilled  Nursing Facility level of care (*CSW will initial, date and re-position this form in  chart as items are completed):  Yes   Patient/family provided with [facility] Clinical Social Work Department's list of facilities offering this level of care within the geographic area requested by the patient (or if unable, by the patient's family).  Yes   Patient/family informed of their freedom to choose among providers that offer the needed level of care, that participate in Medicare, Medicaid or managed care program needed by the patient, have an available bed and are willing to accept the patient.  Yes   Patient/family informed of [facility]'s ownership interest [facility] and [facility], as well as of the fact that they are under no obligation to receive care at these facilities.  PASRR submitted to EDS on       PASRR number received on       Existing PASRR number confirmed on 12/01/17     FL2 transmitted to all facilities in geographic area requested by pt/family on 12/01/17     FL2 transmitted to all facilities within larger geographic area on       Patient informed that his/her managed care company has contracts with or will negotiate with certain facilities, including the following:        Yes   Patient/family informed of bed offers received.  [facility]     Physician recommends and patient chooses bed at      [facility] on 12/01/17.  Patient to be transferred to facility by PTAR     Patient family notified on 12/01/17 of transfer.  Name of family member notified:  Shanda BumpsJessica     PHYSICIAN       Additional Comment:     _______________________________________________ Mearl LatinNadia S Malana Eberwein, LCSWA 12/01/2017, 4:46 PM

## 2017-12-01 NOTE — Progress Notes (Signed)
Druscilla BrownieAnne W Mcglaughlin to be D/C'd Skilled nursing facility per MD order.  Discussed with the patient and all questions fully answered.  VSS, IV catheter discontinued intact. Site without signs and symptoms of complications. Dressing and pressure applied.  An After Visit Summary was printed and given to the patient. Patient received prescription.  [facility]. All questions answered.  Patient waiting on PTAR to D/C. Will continue to assess.  Grayling Congressvan J Anjela Cassara 12/01/2017 6:17 PM

## 2017-12-01 NOTE — Progress Notes (Signed)
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 PHM including R BKA, Type 2 DM, acute kidney injury, HTN, and anemia.     PT Comments    PT treatment session x2 for transfer to and from recliner. Pt is making progress towards her goals and is currently modAx2 for bed mobility and modAx2 for posterior transfer to recliner. Pt fatigued and unwilling to assist in anterior transfer from recliner to bed requiring total Ax2. D/c plan remains appropriate and PT will continue to follow pt acutely.     Follow Up Recommendations  SNF     Equipment Recommendations  None recommended by PT    Recommendations for Other Services       Precautions / Restrictions Precautions Precautions: Fall Precaution Comments: Recent L AKA; prior R bka Restrictions Weight Bearing Restrictions: Yes LLE Weight Bearing: Non weight bearing Other Position/Activity Restrictions: Wound vac    Mobility  Bed Mobility Overal bed mobility: Needs Assistance Bed Mobility: Sit to Supine     Supine to sit: +2 for physical assistance;Mod assist Sit to supine: Mod assist;+2 for physical assistance   General bed mobility comments: required modAx 2 to bring trunk to bed and roll onto back   Transfers Overall transfer level: Needs assistance Equipment used: None Transfers: Licensed conveyancerAnterior-Posterior Transfer       Anterior-Posterior transfers: +2 physical assistance;Total assist;Mod assist   General transfer comment: pt able to walk hips back to recliner with modAx2 for scoot over gap between bed and recliner. Pt unwilling to participate in transfer back to bed for foley placement requiring total assist of 2.  Ambulation/Gait             General Gait Details: NT      Balance Overall balance assessment: Needs assistance Sitting-balance  support: Bilateral upper extremity supported;Feet unsupported Sitting balance-Leahy Scale: Fair Sitting balance - Comments: able to maintain balance when she wanted to but increased R lateral lean  Postural control: Right lateral lean                                  Cognition Arousal/Alertness: Awake/alert Behavior During Therapy: Flat affect;Impulsive;Restless Overall Cognitive Status: No family/caregiver present to determine baseline cognitive functioning                                 General Comments: Pt with decreased attention, following simple directions inconsistently with non-stop talking about unrelated things.              Pertinent Vitals/Pain Pain Assessment: 0-10 Pain Score: 8  Pain Location: LLE and residual limb and L hip Pain Descriptors / Indicators: Constant;Discomfort;Guarding Pain Intervention(s): Monitored during session           PT Goals (current goals can now be found in the care plan section) Acute Rehab PT Goals Patient Stated Goal: Stop pain PT Goal Formulation: Patient unable to participate in goal setting Time For Goal Achievement: 12/06/17 Potential to Achieve Goals: Fair    Frequency    Min 3X/week      PT Plan Current plan remains appropriate       AM-PAC PT "6 Clicks" Daily Activity  Outcome Measure  Difficulty turning over in bed (including  adjusting bedclothes, sheets and blankets)?: Unable Difficulty moving from lying on back to sitting on the side of the bed? : Unable Difficulty sitting down on and standing up from a chair with arms (e.g., wheelchair, bedside commode, etc,.)?: Unable Help needed moving to and from a bed to chair (including a wheelchair)?: Total Help needed walking in hospital room?: Total Help needed climbing 3-5 steps with a railing? : Total 6 Click Score: 6    End of Session Equipment Utilized During Treatment: Gait belt Activity Tolerance: Patient limited by  pain Patient left: in bed;with call bell/phone within reach;with nursing/sitter in room;with bed alarm set Nurse Communication: Mobility status;Patient requests pain meds PT Visit Diagnosis: Unsteadiness on feet (R26.81);Muscle weakness (generalized) (M62.81);Pain Pain - Right/Left: Left Pain - part of body: Leg     Time: 1412-1450 and  [id number] PT Time Calculation (min) (ACUTE ONLY): 38 min and 12 min Charges:    $Therapeutic Activity: 38-52 mins                   G Codes:       Laylani Pudwill B. Beverely RisenVan Fleet PT, DPT Acute Rehabilitation  [phone] Pager [phone]     Elon Alaslizabeth B Van Fleet 12/01/2017, 5:24 PM

## 2017-12-01 NOTE — Discharge Instructions (Signed)
DRY DRESSINGBTO THE STUMP DAILY AND AS NEEDED

## 2017-12-01 NOTE — NC FL2 (Signed)
[place] MEDICAID FL2 LEVEL OF CARE SCREENING TOOL     IDENTIFICATION  Patient Name: Laurie [name] Birthdate: Oct 13, 1946 Sex: female Admission Date (Current Location): 11/24/2017  [facility] [place]   [facility] and Address[facility]  [facility] [address], [place], [place] [address]      Provider Number: [id number]  Attending Physician Name and Address:  Alwyn RenMathews, Elizabeth G, MD  Relative Name and Phone Number:  Shanda BumpsJessica, daughter, [phone]    Current Level of Care: Hospital Recommended Level of Care: Skilled Nursing Facility Prior Approval Number:    Date Approved/Denied:   PASRR Number: [id number]  Discharge Plan: SNF    Current Diagnoses: Patient Active Problem List   Diagnosis Date Noted  . Sepsis due to pneumonia vs foot ulcer 11/24/2017  . Acute kidney injury (HCC) 11/24/2017  . Diabetes mellitus, insulin dependent (IDDM), controlled (HCC) 11/24/2017  . Hypertension 11/24/2017  . Chronic diastolic heart failure, NYHA class 1 (HCC) 11/24/2017  . Type 1 diabetes mellitus with left diabetic foot ulcer (HCC) 11/24/2017  . High cholesterol 11/24/2017  . Anemia, chronic disease 11/24/2017  . PAD (peripheral artery disease) s/p right BKA 11/24/2017  . Sacral decubitus ulcer, stage II 11/24/2017  . Acute metabolic encephalopathy 11/24/2017  . Fever of unknown origin   . SOB (shortness of breath)   . Community acquired pneumonia of left lower lobe of lung (HCC)   . Sepsis (HCC) 07/23/2017  . Obesity (BMI 30-39.9) 07/23/2017  . Atherosclerosis of native arteries of the extremities with ulceration (HCC) 01/21/2017  . Pressure injury of skin 12/03/2016  . PVD (peripheral vascular disease) (HCC) 03/05/2015  . S/P BKA (below knee amputation) (HCC) 11/23/2014  . Depression 11/11/2014  . Gangrene of left foot (HCC)   . Hypokalemia   . Peripheral vascular disease (HCC) 11/02/2014  . Gangrene (HCC) 11/02/2014  . Wound,  surgical, infected 11/01/2014  . Diabetic foot ulcer (HCC) 11/01/2014  . Trochanteric bursitis of left hip 05/29/2014  . Spastic hemiplegia affecting nondominant side (HCC) 12/02/2013  . Chronic diastolic heart failure, grade 2 10/26/2013  . Dyslipidemia 10/26/2013  . CVA (cerebral infarction) 10/25/2013  . Arthralgia of hip 10/25/2013  . Anemia 05/17/2009  . Diabetes mellitus type 2 with peripheral artery disease (HCC) 05/08/2009  . Anxiety state 05/08/2009  . Essential hypertension 05/08/2009    Orientation RESPIRATION BLADDER Height & Weight     Self, Time, Situation, Place  Normal Incontinent Weight: 84.9 kg (187 lb 2.7 oz) Height:  5\' 7"  (170.2 cm)  BEHAVIORAL SYMPTOMS/MOOD NEUROLOGICAL BOWEL NUTRITION STATUS      Incontinent Diet(Please see DC Summary)  AMBULATORY STATUS COMMUNICATION OF NEEDS Skin   Extensive Assist Verbally PU Stage and Appropriate Care(Stage II on buttocks; negative pressure wound on leg with dry dressing;Closed incision on leg)                       Personal Care Assistance Level of Assistance  Bathing, Feeding, Dressing Bathing Assistance: Maximum assistance Feeding assistance: Independent Dressing Assistance: Limited assistance     Functional Limitations Info  Hearing, Sight, Speech Sight Info: Adequate Hearing Info: Adequate Speech Info: Adequate    SPECIAL CARE FACTORS FREQUENCY  PT (By licensed PT), OT (By licensed OT)     PT Frequency: 5x/week OT Frequency: 3x/week            Contractures      Additional Factors Info  Code Status, Allergies Code  Status Info: Full Allergies Info: Penicillins           Current Medications (12/01/2017):  This is the current hospital active medication list Current Facility-Administered Medications  Medication Dose Route Frequency Provider Last Rate Last Dose  . 0.9 %  sodium chloride infusion   Intravenous Continuous Nadara Mustard, MD 10 mL/hr at 11/28/17 1713    . acetaminophen  (TYLENOL) tablet 650 mg  650 mg Oral Q4H PRN Nadara Mustard, MD       Or  . acetaminophen (TYLENOL) suppository 650 mg  650 mg Rectal Q4H PRN Nadara Mustard, MD      . bisacodyl (DULCOLAX) suppository 10 mg  10 mg Rectal Daily PRN Nadara Mustard, MD      . clopidogrel (PLAVIX) tablet 75 mg  75 mg Oral Daily Russella Dar, NP   75 mg at 12/01/17 1011  . docusate sodium (COLACE) capsule 100 mg  100 mg Oral BID Nadara Mustard, MD   100 mg at 12/01/17 1011  . DULoxetine (CYMBALTA) DR capsule 60 mg  60 mg Oral Daily Russella Dar, NP   60 mg at 12/01/17 1012  . famotidine (PEPCID) tablet 20 mg  20 mg Oral Daily Junious Silk L, NP   20 mg at 12/01/17 1012  . heparin injection 5,000 Units  5,000 Units Subcutaneous Q8H Russella Dar, NP   5,000 Units at 12/01/17 0517  . hydrocortisone cream 1 % 1 application  1 application Topical TID PRN Alwyn Ren, MD   1 application at 12/01/17 [id number]  . HYDROmorphone (DILAUDID) injection 1 mg  1 mg Intravenous Q4H PRN Alwyn Ren, MD   1 mg at 12/01/17 1049  . insulin aspart (novoLOG) injection 0-15 Units  0-15 Units Subcutaneous Q4H Russella Dar, NP   2 Units at 12/01/17 1224  . ipratropium-albuterol (DUONEB) 0.5-2.5 (3) MG/3ML nebulizer solution 3 mL  3 mL Nebulization Q6H PRN Russella Dar, NP      . lactated ringers infusion   Intravenous Continuous Marcene Duos, MD      . magnesium citrate solution 1 Bottle  1 Bottle Oral Once PRN Nadara Mustard, MD      . metoCLOPramide (REGLAN) tablet 5-10 mg  5-10 mg Oral Q8H PRN Nadara Mustard, MD       Or  . metoCLOPramide (REGLAN) injection 5-10 mg  5-10 mg Intravenous Q8H PRN Nadara Mustard, MD      . metoprolol tartrate (LOPRESSOR) injection 10 mg  10 mg Intravenous Q6H Alwyn Ren, MD   10 mg at 12/01/17 1224  . ondansetron (ZOFRAN) tablet 4 mg  4 mg Oral Q6H PRN Nadara Mustard, MD       Or  . ondansetron [facility]) injection 4 mg  4 mg Intravenous Q6H PRN Nadara Mustard,  MD   4 mg at 11/28/17 2106  . oxyCODONE (Oxy IR/ROXICODONE) immediate release tablet 10 mg  10 mg Oral Q3H PRN Nadara Mustard, MD   10 mg at 11/30/17 2056  . oxyCODONE (Oxy IR/ROXICODONE) immediate release tablet 5 mg  5 mg Oral Q3H PRN Nadara Mustard, MD   5 mg at 11/30/17 1449  . polyethylene glycol (MIRALAX / GLYCOLAX) packet 17 g  17 g Oral Daily PRN Nadara Mustard, MD      . sodium chloride flush (NS) 0.9 % injection 3 mL  3 mL Intravenous Q12H Russella Dar, NP  3 mL at 12/01/17 1049     Discharge Medications: Please see discharge summary for a list of discharge medications.  Relevant Imaging Results:  Relevant Lab Results:   Additional Information SS # [id number]  Mearl LatinNadia S Ronit Cranfield, LCSWA

## 2017-12-01 NOTE — Progress Notes (Signed)
Pt due to void at 2100 s/p foley catheter. Pt unable to void and had 232 in bladder per scan. Pt asking for more time. MD paged and made aware, order for in/out cath q6 PRN. Pt had incontinent episode of urine at 2330. PVR of 33. WCTM

## 2017-12-01 NOTE — Progress Notes (Signed)
CSW presented patient's daughter with only available bed option for LTC. Patient's daughter, Laurie [name], in agreement with patient transfer to [facility].   Osborne Cascoadia Selmer Adduci LCSWA [phone]

## 2017-12-01 NOTE — Progress Notes (Signed)
Patient will DC to: [facility] Anticipated DC date: 12/01/17 Family notified: Daughter Transport by: Sharin MonsPTAR   Per MD patient ready for DC to [facility]. RN, patient, patient's family, and facility notified of DC. Discharge Summary sent to facility. RN given number for report [phone]). DC packet on chart. Ambulance transport requested for patient.   CSW signing off.  Cristobal GoldmannNadia Anijah Spohr, [place] Clinical Social Worker [phone]

## 2017-12-01 NOTE — Discharge Summary (Signed)
Physician Discharge Summary  Laurie [name] WUJ:[id number] DOB: 1946/07/20 DOA: 11/24/2017  PCP: Rometta Emery, MD  Admit date: 11/24/2017 Discharge date: 12/01/2017  Admitted From NURSING HOME :Disposition:   NURSING HOME Recommendations for Outpatient Follow-up:  1. Follow up with PCP in 1-2 weeks 2. Please obtain BMP/CBC in one week  Home Health NONE Equipment/Devices:NONE Discharge Condition STABLE :CODE STATUSFULL Diet recommendationLOW SALT Brief/Interim Summary:71 y.o.femalewith medical history significant forchronic diastolic heart failure on ACE inhibitor, Zaroxolyn and Demadex, peripheral vascular disease with prior right BKA, chronic left heel ulcer, anemia of chronic kidney disease, diabetes on insulin, dyslipidemia, hypertension and narcolepsy. She has had altered mentation since last Friday and was taken to her PCP office. Subsequently the facility has been notified of abnormal renal function labs. EMS was called to the nurse facility because of worsened mentation. Patient's blood pressure was suboptimal and she was given 300 cc normal saline by the paramedics. Upon presentation patient's initial blood pressure was 63/20 with a pulse of 111, she had a rectal temperature of 99.2. Chest x-ray revealed possible right midlung zone pneumonia. Patient had mild leukocytosis. She has a chronic left heel ulcer which bleeds easily and partially covered by necrotic base and does have somewhat of a foul odor. Left foot x-ray without fracture, dislocation or evidence of osteomyelitis. Patient's lactic acid was mildly elevated at 2.3 in the context of severe acute kidney injury with a BUN of 174 and a creatinine of 5.17.With elevated lactic acid, hypotension, acute kidney injury and possible findings of infection in the lung sepsis protocol was initiated by the EDP and she has been given a dose of broad-spectrum empiric antibiotics after blood cultures were obtained. Patient has  not made any urine despite fluid resuscitation. Bladder scan showed no urine in the bladder. Urinalysis ordered but has not yet been collected. I have informed the nurse that I will place an order for Foley catheter insertion for accurate intake and output    Discharge Diagnoses:  Principal Problem:   Sepsis due to pneumonia vs foot ulcer Active Problems:   Gangrene of left foot (HCC)   Acute kidney injury (HCC)   Diabetes mellitus, insulin dependent (IDDM), controlled (HCC)   Hypertension   Chronic diastolic heart failure, NYHA class 1 (HCC)   Type 1 diabetes mellitus with left diabetic foot ulcer (HCC)   High cholesterol   Anemia, chronic disease   PAD (peripheral artery disease) s/p right BKA   Sacral decubitus ulcer, stage II   Acute metabolic encephalopathy  S/P LEFT AKA PNEUMONIA CKD HTN DM  PlanPLAN-DC WOUND VAC.DC TO SNF TODAY.DRY DRESSING ON STUMP.  Discharge Instructions   Allergies as of 12/01/2017      Reactions   Penicillins Hives   Has patient had a PCN reaction causing immediate rash, facial/tongue/throat swelling, SOB or lightheadedness with hypotension: Yes Has patient had a PCN reaction causing severe rash involving mucus membranes or skin necrosis: No Has patient had a PCN reaction that required hospitalization: No Has patient had a PCN reaction occurring within the last 10 years: No If all of the above answers are "NO", then may proceed with Cephalosporin use. Childhood allergy could have been more reaction only rem      Medication List    STOP taking these medications   cloNIDine 0.2 MG tablet Commonly known as:  CATAPRES   HYDROcodone-acetaminophen 5-325 MG tablet Commonly known as:  NORCO/VICODIN   insulin aspart 100 UNIT/ML injection Commonly known as:  novoLOG  insulin detemir 100 UNIT/ML injection Commonly known as:  LEVEMIR   ipratropium-albuterol 0.5-2.5 (3) MG/3ML Soln Commonly known as:  DUONEB   lisinopril 20 MG  tablet Commonly known as:  PRINIVIL,ZESTRIL   metolazone 10 MG tablet Commonly known as:  ZAROXOLYN     TAKE these medications   acetaminophen 325 MG tablet Commonly known as:  TYLENOL Take 2 tablets (650 mg total) by mouth every 4 (four) hours as needed for mild pain ((score 1 to 3) or temp > 100.5).   amLODipine 5 MG tablet Commonly known as:  NORVASC Take 1 tablet (5 mg total) by mouth daily.   amphetamine-dextroamphetamine 30 MG tablet Commonly known as:  ADDERALL Take 30 mg by mouth daily with breakfast. Reported on 07/02/2016   ascorbic acid 500 MG tablet Commonly known as:  VITAMIN C Take 500 mg by mouth 3 (three) times daily.   atorvastatin 10 MG tablet Commonly known as:  LIPITOR Take 1 tablet (10 mg total) by mouth daily at 6 PM. What changed:  how much to take   CERTAGEN PO Take 1 tablet by mouth daily.   clopidogrel 75 MG tablet Commonly known as:  PLAVIX Take 75 mg by mouth daily.   diclofenac sodium 1 % Gel Commonly known as:  VOLTAREN Apply 4 g topically 4 (four) times daily.   docusate sodium 100 MG capsule Commonly known as:  COLACE Take 1 capsule (100 mg total) by mouth 2 (two) times daily.   DULoxetine 60 MG capsule Commonly known as:  CYMBALTA Take 60 mg by mouth daily.   gabapentin 300 MG capsule Commonly known as:  NEURONTIN Take 1 capsule (300 mg total) by mouth 2 (two) times daily. What changed:  when to take this   hydrocortisone cream 1 % Apply 1 application topically 3 (three) times daily as needed for itching (minor skin irritation).   Insulin Glargine 300 UNIT/ML Sopn Commonly known as:  TOUJEO SOLOSTAR Inject 15 Units into the skin at bedtime. What changed:    medication strength  how much to take   lamoTRIgine 100 MG tablet Commonly known as:  LAMICTAL Take 100 mg by mouth 2 (two) times daily.   loratadine 10 MG tablet Commonly known as:  CLARITIN Take 10 mg by mouth at bedtime.   mupirocin ointment 2 % Commonly  known as:  BACTROBAN Place 1 application into the nose 2 (two) times daily.   ondansetron 4 MG tablet Commonly known as:  ZOFRAN Take 1 tablet (4 mg total) by mouth every 6 (six) hours as needed for nausea.   oxyCODONE 5 MG immediate release tablet Commonly known as:  Oxy IR/ROXICODONE Take 1 tablet (5 mg total) by mouth every 4 (four) hours as needed for severe pain.   phenylephrine-shark liver oil-mineral oil-petrolatum 0.25-3-14-71.9 % rectal ointment Commonly known as:  PREPARATION H Place 1 application rectally 3 (three) times daily as needed for hemorrhoids.   polyethylene glycol packet Commonly known as:  MIRALAX / GLYCOLAX Take 17 g by mouth daily as needed for mild constipation.   ranitidine 150 MG tablet Commonly known as:  ZANTAC Take 150 mg by mouth at bedtime.   senna-docusate 8.6-50 MG tablet Commonly known as:  Senokot-S Take 1 tablet by mouth at bedtime as needed for mild constipation.   SYSTANE 0.4-0.3 % Soln Generic drug:  Polyethyl Glycol-Propyl Glycol Place 1 drop into both eyes 2 (two) times daily.   torsemide 100 MG tablet Commonly known as:  DEMADEX Take 0.5 tablets (  50 mg total) by mouth daily. What changed:  how much to take      Follow-up Information    Rometta EmeryGarba, Mohammad L, MD Follow up.   Specialty:  Internal Medicine Contact information: [address] [place] [address] [phone]        Nadara Mustarduda, Marcus V, MD Follow up in 1 week(s).   Specialty:  Orthopedic Surgery Contact information: [address] [place] [address] [phone]        Rometta EmeryGarba, Mohammad L, MD Follow up.   Specialty:  Internal Medicine Contact information: [address] [place] [address] [phone]          Allergies  Allergen Reactions  . Penicillins Hives    Has patient had a PCN reaction causing immediate rash, facial/tongue/throat swelling, SOB or lightheadedness with hypotension: Yes Has patient had a PCN reaction  causing severe rash involving mucus membranes or skin necrosis: No Has patient had a PCN reaction that required hospitalization: No Has patient had a PCN reaction occurring within the last 10 years: No If all of the above answers are "NO", then may proceed with Cephalosporin use.   Childhood allergy could have been more reaction only rem    Consultations:  Procedures/Studies: Dg Chest 1 View  Result Date: 11/27/2017 CLINICAL DATA:  Cough and chest pain EXAM: CHEST 1 VIEW COMPARISON:  November 24, 2017 FINDINGS: There is persistent patchy opacity in the right base. Lungs elsewhere are clear. Heart is borderline enlarged with pulmonary vascularity within normal limits. No adenopathy. No bone lesions. IMPRESSION: Persistent patchy opacity right mid lung, likely a focus of pneumonia. No new airspace opacity. Stable cardiac prominence. No adenopathy evident. Electronically Signed   By: Bretta BangWilliam  Woodruff III M.D.   On: 11/27/2017 18:59   Dg Chest 2 View  Result Date: 11/24/2017 CLINICAL DATA:  Altered mental status, abnormal kidney functions EXAM: CHEST  2 VIEW COMPARISON:  Portable chest x-ray of 07/30/2017 FINDINGS: Although the previously described airspace disease has significantly improved there is patchy opacity remaining in the periphery of the right mid lung. Continued followup is recommended. No definite pleural effusion is seen. Mediastinal and hilar contours are unremarkable and mild cardiomegaly is stable. No bony abnormality is noted. IMPRESSION: Minimal airspace disease remains in the periphery of the right mid lung possibly representing pneumonia. Recommend continued followup. Electronically Signed   By: Dwyane DeePaul  Barry M.D.   On: 11/24/2017 14:26   [place] Renal  Result Date: 11/24/2017 CLINICAL DATA:  Acute renal insufficiency. EXAM: RENAL / URINARY TRACT ULTRASOUND COMPLETE COMPARISON:  MRI 08/01/2017.  CT 08/02/2017. FINDINGS: Right Kidney: Length: 11.2 cm. Echogenicity within normal limits.  No mass or hydronephrosis visualized. 2.3 cm calcified right renal focus,, possibly slightly larger than on prior CT of 07/23/2017. Possibility of renal artery aneurysm cannot be excluded. Further evaluation with CT or MRI can be obtained. If the patient's renal function allows CTA can be performed. Left Kidney: Length: 11.1 cm. Echogenicity within normal limits. No mass or hydronephrosis. Bladder: Bladder nondistended.  Patient previously voided. IMPRESSION: 1. 2.3 cm calcified right renal focus again noted, possibly slightly larger than on prior CT of 08/02/2017. Possibly of renal artery aneurysm cannot be excluded. Other etiologies of calcified masses cannot be excluded. Further evaluation with CT or MRI can be obtained. The patient renal function line CTA can be performed. 2. No acute abnormality identified. No hydronephrosis. Bladder is nondistended. Electronically Signed   By: Maisie Fushomas  Register   On: 11/24/2017 16:17  Dg Foot Complete Left  Result Date: 11/24/2017 CLINICAL DATA:  71 year old diabetic female with open wound left foot. Initial encounter. EXAM: LEFT FOOT - COMPLETE 3+ VIEW COMPARISON:  None. FINDINGS: Ulceration inferior to plantar spur.  No adjacent osteomyelitis. Since the prior examination, change in configuration of posterior aspect of calcaneus superior to the Achilles tendon insertion site. This does not have typical configuration of osteomyelitis. No fracture or dislocation. IMPRESSION: Ulceration inferior to plantar spur.  No adjacent osteomyelitis. Since the prior examination, change in configuration of posterior aspect of calcaneus superior to the Achilles tendon insertion site. This does not have typical configuration of osteomyelitis. If further delineation were clinically desired, MR imaging may prove more sensitive. Electronically Signed   By: Lacy Duverney M.D.   On: 11/24/2017 14:25    (Echo, Carotid, EGD, Colonoscopy, ERCP)    Subjective:   Discharge Exam: Vitals:    12/01/17 1157 12/01/17 1211  BP:  (!) 134/55  Pulse:  79  Resp:  11  Temp: 98.4 F (36.9 C) 99.1 F (37.3 C)  SpO2:  97%   Vitals:   12/01/17 0802 12/01/17 0852 12/01/17 1157 12/01/17 1211  BP: (!) 123/56   (!) 134/55  Pulse: 83   79  Resp: 17   11  Temp: 98.5 F (36.9 C) 97.8 F (36.6 C) 98.4 F (36.9 C) 99.1 F (37.3 C)  TempSrc: Oral   Oral  SpO2: 99%   97%  Weight:      Height:        General: Pt is alert, awake, not in acute distress Cardiovascular: RRR, S1/S2 +, no rubs, no gallops Respiratory: CTA bilaterally, no wheezing, no rhonchi Abdominal: Soft, NT, ND, bowel sounds + Extremities: no edema, no cyanosis    The results of significant diagnostics from this hospitalization (including imaging, microbiology, ancillary and laboratory) are listed below for reference.     Microbiology: Recent Results (from the past 240 hour(s))  Blood culture (routine x 2)     Status: None   Collection Time: 11/24/17  1:33 PM  Result Value Ref Range Status   Specimen Description BLOOD LEFT ANTECUBITAL  Final   Special Requests   Final    BOTTLES DRAWN AEROBIC ONLY Blood Culture adequate volume   Culture NO GROWTH 5 DAYS  Final   Report Status 11/29/2017 FINAL  Final  Blood culture (routine x 2)     Status: None   Collection Time: 11/24/17  1:33 PM  Result Value Ref Range Status   Specimen Description BLOOD LEFT HAND  Final   Special Requests   Final    BOTTLES DRAWN AEROBIC ONLY Blood Culture adequate volume   Culture NO GROWTH 5 DAYS  Final   Report Status 11/29/2017 FINAL  Final  Urine Culture     Status: Abnormal   Collection Time: 11/24/17  5:58 PM  Result Value Ref Range Status   Specimen Description URINE, RANDOM  Final   Special Requests NONE  Final   Culture >=100,000 COLONIES/mL ESCHERICHIA COLI (A)  Final   Report Status 11/26/2017 FINAL  Final   Organism ID, Bacteria ESCHERICHIA COLI (A)  Final      Susceptibility   Escherichia coli - MIC*    AMPICILLIN  <=2 SENSITIVE Sensitive     CEFAZOLIN <=4 SENSITIVE Sensitive     CEFTRIAXONE <=1 SENSITIVE Sensitive     CIPROFLOXACIN <=0.25 SENSITIVE Sensitive     GENTAMICIN <=1 SENSITIVE Sensitive     IMIPENEM <=0.25 SENSITIVE Sensitive  NITROFURANTOIN <=16 SENSITIVE Sensitive     TRIMETH/SULFA <=20 SENSITIVE Sensitive     AMPICILLIN/SULBACTAM <=2 SENSITIVE Sensitive     PIP/TAZO <=4 SENSITIVE Sensitive     Extended ESBL NEGATIVE Sensitive     * >=100,000 COLONIES/mL ESCHERICHIA COLI  Respiratory Panel by PCR     Status: None   Collection Time: 11/25/17  5:55 PM  Result Value Ref Range Status   Adenovirus NOT DETECTED NOT DETECTED Final   Coronavirus 229E NOT DETECTED NOT DETECTED Final   Coronavirus HKU1 NOT DETECTED NOT DETECTED Final   Coronavirus NL63 NOT DETECTED NOT DETECTED Final   Coronavirus OC43 NOT DETECTED NOT DETECTED Final   Metapneumovirus NOT DETECTED NOT DETECTED Final   Rhinovirus / Enterovirus NOT DETECTED NOT DETECTED Final   Influenza A NOT DETECTED NOT DETECTED Final   Influenza B NOT DETECTED NOT DETECTED Final   Parainfluenza Virus 1 NOT DETECTED NOT DETECTED Final   Parainfluenza Virus 2 NOT DETECTED NOT DETECTED Final   Parainfluenza Virus 3 NOT DETECTED NOT DETECTED Final   Parainfluenza Virus 4 NOT DETECTED NOT DETECTED Final   Respiratory Syncytial Virus NOT DETECTED NOT DETECTED Final   Bordetella pertussis NOT DETECTED NOT DETECTED Final   Chlamydophila pneumoniae NOT DETECTED NOT DETECTED Final   Mycoplasma pneumoniae NOT DETECTED NOT DETECTED Final  MRSA PCR Screening     Status: Abnormal   Collection Time: 11/25/17  8:45 PM  Result Value Ref Range Status   MRSA by PCR POSITIVE (A) NEGATIVE Final    Comment:        The GeneXpert MRSA Assay (FDA approved for NASAL specimens only), is one component of a comprehensive MRSA colonization surveillance program. It is not intended to diagnose MRSA infection nor to guide or monitor treatment for MRSA  infections. RESULT CALLED TO, READ BACK BY AND VERIFIED WITH: J MIZE RN 0330 11/26/17 A BROWNING   Surgical pcr screen     Status: Abnormal   Collection Time: 11/28/17  1:02 AM  Result Value Ref Range Status   MRSA, PCR POSITIVE (A) NEGATIVE Final    Comment: RESULT CALLED TO, READ BACK BY AND VERIFIED WITH: L EI RN 0530 11/28/17 A BROWNING    Staphylococcus aureus POSITIVE (A) NEGATIVE Final    Comment: (NOTE) The Xpert SA Assay (FDA approved for NASAL specimens in patients 17 years of age and older), is one component of a comprehensive surveillance program. It is not intended to diagnose infection nor to guide or monitor treatment.      Labs: BNP (last 3 results) Recent Labs    07/23/17 1049  BNP 25.4   Basic Metabolic Panel: Recent Labs  Lab 11/25/17 0346 11/26/17 0718 11/27/17 0153 11/28/17 0312 11/29/17 0336  NA 141 146* 142 143 141  K 3.1* 3.5 3.0* 4.0 3.6  CL 102 106 105 106 106  CO2 20* 25 26 25 25   GLUCOSE 148* 199* 180* 119* 148*  BUN 124* 57* 28* 18 22*  CREATININE 2.62* 1.15* 0.98 0.88 1.18*  CALCIUM 8.3* 9.0 8.8* 8.6* 8.1*  MG  --  1.6*  --   --   --   PHOS  --  1.9*  --   --   --    Liver Function Tests: Recent Labs  Lab 11/25/17 0346 11/26/17 0718  AST 57*  --   ALT 26  --   ALKPHOS 79  --   BILITOT 0.7  --   PROT 7.6  --   ALBUMIN  2.8* 2.8*   No results for input(s): LIPASE, AMYLASE in the last 168 hours. No results for input(s): AMMONIA in the last 168 hours. CBC: Recent Labs  Lab 11/25/17 0346 11/26/17 0718 11/28/17 0312 11/29/17 0336  WBC 10.2 8.5 8.7 10.6*  NEUTROABS  --  7.1 5.6 7.3  HGB 10.6* 11.3* 10.9* 9.0*  HCT 32.8* 34.7* 34.9* 29.2*  MCV 90.6 91.8 92.6 93.9  PLT 323 332 349 313   Cardiac Enzymes: Recent Labs  Lab 11/24/17 1744  CKTOTAL 3,946*   BNP: Invalid input(s): POCBNP CBG: Recent Labs  Lab 12/01/17 0044 12/01/17 0430 12/01/17 0755 12/01/17 0850 12/01/17 1156  GLUCAP 114* 121* 122* 110* 133*    D-Dimer No results for input(s): DDIMER in the last 72 hours. Hgb A1c No results for input(s): HGBA1C in the last 72 hours. Lipid Profile No results for input(s): CHOL, HDL, LDLCALC, TRIG, CHOLHDL, LDLDIRECT in the last 72 hours. Thyroid function studies No results for input(s): TSH, T4TOTAL, T3FREE, THYROIDAB in the last 72 hours.  Invalid input(s): FREET3 Anemia work up No results for input(s): VITAMINB12, FOLATE, FERRITIN, TIBC, IRON, RETICCTPCT in the last 72 hours. Urinalysis    Component Value Date/Time   COLORURINE AMBER (A) 11/24/2017 1744   APPEARANCEUR TURBID (A) 11/24/2017 1744   LABSPEC 1.008 11/24/2017 1744   PHURINE 6.0 11/24/2017 1744   GLUCOSEU NEGATIVE 11/24/2017 1744   HGBUR MODERATE (A) 11/24/2017 1744   BILIRUBINUR NEGATIVE 11/24/2017 1744   KETONESUR NEGATIVE 11/24/2017 1744   PROTEINUR 100 (A) 11/24/2017 1744   UROBILINOGEN 1.0 04/30/2009 1200   NITRITE NEGATIVE 11/24/2017 1744   LEUKOCYTESUR LARGE (A) 11/24/2017 1744   Sepsis Labs Invalid input(s): PROCALCITONIN,  WBC,  LACTICIDVEN Microbiology Recent Results (from the past 240 hour(s))  Blood culture (routine x 2)     Status: None   Collection Time: 11/24/17  1:33 PM  Result Value Ref Range Status   Specimen Description BLOOD LEFT ANTECUBITAL  Final   Special Requests   Final    BOTTLES DRAWN AEROBIC ONLY Blood Culture adequate volume   Culture NO GROWTH 5 DAYS  Final   Report Status 11/29/2017 FINAL  Final  Blood culture (routine x 2)     Status: None   Collection Time: 11/24/17  1:33 PM  Result Value Ref Range Status   Specimen Description BLOOD LEFT HAND  Final   Special Requests   Final    BOTTLES DRAWN AEROBIC ONLY Blood Culture adequate volume   Culture NO GROWTH 5 DAYS  Final   Report Status 11/29/2017 FINAL  Final  Urine Culture     Status: Abnormal   Collection Time: 11/24/17  5:58 PM  Result Value Ref Range Status   Specimen Description URINE, RANDOM  Final   Special Requests  NONE  Final   Culture >=100,000 COLONIES/mL ESCHERICHIA COLI (A)  Final   Report Status 11/26/2017 FINAL  Final   Organism ID, Bacteria ESCHERICHIA COLI (A)  Final      Susceptibility   Escherichia coli - MIC*    AMPICILLIN <=2 SENSITIVE Sensitive     CEFAZOLIN <=4 SENSITIVE Sensitive     CEFTRIAXONE <=1 SENSITIVE Sensitive     CIPROFLOXACIN <=0.25 SENSITIVE Sensitive     GENTAMICIN <=1 SENSITIVE Sensitive     IMIPENEM <=0.25 SENSITIVE Sensitive     NITROFURANTOIN <=16 SENSITIVE Sensitive     TRIMETH/SULFA <=20 SENSITIVE Sensitive     AMPICILLIN/SULBACTAM <=2 SENSITIVE Sensitive     PIP/TAZO <=4 SENSITIVE Sensitive  Extended ESBL NEGATIVE Sensitive     * >=100,000 COLONIES/mL ESCHERICHIA COLI  Respiratory Panel by PCR     Status: None   Collection Time: 11/25/17  5:55 PM  Result Value Ref Range Status   Adenovirus NOT DETECTED NOT DETECTED Final   Coronavirus 229E NOT DETECTED NOT DETECTED Final   Coronavirus HKU1 NOT DETECTED NOT DETECTED Final   Coronavirus NL63 NOT DETECTED NOT DETECTED Final   Coronavirus OC43 NOT DETECTED NOT DETECTED Final   Metapneumovirus NOT DETECTED NOT DETECTED Final   Rhinovirus / Enterovirus NOT DETECTED NOT DETECTED Final   Influenza A NOT DETECTED NOT DETECTED Final   Influenza B NOT DETECTED NOT DETECTED Final   Parainfluenza Virus 1 NOT DETECTED NOT DETECTED Final   Parainfluenza Virus 2 NOT DETECTED NOT DETECTED Final   Parainfluenza Virus 3 NOT DETECTED NOT DETECTED Final   Parainfluenza Virus 4 NOT DETECTED NOT DETECTED Final   Respiratory Syncytial Virus NOT DETECTED NOT DETECTED Final   Bordetella pertussis NOT DETECTED NOT DETECTED Final   Chlamydophila pneumoniae NOT DETECTED NOT DETECTED Final   Mycoplasma pneumoniae NOT DETECTED NOT DETECTED Final  MRSA PCR Screening     Status: Abnormal   Collection Time: 11/25/17  8:45 PM  Result Value Ref Range Status   MRSA by PCR POSITIVE (A) NEGATIVE Final    Comment:        The GeneXpert  MRSA Assay (FDA approved for NASAL specimens only), is one component of a comprehensive MRSA colonization surveillance program. It is not intended to diagnose MRSA infection nor to guide or monitor treatment for MRSA infections. RESULT CALLED TO, READ BACK BY AND VERIFIED WITH: J MIZE RN 0330 11/26/17 A BROWNING   Surgical pcr screen     Status: Abnormal   Collection Time: 11/28/17  1:02 AM  Result Value Ref Range Status   MRSA, PCR POSITIVE (A) NEGATIVE Final    Comment: RESULT CALLED TO, READ BACK BY AND VERIFIED WITH: L EI RN 0530 11/28/17 A BROWNING    Staphylococcus aureus POSITIVE (A) NEGATIVE Final    Comment: (NOTE) The Xpert SA Assay (FDA approved for NASAL specimens in patients 43 years of age and older), is one component of a comprehensive surveillance program. It is not intended to diagnose infection nor to guide or monitor treatment.      Time coordinating discharge: Over 30 minutes  SIGNED:   Alwyn Ren, MD  [facility] 12/01/2017, 3:17 PM Pager   If 7PM-7AM, please contact night-coverage [email or web address] Password [email or web address]

## 2017-12-02 NOTE — Progress Notes (Signed)
Patient discharged via stretcher by EMT. Patient alert/oriented. Given oxy, tylenol for pain and scheduled heparin.

## 2017-12-08 ENCOUNTER — Encounter (INDEPENDENT_AMBULATORY_CARE_PROVIDER_SITE_OTHER): Payer: Self-pay | Admitting: Orthopedic Surgery

## 2017-12-08 ENCOUNTER — Ambulatory Visit (INDEPENDENT_AMBULATORY_CARE_PROVIDER_SITE_OTHER): Payer: Medicare Other | Admitting: Orthopedic Surgery

## 2017-12-08 VITALS — Ht 67.0 in | Wt 187.0 lb

## 2017-12-08 DIAGNOSIS — Z89612 Acquired absence of left leg above knee: Secondary | ICD-10-CM

## 2017-12-08 NOTE — Progress Notes (Signed)
Office Visit Note   Patient: Laurie [name]           Date of Birth: Nov 15, 1946           MRN: [id number] Visit Date: 12/08/2017              Requested by: Rometta Emery, MD [address], [place] [address] PCP: Rometta Emery, MD  Chief Complaint  Patient presents with  . Left Leg - Routine Post Op    12/8/18left AKA with vac      HPI: Patient presents in follow-up she is 1 week status post left above-the-knee amputation she is also status post right transtibial amputation.  Assessment & Plan: Visit Diagnoses:  1. S/P AKA (above knee amputation), left (HCC)     Plan: We will have her start dressing changes daily follow-up in 2 weeks to harvest the sutures will start her on a stump shrinker at that time.  Follow-Up Instructions: Return in about 2 weeks (around 12/22/2017).   Ortho Exam  Patient is alert, oriented, no adenopathy, well-dressed, normal affect, normal respiratory effort. Examination the wound is well approximated there is swelling there is no cellulitis there is no drainage no dehiscence no signs of infection.  Imaging: No results found. No images are attached to the encounter.  Labs: Lab Results  Component Value Date   HGBA1C 9.5 (H) 11/24/2017   HGBA1C 11.5 (H) 07/23/2017   HGBA1C 14.5 (H) 12/03/2016   ESRSEDRATE >140 (H) 11/24/2017   ESRSEDRATE 139 (H) 07/29/2017   ESRSEDRATE 75 (H) 12/03/2016   CRP 21.7 (H) 07/29/2017   CRP 35.3 (H) 11/01/2014   REPTSTATUS 11/26/2017 FINAL 11/24/2017   GRAMSTAIN  12/05/2016    NO WBC SEEN ABUNDANT GRAM POSITIVE COCCI IN PAIRS ABUNDANT GRAM NEGATIVE RODS Performed at [facility]    CULT >=100,000 COLONIES/mL ESCHERICHIA COLI (A) 11/24/2017   LABORGA ESCHERICHIA COLI (A) 11/24/2017    @LABSALLVALUES (HGBA1)@  Body mass index is 29.29 kg/m.  Orders:  No orders of the defined types were placed in this encounter.  No orders of the defined types were placed in this encounter.    Procedures: No procedures performed  Clinical Data: No additional findings.  ROS:  All other systems negative, except as noted in the HPI. Review of Systems  Objective: Vital Signs: Ht 5\' 7"  (1.702 m)   Wt 187 lb (84.8 kg)   BMI 29.29 kg/m   Specialty Comments:  No specialty comments available.  PMFS History: Patient Active Problem List   Diagnosis Date Noted  . Sepsis due to pneumonia vs foot ulcer 11/24/2017  . Acute kidney injury (HCC) 11/24/2017  . Diabetes mellitus, insulin dependent (IDDM), controlled (HCC) 11/24/2017  . Hypertension 11/24/2017  . Chronic diastolic heart failure, NYHA class 1 (HCC) 11/24/2017  . Type 1 diabetes mellitus with left diabetic foot ulcer (HCC) 11/24/2017  . High cholesterol 11/24/2017  . Anemia, chronic disease 11/24/2017  . PAD (peripheral artery disease) s/p right BKA 11/24/2017  . Sacral decubitus ulcer, stage II 11/24/2017  . Acute metabolic encephalopathy 11/24/2017  . Fever of unknown origin   . SOB (shortness of breath)   . Community acquired pneumonia of left lower lobe of lung (HCC)   . Sepsis (HCC) 07/23/2017  . Obesity (BMI 30-39.9) 07/23/2017  . Atherosclerosis of native arteries of the extremities with ulceration (HCC) 01/21/2017  . Pressure injury of skin 12/03/2016  . PVD (peripheral vascular disease) (HCC) 03/05/2015  . S/P BKA (below  knee amputation) (HCC) 11/23/2014  . Depression 11/11/2014  . Gangrene of left foot (HCC)   . Hypokalemia   . Peripheral vascular disease (HCC) 11/02/2014  . Gangrene (HCC) 11/02/2014  . Wound, surgical, infected 11/01/2014  . Diabetic foot ulcer (HCC) 11/01/2014  . Trochanteric bursitis of left hip 05/29/2014  . Spastic hemiplegia affecting nondominant side (HCC) 12/02/2013  . Chronic diastolic heart failure, grade 2 10/26/2013  . Dyslipidemia 10/26/2013  . CVA (cerebral infarction) 10/25/2013  . Arthralgia of hip 10/25/2013  . Anemia 05/17/2009  . Diabetes mellitus type 2 with  peripheral artery disease (HCC) 05/08/2009  . Anxiety state 05/08/2009  . Essential hypertension 05/08/2009   Past Medical History:  Diagnosis Date  . Anemia   . Anxiety   . Bacteremia   . Bursitis    "left hip; sometimes left shoulder" (03/05/2015)  . Chest pain    Troponins up to 0.12 while hospitalized for E.coli PNA  . Depression   . Family history of adverse reaction to anesthesia    "brother died 06/24/2013; woke up w/headache, vomited blood; drilled holes in his head; bleeding stroke; never came back" (03/05/2015)  . Headache    "when someone gets on my nerves" (03/05/2015)  . High cholesterol   . Hypertension   . Narcolepsy   . 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 Stroke [facility](HCC) 10/2014   "weaker on left side since" (03/05/2015)  . Type II diabetes mellitus (HCC)   . Walking pneumonia 04/2009   "double" /notes 04/23/2011    Family History  Problem Relation Age of Onset  . CAD Mother   . CAD Father     Past Surgical History:  Procedure Laterality Date  . ABDOMINAL AORTAGRAM N/A 03/05/2015   Procedure: ABDOMINAL Ronny FlurryAORTAGRAM;  Surgeon: Chuck Hinthristopher S Dickson, MD;  [facility];  Service: Cardiovascular;  Laterality: N/A;  . AMPUTATION Right 11/03/2014   Procedure: AMPUTATION BELOW KNEE;  Surgeon: Sherren Kernsharles E Fields, MD;  [facility];  Service: Vascular;  Laterality: Right;  . AMPUTATION Left 11/28/2017   Procedure: AMPUTATION ABOVE KNEE;  Surgeon: Nadara Mustarduda, Petina Muraski V, MD;  [facility];  Service: Orthopedics;  Laterality: Left;  . APPLICATION OF WOUND VAC Left 11/28/2017   Procedure: APPLICATION OF WOUND VAC;  Surgeon: Nadara Mustarduda, Kerney Hopfensperger V, MD;  [facility] OR;  Service: Orthopedics;  Laterality: Left;  . FEMORAL-TIBIAL BYPASS GRAFT Left 03/06/2015   Procedure: BYPASS GRAFT Left FEMORAL-POSTERIOR TIBIAL ARTERY with reversed saphenous vein;  Surgeon: Sherren Kernsharles E Fields, MD;  [facility];  Service: Vascular;  Laterality: Left;  . INTRAOPERATIVE ARTERIOGRAM Left  03/06/2015   Procedure: INTRA OPERATIVE ARTERIOGRAM;  Surgeon: Sherren Kernsharles E Fields, MD;  [facility];  Service: Vascular;  Laterality: Left;  . IR FLUORO GUIDE CV MIDLINE PICC RIGHT  07/29/2017  . IR US GUIDE VASC ACCESS RIGHT  07/29/2017  . IRRIGATION AND DEBRIDEMENT FOOT Left 12/05/2016   Procedure: IRRIGATION AND DEBRIDEMENT FOOT;  Surgeon: Gwyneth RevelsJustin Fowler, DPM;  Location: [facility] ORS;  Service: Podiatry;  Laterality: Left;  . LOWER EXTREMITY ANGIOGRAM N/A 10/03/2014   Procedure: LOWER EXTREMITY ANGIOGRAM;  Surgeon: Pamella PertJagadeesh R Ganji, MD;  [facility];  Service: Cardiovascular;  Laterality: N/A;  . PERIPHERAL VASCULAR CATHETERIZATION Left 12/03/2016   Procedure: Lower Extremity Angiography;  Surgeon: Annice NeedyJason S Dew, MD;  Location: [facility] INVASIVE CV LAB;  Service: Cardiovascular;  Laterality: Left;   Social History   Occupational History  . Not  on file  Tobacco Use  . Smoking status: Former Smoker    Packs/day: 0.10    Years: 40.00    Pack years: 4.00    Types: Cigarettes    Last attempt to quit: 11/21/2016    Years since quitting: 1.0  . Smokeless tobacco: Never Used  Substance and Sexual Activity  . Alcohol use: Yes    Alcohol/week: 0.0 oz    Comment: 03/05/2015 "Might have a drink on holidays"  . Drug use: No  . Sexual activity: Not Currently

## 2017-12-23 ENCOUNTER — Ambulatory Visit (INDEPENDENT_AMBULATORY_CARE_PROVIDER_SITE_OTHER): Payer: Medicare Other | Admitting: Orthopedic Surgery

## 2017-12-23 ENCOUNTER — Encounter (INDEPENDENT_AMBULATORY_CARE_PROVIDER_SITE_OTHER): Payer: Self-pay | Admitting: Orthopedic Surgery

## 2017-12-23 DIAGNOSIS — IMO0002 Reserved for concepts with insufficient information to code with codable children: Secondary | ICD-10-CM

## 2017-12-23 DIAGNOSIS — Z89612 Acquired absence of left leg above knee: Secondary | ICD-10-CM

## 2017-12-23 MED ORDER — OXYCODONE-ACETAMINOPHEN 5-325 MG PO TABS: 1.0000 | ORAL_TABLET | Freq: Four times a day (QID) | ORAL | 0 refills | Status: AC | PRN

## 2017-12-23 NOTE — Progress Notes (Signed)
Post-Op Visit Note   Patient: Laurie [name]           Date of Birth: July 29, 1946           MRN: [id number] Visit Date: 12/23/2017 PCP: Rometta EmeryGarba, Mohammad L, MD  Chief Complaint:  Chief Complaint  Patient presents with  . Left Leg - Routine Post Op    11/28/17 L AKA    HPI:  HPI  Patient is a 72 year old woman who presents today 3 weeks status post left above-the-knee amputation.  She is in a wheelchair for mobility does have a below the knee amputation on the right as well.  Ortho Exam Incision approximated with sutures and staples this is healing well there are no areas of dehiscence no drainage no erythema moderate swelling.  No sign of infection  Visit Diagnoses: No diagnosis found.  Plan: Sutures and staples harvested today.  Begin wearing a shrinker daily.  Follow-up in the office in 3 more weeks.  Follow-Up Instructions: No Follow-up on file.   Imaging: No results found.  Orders:  No orders of the defined types were placed in this encounter.  No orders of the defined types were placed in this encounter.    PMFS History: Patient Active Problem List   Diagnosis Date Noted  . Sepsis due to pneumonia vs foot ulcer 11/24/2017  . Acute kidney injury (HCC) 11/24/2017  . Diabetes mellitus, insulin dependent (IDDM), controlled (HCC) 11/24/2017  . Hypertension 11/24/2017  . Chronic diastolic heart failure, NYHA class 1 (HCC) 11/24/2017  . Type 1 diabetes mellitus with left diabetic foot ulcer (HCC) 11/24/2017  . High cholesterol 11/24/2017  . Anemia, chronic disease 11/24/2017  . PAD (peripheral artery disease) s/p right BKA 11/24/2017  . Sacral decubitus ulcer, stage II 11/24/2017  . Acute metabolic encephalopathy 11/24/2017  . Fever of unknown origin   . SOB (shortness of breath)   . Community acquired pneumonia of left lower lobe of lung (HCC)   . Sepsis (HCC) 07/23/2017  . Obesity (BMI 30-39.9) 07/23/2017  . Atherosclerosis of native arteries of the extremities  with ulceration (HCC) 01/21/2017  . Pressure injury of skin 12/03/2016  . PVD (peripheral vascular disease) (HCC) 03/05/2015  . S/P BKA (below knee amputation) (HCC) 11/23/2014  . Depression 11/11/2014  . Gangrene of left foot (HCC)   . Hypokalemia   . Peripheral vascular disease (HCC) 11/02/2014  . Gangrene (HCC) 11/02/2014  . Wound, surgical, infected 11/01/2014  . Diabetic foot ulcer (HCC) 11/01/2014  . Trochanteric bursitis of left hip 05/29/2014  . Spastic hemiplegia affecting nondominant side (HCC) 12/02/2013  . Chronic diastolic heart failure, grade 2 10/26/2013  . Dyslipidemia 10/26/2013  . CVA (cerebral infarction) 10/25/2013  . Arthralgia of hip 10/25/2013  . Anemia 05/17/2009  . Diabetes mellitus type 2 with peripheral artery disease (HCC) 05/08/2009  . Anxiety state 05/08/2009  . Essential hypertension 05/08/2009   Past Medical History:  Diagnosis Date  . Anemia   . Anxiety   . Bacteremia   . Bursitis    "left hip; sometimes left shoulder" (03/05/2015)  . Chest pain    Troponins up to 0.12 while hospitalized for E.coli PNA  . Depression   . Family history of adverse reaction to anesthesia    "brother died 06/24/2013; woke up w/headache, vomited blood; drilled holes in his head; bleeding stroke; never came back" (03/05/2015)  . Headache    "when someone gets on my nerves" (03/05/2015)  . High cholesterol   . Hypertension   .  Narcolepsy   . 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 Stroke [facility] 10/2014   "weaker on left side since" (03/05/2015)  . Type II diabetes mellitus (HCC)   . Walking pneumonia 04/2009   "double" /notes 04/23/2011    Family History  Problem Relation Age of Onset  . CAD Mother   . CAD Father     Past Surgical History:  Procedure Laterality Date  . ABDOMINAL AORTAGRAM N/A 03/05/2015   Procedure: ABDOMINAL Ronny Flurry;  Surgeon: Chuck Hint, MD;  [facility] LAB;  Service: Cardiovascular;  Laterality: N/A;  .  AMPUTATION Right 11/03/2014   Procedure: AMPUTATION BELOW KNEE;  Surgeon: Sherren Kerns, MD;  [facility];  Service: Vascular;  Laterality: Right;  . AMPUTATION Left 11/28/2017   Procedure: AMPUTATION ABOVE KNEE;  Surgeon: Nadara Mustard, MD;  [facility] OR;  Service: Orthopedics;  Laterality: Left;  . APPLICATION OF WOUND VAC Left 11/28/2017   Procedure: APPLICATION OF WOUND VAC;  Surgeon: Nadara Mustard, MD;  [facility] OR;  Service: Orthopedics;  Laterality: Left;  . FEMORAL-TIBIAL BYPASS GRAFT Left 03/06/2015   Procedure: BYPASS GRAFT Left FEMORAL-POSTERIOR TIBIAL ARTERY with reversed saphenous vein;  Surgeon: Sherren Kerns, MD;  Location: [facility] OR;  Service: Vascular;  Laterality: Left;  . INTRAOPERATIVE ARTERIOGRAM Left 03/06/2015   Procedure: INTRA OPERATIVE ARTERIOGRAM;  Surgeon: Sherren Kerns, MD;  Location[facility] OR;  Service: Vascular;  Laterality: Left;  . IR FLUORO GUIDE CV MIDLINE PICC RIGHT  07/29/2017  . IR US GUIDE VASC ACCESS RIGHT  07/29/2017  . IRRIGATION AND DEBRIDEMENT FOOT Left 12/05/2016   Procedure: IRRIGATION AND DEBRIDEMENT FOOT;  Surgeon: Gwyneth Revels, DPM;  Location: [facility] ORS;  Service: Podiatry;  Laterality: Left;  . LOWER EXTREMITY ANGIOGRAM N/A 10/03/2014   Procedure: LOWER EXTREMITY ANGIOGRAM;  Surgeon: Pamella Pert, MD;  Location: [facility] CATH LAB;  Service: Cardiovascular;  Laterality: N/A;  . PERIPHERAL VASCULAR CATHETERIZATION Left 12/03/2016   Procedure: Lower Extremity Angiography;  Surgeon: Annice Needy, MD;  Location: [facility] INVASIVE CV LAB;  Service: Cardiovascular;  Laterality: Left;   Social History   Occupational History  . Not on file  Tobacco Use  . Smoking status: Former Smoker    Packs/day: 0.10    Years: 40.00    Pack years: 4.00    Types: Cigarettes    Last attempt to quit: 11/21/2016    Years since quitting: 1.0  . Smokeless tobacco: Never Used  Substance and Sexual Activity  . Alcohol use: Yes    Alcohol/week: 0.0 oz    Comment:  03/05/2015 "Might have a drink on holidays"  . Drug use: No  . Sexual activity: Not Currently

## 2017-12-23 NOTE — Addendum Note (Signed)
Addended by: Barnie DelZAMORA, Berkleigh Beckles R on: 12/23/2017 02:36 PM   Modules accepted: Orders

## 2018-01-14 ENCOUNTER — Ambulatory Visit (INDEPENDENT_AMBULATORY_CARE_PROVIDER_SITE_OTHER): Payer: Medicare Other | Admitting: Orthopedic Surgery

## 2018-01-14 ENCOUNTER — Encounter (INDEPENDENT_AMBULATORY_CARE_PROVIDER_SITE_OTHER): Payer: Self-pay | Admitting: Orthopedic Surgery

## 2018-01-14 DIAGNOSIS — Z89612 Acquired absence of left leg above knee: Secondary | ICD-10-CM

## 2018-01-14 NOTE — Progress Notes (Signed)
Post-Op Visit Note   Patient: Laurie [name]           Date of Birth: 1946/03/01           MRN: [id number] Visit Date: 01/14/2018 PCP: Rometta Emery, MD  Chief Complaint:  Chief Complaint  Patient presents with  . Left Knee - Pain, Routine Post Op, Follow-up    HPI:  HPI  Patient is a 72 year old woman who presents today 3 weeks status post left above-the-knee amputation.  She is in a wheelchair for mobility does have a below the knee amputation on the right as well. Complains of retained staples laterally.  Ortho Exam Incision well healed. Three retained staples laterally and one suture. Harvested these today. no drainage no erythema moderate swelling.  No sign of infection  Visit Diagnoses:  1. History of left above knee amputation (HCC)     Plan: Sutures and staples harvested today.  Call [employer] to obtain and begin wearing a shrinker daily.  Follow-up in the office in 3 months.  Follow-Up Instructions: Return in about 3 months (around 04/14/2018).   Imaging: No results found.  Orders:  No orders of the defined types were placed in this encounter.  No orders of the defined types were placed in this encounter.    PMFS History: Patient Active Problem List   Diagnosis Date Noted  . History of left above knee amputation (HCC) 01/14/2018  . Sepsis due to pneumonia vs foot ulcer 11/24/2017  . Acute kidney injury (HCC) 11/24/2017  . Diabetes mellitus, insulin dependent (IDDM), controlled (HCC) 11/24/2017  . Hypertension 11/24/2017  . Chronic diastolic heart failure, NYHA class 1 (HCC) 11/24/2017  . Type 1 diabetes mellitus with left diabetic foot ulcer (HCC) 11/24/2017  . High cholesterol 11/24/2017  . Anemia, chronic disease 11/24/2017  . PAD (peripheral artery disease) s/p right BKA 11/24/2017  . Sacral decubitus ulcer, stage II 11/24/2017  . Acute metabolic encephalopathy 11/24/2017  . Fever of unknown origin   . SOB (shortness of breath)   . Community  acquired pneumonia of left lower lobe of lung (HCC)   . Sepsis (HCC) 07/23/2017  . Obesity (BMI 30-39.9) 07/23/2017  . Atherosclerosis of native arteries of the extremities with ulceration (HCC) 01/21/2017  . Pressure injury of skin 12/03/2016  . PVD (peripheral vascular disease) (HCC) 03/05/2015  . S/P BKA (below knee amputation) (HCC) 11/23/2014  . Depression 11/11/2014  . Hypokalemia   . Peripheral vascular disease (HCC) 11/02/2014  . Wound, surgical, infected 11/01/2014  . Diabetic foot ulcer (HCC) 11/01/2014  . Trochanteric bursitis of left hip 05/29/2014  . Spastic hemiplegia affecting nondominant side (HCC) 12/02/2013  . Chronic diastolic heart failure, grade 2 10/26/2013  . Dyslipidemia 10/26/2013  . CVA (cerebral infarction) 10/25/2013  . Arthralgia of hip 10/25/2013  . Anemia 05/17/2009  . Diabetes mellitus type 2 with peripheral artery disease (HCC) 05/08/2009  . Anxiety state 05/08/2009  . Essential hypertension 05/08/2009   Past Medical History:  Diagnosis Date  . Anemia   . Anxiety   . Bacteremia   . Bursitis    "left hip; sometimes left shoulder" (03/05/2015)  . Chest pain    Troponins up to 0.12 while hospitalized for E.coli PNA  . Depression   . Family history of adverse reaction to anesthesia    "brother died 24-Jul-2013; woke up w/headache, vomited blood; drilled holes in his head; bleeding stroke; never came back" (03/05/2015)  . Gangrene of left foot (HCC)   .  Headache    "when someone gets on my nerves" (03/05/2015)  . High cholesterol   . Hypertension   . Narcolepsy   . 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 Stroke [facility](HCC) 10/2014   "weaker on left side since" (03/05/2015)  . Type II diabetes mellitus (HCC)   . Walking pneumonia 04/2009   "double" /notes 04/23/2011    Family History  Problem Relation Age of Onset  . CAD Mother   . CAD Father     Past Surgical History:  Procedure Laterality Date  . ABDOMINAL AORTAGRAM N/A 03/05/2015    Procedure: ABDOMINAL Ronny FlurryAORTAGRAM;  Surgeon: Chuck Hinthristopher S Dickson, MD;  [facility];  Service: Cardiovascular;  Laterality: N/A;  . AMPUTATION Right 11/03/2014   Procedure: AMPUTATION BELOW KNEE;  Surgeon: Sherren Kernsharles E Fields, MD;  [facility];  Service: Vascular;  Laterality: Right;  . AMPUTATION Left 11/28/2017   Procedure: AMPUTATION ABOVE KNEE;  Surgeon: Nadara Mustarduda, Marcus V, MD;  [facility];  Service: Orthopedics;  Laterality: Left;  . APPLICATION OF WOUND VAC Left 11/28/2017   Procedure: APPLICATION OF WOUND VAC;  Surgeon: Nadara Mustarduda, Marcus V, MD;  [facility] OR;  Service: Orthopedics;  Laterality: Left;  . FEMORAL-TIBIAL BYPASS GRAFT Left 03/06/2015   Procedure: BYPASS GRAFT Left FEMORAL-POSTERIOR TIBIAL ARTERY with reversed saphenous vein;  Surgeon: Sherren Kernsharles E Fields, MD;  [facility];  Service: Vascular;  Laterality: Left;  . INTRAOPERATIVE ARTERIOGRAM Left 03/06/2015   Procedure: INTRA OPERATIVE ARTERIOGRAM;  Surgeon: Sherren Kernsharles E Fields, MD;  [facility];  Service: Vascular;  Laterality: Left;  . IR FLUORO GUIDE CV MIDLINE PICC RIGHT  07/29/2017  . IR US GUIDE VASC ACCESS RIGHT  07/29/2017  . IRRIGATION AND DEBRIDEMENT FOOT Left 12/05/2016   Procedure: IRRIGATION AND DEBRIDEMENT FOOT;  Surgeon: Gwyneth RevelsJustin Fowler, DPM;  Location: [facility] ORS;  Service: Podiatry;  Laterality: Left;  . LOWER EXTREMITY ANGIOGRAM N/A 10/03/2014   Procedure: LOWER EXTREMITY ANGIOGRAM;  Surgeon: Pamella PertJagadeesh R Ganji, MD;  [facility];  Service: Cardiovascular;  Laterality: N/A;  . PERIPHERAL VASCULAR CATHETERIZATION Left 12/03/2016   Procedure: Lower Extremity Angiography;  Surgeon: Annice NeedyJason S Dew, MD;  Location: [facility] INVASIVE CV LAB;  Service: Cardiovascular;  Laterality: Left;   Social History   Occupational History  . Not on file  Tobacco Use  . Smoking status: Former Smoker    Packs/day: 0.10    Years: 40.00    Pack years: 4.00    Types: Cigarettes    Last attempt to quit: 11/21/2016    Years  since quitting: 1.1  . Smokeless tobacco: Never Used  Substance and Sexual Activity  . Alcohol use: Yes    Alcohol/week: 0.0 oz    Comment: 03/05/2015 "Might have a drink on holidays"  . Drug use: No  . Sexual activity: Not Currently

## 2018-03-15 ENCOUNTER — Ambulatory Visit (INDEPENDENT_AMBULATORY_CARE_PROVIDER_SITE_OTHER): Payer: Medicare Other | Admitting: Orthopedic Surgery

## 2018-03-22 ENCOUNTER — Encounter (INDEPENDENT_AMBULATORY_CARE_PROVIDER_SITE_OTHER): Payer: Self-pay | Admitting: Orthopedic Surgery

## 2018-03-22 ENCOUNTER — Ambulatory Visit (INDEPENDENT_AMBULATORY_CARE_PROVIDER_SITE_OTHER): Payer: Medicare Other | Admitting: Orthopedic Surgery

## 2018-03-22 VITALS — Ht 67.0 in | Wt 187.0 lb

## 2018-03-22 DIAGNOSIS — Z89612 Acquired absence of left leg above knee: Secondary | ICD-10-CM

## 2018-03-22 NOTE — Progress Notes (Signed)
Office Visit Note   Patient: Laurie [name]           Date of Birth: February 08, 1946           MRN: [id number] Visit Date: 03/22/2018              Requested by: Rometta EmeryGarba, Mohammad L, MD [address] [place], [place] [id number] PCP: Rometta EmeryGarba, Mohammad L, MD  Chief Complaint  Patient presents with  . Left Leg - Follow-up    11/28/17 left AKA      HPI: Patient presents in follow-up she is about 4 months status post a left above-knee amputation.  She has not obtained her prosthetic shrinker she states the facility lost her prescription.  Patient complains of swelling in the residual wound.  Assessment & Plan: Visit Diagnoses:  1. History of left above knee amputation (HCC)     Plan: Patient is given a prescription for a biotech for a stump shrinker for the left above-knee amputation and a K2 level prosthesis with materials and supplies will follow-up in 4 weeks.  Follow-Up Instructions: Return in about 1 month (around 04/19/2018).   Ortho Exam  Patient is alert, oriented, no adenopathy, well-dressed, normal affect, normal respiratory effort. Examination patient does have increased swelling of the left above-knee amputation is well-healed she does have swelling there is no cellulitis no dermatitis no signs of infection.  Imaging: No results found. No images are attached to the encounter.  Labs: Lab Results  Component Value Date   HGBA1C 9.5 (H) 11/24/2017   HGBA1C 11.5 (H) 07/23/2017   HGBA1C 14.5 (H) 12/03/2016   ESRSEDRATE >140 (H) 11/24/2017   ESRSEDRATE 139 (H) 07/29/2017   ESRSEDRATE 75 (H) 12/03/2016   CRP 21.7 (H) 07/29/2017   CRP 35.3 (H) 11/01/2014   REPTSTATUS 11/26/2017 FINAL 11/24/2017   GRAMSTAIN  12/05/2016    NO WBC SEEN ABUNDANT GRAM POSITIVE COCCI IN PAIRS ABUNDANT GRAM NEGATIVE RODS [facility]    CULT >=100,000 COLONIES/mL ESCHERICHIA COLI (A) 11/24/2017   LABORGA ESCHERICHIA COLI (A) 11/24/2017     @LABSALLVALUES (HGBA1)@  Body mass index is 29.29 kg/m.  Orders:  No orders of the defined types were placed in this encounter.  No orders of the defined types were placed in this encounter.    Procedures: No procedures performed  Clinical Data: No additional findings.  ROS:  All other systems negative, except as noted in the HPI. Review of Systems  Objective: Vital Signs: Ht 5\' 7"  (1.702 m)   Wt 187 lb (84.8 kg)   BMI 29.29 kg/m   Specialty Comments:  No specialty comments available.  PMFS History: Patient Active Problem List   Diagnosis Date Noted  . History of left above knee amputation (HCC) 01/14/2018  . Sepsis due to pneumonia vs foot ulcer 11/24/2017  . Acute kidney injury (HCC) 11/24/2017  . Diabetes mellitus, insulin dependent (IDDM), controlled (HCC) 11/24/2017  . Hypertension 11/24/2017  . Chronic diastolic heart failure, NYHA class 1 (HCC) 11/24/2017  . Type 1 diabetes mellitus with left diabetic foot ulcer (HCC) 11/24/2017  . High cholesterol 11/24/2017  . Anemia, chronic disease 11/24/2017  . PAD (peripheral artery disease) s/p right BKA 11/24/2017  . Sacral decubitus ulcer, stage II 11/24/2017  . Acute metabolic encephalopathy 11/24/2017  . Fever of unknown origin   . SOB (shortness of breath)   . Community acquired pneumonia of left lower lobe of lung (HCC)   . Sepsis (HCC) 07/23/2017  .  Obesity (BMI 30-39.9) 07/23/2017  . Atherosclerosis of native arteries of the extremities with ulceration (HCC) 01/21/2017  . Pressure injury of skin 12/03/2016  . PVD (peripheral vascular disease) (HCC) 03/05/2015  . S/P BKA (below knee amputation) (HCC) 11/23/2014  . Depression 11/11/2014  . Hypokalemia   . Peripheral vascular disease (HCC) 11/02/2014  . Wound, surgical, infected 11/01/2014  . Diabetic foot ulcer (HCC) 11/01/2014  . Trochanteric bursitis of left hip 05/29/2014  . Spastic hemiplegia affecting nondominant side (HCC) 12/02/2013  .  Chronic diastolic heart failure, grade 2 10/26/2013  . Dyslipidemia 10/26/2013  . CVA (cerebral infarction) 10/25/2013  . Arthralgia of hip 10/25/2013  . Anemia 05/17/2009  . Diabetes mellitus type 2 with peripheral artery disease (HCC) 05/08/2009  . Anxiety state 05/08/2009  . Essential hypertension 05/08/2009   Past Medical History:  Diagnosis Date  . Anemia   . Anxiety   . Bacteremia   . Bursitis    "left hip; sometimes left shoulder" (03/05/2015)  . Chest pain    Troponins up to 0.12 while hospitalized for E.coli PNA  . Depression   . Family history of adverse reaction to anesthesia    "brother died 07-02-13; woke up w/headache, vomited blood; drilled holes in his head; bleeding stroke; never came back" (03/05/2015)  . Gangrene of left foot (HCC)   . Headache    "when someone gets on my nerves" (03/05/2015)  . High cholesterol   . Hypertension   . Narcolepsy   . 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 Stroke [facility]) 10/2014   "weaker on left side since" (03/05/2015)  . Type II diabetes mellitus (HCC)   . Walking pneumonia 04/2009   "double" /notes 04/23/2011    Family History  Problem Relation Age of Onset  . CAD Mother   . CAD Father     Past Surgical History:  Procedure Laterality Date  . ABDOMINAL AORTAGRAM N/A 03/05/2015   Procedure: ABDOMINAL Ronny Flurry;  Surgeon: Chuck Hint, MD;  Location: [facility] CATH LAB;  Service: Cardiovascular;  Laterality: N/A;  . AMPUTATION Right 11/03/2014   Procedure: AMPUTATION BELOW KNEE;  Surgeon: Sherren Kerns, MD;  Location: [facility] OR;  Service: Vascular;  Laterality: Right;  . AMPUTATION Left 11/28/2017   Procedure: AMPUTATION ABOVE KNEE;  Surgeon: Nadara Mustard, MD;  Location: [facility] OR;  Service: Orthopedics;  Laterality: Left;  . APPLICATION OF WOUND VAC Left 11/28/2017   Procedure: APPLICATION OF WOUND VAC;  Surgeon: Nadara Mustard, MD;  Location: [facility] OR;  Service: Orthopedics;  Laterality: Left;  . FEMORAL-TIBIAL  BYPASS GRAFT Left 03/06/2015   Procedure: BYPASS GRAFT Left FEMORAL-POSTERIOR TIBIAL ARTERY with reversed saphenous vein;  Surgeon: Sherren Kerns, MD;  Location: [facility] OR;  Service: Vascular;  Laterality: Left;  . INTRAOPERATIVE ARTERIOGRAM Left 03/06/2015   Procedure: INTRA OPERATIVE ARTERIOGRAM;  Surgeon: Sherren Kerns, MD;  Location: [facility] OR;  Service: Vascular;  Laterality: Left;  . IR FLUORO GUIDE CV MIDLINE PICC RIGHT  07/29/2017  . IR US GUIDE VASC ACCESS RIGHT  07/29/2017  . IRRIGATION AND DEBRIDEMENT FOOT Left 12/05/2016   Procedure: IRRIGATION AND DEBRIDEMENT FOOT;  Surgeon: Gwyneth Revels, DPM;  Location: [facility] ORS;  Service: Podiatry;  Laterality: Left;  . LOWER EXTREMITY ANGIOGRAM N/A 10/03/2014   Procedure: LOWER EXTREMITY ANGIOGRAM;  Surgeon: Pamella Pert, MD;  Location: [facility] CATH LAB;  Service: Cardiovascular;  Laterality: N/A;  . PERIPHERAL VASCULAR CATHETERIZATION Left 12/03/2016   Procedure:  Lower Extremity Angiography;  Surgeon: Annice Needy, MD;  Location: [facility] INVASIVE CV LAB;  Service: Cardiovascular;  Laterality: Left;   Social History   Occupational History  . Not on file  Tobacco Use  . Smoking status: Former Smoker    Packs/day: 0.10    Years: 40.00    Pack years: 4.00    Types: Cigarettes    Last attempt to quit: 11/21/2016    Years since quitting: 1.3  . Smokeless tobacco: Never Used  Substance and Sexual Activity  . Alcohol use: Yes    Alcohol/week: 0.0 oz    Comment: 03/05/2015 "Might have a drink on holidays"  . Drug use: No  . Sexual activity: Not Currently

## 2018-06-28 IMAGING — MR MR PELVIS WO/W CM
4 of 8 series · 23 of 48 positions shown · IV contrast (multihance)
Comparison: None.

CLINICAL DATA: Fever with past medical history of chronic diastolic
heart failure, diabetes and right below-knee amputation. Peripheral
vascular disease. Complains of generalized body ache and chronic
right hip pain.

EXAM:
MRI PELVIS WITHOUT AND WITH CONTRAST
TECHNIQUE: Multiplanar multisequence MR imaging of the pelvis was performed
both before and after administration of intravenous contrast.
CONTRAST:  20mL MULTIHANCE GADOBENATE DIMEGLUMINE 529 MG/ML IV SOLN

[Series 4: T1 · axial · 8.0mm · 0.82mm/px · z∈[-89,+179]mm · 6 of 28 slices shown (1 of 2)]
[im 1/28]
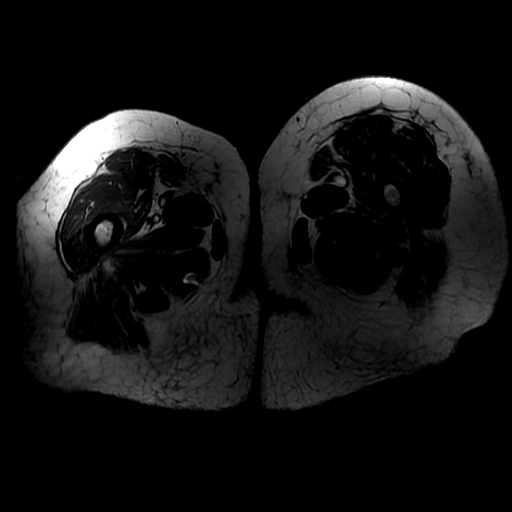
[im 6/28]
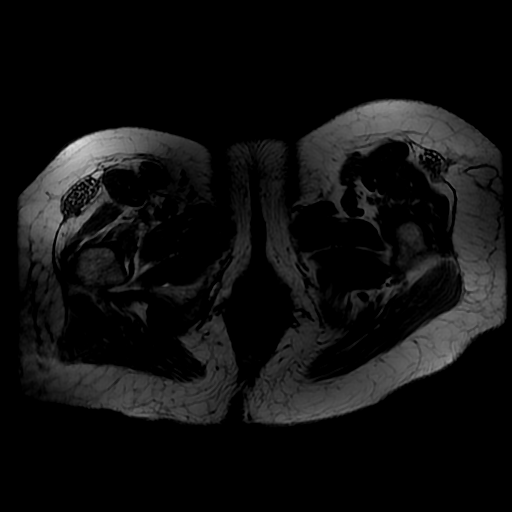
[im 11/28]
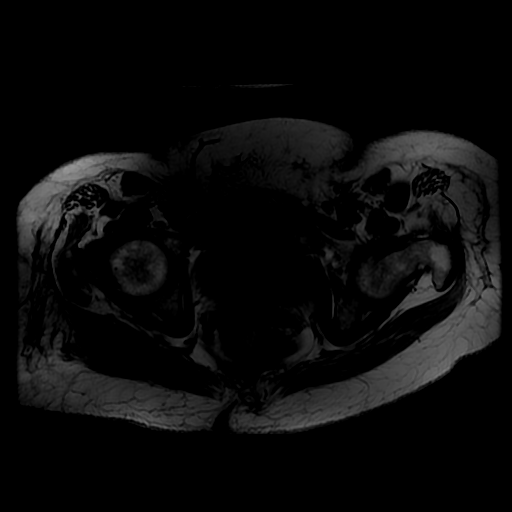
[im 17/28]
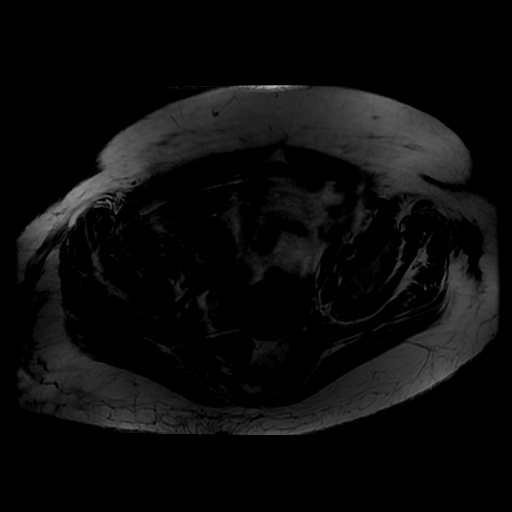
[im 22/28]
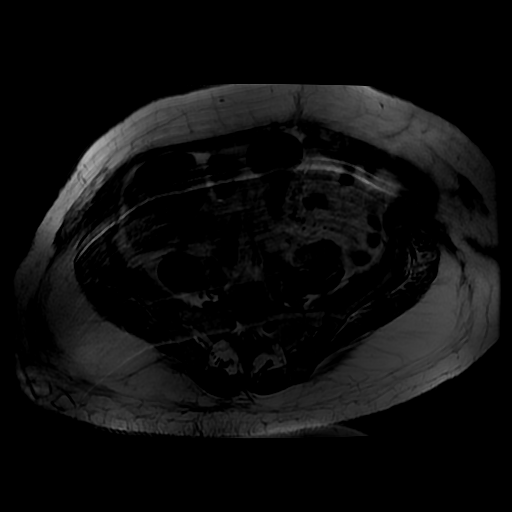
[im 28/28]
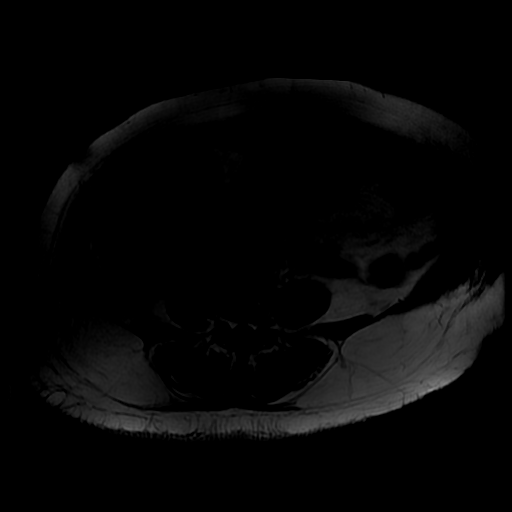

[Series 5: T2 fat-sat · axial · 8.0mm · 0.82mm/px · z∈[-89,+179]mm · 6 of 28 slices shown]
[im 1/28]
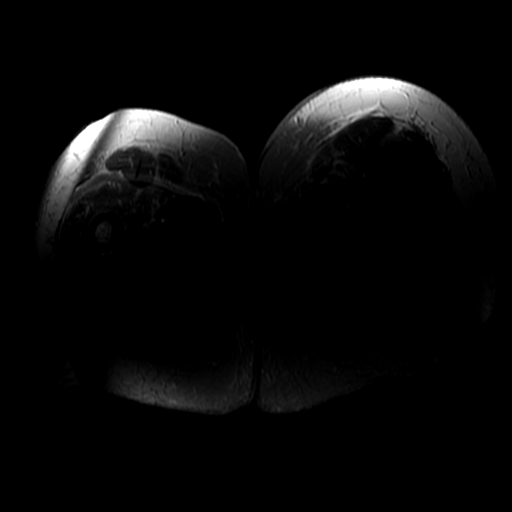
[im 6/28]
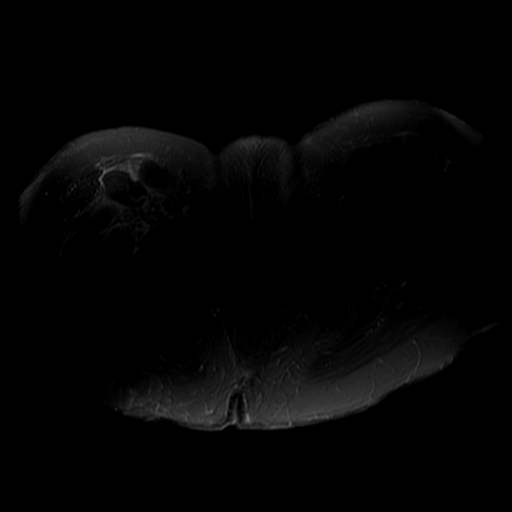
[im 11/28]
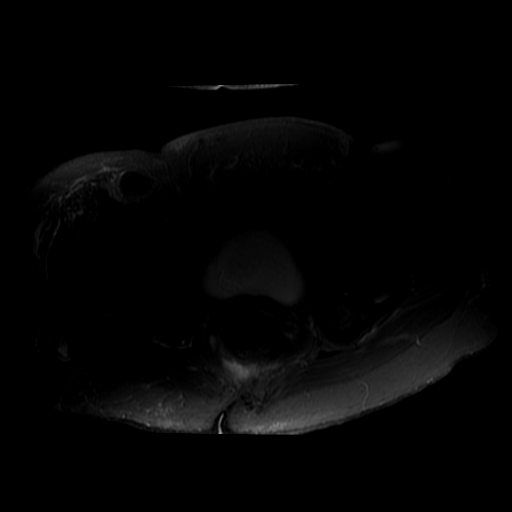
[im 17/28]
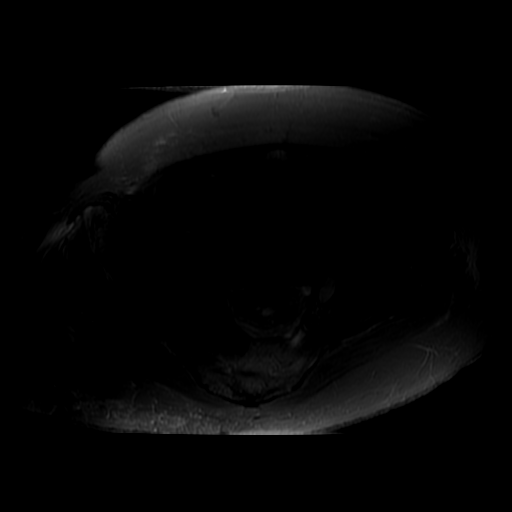
[im 22/28]
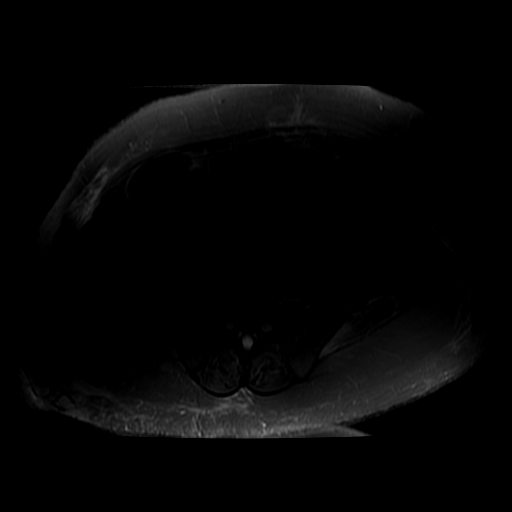
[im 28/28]
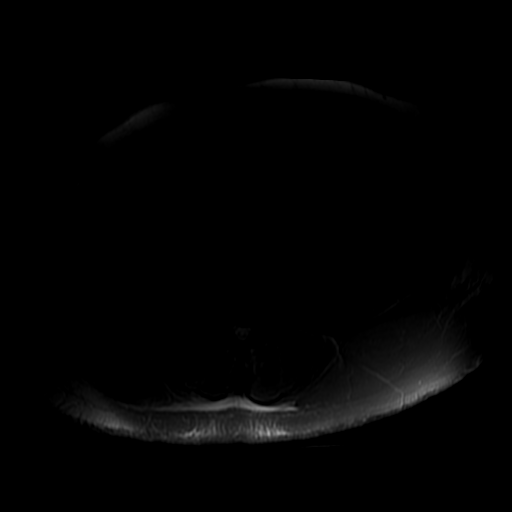

[Series 6: T1 · axial · non-contrast · 8.0mm · 1.64mm/px · z∈[-89,+179]mm · 6 of 28 slices shown (2 of 2)]
[im 1/28]
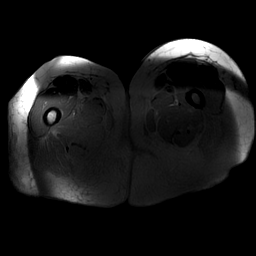
[im 6/28]
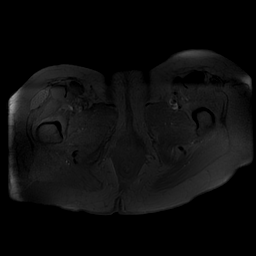
[im 11/28]
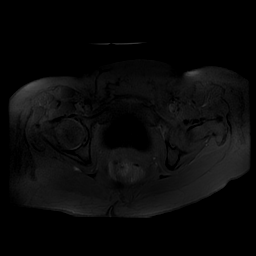
[im 17/28]
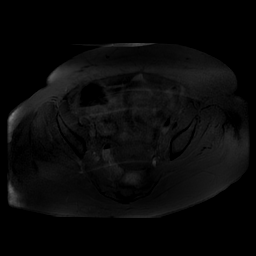
[im 22/28]
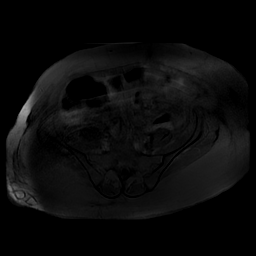
[im 28/28]
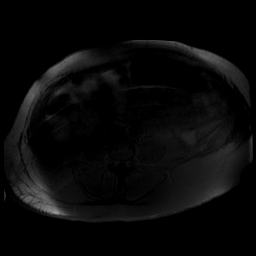

[Series 7: ax fse ir · axial · 8.0mm · 0.82mm/px · z∈[-88,+180]mm · 5 of 28 slices shown]
[im 1/28]
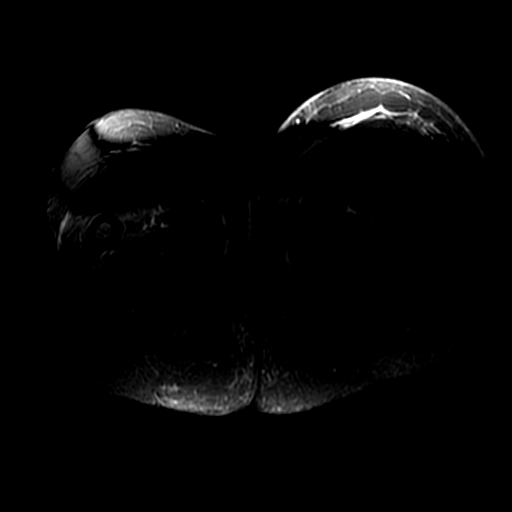
[im 6/28]
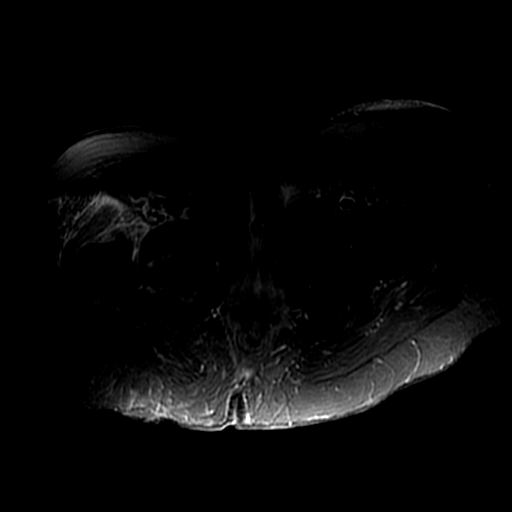
[im 11/28]
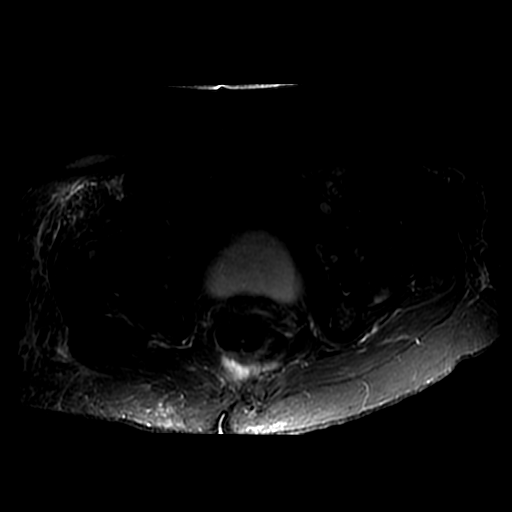
[im 17/28]
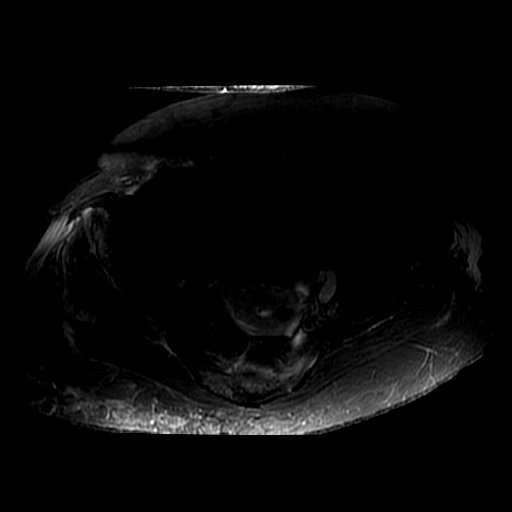
[im 28/28]
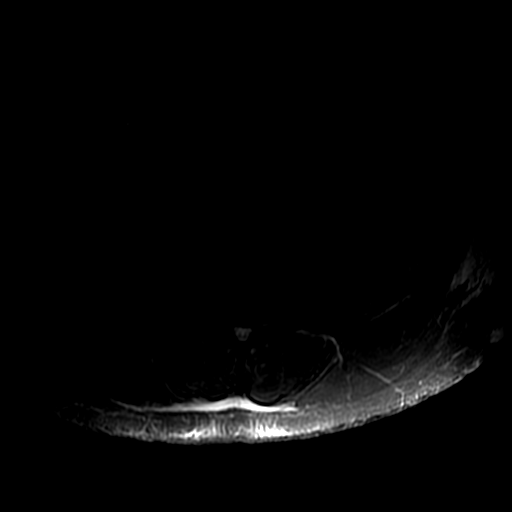

[23 of 48 positions shown; findings below may reference images not displayed]

FINDINGS: Urinary Tract: The bladder is physiologically distended without
intramural mass or calculus. No hydroureter.

Bowel:  Unremarkable visualized pelvic bowel loops.

Vascular/Lymphatic: No pathologically enlarged lymph nodes. No
significant vascular abnormality seen.

Reproductive:  The uterus and adnexa are unremarkable.

Other: Diffuse subcutaneous edema of the included lower abdomen and
pelvis likely representing mild anasarca.

Musculoskeletal: Intramuscular edema of the right gluteus minimus
and medius muscles consistent with myositis, less likely strain. No
hip joint effusion. No marrow signal abnormalities of the proximal
femora. The femoral heads are spherical and well seated within their
acetabular components. SI joints and pubic symphysis are intact. No
evidence of a stress or insufficiency fracture. No significant disc
desiccation of the included lower lumbar spine.
IMPRESSION: 1. Generalized soft tissue anasarca of the included abdomen and
pelvis.
2. Intramuscular edema noted of the right gluteus minimus and medius
muscles, raising concern for a myositis or possibly intramuscular
strain.
3. No abscess collections are identified.
4. No joint effusions.

## 2018-07-14 ENCOUNTER — Other Ambulatory Visit: Payer: Self-pay | Admitting: Internal Medicine

## 2018-07-14 DIAGNOSIS — R079 Chest pain, unspecified: Secondary | ICD-10-CM

## 2018-07-19 ENCOUNTER — Ambulatory Visit (HOSPITAL_COMMUNITY)
Admission: RE | Admit: 2018-07-19 | Discharge: 2018-07-19 | Disposition: A | Discharge status: Home / Self Care | Payer: Medicare Other | Source: Ambulatory Visit | Attending: Internal Medicine | Admitting: Internal Medicine

## 2018-07-19 DIAGNOSIS — I700 Atherosclerosis of aorta: Secondary | ICD-10-CM | POA: Insufficient documentation

## 2018-07-19 DIAGNOSIS — R079 Chest pain, unspecified: Secondary | ICD-10-CM | POA: Diagnosis not present

## 2018-07-19 MED ORDER — IOPAMIDOL (ISOVUE-370) INJECTION 76%
100.0000 mL | Freq: Once | INTRAVENOUS | Status: AC | PRN
  Administered 2018-07-19: 100 mL via INTRAVENOUS

## 2018-07-19 MED ORDER — IOPAMIDOL (ISOVUE-370) INJECTION 76%
INTRAVENOUS | Status: AC
  Filled 2018-07-19: qty 100
